# Patient Record
Sex: Female | Born: 1940 | Race: White | Hispanic: No | Smoking: Never smoker
Health system: Southern US, Community
[De-identification: ages and names within clinical notes are randomized; demographics above are authoritative.]

## PROBLEM LIST (undated history)

## (undated) DIAGNOSIS — R0600 Dyspnea, unspecified: Secondary | ICD-10-CM

## (undated) DIAGNOSIS — I2510 Atherosclerotic heart disease of native coronary artery without angina pectoris: Secondary | ICD-10-CM

## (undated) DIAGNOSIS — M1990 Unspecified osteoarthritis, unspecified site: Secondary | ICD-10-CM

## (undated) DIAGNOSIS — K7460 Unspecified cirrhosis of liver: Secondary | ICD-10-CM

## (undated) DIAGNOSIS — J479 Bronchiectasis, uncomplicated: Secondary | ICD-10-CM

## (undated) DIAGNOSIS — E039 Hypothyroidism, unspecified: Secondary | ICD-10-CM

## (undated) DIAGNOSIS — F418 Other specified anxiety disorders: Secondary | ICD-10-CM

## (undated) DIAGNOSIS — Z8639 Personal history of other endocrine, nutritional and metabolic disease: Secondary | ICD-10-CM

## (undated) DIAGNOSIS — R0609 Other forms of dyspnea: Secondary | ICD-10-CM

## (undated) DIAGNOSIS — H269 Unspecified cataract: Secondary | ICD-10-CM

## (undated) DIAGNOSIS — D689 Coagulation defect, unspecified: Secondary | ICD-10-CM

## (undated) DIAGNOSIS — I4891 Unspecified atrial fibrillation: Secondary | ICD-10-CM

## (undated) DIAGNOSIS — I10 Essential (primary) hypertension: Secondary | ICD-10-CM

## (undated) DIAGNOSIS — D500 Iron deficiency anemia secondary to blood loss (chronic): Secondary | ICD-10-CM

## (undated) DIAGNOSIS — K7581 Nonalcoholic steatohepatitis (NASH): Secondary | ICD-10-CM

## (undated) DIAGNOSIS — R58 Hemorrhage, not elsewhere classified: Secondary | ICD-10-CM

## (undated) DIAGNOSIS — Z7901 Long term (current) use of anticoagulants: Secondary | ICD-10-CM

## (undated) DIAGNOSIS — D071 Carcinoma in situ of vulva: Secondary | ICD-10-CM

## (undated) DIAGNOSIS — K5909 Other constipation: Secondary | ICD-10-CM

## (undated) DIAGNOSIS — Z853 Personal history of malignant neoplasm of breast: Secondary | ICD-10-CM

## (undated) DIAGNOSIS — E890 Postprocedural hypothyroidism: Secondary | ICD-10-CM

## (undated) DIAGNOSIS — I480 Paroxysmal atrial fibrillation: Secondary | ICD-10-CM

## (undated) DIAGNOSIS — Z9889 Other specified postprocedural states: Secondary | ICD-10-CM

## (undated) DIAGNOSIS — M810 Age-related osteoporosis without current pathological fracture: Secondary | ICD-10-CM

## (undated) DIAGNOSIS — R112 Nausea with vomiting, unspecified: Secondary | ICD-10-CM

## (undated) DIAGNOSIS — D860 Sarcoidosis of lung: Secondary | ICD-10-CM

## (undated) DIAGNOSIS — D696 Thrombocytopenia, unspecified: Secondary | ICD-10-CM

## (undated) DIAGNOSIS — E119 Type 2 diabetes mellitus without complications: Secondary | ICD-10-CM

## (undated) DIAGNOSIS — C801 Malignant (primary) neoplasm, unspecified: Secondary | ICD-10-CM

## (undated) DIAGNOSIS — E785 Hyperlipidemia, unspecified: Secondary | ICD-10-CM

## (undated) HISTORY — DX: Unspecified cirrhosis of liver: K74.60

## (undated) HISTORY — PX: BREAST SURGERY: SHX581

## (undated) HISTORY — DX: Unspecified cataract: H26.9

## (undated) HISTORY — PX: CHOLECYSTECTOMY: SHX55

## (undated) HISTORY — DX: Essential (primary) hypertension: I10

## (undated) HISTORY — DX: Type 2 diabetes mellitus without complications: E11.9

## (undated) HISTORY — DX: Hypothyroidism, unspecified: E03.9

## (undated) HISTORY — DX: Coagulation defect, unspecified: D68.9

## (undated) HISTORY — DX: Atherosclerotic heart disease of native coronary artery without angina pectoris: I25.10

## (undated) HISTORY — DX: Unspecified atrial fibrillation: I48.91

## (undated) HISTORY — PX: APPENDECTOMY: SHX54

## (undated) HISTORY — DX: Hyperlipidemia, unspecified: E78.5

## (undated) HISTORY — DX: Age-related osteoporosis without current pathological fracture: M81.0

## (undated) HISTORY — DX: Other specified anxiety disorders: F41.8

## (undated) HISTORY — PX: EYE SURGERY: SHX253

## (undated) HISTORY — DX: Hemorrhage, not elsewhere classified: R58

## (undated) HISTORY — PX: COLONOSCOPY WITH ESOPHAGOGASTRODUODENOSCOPY (EGD): SHX5779

## (undated) HISTORY — DX: Malignant (primary) neoplasm, unspecified: C80.1

## (undated) HISTORY — PX: PARTIAL MASTECTOMY WITH AXILLARY SENTINEL LYMPH NODE BIOPSY: SHX6004

## (undated) HISTORY — DX: Nonalcoholic steatohepatitis (NASH): K75.81

## (undated) HISTORY — PX: TUBAL LIGATION: SHX77

---
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
## 1963-12-09 DIAGNOSIS — D869 Sarcoidosis, unspecified: Secondary | ICD-10-CM | POA: Diagnosis present

## 1963-12-09 HISTORY — DX: Sarcoidosis, unspecified: D86.9

## 1985-12-08 HISTORY — PX: CHOLECYSTECTOMY OPEN: SUR202

## 1987-12-09 HISTORY — PX: ABDOMINAL HYSTERECTOMY: SHX81

## 2005-08-27 DIAGNOSIS — I209 Angina pectoris, unspecified: Secondary | ICD-10-CM | POA: Insufficient documentation

## 2005-08-27 DIAGNOSIS — E119 Type 2 diabetes mellitus without complications: Secondary | ICD-10-CM

## 2005-08-27 HISTORY — DX: Type 2 diabetes mellitus without complications: E11.9

## 2007-12-09 HISTORY — PX: LUMBAR LAMINECTOMY: SHX95

## 2011-12-09 DIAGNOSIS — Z923 Personal history of irradiation: Secondary | ICD-10-CM

## 2011-12-09 DIAGNOSIS — C50919 Malignant neoplasm of unspecified site of unspecified female breast: Secondary | ICD-10-CM

## 2011-12-09 DIAGNOSIS — C801 Malignant (primary) neoplasm, unspecified: Secondary | ICD-10-CM

## 2011-12-09 HISTORY — DX: Personal history of irradiation: Z92.3

## 2011-12-09 HISTORY — DX: Malignant (primary) neoplasm, unspecified: C80.1

## 2011-12-09 HISTORY — DX: Malignant neoplasm of unspecified site of unspecified female breast: C50.919

## 2013-12-08 DIAGNOSIS — H269 Unspecified cataract: Secondary | ICD-10-CM

## 2013-12-08 HISTORY — DX: Unspecified cataract: H26.9

## 2014-05-25 DIAGNOSIS — E669 Obesity, unspecified: Secondary | ICD-10-CM | POA: Insufficient documentation

## 2014-12-08 HISTORY — PX: CATARACT EXTRACTION W/ INTRAOCULAR LENS  IMPLANT, BILATERAL: SHX1307

## 2014-12-22 DIAGNOSIS — E0500 Thyrotoxicosis with diffuse goiter without thyrotoxic crisis or storm: Secondary | ICD-10-CM | POA: Insufficient documentation

## 2015-12-09 DIAGNOSIS — Z8639 Personal history of other endocrine, nutritional and metabolic disease: Secondary | ICD-10-CM

## 2015-12-09 HISTORY — DX: Personal history of other endocrine, nutritional and metabolic disease: Z86.39

## 2020-06-14 ENCOUNTER — Emergency Department (HOSPITAL_COMMUNITY): Payer: Medicare Other

## 2020-06-14 ENCOUNTER — Other Ambulatory Visit: Payer: Self-pay

## 2020-06-14 ENCOUNTER — Encounter (HOSPITAL_COMMUNITY): Payer: Self-pay | Admitting: *Deleted

## 2020-06-14 ENCOUNTER — Emergency Department (HOSPITAL_COMMUNITY)
Admission: EM | Admit: 2020-06-14 | Discharge: 2020-06-14 | Disposition: A | Discharge status: Home / Self Care | Payer: Medicare Other | Attending: Emergency Medicine | Admitting: Emergency Medicine

## 2020-06-14 DIAGNOSIS — S61401A Unspecified open wound of right hand, initial encounter: Secondary | ICD-10-CM | POA: Insufficient documentation

## 2020-06-14 DIAGNOSIS — Y92009 Unspecified place in unspecified non-institutional (private) residence as the place of occurrence of the external cause: Secondary | ICD-10-CM | POA: Insufficient documentation

## 2020-06-14 DIAGNOSIS — Y999 Unspecified external cause status: Secondary | ICD-10-CM | POA: Diagnosis not present

## 2020-06-14 DIAGNOSIS — S61501A Unspecified open wound of right wrist, initial encounter: Secondary | ICD-10-CM | POA: Insufficient documentation

## 2020-06-14 DIAGNOSIS — Y939 Activity, unspecified: Secondary | ICD-10-CM | POA: Insufficient documentation

## 2020-06-14 DIAGNOSIS — Z5189 Encounter for other specified aftercare: Secondary | ICD-10-CM

## 2020-06-14 DIAGNOSIS — S6991XA Unspecified injury of right wrist, hand and finger(s), initial encounter: Secondary | ICD-10-CM | POA: Diagnosis present

## 2020-06-14 DIAGNOSIS — M25531 Pain in right wrist: Secondary | ICD-10-CM

## 2020-06-14 DIAGNOSIS — W01198A Fall on same level from slipping, tripping and stumbling with subsequent striking against other object, initial encounter: Secondary | ICD-10-CM | POA: Insufficient documentation

## 2020-06-14 NOTE — ED Triage Notes (Signed)
Wound to right hand from fall 3 weeks ago, here for evaluation

## 2020-06-14 NOTE — ED Provider Notes (Signed)
[facility] EMERGENCY DEPARTMENT Provider Note   CSN: [id number] Arrival date & time: 06/14/20  1342     History Chief Complaint  Patient presents with  . Wound Check    Tasha Roberts is a 79 y.o. female.  HPI   This patient is a pleasant 79 year old female, she is on Eliquis secondary to atrial fibrillation.  She presents to the hospital today for a recheck of her right hand wound.  This occurred approximately 3 weeks ago when she slipped and fell in her house, when her hand hit the carpet it tore the skin on the dorsum of the hand and wrist.  She was initially seen at the urgent care where they sent her to the emergency department, they were able to stop the bleeding and give the patient appropriate wound care instructions.  She is concerned because over the last 3 weeks this seems to be poorly healing, it still occasionally bleeds and feels uncomfortable when she extends and flexes her fingers.  There is no progressive swelling, there is no fevers, the bleeding is completely stopped, she has not been seen in orthopedics in follow-up (has had negative orthopedic x-rays prior to this), she has been applying sterile dressings daily but no other specific wound care.  She is now staying here with a family member and Novella Rob, she is planning on moving down from [place] where she currently lives.  T[place] up-to-date  Past Medical History:  Diagnosis Date  . Atrial fibrillation ([place])   . Cirrhosis ([place])     There are no problems to display for this patient.   Past Surgical History:  Procedure Laterality Date  . CHOLECYSTECTOMY       OB History   No obstetric history on file.     No family history on file.  Social History   Tobacco Use  . Smoking status: Never Smoker  . Smokeless tobacco: Never Used  Substance Use Topics  . Alcohol use: Not Currently  . Drug use: Not Currently    Home Medications Prior to Admission medications   Not on File    Allergies    Metformin  and related  Review of Systems   Review of Systems  Constitutional: Negative for fever.  Skin: Positive for wound.    Physical Exam Updated Vital Signs BP (!) 113/49   Pulse (!) 49   Temp 97.9 F (36.6 C)   Resp 16   SpO2 97%   Physical Exam Vitals and nursing note reviewed.  Constitutional:      Appearance: She is well-developed. She is not diaphoretic.  HENT:     Head: Normocephalic and atraumatic.  Eyes:     General:        Right eye: No discharge.        Left eye: No discharge.     Conjunctiva/sclera: Conjunctivae normal.  Pulmonary:     Effort: Pulmonary effort is normal. No respiratory distress.  Skin:    General: Skin is warm and dry.     Findings: No erythema or rash.     Comments: There is a healing wound on the dorsum of the right hand and wrist.  There is no signs of drainage or discharge, there is a black eschar on top.  There is no surrounding cellulitis induration or warmth.  She is able to range of motion her wrist somewhat but states it causes mild discomfort.  The fingers all appear ecchymotic  Neurological:     Mental Status: She is  alert.     Coordination: Coordination normal.     ED Results / Procedures / Treatments   Labs (all labs ordered are listed, but only abnormal results are displayed) Labs Reviewed - No data to display  EKG None  Radiology DG Wrist Complete Right  Result Date: 06/14/2020 CLINICAL DATA:  Pain following recent fall EXAM: RIGHT WRIST - COMPLETE 3+ VIEW COMPARISON:  None. FINDINGS: Frontal, oblique, and lateral views obtained. Bones are diffusely osteoporotic. No fracture or dislocation. There is narrowing of the scaphotrapezial joint. No erosive change. No soft tissue air. No bony destruction IMPRESSION: Osteoporosis. Narrowing scaphotrapezial joint. No fracture or dislocation. No bony destruction. Electronically Signed   By: Lowella Grip III M.D.   On: 06/14/2020 15:05   DG Hand Complete Right  Result Date:  06/14/2020 CLINICAL DATA:  Pain following recent fall EXAM: RIGHT HAND - COMPLETE 3+ VIEW COMPARISON:  None. FINDINGS: Frontal, oblique, and lateral views were obtained. Bones are osteoporotic. There is no fracture or dislocation. There is moderate narrowing of all PIP and DIP joints with spurring in multiple PIP and DIP joint regions. No erosive change. There is narrowing of the scaphotrapezial joint as well. IMPRESSION: Osteoporosis. No fracture or dislocation. 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 Electronically Signed   By: Lowella Grip III M.D.   On: 06/14/2020 15:04    Procedures Procedures (including critical care time)  Medications Ordered in ED Medications - No data to display  ED Course  I have reviewed the triage vital signs and the nursing notes.  Pertinent labs & imaging results that were available during my care of the patient were reviewed by me and considered in my medical decision making (see chart for details).    MDM Rules/Calculators/A&P                          The patient has a slowly healing wound, she is anticoagulated which is quite evident by the degree of bruising of the arm.  The only place where she has discomfort is over the wrist.  She had initial negative x-rays, will confirm no x-ray evidence of fracture today, as sometimes scaphoid injuries can be missed on initial imaging.  The patient is agreeable, she will need referral to wound care and local family doctors as she plans to move to this area.  Vital signs stable, patient well-appearing, no signs of infection.  xrays viewed and interpreted by myself - there is no fracture - just arthritis Wound care referral given, family doctor referral tgiven, pt agreeable and stable appearing.  Final Clinical Impression(s) / ED Diagnoses Final diagnoses:  Visit for wound check  Wrist pain, acute, right      Noemi Chapel, MD 06/14/20 [phone]

## 2020-06-14 NOTE — Discharge Instructions (Signed)
Tylenol or Ibuprofen for pain (preferrably tylenol) Your xrays show arthritis of your wrist and hand but no broken bones See the wound care clinic above - call to make appointment Keep the wounds clean and dry ER for increased pain / swelling / redness / fever Family doctor list below for local follow up  [facility] Primary Care Doctor List    Sinda Du MD. Specialty: Pulmonary Disease Contact information: [place]  [place] [address]  [phone]   Tula Nakayama, MD. Specialty: Family Medicine Contact information: [address] [place] [address]  [phone]   Sallee Lange, MD. Specialty: Family Medicine Contact information: [place]  [place] [address]  [phone]   Rosita Fire, MD Specialty: Internal Medicine Contact information: [place] [address]  [phone]   Delphina Cahill, MD. Specialty: Internal Medicine Contact information: [place] [address]  [phone]    [facility] (Dr. Maudie Mercury) Specialty: Family Medicine Contact information: [place] [address]  [phone]   Leslie Andrea, MD. Specialty: Family Medicine Contact information: [place] [address]  [phone]   Asencion Noble, MD. Specialty: Internal Medicine Contact information: [place] [address]  [place] [address]  [place]  [address] [place], [place] [address] [phone]  Services [facility] offers a variety of basic health services.  Services include but are not limited to: Blood pressure checks  Heart rate checks  Blood sugar checks  Urine analysis  Rapid strep tests  Pregnancy tests.  Health education and referrals  People needing more complex services will be directed to a physician online. Using  these virtual visits, doctors can evaluate and prescribe medicine and treatments. There will be no medication on-site, though [facility] will help patients fill their prescriptions at little to no cost.   For More information please go to: [email or web address]

## 2020-06-18 ENCOUNTER — Other Ambulatory Visit: Payer: Self-pay

## 2020-06-18 ENCOUNTER — Ambulatory Visit (HOSPITAL_COMMUNITY): Payer: Medicare Other | Attending: Physician Assistant | Admitting: Physical Therapy

## 2020-06-18 ENCOUNTER — Encounter (HOSPITAL_COMMUNITY): Payer: Self-pay | Admitting: Physical Therapy

## 2020-06-18 DIAGNOSIS — M79641 Pain in right hand: Secondary | ICD-10-CM | POA: Diagnosis present

## 2020-06-18 DIAGNOSIS — T8133XA Disruption of traumatic injury wound repair, initial encounter: Secondary | ICD-10-CM | POA: Diagnosis present

## 2020-06-18 NOTE — Therapy (Signed)
[place], [place], [address] Phone: [phone]   Fax:  [phone]  Wound Care Evaluation  Patient Details  Name: Tasha Roberts MRN: [id number] Date of Birth: 06-01-1941 Referring Provider (PT): Glo Herring, [place]   Encounter Date: 06/18/2020   PT End of Session - 06/18/20 1022    Visit Number 1    Number of Visits 12    Date for PT Re-Evaluation 07/30/20    Authorization Type Medicare, secondary - BCBS federal, VL 75 combined, no auth    Authorization - Visit Number 1    Authorization - Number of Visits 75    Progress Note Due on Visit 10    PT Start Time [phone]    PT Stop Time 1015    PT Time Calculation (min) 52 min    Activity Tolerance Patient tolerated treatment well    Behavior During Therapy [facility] for tasks assessed/performed           Past Medical History:  Diagnosis Date  . Atrial fibrillation ([place])   . Cirrhosis [facility])     Past Surgical History:  Procedure Laterality Date  . CHOLECYSTECTOMY      There were no vitals filed for this visit.     [facility] PT Assessment - 06/18/20 0001      Assessment   Medical Diagnosis wound of right hand    Referring Provider (PT) Glo Herring, PA    Onset Date/Surgical Date --   4 weeks ago   Hand Dominance Right    Prior Therapy no      Precautions   Precautions Fall      Balance Screen   Has the patient fallen in the past 6 months Yes    How many times? 1    Has the patient had a decrease in activity level because of a fear of falling?  Yes    Is the patient reluctant to leave their home because of a fear of falling?  No      Home Environment   Living Environment Private residence    Living Arrangements Children    Available Help at Discharge Family    Type of [place]      Prior Function   Level of [place] with household mobility with device;Independent with community mobility with device      Cognition   Overall Cognitive  Status Within Functional Limits for tasks assessed           Wound Therapy - 06/18/20 0001    Subjective Patient and daughter present Tasha Roberts). Patient reports reaching forward toward the ground to pick something up and she fell and her skin is so fragile her hand sustained an opened wound. Went to ED and they tried butterfly bandages and that didn't take. Reports they went back to ED as wound was not healing. Xrays did not show fracture. They have been changing bandage once a day and she is taking Tylenol (pain meds made her really sleepy).     Patient and Family Stated Goals to have wound heal and to be able to use assistive device    Date of Onset --   4 weeks ago   Prior Treatments neosporin, soap and water    Pain Scale 0-10    Pain Score 4     Pain Location Hand    Pain Orientation Right   dorsal   Pain Descriptors / Indicators Aching    Pain Onset With  Activity    Patients Stated Pain Goal 0    Pain Intervention(s) Repositioned    Multiple Pain Sites No    Evaluation and Treatment Procedures Explained to Patient/Family Yes    Evaluation and Treatment Procedures agreed to    Wound Properties Date First Assessed: 06/18/20 Time First Assessed: 0929 Wound Type: Laceration Location: Hand Location Orientation: Right;Other (Comment) Wound Description (Comments): dorsal aspect of hand Present on Admission: Yes   Dressing Type Gauze (Comment);Alginate;Compression wrap   with medihoney, finger and gauze wrap   Dressing Changed New    Dressing Status Old drainage    Dressing Change Frequency PRN    % Wound base Red or Granulating 15%    % Wound base Yellow/Fibrinous Exudate 65%    % Wound base Black/Eschar 20%    Peri-wound Assessment Edema   bruising in fingers and in entire hand   Wound Length (cm) 4.4 cm    Wound Width (cm) 4.5 cm    Wound Depth (cm) 0.6 cm   at 9 o'clock   Wound Volume (cm^3) 11.88 cm^3    Wound Surface Area (cm^2) 19.8 cm^2    Undermining (cm) at 9 o'clock     Margins Epibole (rolled edges)   at 9 o'clock   Drainage Amount Minimal    Drainage Description Sanguineous    Treatment Cleansed;Debridement (Selective)    Selective Debridement - Location right dorsal aspect of hand    Selective Debridement - Tools Used Forceps    Selective Debridement - Tissue Removed devitalized tissue    Wound Therapy - Clinical Statement Patient presents to clinic with right hand wound after falling forward onto her carpet 4 weeks ago. Patient has been to ED  twice and butterfly bandages did not help with wound closure. Swelling and bruising noted throughout right hand and fingers.  Patient is also on Eliquis for A-fib and reports mild to moderate drainage at wound site. Patient would greatly benefit from skilled physical therapy to promote wound healing, decrease risk of infection and allow patient to use required assistive devices safely at home. Used medihoney today to help actively debride wound and decrease sticking to dressing. Alginate was used to help with drainage. Light compression wrap used in fingers and hand to decrease swelling, if this is tolerated will consider light coban around hand to help with swelling.     Wound Therapy - Functional Problem List difficulty walkigna nd using walker/cane, performing daily tasks - right hand dominant    Factors Delaying/Impairing Wound Healing Diabetes Mellitus   on Eliquis, a-fib   Wound Therapy - Frequency 2X / week    Wound Therapy - Current Recommendations PT    Wound Plan continue with selective debridement - precaution- patient is on blood thinners- cleanse and dressing change    Dressing  medihoney on gauze, alginate for absorption, finger wraps, then gauze wrap around hand, netting to secure                  Objective measurements completed on examination: See above findings.             PT Education - 06/18/20 1043    Education Details patient and daughter educated in wound care, POC, compression  and how/when to remove dressings if need be.    Person(s) Educated Patient;Child(ren)    Methods Explanation    Comprehension Verbalized understanding            PT Short Term Goals - 06/18/20 1024  PT SHORT TERM GOAL #1   Title Swelling will be reduced in fingers by at least 50% per patient report to improve healing    Time 3    Period Weeks    Status New    Target Date 07/09/20      PT SHORT TERM GOAL #2   Title Wound will be 100% granulated to improve ability to heal    Time 3    Period Weeks    Status New    Target Date 07/09/20             PT Long Term Goals - 06/18/20 1025      PT LONG TERM GOAL #1   Title Wound will be completely healed to decrease risk of infection    Time 6    Period Weeks    Status New    Target Date 07/30/20      PT LONG TERM GOAL #2   Title Patient will be able to use cane in right hand to improve safety in home to decrease risk of falls.    Time 6    Period Weeks    Status New    Target Date 07/30/20                 Patient will benefit from skilled therapeutic intervention in order to improve the following deficits and impairments:     Visit Diagnosis: Traumatic wound dehiscence, initial encounter  Pain in right hand    Problem List There are no problems to display for this patient.  10:44 AM, 06/18/20 [name], DPT Physical Therapy with [facility]  [phone] office  [place] [address] [place], [place], [address] Phone: [phone]   Fax:  [phone]  Name: Tasha Roberts MRN: [id number] Date of Birth: [birth year]

## 2020-06-20 ENCOUNTER — Encounter (HOSPITAL_COMMUNITY): Payer: Self-pay | Admitting: Physical Therapy

## 2020-06-20 ENCOUNTER — Other Ambulatory Visit: Payer: Self-pay

## 2020-06-20 ENCOUNTER — Ambulatory Visit (HOSPITAL_COMMUNITY): Payer: Medicare Other | Admitting: Physical Therapy

## 2020-06-20 DIAGNOSIS — M79641 Pain in right hand: Secondary | ICD-10-CM

## 2020-06-20 DIAGNOSIS — T8133XA Disruption of traumatic injury wound repair, initial encounter: Secondary | ICD-10-CM | POA: Diagnosis not present

## 2020-06-20 NOTE — Therapy (Signed)
[place], [place], [address] Phone: [phone]   Fax:  [phone]  Wound Care Therapy  Patient Details  Name: Tasha Roberts MRN: [id number] Date of Birth: [birth year] Referring Provider (PT): Glo Herring, [place]   Encounter Date: 06/20/2020   PT End of Session - 06/20/20 1101    Visit Number 2    Number of Visits 12    Date for PT Re-Evaluation 07/30/20    Authorization Type Medicare, secondary - BCBS federal, VL 75 combined, no auth    Authorization - Visit Number 2    Authorization - Number of Visits 75    Progress Note Due on Visit 10    PT Start Time 0920    PT Stop Time 1000    PT Time Calculation (min) 40 min    Activity Tolerance Patient tolerated treatment well    Behavior During Therapy [facility] for tasks assessed/performed           Past Medical History:  Diagnosis Date  . Atrial fibrillation ([place])   . Cirrhosis [facility])     Past Surgical History:  Procedure Laterality Date  . CHOLECYSTECTOMY      There were no vitals filed for this visit.               Wound Therapy - 06/20/20 0001    Subjective Patient reports overall pain has been better since last session. States that she has had minor swelling in one area on her hand that started the day she left here. States occasional tingling at wound bend. Reports that they just noticed the bleeding at the bandage this morning.     Patient and Family Stated Goals to have wound heal and to be able to use assistive device    Prior Treatments neosporin, soap and water    Pain Scale 0-10    Pain Score 2     Pain Location Hand    Pain Orientation Right    Pain Descriptors / Indicators Aching    Pain Onset With Activity    Patients Stated Pain Goal 0    Pain Intervention(s) Repositioned    Multiple Pain Sites No    Evaluation and Treatment Procedures Explained to Patient/Family Yes    Evaluation and Treatment Procedures agreed to    Wound  Properties Date First Assessed: 06/18/20 Time First Assessed: 0929 Wound Type: Laceration Location: Hand Location Orientation: Right;Other (Comment) Wound Description (Comments): dorsal aspect of hand Present on Admission: Yes   Dressing Type Compression wrap;Gauze (Comment)   medihoney on gauze, then gauze wrap, coban and netting    Dressing Changed Changed    Dressing Status Old drainage;New drainage    Dressing Change Frequency PRN    Site / Wound Assessment Bleeding;Granulation tissue;Black;Pink;Yellow;Red    % Wound base Red or Granulating 20%    % Wound base Yellow/Fibrinous Exudate 40%    % Wound base Black/Eschar 15%    % Wound base Other/Granulation Tissue (Comment) 25%    Peri-wound Assessment Edema;Intact    Undermining (cm) at 9 o'clock    Margins Epibole (rolled edges)   at 9 o'clock, unattached elsewhere   Drainage Amount Minimal    Drainage Description Sanguineous;No odor    Treatment Cleansed;Debridement (Selective)    Selective Debridement - Location right dorsal aspect of hand    Selective Debridement - Tools Used Forceps    Selective Debridement - Tissue Removed devitalized tissue    Wound Therapy - Clinical  Statement Improvement in wound bed noted today with decreased necrotic tissue and slough. A couple dark/black spots in wound bed but overall wound is starting to improve in granulation tissue. Minor bleeding noted on bandage but overall drainage not excessive. Vaseline put on periwound to decrease likelihood of maceration. Medihoney helped decrease sticking to dressing. Added coban to hand to promote decrease in swelling (most swelling noted at hand right below ring finger and this started to appear immediately after gauze added). Wrapped ring finger secondary to location of swelling, otherwise swelling in fingers significantly reduced compared to last session. Educated patient in elevation of hand to help promote reduction of swelling.     Wound Therapy - Functional  Problem List difficulty walking and using walker/cane, performing daily tasks - right hand dominant    Factors Delaying/Impairing Wound Healing Diabetes Mellitus;Other (comment)   on blood thinners, a fib   Wound Therapy - Frequency 2X / week    Wound Therapy - Current Recommendations PT    Dressing  medihoney on gauze, alginate for absorption, finger wrap on right ring finger, then gauze wrap around hand, coban around hand for light compression, netting to secure, Vaseline around wound bed                   PT Education - 06/20/20 1134    Education Details in elevation of hand, in how to remove dressing if compression is too much    Person(s) Educated Patient    Methods Explanation    Comprehension Verbalized understanding            PT Short Term Goals - 06/18/20 1024      PT SHORT TERM GOAL #1   Title Swelling will be reduced in fingers by at least 50% per patient report to improve healing    Time 3    Period Weeks    Status New    Target Date 07/09/20      PT SHORT TERM GOAL #2   Title Wound will be 100% granulated to improve ability to heal    Time 3    Period Weeks    Status New    Target Date 07/09/20             PT Long Term Goals - 06/18/20 1025      PT LONG TERM GOAL #1   Title Wound will be completely healed to decrease risk of infection    Time 6    Period Weeks    Status New    Target Date 07/30/20      PT LONG TERM GOAL #2   Title Patient will be able to use cane in right hand to improve safety in home to decrease risk of falls.    Time 6    Period Weeks    Status New    Target Date 07/30/20                 Plan - 06/20/20 1134    Clinical Impression Statement see above    Personal Factors and Comorbidities Age;Comorbidity 1;Comorbidity 2    Comorbidities afib, on blood thinners, DB    Examination-Activity Limitations Bathing;Sleep;Caring for Others;Dressing;Hygiene/Grooming;Toileting;Lift;Locomotion Level     Examination-Participation Restrictions Laundry;Meal Prep;Cleaning    Stability/Clinical Decision Making Stable/Uncomplicated    Rehab Potential Good    PT Frequency 2x / week    PT Duration 6 weeks    PT Treatment/Interventions Aquatic Therapy;Cryotherapy;Electrical Stimulation;Therapeutic exercise;Therapeutic activities;Patient/family education;Dry needling;Compression bandaging;Neuromuscular re-education;Traction;ADLs/Self Care  Home Management;Functional mobility training    PT Next Visit Plan continue with dressing changes    Consulted and Agree with Plan of Care Patient;Family member/caregiver    Family Member Consulted daughter           Patient will benefit from skilled therapeutic intervention in order to improve the following deficits and impairments:  Pain, Decreased strength, Decreased range of motion, Increased edema, Decreased skin integrity, Decreased scar mobility  Visit Diagnosis: Traumatic wound dehiscence, initial encounter  Pain in right hand     Problem List There are no problems to display for this patient.  11:35 AM, 06/20/20 [name], DPT Physical Therapy with [facility]  [phone] office  [place] [address] [place], [place], [address] Phone: [phone]   Fax:  [phone]  Name: Tasha Roberts MRN: [id number] Date of Birth: 12-Mar-1941

## 2020-06-20 NOTE — Addendum Note (Signed)
Addended by: [name] R on: 06/20/2020 11:07 AM   Modules accepted: Orders

## 2020-06-22 ENCOUNTER — Encounter (HOSPITAL_COMMUNITY): Payer: Self-pay | Admitting: Physical Therapy

## 2020-06-22 ENCOUNTER — Ambulatory Visit (HOSPITAL_COMMUNITY): Payer: Medicare Other | Admitting: Physical Therapy

## 2020-06-22 ENCOUNTER — Other Ambulatory Visit: Payer: Self-pay

## 2020-06-22 DIAGNOSIS — T8133XA Disruption of traumatic injury wound repair, initial encounter: Secondary | ICD-10-CM

## 2020-06-22 DIAGNOSIS — M79641 Pain in right hand: Secondary | ICD-10-CM

## 2020-06-22 NOTE — Therapy (Signed)
[place], [place], [address] Phone: [phone]   Fax:  [phone]  Wound Care Therapy  Patient Details  Name: Tasha Roberts MRN: [id number] Date of Birth: 1941/01/12 Referring Provider (PT): Glo Herring, [place]   Encounter Date: 06/22/2020   PT End of Session - 06/22/20 1632    Visit Number 3    Number of Visits 12    Date for PT Re-Evaluation 07/30/20    Authorization Type Medicare, secondary - BCBS federal, VL 75 combined, no auth    Authorization - Visit Number 3    Authorization - Number of Visits 75    Progress Note Due on Visit 10    PT Start Time 1530    PT Stop Time 1610    PT Time Calculation (min) 40 min           Past Medical History:  Diagnosis Date  . Atrial fibrillation ([place])   . Cirrhosis [facility])     Past Surgical History:  Procedure Laterality Date  . CHOLECYSTECTOMY      There were no vitals filed for this visit.               Wound Therapy - 06/22/20 0001    Subjective PT states that her fingers that were not bandaged has had increased edema and she noticed that she has increased drainage so she as concerned.     Patient and Family Stated Goals to have wound heal and to be able to use assistive device    Prior Treatments neosporin, soap and water    Pain Scale 0-10    Pain Score 4     Pain Location Hand    Pain Orientation Right    Pain Descriptors / Indicators Aching    Pain Onset With Activity    Patients Stated Pain Goal 0    Pain Intervention(s) Emotional support    Multiple Pain Sites No    Evaluation and Treatment Procedures Explained to Patient/Family Yes    Evaluation and Treatment Procedures agreed to    Wound Properties Date First Assessed: 06/18/20 Time First Assessed: 0929 Wound Type: Laceration Location: Hand Location Orientation: Right;Other (Comment) Wound Description (Comments): dorsal aspect of hand Present on Admission: Yes   Dressing Type Compression  wrap    Dressing Changed Changed    Dressing Status Old drainage    Dressing Change Frequency PRN    Site / Wound Assessment Granulation tissue;Painful;Yellow    % Wound base Red or Granulating 70%    % Wound base Yellow/Fibrinous Exudate 30%    Peri-wound Assessment Edema    Drainage Amount Minimal    Drainage Description Purulent;No odor    Treatment Cleansed;Debridement (Selective)    Selective Debridement - Location right dorsal aspect of hand    Selective Debridement - Tools Used Forceps    Selective Debridement - Tissue Removed devitalized tissue    Wound Therapy - Clinical Statement Pt concerned about purulent drainage however wound has significant improved granulation.  Explained to pt that as the slough comes off the wound it has to go somewhere and that is what she is seeing, there is not redness, heat or significant pain to indicate any type of infection.  Noted increased edema in fingers that were not bandaged this treatment.  Pt concerned about getting wound dirty as she states that she is very Rt hand dominant.  Therapist changed dressing to finger bandaging, 1/4 " foam and 4cm short stretch  bandaging to both control edema and cover wound.     Wound Therapy - Functional Problem List difficulty walking and using walker/cane, performing daily tasks - right hand dominant    Factors Delaying/Impairing Wound Healing Diabetes Mellitus;Other (comment)   on blood thinners, a fib   Wound Therapy - Frequency 2X / week    Wound Therapy - Current Recommendations PT    Dressing  xeroform, alginate , foam , finger bandaging and short stretch bandaging.                      PT Short Term Goals - 06/18/20 1024      PT SHORT TERM GOAL #1   Title Swelling will be reduced in fingers by at least 50% per patient report to improve healing    Time 3    Period Weeks    Status New    Target Date 07/09/20      PT SHORT TERM GOAL #2   Title Wound will be 100% granulated to improve  ability to heal    Time 3    Period Weeks    Status New    Target Date 07/09/20             PT Long Term Goals - 06/18/20 1025      PT LONG TERM GOAL #1   Title Wound will be completely healed to decrease risk of infection    Time 6    Period Weeks    Status New    Target Date 07/30/20      PT LONG TERM GOAL #2   Title Patient will be able to use cane in right hand to improve safety in home to decrease risk of falls.    Time 6    Period Weeks    Status New    Target Date 07/30/20                 Plan - 06/22/20 1633    Clinical Impression Statement see above    Personal Factors and Comorbidities Age;Comorbidity 1;Comorbidity 2    Comorbidities afib, on blood thinners, DB    Examination-Activity Limitations Bathing;Sleep;Caring for Others;Dressing;Hygiene/Grooming;Toileting;Lift;Locomotion Level    Examination-Participation Restrictions Laundry;Meal Prep;Cleaning    Stability/Clinical Decision Making Stable/Uncomplicated    Rehab Potential Good    PT Frequency 2x / week    PT Duration 6 weeks    PT Treatment/Interventions Aquatic Therapy;Cryotherapy;Electrical Stimulation;Therapeutic exercise;Therapeutic activities;Patient/family education;Dry needling;Compression bandaging;Neuromuscular re-education;Traction;ADLs/Self Care Home Management;Functional mobility training    PT Next Visit Plan continue with dressing changes    Consulted and Agree with Plan of Care Patient;Family member/caregiver    Family Member Consulted daughter           Patient will benefit from skilled therapeutic intervention in order to improve the following deficits and impairments:  Pain, Decreased strength, Decreased range of motion, Increased edema, Decreased skin integrity, Decreased scar mobility  Visit Diagnosis: Traumatic wound dehiscence, initial encounter  Pain in right hand     Problem List There are no problems to display for this patient.   [name], PT  CLT [phone] 06/22/2020, 4:34 PM  [place] [address] [place], [place], [address] Phone: [phone]   Fax:  [phone]  Name: Tasha Roberts MRN: [id number] Date of Birth: 1941-04-11

## 2020-06-26 ENCOUNTER — Encounter (HOSPITAL_COMMUNITY): Payer: Self-pay | Admitting: Physical Therapy

## 2020-06-26 ENCOUNTER — Other Ambulatory Visit: Payer: Self-pay

## 2020-06-26 ENCOUNTER — Ambulatory Visit (HOSPITAL_COMMUNITY): Payer: Medicare Other | Admitting: Physical Therapy

## 2020-06-26 DIAGNOSIS — M79641 Pain in right hand: Secondary | ICD-10-CM

## 2020-06-26 DIAGNOSIS — T8133XA Disruption of traumatic injury wound repair, initial encounter: Secondary | ICD-10-CM | POA: Diagnosis not present

## 2020-06-26 NOTE — Therapy (Signed)
[place], [place], [address] Phone: [phone]   Fax:  [phone]  Wound Care Therapy  Patient Details  Name: Tasha Roberts MRN: [id number] Date of Birth: 10-Jan-1941 Referring Provider (PT): Glo Herring, [place]   Encounter Date: 06/26/2020   PT End of Session - 06/26/20 1007    Visit Number 4    Number of Visits 12    Date for PT Re-Evaluation 07/30/20    Authorization Type Medicare, secondary - BCBS federal, VL 75 combined, no auth    Authorization - Visit Number 4    Authorization - Number of Visits 75    Progress Note Due on Visit 10    PT Start Time 1003    PT Stop Time 1043    PT Time Calculation (min) 40 min           Past Medical History:  Diagnosis Date  . Atrial fibrillation ([place])   . Cirrhosis [facility])     Past Surgical History:  Procedure Laterality Date  . CHOLECYSTECTOMY      There were no vitals filed for this visit.      [facility] PT Assessment - 06/26/20 0001      Assessment   Medical Diagnosis wound of right hand    Referring Provider (PT) Glo Herring, PA    Hand Dominance Right                   Wound Therapy - 06/26/20 0001    Subjective States that her hand hasn't been very painful just in her fingers and just when she tries to move her fingers but overall her pain is much better and the swelling is much better.     Patient and Family Stated Goals to have wound heal and to be able to use assistive device    Prior Treatments neosporin, soap and water    Pain Scale 0-10    Pain Score 2     Pain Location Hand    Pain Descriptors / Indicators Aching    Pain Onset With Activity    Pain Intervention(s) Emotional support    Multiple Pain Sites No    Evaluation and Treatment Procedures Explained to Patient/Family Yes    Evaluation and Treatment Procedures agreed to    Wound Properties Date First Assessed: 06/18/20 Time First Assessed: 0929 Wound Type: Laceration Location:  Hand Location Orientation: Right;Other (Comment) Wound Description (Comments): dorsal aspect of hand Present on Admission: Yes   Dressing Type Foam - Lift dressing to assess site every shift;Compression wrap;Impregnated gauze (bismuth);Gauze (Comment);Alginate    Dressing Changed Changed    Dressing Status Old drainage    Dressing Change Frequency PRN    Site / Wound Assessment Granulation tissue;Pink    % Wound base Red or Granulating 95%    % Wound base Yellow/Fibrinous Exudate 5%    Peri-wound Assessment Edema;Maceration;Pink    Wound Length (cm) 3.6 cm   was 4.4   Wound Width (cm) 3 cm   was 4.5   Wound Depth (cm) 0 cm   .6   Wound Volume (cm^3) 0 cm^3    Wound Surface Area (cm^2) 10.8 cm^2    Margins Unattached edges (unapproximated)    Drainage Amount Minimal    Drainage Description Serosanguineous    Treatment Cleansed;Debridement (Selective)    Selective Debridement - Location right dorsal aspect of hand    Selective Debridement - Tools Used Forceps    Selective Debridement -  Tissue Removed devitalized tissue    Wound Therapy - Clinical Statement Wound improved in granulation tissue depth and overall size compared to last session and last measurement. Minimal drainage noted but continued swelling noted. Continued with xeroform, foam, gauze and light compression and finger wraps. Decreased pain noted during debridement and after dressing changed will continue with current POC.     Wound Therapy - Functional Problem List difficulty walking and using walker/cane, performing daily tasks - right hand dominant    Factors Delaying/Impairing Wound Healing Diabetes Mellitus;Other (comment)    Wound Therapy - Frequency 2X / week    Wound Therapy - Current Recommendations PT    Wound Plan continue with selective debridement - precaution- patient is on blood thinners- cleanse and dressing change    Dressing  xeroform, alginate , foam , finger bandaging and short stretch bandaging.                        PT Short Term Goals - 06/18/20 1024      PT SHORT TERM GOAL #1   Title Swelling will be reduced in fingers by at least 50% per patient report to improve healing    Time 3    Period Weeks    Status New    Target Date 07/09/20      PT SHORT TERM GOAL #2   Title Wound will be 100% granulated to improve ability to heal    Time 3    Period Weeks    Status New    Target Date 07/09/20             PT Long Term Goals - 06/18/20 1025      PT LONG TERM GOAL #1   Title Wound will be completely healed to decrease risk of infection    Time 6    Period Weeks    Status New    Target Date 07/30/20      PT LONG TERM GOAL #2   Title Patient will be able to use cane in right hand to improve safety in home to decrease risk of falls.    Time 6    Period Weeks    Status New    Target Date 07/30/20                  Patient will benefit from skilled therapeutic intervention in order to improve the following deficits and impairments:     Visit Diagnosis: Traumatic wound dehiscence, initial encounter  Pain in right hand     Problem List There are no problems to display for this patient.  11:20 AM, 06/26/20 [name], DPT Physical Therapy with [facility]  [phone] office  [place] [address] [place], [place], [address] Phone: [phone]   Fax:  [phone]  Name: Carrissa Taitano MRN: [id number] Date of Birth: [birth year]

## 2020-06-27 ENCOUNTER — Ambulatory Visit (HOSPITAL_COMMUNITY): Payer: Medicare Other | Admitting: Physical Therapy

## 2020-06-28 ENCOUNTER — Other Ambulatory Visit: Payer: Self-pay

## 2020-06-28 ENCOUNTER — Ambulatory Visit (HOSPITAL_COMMUNITY): Payer: Medicare Other | Admitting: Physical Therapy

## 2020-06-28 DIAGNOSIS — M79641 Pain in right hand: Secondary | ICD-10-CM

## 2020-06-28 DIAGNOSIS — T8133XA Disruption of traumatic injury wound repair, initial encounter: Secondary | ICD-10-CM | POA: Diagnosis not present

## 2020-06-28 NOTE — Therapy (Signed)
[place], [place], [address] Phone: [phone]   Fax:  [phone]  Wound Care Therapy  Patient Details  Name: Tasha Roberts MRN: [id number] Date of Birth: Aug 18, 1941 Referring Provider (PT): Glo Herring, [place]   Encounter Date: 06/28/2020   PT End of Session - 06/28/20 1221    Visit Number 5    Number of Visits 12    Date for PT Re-Evaluation 07/30/20    Authorization Type Medicare, secondary - BCBS federal, VL 75 combined, no auth    Authorization - Visit Number 5    Authorization - Number of Visits 75    Progress Note Due on Visit 10    PT Start Time 1140    PT Stop Time 1208    PT Time Calculation (min) 28 min    Activity Tolerance Patient tolerated treatment well    Behavior During Therapy [facility] for tasks assessed/performed           Past Medical History:  Diagnosis Date  . Atrial fibrillation ([place])   . Cirrhosis [facility])     Past Surgical History:  Procedure Laterality Date  . CHOLECYSTECTOMY      There were no vitals filed for this visit.               Wound Therapy - 06/28/20 0001    Subjective PT states that her hand doesn't even hurt anylonger.     Patient and Family Stated Goals to have wound heal and to be able to use assistive device    Prior Treatments neosporin, soap and water    Pain Scale 0-10    Pain Score 0-No pain    Evaluation and Treatment Procedures Explained to Patient/Family Yes    Evaluation and Treatment Procedures agreed to    Wound Properties Date First Assessed: 06/18/20 Time First Assessed: 0929 Wound Type: Laceration Location: Hand Location Orientation: Right;Other (Comment) Wound Description (Comments): dorsal aspect of hand Present on Admission: Yes   Dressing Type Alginate;Compression wrap;Impregnated gauze (bismuth)    Dressing Changed Changed    Dressing Status Old drainage    Dressing Change Frequency PRN    Site / Wound Assessment Clean;Red    % Wound  base Red or Granulating 95%    % Wound base Yellow/Fibrinous Exudate 5%    Margins Epibole (rolled edges)    Drainage Amount Minimal    Drainage Description Serosanguineous    Treatment Cleansed;Debridement (Selective)    Selective Debridement - Location right dorsal aspect of hand    Selective Debridement - Tools Used Forceps    Selective Debridement - Tissue Removed biofilm and dead skin    Wound Therapy - Clinical Statement Wound continues to improve, alginate was dry therefor discontinued.  Edema in hand has decreased therefore trial of discontinuing both the finger wraps and the compression dressing.     Wound Therapy - Functional Problem List difficulty walking and using walker/cane, performing daily tasks - right hand dominant    Factors Delaying/Impairing Wound Healing Diabetes Mellitus;Other (comment)    Wound Therapy - Frequency 2X / week    Wound Therapy - Current Recommendations PT    Wound Plan continue with selective debridement; gentle debriding of epiboled edges  - precaution- patient is on blood thinners- cleanse and dressing change    Dressing  xeroform, 4x4, 3"kling and netting                     PT  Short Term Goals - 06/28/20 1222      PT SHORT TERM GOAL #1   Title Swelling will be reduced in fingers by at least 50% per patient report to improve healing    Time 3    Period Weeks    Status Achieved    Target Date 07/09/20      PT SHORT TERM GOAL #2   Title Wound will be 100% granulated to improve ability to heal    Time 3    Period Weeks    Status On-going    Target Date 07/09/20             PT Long Term Goals - 06/28/20 1222      PT LONG TERM GOAL #1   Title Wound will be completely healed to decrease risk of infection    Time 6    Period Weeks    Status On-going      PT LONG TERM GOAL #2   Title Patient will be able to use cane in right hand to improve safety in home to decrease risk of falls.    Time 6    Period Weeks    Status  On-going                 Plan - 06/28/20 1221    Clinical Impression Statement see above    Personal Factors and Comorbidities Age;Comorbidity 1;Comorbidity 2    Comorbidities afib, on blood thinners, DB    Examination-Activity Limitations Bathing;Sleep;Caring for Others;Dressing;Hygiene/Grooming;Toileting;Lift;Locomotion Level    Examination-Participation Restrictions Laundry;Meal Prep;Cleaning    Stability/Clinical Decision Making Stable/Uncomplicated    Rehab Potential Good    PT Frequency 2x / week    PT Duration 6 weeks    PT Treatment/Interventions Aquatic Therapy;Cryotherapy;Electrical Stimulation;Therapeutic exercise;Therapeutic activities;Patient/family education;Dry needling;Compression bandaging;Neuromuscular re-education;Traction;ADLs/Self Care Home Management;Functional mobility training    PT Next Visit Plan continue with debridement and dressing changes    Consulted and Agree with Plan of Care Patient;Family member/caregiver    Family Member Consulted daughter           Patient will benefit from skilled therapeutic intervention in order to improve the following deficits and impairments:  Pain, Decreased strength, Decreased range of motion, Increased edema, Decreased skin integrity, Decreased scar mobility  Visit Diagnosis: Pain in right hand  Traumatic wound dehiscence, initial encounter     Problem List There are no problems to display for this patient.   Tasha Roberts, PT CLT [phone] 06/28/2020, 12:22 PM  [place] [address] [place], [place], [address] Phone: [phone]   Fax:  [phone]  Name: Tasha Roberts MRN: [id number] Date of Birth: June 09, 1941

## 2020-07-04 ENCOUNTER — Other Ambulatory Visit: Payer: Self-pay

## 2020-07-04 ENCOUNTER — Ambulatory Visit (HOSPITAL_COMMUNITY): Payer: Medicare Other | Admitting: Physical Therapy

## 2020-07-04 DIAGNOSIS — M79641 Pain in right hand: Secondary | ICD-10-CM

## 2020-07-04 DIAGNOSIS — T8133XA Disruption of traumatic injury wound repair, initial encounter: Secondary | ICD-10-CM

## 2020-07-04 NOTE — Therapy (Signed)
[place], [place], [address] Phone: [phone]   Fax:  [phone]  Wound Care Therapy  Patient Details  Name: Tasha Roberts MRN: [id number] Date of Birth: 1941-08-05 Referring Provider (PT): Glo Herring, [place]   Encounter Date: 07/04/2020   PT End of Session - 07/04/20 1642    Visit Number 6    Number of Visits 12    Date for PT Re-Evaluation 07/30/20    Authorization Type Medicare, secondary - BCBS federal, VL 75 combined, no auth    Authorization - Visit Number 6    Authorization - Number of Visits 75    Progress Note Due on Visit 10    PT Start Time 1535    PT Stop Time 1600    PT Time Calculation (min) 25 min    Activity Tolerance Patient tolerated treatment well    Behavior During Therapy [facility] for tasks assessed/performed           Past Medical History:  Diagnosis Date  . Atrial fibrillation ([place])   . Cirrhosis [facility])     Past Surgical History:  Procedure Laterality Date  . CHOLECYSTECTOMY      There were no vitals filed for this visit.               Wound Therapy - 07/04/20 1635    Subjective pt states she has been using her hand more and there has been so seeping through drainage    Patient and Family Stated Goals to have wound heal and to be able to use assistive device    Prior Treatments neosporin, soap and water    Pain Scale 0-10    Pain Score 0-No pain    Evaluation and Treatment Procedures Explained to Patient/Family Yes    Evaluation and Treatment Procedures agreed to    Wound Properties Date First Assessed: 06/18/20 Time First Assessed: 0929 Wound Type: Laceration Location: Hand Location Orientation: Right;Other (Comment) Wound Description (Comments): dorsal aspect of hand Present on Admission: Yes   Dressing Type Impregnated gauze (bismuth);Gauze (Comment)    Dressing Changed Changed    Dressing Status Old drainage    Dressing Change Frequency PRN    Site / Wound  Assessment Red;Clean    % Wound base Red or Granulating 100%    % Wound base Yellow/Fibrinous Exudate 0%    Margins Attached edges (approximated)    Drainage Amount Scant    Drainage Description Serosanguineous    Treatment Cleansed;Debridement (Selective)    Selective Debridement - Location right dorsal aspect of hand    Selective Debridement - Tools Used Forceps    Selective Debridement - Tissue Removed biofilm and dead skin    Wound Therapy - Clinical Statement much improved today with approximate edges and 100% granulation following debridement. cleansed wound well and moisturized edeges of wound with vaseline prior to applying xeroform, gauze and conform to hand    Wound Therapy - Functional Problem List difficulty walking and using walker/cane, performing daily tasks - right hand dominant    Factors Delaying/Impairing Wound Healing Diabetes Mellitus;Other (comment)    Wound Therapy - Frequency 2X / week    Wound Therapy - Current Recommendations PT    Wound Plan continue with selective debridement; gentle debriding of epiboled edges  - precaution- patient is on blood thinners- cleanse and dressing change.  measure next session (Fridays)    Dressing  xeroform, 4x4, 3"conform and netting  PT Short Term Goals - 06/28/20 1222      PT SHORT TERM GOAL #1   Title Swelling will be reduced in fingers by at least 50% per patient report to improve healing    Time 3    Period Weeks    Status Achieved    Target Date 07/09/20      PT SHORT TERM GOAL #2   Title Wound will be 100% granulated to improve ability to heal    Time 3    Period Weeks    Status On-going    Target Date 07/09/20             PT Long Term Goals - 06/28/20 1222      PT LONG TERM GOAL #1   Title Wound will be completely healed to decrease risk of infection    Time 6    Period Weeks    Status On-going      PT LONG TERM GOAL #2   Title Patient will be able to use cane in right  hand to improve safety in home to decrease risk of falls.    Time 6    Period Weeks    Status On-going                  Patient will benefit from skilled therapeutic intervention in order to improve the following deficits and impairments:     Visit Diagnosis: Pain in right hand  Traumatic wound dehiscence, initial encounter     Problem List There are no problems to display for this patient.  Teena Irani, PTA/CLT [phone]  Teena Irani 07/04/2020, 4:44 PM  [place] [address] [place], [place], [address] Phone: [phone]   Fax:  [phone]  Name: Tasha Roberts MRN: [id number] Date of Birth: 03/14/1941

## 2020-07-05 ENCOUNTER — Ambulatory Visit (HOSPITAL_COMMUNITY): Payer: Medicare Other | Admitting: Physical Therapy

## 2020-07-06 ENCOUNTER — Other Ambulatory Visit: Payer: Self-pay

## 2020-07-06 ENCOUNTER — Encounter (HOSPITAL_COMMUNITY): Payer: Self-pay | Admitting: Physical Therapy

## 2020-07-06 ENCOUNTER — Ambulatory Visit (HOSPITAL_COMMUNITY): Payer: Medicare Other | Admitting: Physical Therapy

## 2020-07-06 DIAGNOSIS — T8133XA Disruption of traumatic injury wound repair, initial encounter: Secondary | ICD-10-CM

## 2020-07-06 DIAGNOSIS — M79641 Pain in right hand: Secondary | ICD-10-CM

## 2020-07-06 NOTE — Therapy (Signed)
[place], [place], [address] Phone: [phone]   Fax:  [phone]  Wound Care Therapy  Patient Details  Name: Tasha Roberts MRN: [id number] Date of Birth: 05/28/1941 Referring Provider (PT): Glo Herring, [place]   Encounter Date: 07/06/2020   PT End of Session - 07/06/20 0948    Visit Number 7    Number of Visits 12    Date for PT Re-Evaluation 07/30/20    Authorization Type Medicare, secondary - BCBS federal, VL 75 combined, no auth    Authorization - Visit Number 7    Authorization - Number of Visits 75    Progress Note Due on Visit 10    PT Start Time 0920    PT Stop Time 0945    PT Time Calculation (min) 25 min    Activity Tolerance Patient tolerated treatment well    Behavior During Therapy [facility] for tasks assessed/performed           Past Medical History:  Diagnosis Date  . Atrial fibrillation ([place])   . Cirrhosis [facility])     Past Surgical History:  Procedure Laterality Date  . CHOLECYSTECTOMY      There were no vitals filed for this visit.      [facility] PT Assessment - 07/06/20 0001      Assessment   Medical Diagnosis wound of right hand    Referring Provider (PT) Glo Herring, PA                   Wound Therapy - 07/06/20 0001    Subjective States she has not had any pain. Has been putting a glove on her hand to take showers.    Patient and Family Stated Goals to have wound heal and to be able to use assistive device    Prior Treatments neosporin, soap and water    Pain Scale 0-10    Pain Score 0-No pain    Evaluation and Treatment Procedures Explained to Patient/Family Yes    Evaluation and Treatment Procedures agreed to    Wound Properties Date First Assessed: 06/18/20 Time First Assessed: 0929 Wound Type: Laceration Location: Hand Location Orientation: Right;Other (Comment) Wound Description (Comments): dorsal aspect of hand Present on Admission: Yes   Dressing Type Impregnated  gauze (bismuth);Gauze (Comment)    Dressing Changed Changed    Dressing Status Old drainage    Dressing Change Frequency PRN    Site / Wound Assessment Red;Clean    % Wound base Red or Granulating 100%    % Wound base Yellow/Fibrinous Exudate 0%    Peri-wound Assessment Pink    Wound Length (cm) 2.6 cm   was 3.6   Wound Width (cm) 1.2 cm   was 3.0   Wound Depth (cm) 0 cm    Wound Volume (cm^3) 0 cm^3    Wound Surface Area (cm^2) 3.12 cm^2    Margins Unattached edges (unapproximated)    Drainage Amount Scant    Drainage Description Serous    Treatment Cleansed;Debridement (Selective)    Selective Debridement - Location right dorsal aspect of hand    Selective Debridement - Tools Used Forceps    Selective Debridement - Tissue Removed biofilm and dead skin    Wound Therapy - Clinical Statement Wound continues to improve. All short term goals met at this time. No swelling noted. Wound continues to reduce in size and is 100% granulated at this time. Continued with xeroform, gauze roll and netting to  secure. Will continue with current wound care plan.     Wound Therapy - Functional Problem List difficulty walking and using walker/cane, performing daily tasks - right hand dominant    Factors Delaying/Impairing Wound Healing Diabetes Mellitus;Other (comment)    Wound Therapy - Frequency 2X / week    Wound Therapy - Current Recommendations PT    Wound Plan continue with selective debridement; gentle debriding of epiboled edges  - precaution- patient is on blood thinners- cleanse and dressing change.  measure next session (Fridays)    Dressing  xeroform, 4x4, 3"gauze roll and netting                     PT Short Term Goals - 07/06/20 0949      PT SHORT TERM GOAL #1   Title Swelling will be reduced in fingers by at least 50% per patient report to improve healing    Time 3    Period Weeks    Status Achieved    Target Date 07/09/20      PT SHORT TERM GOAL #2   Title Wound will  be 100% granulated to improve ability to heal    Time 3    Period Weeks    Status Achieved    Target Date 07/09/20             PT Long Term Goals - 06/28/20 1222      PT LONG TERM GOAL #1   Title Wound will be completely healed to decrease risk of infection    Time 6    Period Weeks    Status On-going      PT LONG TERM GOAL #2   Title Patient will be able to use cane in right hand to improve safety in home to decrease risk of falls.    Time 6    Period Weeks    Status On-going                 Plan - 07/06/20 0948    Clinical Impression Statement see above    Personal Factors and Comorbidities Age;Comorbidity 1;Comorbidity 2    Comorbidities afib, on blood thinners, DB    Examination-Activity Limitations Bathing;Sleep;Caring for Others;Dressing;Hygiene/Grooming;Toileting;Lift;Locomotion Level    Examination-Participation Restrictions Laundry;Meal Prep;Cleaning    Stability/Clinical Decision Making Stable/Uncomplicated    Rehab Potential Good    PT Frequency 2x / week    PT Duration 6 weeks    PT Treatment/Interventions Aquatic Therapy;Cryotherapy;Electrical Stimulation;Therapeutic exercise;Therapeutic activities;Patient/family education;Dry needling;Compression bandaging;Neuromuscular re-education;Traction;ADLs/Self Care Home Management;Functional mobility training    PT Next Visit Plan continue with dressing changes    Consulted and Agree with Plan of Care Patient;Family member/caregiver    Family Member Consulted daughter           Patient will benefit from skilled therapeutic intervention in order to improve the following deficits and impairments:  Pain, Decreased strength, Decreased range of motion, Increased edema, Decreased skin integrity, Decreased scar mobility  Visit Diagnosis: Pain in right hand  Traumatic wound dehiscence, initial encounter     Problem List There are no problems to display for this patient.  9:53 AM, 07/06/20 [name], DPT Physical Therapy with [facility]  [phone] office  [place] [address] [place], [place], [address] Phone: [phone]   Fax:  [phone]  Name: Tasha Roberts MRN: [id number] Date of Birth: [birth year]

## 2020-07-10 ENCOUNTER — Other Ambulatory Visit: Payer: Self-pay

## 2020-07-10 ENCOUNTER — Encounter (HOSPITAL_COMMUNITY): Payer: Self-pay | Admitting: Physical Therapy

## 2020-07-10 ENCOUNTER — Ambulatory Visit (HOSPITAL_COMMUNITY): Payer: Medicare Other | Attending: Physician Assistant | Admitting: Physical Therapy

## 2020-07-10 DIAGNOSIS — T8133XA Disruption of traumatic injury wound repair, initial encounter: Secondary | ICD-10-CM | POA: Insufficient documentation

## 2020-07-10 DIAGNOSIS — M79641 Pain in right hand: Secondary | ICD-10-CM | POA: Diagnosis not present

## 2020-07-10 NOTE — Therapy (Signed)
[place], [place], [address] Phone: [phone]   Fax:  [phone]  Wound Care Therapy  Patient Details  Name: Tasha Roberts MRN: [id number] Date of Birth: March 30, 1941 Referring Provider (PT): Glo Herring, [place]   Encounter Date: 07/10/2020   PT End of Session - 07/10/20 1149    Visit Number 8    Number of Visits 12    Date for PT Re-Evaluation 07/30/20    Authorization Type Medicare, secondary - BCBS federal, VL 75 combined, no auth    Authorization - Visit Number 8    Authorization - Number of Visits 75    Progress Note Due on Visit 10    PT Start Time 1123    PT Stop Time 1140    PT Time Calculation (min) 17 min    Activity Tolerance Patient tolerated treatment well    Behavior During Therapy WFL for tasks assessed/performed           Past Medical History:  Diagnosis Date   Atrial fibrillation ([place])    Cirrhosis ([place])     Past Surgical History:  Procedure Laterality Date   CHOLECYSTECTOMY      There were no vitals filed for this visit.      [facility] PT Assessment - 07/10/20 0001      Assessment   Medical Diagnosis wound of right hand    Referring Provider (PT) Glo Herring, PA                   Wound Therapy - 07/10/20 0001    Subjective no pain or swelling noted, bandage intact.    Patient and Family Stated Goals to have wound heal and to be able to use assistive device    Prior Treatments neosporin, soap and water    Pain Scale 0-10    Pain Score 0-No pain    Pain Type Acute pain    Evaluation and Treatment Procedures Explained to Patient/Family Yes    Evaluation and Treatment Procedures agreed to    Wound Properties Date First Assessed: 06/18/20 Time First Assessed: 0929 Wound Type: Laceration Location: Hand Location Orientation: Right;Other (Comment) Wound Description (Comments): dorsal aspect of hand Present on Admission: Yes   Dressing Type Impregnated gauze (bismuth);Gauze  (Comment)    Dressing Changed Changed    Dressing Status None    Dressing Change Frequency PRN    Site / Wound Assessment Pink;Granulation tissue    % Wound base Red or Granulating 100%    % Wound base Yellow/Fibrinous Exudate 0%    Peri-wound Assessment Pink    Margins Unattached edges (unapproximated)    Drainage Amount None    Treatment Cleansed;Debridement (Selective)    Selective Debridement - Location right dorsal aspect of hand    Selective Debridement - Tools Used Forceps    Selective Debridement - Tissue Removed biofilm and dead skin    Wound Therapy - Clinical Statement Session focused on continued activation of TRA and glute musculature. Verbal and tactile cues required. Continued difficulties noted but improved isometric contraction noted. Difficult for patient to activate hip musculature with sit to stand. Will follow up with this next session.      Wound Therapy - Functional Problem List difficulty walking and using walker/cane, performing daily tasks - right hand dominant    Factors Delaying/Impairing Wound Healing Diabetes Mellitus;Other (comment)    Wound Therapy - Frequency 2X / week    Wound Therapy - Current Recommendations PT  Wound Plan continue with selective debridement; gentle debriding of epiboled edges  - precaution- patient is on blood thinners- cleanse and dressing change.  measure next session (Fridays)    Dressing  xeroform, 4x4, 3"gauze roll, foam and netting                     PT Short Term Goals - 07/06/20 0949      PT SHORT TERM GOAL #1   Title Swelling will be reduced in fingers by at least 50% per patient report to improve healing    Time 3    Period Weeks    Status Achieved    Target Date 07/09/20      PT SHORT TERM GOAL #2   Title Wound will be 100% granulated to improve ability to heal    Time 3    Period Weeks    Status Achieved    Target Date 07/09/20             PT Long Term Goals - 06/28/20 1222      PT LONG TERM  GOAL #1   Title Wound will be completely healed to decrease risk of infection    Time 6    Period Weeks    Status On-going      PT LONG TERM GOAL #2   Title Patient will be able to use cane in right hand to improve safety in home to decrease risk of falls.    Time 6    Period Weeks    Status On-going                 Plan - 07/10/20 1149    Clinical Impression Statement see above    Personal Factors and Comorbidities Age;Comorbidity 1;Comorbidity 2    Comorbidities afib, on blood thinners, DB    Examination-Activity Limitations Bathing;Sleep;Caring for Others;Dressing;Hygiene/Grooming;Toileting;Lift;Locomotion Level    Examination-Participation Restrictions Laundry;Meal Prep;Cleaning    Stability/Clinical Decision Making Stable/Uncomplicated    Rehab Potential Good    PT Frequency 2x / week    PT Duration 6 weeks    PT Treatment/Interventions Aquatic Therapy;Cryotherapy;Electrical Stimulation;Therapeutic exercise;Therapeutic activities;Patient/family education;Dry needling;Compression bandaging;Neuromuscular re-education;Traction;ADLs/Self Care Home Management;Functional mobility training    PT Next Visit Plan continue with dressing changes    Consulted and Agree with Plan of Care Patient;Family member/caregiver    Family Member Consulted daughter           Patient will benefit from skilled therapeutic intervention in order to improve the following deficits and impairments:  Pain, Decreased strength, Decreased range of motion, Increased edema, Decreased skin integrity, Decreased scar mobility  Visit Diagnosis: Pain in right hand  Traumatic wound dehiscence, initial encounter     Problem List There are no problems to display for this patient.   12:04 PM, 07/10/20 [name], DPT Physical Therapy with [facility]  [phone] office  [place] [address] [place], [place], [address] Phone:  [phone]   Fax:  [phone]  Name: Tasha Roberts MRN: [id number] Date of Birth: 1941-01-27

## 2020-07-12 ENCOUNTER — Other Ambulatory Visit: Payer: Self-pay

## 2020-07-12 ENCOUNTER — Ambulatory Visit (HOSPITAL_COMMUNITY): Payer: Medicare Other | Admitting: Physical Therapy

## 2020-07-12 DIAGNOSIS — M79641 Pain in right hand: Secondary | ICD-10-CM

## 2020-07-12 DIAGNOSIS — T8133XA Disruption of traumatic injury wound repair, initial encounter: Secondary | ICD-10-CM

## 2020-07-12 NOTE — Therapy (Addendum)
[place], [place], [address] Phone: [phone]   Fax:  [phone]  Wound Care Therapy and Discharge Note  Patient Details  Name: Tasha Roberts MRN: [id number] Date of Birth: 07-10-1941 Referring Provider (PT): Tasha Roberts, [place]  PHYSICAL THERAPY DISCHARGE SUMMARY  Visits from Start of Care: 9  Current functional level related to goals / functional outcomes: See below   Remaining deficits: See below   Education / Equipment: See below  Plan: Patient agrees to discharge.  Patient goals were partially met. Patient is being discharged due to being pleased with the current functional level.  ?????        Patient called and reported wound had completely healed.  3:20 PM, 07/17/20 Tasha Roberts, DPT Physical Therapy with [facility]  [phone] office   Encounter Date: 07/12/2020   PT End of Session - 07/12/20 1148    Visit Number 9    Number of Visits 12    Date for PT Re-Evaluation 07/30/20    Authorization Type Medicare, secondary - BCBS federal, VL 75 combined, no auth    Authorization - Visit Number 9    Authorization - Number of Visits 75    Progress Note Due on Visit 10    PT Start Time 0272    PT Stop Time 1148    PT Time Calculation (min) 24 min    Activity Tolerance Patient tolerated treatment well    Behavior During Therapy WFL for tasks assessed/performed           Past Medical History:  Diagnosis Date  . Atrial fibrillation ([place])   . Cirrhosis [facility])     Past Surgical History:  Procedure Laterality Date  . CHOLECYSTECTOMY      There were no vitals filed for this visit.      [name] PT Assessment - 07/12/20 0001      Assessment   Medical Diagnosis wound of right hand    Referring Provider (PT) Tasha Herring, PA                   Wound Therapy - 07/12/20 0001    Subjective reports no pain. dressing in place    Patient and Family Stated  Goals to have wound heal and to be able to use assistive device    Pain Scale 0-10    Pain Score 0-No pain    Evaluation and Treatment Procedures Explained to Patient/Family Yes    Evaluation and Treatment Procedures agreed to    Wound Properties Date First Assessed: 06/18/20 Time First Assessed: 0929 Wound Type: Laceration Location: Hand Location Orientation: Right;Other (Comment) Wound Description (Comments): dorsal aspect of hand Present on Admission: Yes   Dressing Type Impregnated gauze (bismuth);Foam - Lift dressing to assess site every shift;Gauze (Comment)    Dressing Changed Changed    Dressing Status None    Dressing Change Frequency PRN    Site / Wound Assessment Pink;Granulation tissue    % Wound base Red or Granulating 100%    % Wound base Yellow/Fibrinous Exudate 0%    Peri-wound Assessment Pink    Wound Length (cm) 1.6 cm   was 2.6   Wound Width (cm) 0.6 cm   was 1.2   Wound Depth (cm) 0 cm    Wound Volume (cm^3) 0 cm^3    Wound Surface Area (cm^2) 0.96 cm^2    Margins Unattached edges (unapproximated)    Drainage Amount None  Treatment Cleansed    Wound Therapy - Clinical Statement Continued approximation of wound bed. Wound required no debridement on this date. Educated patient and daughter about appropriate dressing changes and  provided patient with leftover xeroform for home changes. Canceling visits next week as patient and daughter would like to perform dressing changes independently. Keeping patient on schedule 2 weeks from now and incase wound has not closed by then will follow up with patient then. Instructed patient and daughter to call and scheduled if status of wound changes. Tiny bumps noted superior to scar from wound on right hand. No irritation or redness noted. Instructed patient to just monitor and notify MD if they change. Will follow up with patient in 2 weeks if warranted.     Wound Therapy - Functional Problem List difficulty walking and using  walker/cane, performing daily tasks - right hand dominant    Factors Delaying/Impairing Wound Healing Diabetes Mellitus;Other (comment)    Wound Therapy - Frequency 2X / week    Wound Therapy - Current Recommendations PT    Wound Plan Pt to go out of town - will continue wiht dressing changes - will follow up in 2 weeks with patient if needed     Dressing  xeroform, 4x4, 3"gauze roll, foam and netting                   PT Education - 07/12/20 1147    Education Details on continued wound care, cleansing and changing dressings    Person(s) Educated Patient    Methods Explanation    Comprehension Verbalized understanding            PT Short Term Goals - 07/06/20 0949      PT SHORT TERM GOAL #1   Title Swelling will be reduced in fingers by at least 50% per patient report to improve healing    Time 3    Period Weeks    Status Achieved    Target Date 07/09/20      PT SHORT TERM GOAL #2   Title Wound will be 100% granulated to improve ability to heal    Time 3    Period Weeks    Status Achieved    Target Date 07/09/20             PT Long Term Goals - 06/28/20 1222      PT LONG TERM GOAL #1   Title Wound will be completely healed to decrease risk of infection    Time 6    Period Weeks    Status On-going      PT LONG TERM GOAL #2   Title Patient will be able to use cane in right hand to improve safety in home to decrease risk of falls.    Time 6    Period Weeks    Status On-going                 Plan - 07/12/20 1153    Clinical Impression Statement see above    Personal Factors and Comorbidities Age;Comorbidity 1;Comorbidity 2    Comorbidities afib, on blood thinners, DB    Examination-Activity Limitations Bathing;Sleep;Caring for Others;Dressing;Hygiene/Grooming;Toileting;Lift;Locomotion Level    Examination-Participation Restrictions Laundry;Meal Prep;Cleaning    Stability/Clinical Decision Making Stable/Uncomplicated    Rehab Potential Good      PT Frequency 2x / week    PT Duration 6 weeks    PT Treatment/Interventions Aquatic Therapy;Cryotherapy;Electrical Stimulation;Therapeutic exercise;Therapeutic activities;Patient/family education;Dry needling;Compression bandaging;Neuromuscular re-education;Traction;ADLs/Self Care Home Management;Functional  mobility training    PT Next Visit Plan continue with dressing changes    Consulted and Agree with Plan of Care Patient;Family member/caregiver    Family Member Consulted daughter           Patient will benefit from skilled therapeutic intervention in order to improve the following deficits and impairments:  Pain, Decreased strength, Decreased range of motion, Increased edema, Decreased skin integrity, Decreased scar mobility  Visit Diagnosis: Pain in right hand  Traumatic wound dehiscence, initial encounter     Problem List There are no problems to display for this patient.  11:54 AM, 07/12/20 Tasha Roberts, DPT Physical Therapy with [facility]  [phone] office  [place] [address] [place], [place], [address] Phone: [phone]   Fax:  [phone]  Name: Tasha Roberts MRN: [id number] Date of Birth: Jul 18, 1941

## 2020-07-17 ENCOUNTER — Ambulatory Visit (HOSPITAL_COMMUNITY): Payer: Medicare Other

## 2020-07-19 ENCOUNTER — Ambulatory Visit (HOSPITAL_COMMUNITY): Payer: Medicare Other

## 2020-07-24 ENCOUNTER — Ambulatory Visit (HOSPITAL_COMMUNITY): Payer: Medicare Other

## 2020-07-26 ENCOUNTER — Ambulatory Visit (HOSPITAL_COMMUNITY): Payer: Medicare Other

## 2020-07-31 ENCOUNTER — Ambulatory Visit (HOSPITAL_COMMUNITY): Payer: Medicare Other | Admitting: Physical Therapy

## 2020-08-02 ENCOUNTER — Ambulatory Visit (HOSPITAL_COMMUNITY): Payer: Medicare Other | Admitting: Physical Therapy

## 2020-09-07 DIAGNOSIS — Z8619 Personal history of other infectious and parasitic diseases: Secondary | ICD-10-CM

## 2020-09-07 HISTORY — DX: Personal history of other infectious and parasitic diseases: Z86.19

## 2020-10-01 ENCOUNTER — Emergency Department (HOSPITAL_COMMUNITY): Payer: Medicare Other

## 2020-10-01 ENCOUNTER — Ambulatory Visit (INDEPENDENT_AMBULATORY_CARE_PROVIDER_SITE_OTHER): Payer: Medicare Other

## 2020-10-01 ENCOUNTER — Inpatient Hospital Stay (HOSPITAL_COMMUNITY)
Admission: EM | Admit: 2020-10-01 | Discharge: 2020-10-03 | DRG: 189 | Disposition: A | Discharge status: Home / Self Care | Payer: Medicare Other | Attending: Family Medicine | Admitting: Family Medicine

## 2020-10-01 ENCOUNTER — Encounter (HOSPITAL_COMMUNITY): Payer: Self-pay | Admitting: Emergency Medicine

## 2020-10-01 ENCOUNTER — Ambulatory Visit
Admission: EM | Admit: 2020-10-01 | Discharge: 2020-10-01 | Disposition: A | Discharge status: Home / Self Care | Payer: Medicare Other

## 2020-10-01 ENCOUNTER — Other Ambulatory Visit: Payer: Self-pay

## 2020-10-01 DIAGNOSIS — E1165 Type 2 diabetes mellitus with hyperglycemia: Secondary | ICD-10-CM | POA: Diagnosis not present

## 2020-10-01 DIAGNOSIS — J9691 Respiratory failure, unspecified with hypoxia: Secondary | ICD-10-CM

## 2020-10-01 DIAGNOSIS — T380X5A Adverse effect of glucocorticoids and synthetic analogues, initial encounter: Secondary | ICD-10-CM | POA: Diagnosis not present

## 2020-10-01 DIAGNOSIS — R0902 Hypoxemia: Secondary | ICD-10-CM | POA: Diagnosis not present

## 2020-10-01 DIAGNOSIS — Z8249 Family history of ischemic heart disease and other diseases of the circulatory system: Secondary | ICD-10-CM | POA: Diagnosis not present

## 2020-10-01 DIAGNOSIS — F32A Depression, unspecified: Secondary | ICD-10-CM | POA: Diagnosis present

## 2020-10-01 DIAGNOSIS — F419 Anxiety disorder, unspecified: Secondary | ICD-10-CM | POA: Diagnosis present

## 2020-10-01 DIAGNOSIS — J9601 Acute respiratory failure with hypoxia: Principal | ICD-10-CM

## 2020-10-01 DIAGNOSIS — I480 Paroxysmal atrial fibrillation: Secondary | ICD-10-CM | POA: Diagnosis present

## 2020-10-01 DIAGNOSIS — Z23 Encounter for immunization: Secondary | ICD-10-CM | POA: Diagnosis not present

## 2020-10-01 DIAGNOSIS — Z79899 Other long term (current) drug therapy: Secondary | ICD-10-CM

## 2020-10-01 DIAGNOSIS — B974 Respiratory syncytial virus as the cause of diseases classified elsewhere: Secondary | ICD-10-CM | POA: Diagnosis present

## 2020-10-01 DIAGNOSIS — K7581 Nonalcoholic steatohepatitis (NASH): Secondary | ICD-10-CM | POA: Diagnosis present

## 2020-10-01 DIAGNOSIS — E785 Hyperlipidemia, unspecified: Secondary | ICD-10-CM | POA: Diagnosis present

## 2020-10-01 DIAGNOSIS — E890 Postprocedural hypothyroidism: Secondary | ICD-10-CM | POA: Diagnosis present

## 2020-10-01 DIAGNOSIS — Z7984 Long term (current) use of oral hypoglycemic drugs: Secondary | ICD-10-CM

## 2020-10-01 DIAGNOSIS — Z20822 Contact with and (suspected) exposure to covid-19: Secondary | ICD-10-CM | POA: Diagnosis present

## 2020-10-01 DIAGNOSIS — Z79890 Hormone replacement therapy (postmenopausal): Secondary | ICD-10-CM

## 2020-10-01 DIAGNOSIS — R059 Cough, unspecified: Secondary | ICD-10-CM | POA: Diagnosis present

## 2020-10-01 DIAGNOSIS — I10 Essential (primary) hypertension: Secondary | ICD-10-CM | POA: Diagnosis present

## 2020-10-01 DIAGNOSIS — K7460 Unspecified cirrhosis of liver: Secondary | ICD-10-CM | POA: Diagnosis present

## 2020-10-01 DIAGNOSIS — Z7901 Long term (current) use of anticoagulants: Secondary | ICD-10-CM | POA: Diagnosis not present

## 2020-10-01 DIAGNOSIS — J471 Bronchiectasis with (acute) exacerbation: Secondary | ICD-10-CM

## 2020-10-01 DIAGNOSIS — Z888 Allergy status to other drugs, medicaments and biological substances status: Secondary | ICD-10-CM | POA: Diagnosis not present

## 2020-10-01 DIAGNOSIS — R188 Other ascites: Secondary | ICD-10-CM

## 2020-10-01 LAB — TROPONIN I (HIGH SENSITIVITY)
Troponin I (High Sensitivity): 17 ng/L (ref <18)
Troponin I (High Sensitivity): 15 ng/L (ref <18)

## 2020-10-01 LAB — CBC WITH DIFFERENTIAL/PLATELET
Abs Immature Granulocytes: 0.02 10*3/uL (ref 0.00–0.07)
Basophils Absolute: 0 10*3/uL (ref 0.0–0.1)
Basophils Relative: 0 %
Eosinophils Absolute: 0 10*3/uL (ref 0.0–0.5)
Eosinophils Relative: 1 %
HCT: 38.6 % (ref 36.0–46.0)
Hemoglobin: 12.9 g/dL (ref 12.0–15.0)
Immature Granulocytes: 0 %
Lymphocytes Relative: 17 %
Lymphs Abs: 1 10*3/uL (ref 0.7–4.0)
MCH: 30.4 pg (ref 26.0–34.0)
MCHC: 33.4 g/dL (ref 30.0–36.0)
MCV: 90.8 fL (ref 80.0–100.0)
Monocytes Absolute: 0.4 10*3/uL (ref 0.1–1.0)
Monocytes Relative: 6 %
Neutro Abs: 4.7 10*3/uL (ref 1.7–7.7)
Neutrophils Relative %: 76 %
Platelets: 117 10*3/uL — ABNORMAL LOW (ref 150–400)
RBC: 4.25 MIL/uL (ref 3.87–5.11)
RDW: 13.2 % (ref 11.5–15.5)
WBC: 6.2 10*3/uL (ref 4.0–10.5)
nRBC: 0 % (ref 0.0–0.2)

## 2020-10-01 LAB — BASIC METABOLIC PANEL
Anion gap: 12 (ref 5–15)
BUN: 27 mg/dL — ABNORMAL HIGH (ref 8–23)
CO2: 25 mmol/L (ref 22–32)
Calcium: 9.2 mg/dL (ref 8.9–10.3)
Chloride: 101 mmol/L (ref 98–111)
Creatinine, Ser: 1.06 mg/dL — ABNORMAL HIGH (ref 0.44–1.00)
GFR, Estimated: 54 mL/min — ABNORMAL LOW (ref >60)
Glucose, Bld: 105 mg/dL — ABNORMAL HIGH (ref 70–99)
Potassium: 3.8 mmol/L (ref 3.5–5.1)
Sodium: 138 mmol/L (ref 135–145)

## 2020-10-01 LAB — LACTIC ACID, PLASMA: Lactic Acid, Venous: 1 mmol/L (ref 0.5–1.9)

## 2020-10-01 LAB — BRAIN NATRIURETIC PEPTIDE: B Natriuretic Peptide: 107 pg/mL — ABNORMAL HIGH (ref 0.0–100.0)

## 2020-10-01 LAB — RESPIRATORY PANEL BY RT PCR (FLU A&B, COVID)
Influenza A by PCR: NEGATIVE
Influenza B by PCR: NEGATIVE
SARS Coronavirus 2 by RT PCR: NEGATIVE

## 2020-10-01 MED ORDER — HYDROCOD POLST-CPM POLST ER 10-8 MG/5ML PO SUER: 5.0000 mL | Freq: Every evening | ORAL | Status: DC | PRN

## 2020-10-01 MED ORDER — DILTIAZEM HCL ER COATED BEADS 240 MG PO CP24
240.0000 mg | ORAL_CAPSULE | Freq: Every day | ORAL | Status: DC
  Administered 2020-10-02 – 2020-10-03 (×2): 240 mg via ORAL
  Filled 2020-10-01 (×3): qty 1

## 2020-10-01 MED ORDER — ATORVASTATIN CALCIUM 20 MG PO TABS
20.0000 mg | ORAL_TABLET | Freq: Every day | ORAL | Status: DC
  Administered 2020-10-02 – 2020-10-03 (×2): 20 mg via ORAL
  Filled 2020-10-01: qty 1
  Filled 2020-10-01: qty 2

## 2020-10-01 MED ORDER — DM-GUAIFENESIN ER 30-600 MG PO TB12
1.0000 | ORAL_TABLET | Freq: Two times a day (BID) | ORAL | Status: DC
  Administered 2020-10-01 – 2020-10-03 (×4): 1 via ORAL
  Filled 2020-10-01 (×4): qty 1

## 2020-10-01 MED ORDER — ENALAPRIL MALEATE 2.5 MG PO TABS
2.5000 mg | ORAL_TABLET | Freq: Every day | ORAL | Status: DC
  Filled 2020-10-01 (×2): qty 1

## 2020-10-01 MED ORDER — FLUOXETINE HCL 20 MG PO CAPS
20.0000 mg | ORAL_CAPSULE | Freq: Every day | ORAL | Status: DC
  Administered 2020-10-03: 20 mg via ORAL
  Filled 2020-10-01 (×2): qty 1

## 2020-10-01 MED ORDER — LEVOTHYROXINE SODIUM 75 MCG PO TABS
150.0000 ug | ORAL_TABLET | Freq: Every day | ORAL | Status: DC
  Administered 2020-10-02 – 2020-10-03 (×2): 150 ug via ORAL
  Filled 2020-10-01: qty 3
  Filled 2020-10-01: qty 2

## 2020-10-01 MED ORDER — APIXABAN 5 MG PO TABS
5.0000 mg | ORAL_TABLET | Freq: Two times a day (BID) | ORAL | Status: DC
  Administered 2020-10-01 – 2020-10-03 (×4): 5 mg via ORAL
  Filled 2020-10-01 (×4): qty 1

## 2020-10-01 MED ORDER — SODIUM CHLORIDE 0.9 % IV SOLN
1.0000 g | Freq: Once | INTRAVENOUS | Status: AC
  Administered 2020-10-01: 1 g via INTRAVENOUS
  Filled 2020-10-01: qty 10

## 2020-10-01 MED ORDER — SODIUM CHLORIDE 0.9 % IV SOLN
500.0000 mg | INTRAVENOUS | Status: DC
  Administered 2020-10-01 – 2020-10-02 (×2): 500 mg via INTRAVENOUS
  Filled 2020-10-01 (×3): qty 500

## 2020-10-01 MED ORDER — IOHEXOL 350 MG/ML SOLN
100.0000 mL | Freq: Once | INTRAVENOUS | Status: AC | PRN
  Administered 2020-10-01: 100 mL via INTRAVENOUS

## 2020-10-01 MED ORDER — IPRATROPIUM-ALBUTEROL 0.5-2.5 (3) MG/3ML IN SOLN
3.0000 mL | Freq: Once | RESPIRATORY_TRACT | Status: AC
  Administered 2020-10-01: 3 mL via RESPIRATORY_TRACT
  Filled 2020-10-01: qty 3

## 2020-10-01 MED ORDER — METHYLPREDNISOLONE SODIUM SUCC 125 MG IJ SOLR
80.0000 mg | Freq: Once | INTRAMUSCULAR | Status: AC
  Administered 2020-10-01: 80 mg via INTRAMUSCULAR

## 2020-10-01 MED ORDER — SODIUM CHLORIDE 0.9 % IV SOLN
1.0000 g | INTRAVENOUS | Status: DC
  Administered 2020-10-02: 1 g via INTRAVENOUS
  Filled 2020-10-01: qty 10

## 2020-10-01 MED ORDER — HYDROCOD POLST-CPM POLST ER 10-8 MG/5ML PO SUER
5.0000 mL | Freq: Once | ORAL | Status: AC
  Administered 2020-10-01: 5 mL via ORAL
  Filled 2020-10-01: qty 5

## 2020-10-01 MED ORDER — IPRATROPIUM-ALBUTEROL 0.5-2.5 (3) MG/3ML IN SOLN: 3.0000 mL | RESPIRATORY_TRACT | Status: DC | PRN

## 2020-10-01 NOTE — ED Provider Notes (Addendum)
[facility]    CSN: [id number] Arrival date & time: 10/01/20  1211      History   Chief Complaint Chief Complaint  Patient presents with  . Cough    HPI Tasha Roberts is a 79 y.o. female.   HPI  Patient presents today accompanied by her daughter with worsening fatigue and a productive cough x7 days.  Patient has also had increased work of breathing.  On arrival her oxygen level fluctuate between 89 and 90%.  With short ambulation patient's oxygen level dropped to mid 80s and she required 2 L of oxygen in order to maintain oxygen level of 91%.  Patient denies fever.  She is unaware of any sick contacts.  Her medical history is significant for atrial fibrillation and cirrhosis of the liver. Past Medical History:  Diagnosis Date  . A-fib ([place])   . Atrial fibrillation ([place])   . Cirrhosis ([place])     There are no problems to display for this patient.   Past Surgical History:  Procedure Laterality Date  . BREAST SURGERY    . CHOLECYSTECTOMY      OB History   No obstetric history on file.      Home Medications    Prior to Admission medications   Medication Sig Start Date End Date Taking? Authorizing Provider  apixaban (ELIQUIS) 5 MG TABS tablet Take 1 tablet by mouth 2 (two) times daily. 03/12/20  Yes [provider]  lisinopril-hydrochlorothiazide (ZESTORETIC) 20-25 MG tablet Take 1 tablet by mouth daily. 12/06/19  Yes [provider]  atorvastatin (LIPITOR) 20 MG tablet Take 20 mg by mouth daily. 07/02/20   [provider]  diltiazem (CARDIZEM CD) 240 MG 24 hr capsule Take 240 mg by mouth daily. 06/27/20   [provider]  enalapril (VASOTEC) 2.5 MG tablet Take by mouth.    [provider]  glimepiride (AMARYL) 2 MG tablet Take by mouth.    [provider]  levothyroxine (SYNTHROID) 150 MCG tablet Take 150 mcg by mouth daily. 07/03/20   [provider]  nitroGLYCERIN (NITROSTAT) 0.4 MG SL tablet  SMARTSIG:1 Tablet(s) Sublingual PRN 04/18/20   [provider]  topiramate (TOPAMAX) 25 MG tablet Take 25 mg by mouth at bedtime. 09/24/20   [provider]    Family History History reviewed. No pertinent family history.  Social History Social History   Tobacco Use  . Smoking status: Never Smoker  . Smokeless tobacco: Never Used  Substance Use Topics  . Alcohol use: Not Currently  . Drug use: Not Currently     Allergies   Metformin and related Review of Systems Review of Systems Pertinent negatives listed in HPI Physical Exam Triage Vital Signs ED Triage Vitals  Enc Vitals Group     BP 10/01/20 1325 118/67     Pulse Rate 10/01/20 1325 79     Resp 10/01/20 1325 (!) 22     Temp 10/01/20 1325 98.9 F (37.2 C)     Temp src --      SpO2 10/01/20 1325 90 %     Weight --      Height --      Head Circumference --      Peak Flow --      Pain Score 10/01/20 1321 0     Pain Loc --      Pain Edu? --      Excl. in [place]? --    No data found.  Updated Vital Signs  BP 118/67   Pulse 79   Temp 98.9 F (37.2 C)   Resp (!) 22   SpO2 90%   Visual Acuity Right Eye Distance:   Left Eye Distance:   Bilateral Distance:    Right Eye Near:   Left Eye Near:    Bilateral Near:     Physical Exam Constitutional:      Appearance: She is ill-appearing and toxic-appearing.  Cardiovascular:     Rate and Rhythm: Normal rate. Rhythm irregular.  Pulmonary:     Effort: Tachypnea present.     Breath sounds: Transmitted upper airway sounds present. Examination of the right-upper field reveals rales. Examination of the left-upper field reveals rales. Examination of the right-middle field reveals rales. Examination of the left-middle field reveals rales. Examination of the right-lower field reveals rales. Examination of the left-lower field reveals rales. Rales present.  Skin:    General: Skin is warm and dry.  Neurological:     Motor: Weakness present.     Gait: Gait  abnormal.      UC Treatments / Results  Labs (all labs ordered are listed, but only abnormal results are displayed) Labs Reviewed - No data to display  EKG   Radiology No results found.  Procedures Procedures (including critical care time)  Medications Ordered in UC Medications - No data to display  Initial Impression / Assessment and Plan / UC Course  I have reviewed the triage vital signs and the nursing notes.  Pertinent labs & imaging results that were available during my care of the patient were reviewed by me and considered in my medical decision making (see chart for details).     Given patient is hypoxic 85-88% on RA, irregular heart rhythm, and overall comorbid conditions, recommend emergent evaluation in ER. Patient received 2 liters of oxygen, SPO2 improved to 91%. Offered EMS, patient declined. Daughter agreed to take patient immediately from [place]. Patient is stable enough for less than 2 mile transport to the ER.  Final Clinical Impressions(s) / UC Diagnoses   Final diagnoses:  Hypoxia  Acute respiratory failure with hypoxia [facility])     Discharge Instructions     Go immediately to [facility] or [facility] for further work-up and evaluation.  Chest x-ray here is negative for pneumonia however oxygen level has been in the mid to low 80s without oxygen.  You require further evaluation to rule out source of low oxygen level as this could be related to respiratory condition or heart condition.    ED Prescriptions    None     PDMP not reviewed this encounter.   Scot Jun, FNP 10/02/20 2328    Scot Jun, [place] 10/02/20 [phone]

## 2020-10-01 NOTE — ED Triage Notes (Signed)
Pt c/o of productive cough that started 1 week ago. Denies covid exposure.

## 2020-10-01 NOTE — ED Notes (Signed)
Pt transported to CT ?

## 2020-10-01 NOTE — ED Triage Notes (Signed)
Pt presents with c/o productive  cough that developed last Tuesday

## 2020-10-01 NOTE — H&P (Signed)
TRH H&P    Patient Demographics:    Tasha Roberts, is a 79 y.o. female  MRN: [id number]  DOB - [birth year]  Admit Date - 10/01/2020  Referring MD/NP/PA: Gilford Raid  Outpatient Primary MD for the patient is Neale Burly, MD  Patient coming from: Home  Chief complaint- Cough   HPI:    Tasha Roberts  is a 79 y.o. female, with history of atrial fibrillation and cirrhosis presents to the ED with a chief complaint of cough.  Patient reports that the cough started 6 days ago.  It is increased in frequency and intensity over those 6 days.  She reports that it was sounds wet, but she cannot bring any mucus up.  She is not had this before.  She has associated shortness of breath that is worse on exertion.  She has no fever, no shaking chills, no sick contacts.  She has been vaccinated for Covid.  She does not smoke, does not drink alcohol, and does not use illicit drugs.  Patient does report a associated chest pressure only while she is coughing.  It is nonexertional chest pressure.  It is in the center of her chest.  She reports that breathing treatment given in the ED has helped. Patient does not have a history of COPD or smoking. Patient has no other complaints at this time.  In the ED  Temperature 98.9, heart rate 82, respiratory rate 16, blood pressure 122/76, satting at 97% on 2 L nasal cannula No leukocytosis with white blood cell count of 6.2, CHEM panel is unremarkable BNP of 107, troponin of 17 and then 15, lactic acid 1.0 Blood cultures pending CT shows no PE, does have changes associated with bronchiectasis EKG is a heart rate of 80, QTC of 464, sinus rhythm Patient was started on Zithromax and Rocephin. Is also given 1 dose of Tussionex, and 1 DuoNeb Oxygen saturations drop with exertion when transferring from wheelchair to bed = 87%   Review of systems:    In addition to the HPI above,  No Fever-chills, No  Headache, No changes with Vision or hearing, No problems swallowing food or Liquids, No Chest pain, admits to cough and shortness of breath, No Abdominal pain, No Nausea or Vomiting, bowel movements are regular, No Blood in stool or Urine, No dysuria, No new skin rashes or bruises, No new joints pains-aches,  No new weakness, tingling, numbness in any extremity, No recent weight gain or loss, No polyuria, polydypsia or polyphagia, No significant Mental Stressors.  All other systems reviewed and are negative.    Past History of the following :    Past Medical History:  Diagnosis Date  . A-fib ([place])   . Atrial fibrillation ([place])   . Cirrhosis [facility])       Past Surgical History:  Procedure Laterality Date  . BREAST SURGERY    . CHOLECYSTECTOMY        Social History:      Social History   Tobacco Use  . Smoking status: Never Smoker  . Smokeless tobacco:  Never Used  Substance Use Topics  . Alcohol use: Not Currently       Family History :    History reviewed. No pertinent family history. Family history of hypertension   Home Medications:   Prior to Admission medications   Medication Sig Start Date End Date Taking? Authorizing Provider  apixaban (ELIQUIS) 5 MG TABS tablet Take 1 tablet by mouth 2 (two) times daily. 03/12/20  Yes [provider]  atorvastatin (LIPITOR) 20 MG tablet Take 20 mg by mouth daily. 07/02/20  Yes [provider]  Cholecalciferol 50 MCG (2000 UT) CAPS Take 2,000 mg by mouth daily.   Yes [provider]  clotrimazole-betamethasone (LOTRISONE) cream Apply topically 2 (two) times daily. 07/02/20  Yes [provider]  diltiazem (CARDIZEM CD) 240 MG 24 hr capsule Take 240 mg by mouth daily. 06/27/20  Yes [provider]  enalapril (VASOTEC) 2.5 MG tablet Take by mouth.   Yes [provider]  FLUoxetine (PROZAC) 20 MG tablet Take 20 mg by mouth daily.   Yes [provider]  glimepiride  (AMARYL) 2 MG tablet Take by mouth.   Yes [provider]  levothyroxine (SYNTHROID) 150 MCG tablet Take 150 mcg by mouth daily. 07/03/20  Yes [provider]  lisinopril-hydrochlorothiazide (ZESTORETIC) 20-25 MG tablet Take 1 tablet by mouth daily. 12/06/19  Yes [provider]  MEGARED OMEGA-3 KRILL OIL PO Take 1 capsule by mouth every morning.   Yes [provider]  nitroGLYCERIN (NITROSTAT) 0.4 MG SL tablet Place 0.4 mg under the tongue every 5 (five) minutes as needed.  04/18/20  Yes [provider]  topiramate (TOPAMAX) 25 MG tablet Take 25 mg by mouth at bedtime. 09/24/20   [provider]     Allergies:     Allergies  Allergen Reactions  . Metformin And Related      Physical Exam:   Vitals  Blood pressure 122/76, pulse 82, temperature 99.4 F (37.4 C), temperature source Oral, resp. rate 16, height 5\' 4"  (1.626 m), weight 59.9 kg, SpO2 97 %.  1.  General: Lying supine in bed with head of bed elevated  2. Psychiatric: Pleasant, alert, oriented  3. Neurologic: Cranial nerves II through XII intact, moves all 4 extremities voluntarily, no focal deficit on limited exam  4. HEENMT:  Head is atraumatic, normocephalic, pupils reactive to light, neck is supple, mucous membranes are moist, trachea is midline  5. Respiratory : Lungs with rhonchi bilaterally, wheezy wet sounding cough that is nonproductive, no crackles, no cyanosis  6. Cardiovascular : Heart rate is normal, rhythm is regular, no murmurs rubs or gallops  7. Gastrointestinal:  Abdomen is soft, nondistended, nontender to palpation  8. Skin:  No acute lesions on limited skin exam  9.Musculoskeletal:  No peripheral edema    Data Review:    CBC Recent Labs  Lab 10/01/20 1609  WBC 6.2  HGB 12.9  HCT 38.6  PLT 117*  MCV 90.8  MCH 30.4  MCHC 33.4  RDW 13.2  LYMPHSABS 1.0  MONOABS 0.4  EOSABS 0.0  BASOSABS 0.0    ------------------------------------------------------------------------------------------------------------------  Results for orders placed or performed during the hospital encounter of 10/01/20 (from the past 48 hour(s))  Respiratory Panel by RT PCR (Flu A&B, Covid) - Nasopharyngeal Swab     Status: None   Collection Time: 10/01/20  3:53 PM   Specimen: Nasopharyngeal Swab  Result Value Ref Range   SARS Coronavirus 2 by RT PCR NEGATIVE NEGATIVE  Comment: (NOTE) SARS-CoV-2 target nucleic acids are NOT DETECTED.  The SARS-CoV-2 RNA is generally detectable in upper respiratoy specimens during the acute phase of infection. The lowest concentration of SARS-CoV-2 viral copies this assay can detect is 131 copies/mL. A negative result does not preclude SARS-Cov-2 infection and should not be used as the sole basis for treatment or other patient management decisions. A negative result may occur with  improper specimen collection/handling, submission of specimen other than nasopharyngeal swab, presence of viral mutation(s) within the areas targeted by this assay, and inadequate number of viral copies (<131 copies/mL). A negative result must be combined with clinical observations, patient history, and epidemiological information. The expected result is Negative.  Fact Sheet for Patients:  PinkCheek.be  Fact Sheet for Healthcare Providers:  GravelBags.it  This test is no t yet approved or cleared by the [place] FDA and  has been authorized for detection and/or diagnosis of SARS-CoV-2 by FDA under an Emergency Use Authorization (EUA). This EUA will remain  in effect (meaning this test can be used) for the duration of the COVID-19 declaration under Section 564(b)(1) of the Act, 21 U.S.C. section 360bbb-3(b)(1), unless the authorization is terminated or revoked sooner.     Influenza A by PCR NEGATIVE NEGATIVE   Influenza B  by PCR NEGATIVE NEGATIVE    Comment: (NOTE) The Xpert Xpress SARS-CoV-2/FLU/RSV assay is intended as an aid in  the diagnosis of influenza from Nasopharyngeal swab specimens and  should not be used as a sole basis for treatment. Nasal washings and  aspirates are unacceptable for Xpert Xpress SARS-CoV-2/FLU/RSV  testing.  Fact Sheet for Patients: PinkCheek.be  Fact Sheet for Healthcare Providers: GravelBags.it  This test is not yet approved or cleared by the [place] FDA and  has been authorized for detection and/or diagnosis of SARS-CoV-2 by  FDA under an Emergency Use Authorization (EUA). This EUA will remain  in effect (meaning this test can be used) for the duration of the  Covid-19 declaration under Section 564(b)(1) of the Act, 21  U.S.C. section 360bbb-3(b)(1), unless the authorization is  terminated or revoked. Performed at [facility], [address] [place], [place] [address]   Basic metabolic panel     Status: Abnormal   Collection Time: 10/01/20  4:09 PM  Result Value Ref Range   Sodium 138 135 - 145 mmol/L   Potassium 3.8 3.5 - 5.1 mmol/L   Chloride 101 98 - 111 mmol/L   CO2 25 22 - 32 mmol/L   Glucose, Bld 105 (H) 70 - 99 mg/dL    Comment: Glucose reference range applies only to samples taken after fasting for at least 8 hours.   BUN 27 (H) 8 - 23 mg/dL   Creatinine, Ser 1.06 (H) 0.44 - 1.00 mg/dL   Calcium 9.2 8.9 - 10.3 mg/dL   GFR, Estimated 54 (L) >60 mL/min    Comment: (NOTE) Calculated using the CKD-EPI Creatinine Equation (2021)    Anion gap 12 5 - 15    Comment: Performed at [facility], [address] [place], [place] [address]  Brain natriuretic peptide     Status: Abnormal   Collection Time: 10/01/20  4:09 PM  Result Value Ref Range   B Natriuretic Peptide 107.0 (H) 0.0 - 100.0 pg/mL    Comment: Performed at [facility], [address], [place],  [address]  CBC with  Differential     Status: Abnormal   Collection Time: 10/01/20  4:09 PM  Result Value  Ref Range   WBC 6.2 4.0 - 10.5 K/uL   RBC 4.25 3.87 - 5.11 MIL/uL   Hemoglobin 12.9 12.0 - 15.0 g/dL   HCT 38.6 36 - 46 %   MCV 90.8 80.0 - 100.0 fL   MCH 30.4 26.0 - 34.0 pg   MCHC 33.4 30.0 - 36.0 g/dL   RDW 13.2 11.5 - 15.5 %   Platelets 117 (L) 150 - 400 K/uL    Comment: REPEATED TO VERIFY PLATELET COUNT CONFIRMED BY SMEAR SPECIMEN CHECKED FOR CLOTS    nRBC 0.0 0.0 - 0.2 %   Neutrophils Relative % 76 %   Neutro Abs 4.7 1.7 - 7.7 K/uL   Lymphocytes Relative 17 %   Lymphs Abs 1.0 0.7 - 4.0 K/uL   Monocytes Relative 6 %   Monocytes Absolute 0.4 0.1 - 1.0 K/uL   Eosinophils Relative 1 %   Eosinophils Absolute 0.0 0.0 - 0.5 K/uL   Basophils Relative 0 %   Basophils Absolute 0.0 0.0 - 0.1 K/uL   Immature Granulocytes 0 %   Abs Immature Granulocytes 0.02 0.00 - 0.07 K/uL    Comment: Performed at [facility], [address], [place], [place] [address]  Troponin I (High Sensitivity)     Status: None   Collection Time: 10/01/20  4:09 PM  Result Value Ref Range   Troponin I (High Sensitivity) 17 <18 ng/L    Comment: (NOTE) Elevated high sensitivity troponin I (hsTnI) values and significant  changes across serial measurements may suggest ACS but many other  chronic and acute conditions are known to elevate hsTnI results.  Refer to the "Links" section for chest pain algorithms and additional  guidance. Performed at [facility], [address] [place], [place] [address]   Culture, blood (routine x 2)     Status: None (Preliminary result)   Collection Time: 10/01/20  4:10 PM   Specimen: Left Antecubital; Blood  Result Value Ref Range   Specimen Description LEFT ANTECUBITAL    Special Requests      BOTTLES DRAWN AEROBIC AND ANAEROBIC Blood Culture adequate volume Performed at [facility], [address], [place], [place] [address]    Culture PENDING    Report Status PENDING   Lactic acid,  plasma     Status: None   Collection Time: 10/01/20  4:10 PM  Result Value Ref Range   Lactic Acid, Venous 1.0 0.5 - 1.9 mmol/L    Comment: Performed at [facility], [address] [place], [place] [address]  Culture, blood (routine x 2)     Status: None (Preliminary result)   Collection Time: 10/01/20  4:18 PM   Specimen: BLOOD RIGHT FOREARM  Result Value Ref Range   Specimen Description BLOOD RIGHT FOREARM    Special Requests      BOTTLES DRAWN AEROBIC AND ANAEROBIC Blood Culture adequate volume Performed at [facility], [address], [place], [place] [address]    Culture PENDING    Report Status PENDING   Troponin I (High Sensitivity)     Status: None   Collection Time: 10/01/20  6:10 PM  Result Value Ref Range   Troponin I (High Sensitivity) 15 <18 ng/L    Comment: (NOTE) Elevated high sensitivity troponin I (hsTnI) values and significant  changes across serial measurements may suggest ACS but many other  chronic and acute conditions are known to elevate hsTnI results.  Refer to the "Links" section for chest pain algorithms and additional  guidance. Performed at [facility], [address], [place], [place] [address]     Chemistries  Recent Labs  Lab 10/01/20 1609  NA 138  K 3.8  CL 101  CO2 25  GLUCOSE 105*  BUN 27*  CREATININE 1.06*  CALCIUM 9.2   ------------------------------------------------------------------------------------------------------------------  ------------------------------------------------------------------------------------------------------------------ GFR: Estimated Creatinine Clearance: 38.4 mL/min (A) (by C-G formula based on SCr of 1.06 mg/dL (H)). Liver Function Tests: No results for input(s): AST, ALT, ALKPHOS, BILITOT, PROT, ALBUMIN in the last 168 hours. No results for input(s): LIPASE, AMYLASE in the last 168 hours. No results for input(s): AMMONIA in the last 168 hours. Coagulation Profile: No results for input(s): INR,  PROTIME in the last 168 hours. Cardiac Enzymes: No results for input(s): CKTOTAL, CKMB, CKMBINDEX, TROPONINI in the last 168 hours. BNP (last 3 results) No results for input(s): PROBNP in the last 8760 hours. HbA1C: No results for input(s): HGBA1C in the last 72 hours. CBG: No results for input(s): GLUCAP in the last 168 hours. Lipid Profile: No results for input(s): CHOL, HDL, LDLCALC, TRIG, CHOLHDL, LDLDIRECT in the last 72 hours. Thyroid Function Tests: No results for input(s): TSH, T4TOTAL, FREET4, T3FREE, THYROIDAB in the last 72 hours. Anemia Panel: No results for input(s): VITAMINB12, FOLATE, FERRITIN, TIBC, IRON, RETICCTPCT in the last 72 hours.  --------------------------------------------------------------------------------------------------------------- Urine analysis: No results found for: COLORURINE, APPEARANCEUR, LABSPEC, PHURINE, GLUCOSEU, HGBUR, BILIRUBINUR, KETONESUR, PROTEINUR, UROBILINOGEN, NITRITE, LEUKOCYTESUR    Imaging Results:    DG Chest 2 View  Result Date: 10/01/2020 CLINICAL DATA:  Cough for 1 week. EXAM: CHEST - 2 VIEW COMPARISON:  None. FINDINGS: The lungs are clear. Heart size is normal. Aortic atherosclerosis. No pneumothorax or pleural fluid. No acute or focal bony abnormality. Mild convex right thoracic scoliosis and multilevel degenerative change noted. Surgical clips are seen left axilla. IMPRESSION: No acute disease. Aortic Atherosclerosis (ICD10-I70.0). Electronically Signed   By: Inge Rise M.D.   On: 10/01/2020 14:01   CT Angio Chest PE W and/or Wo Contrast  Result Date: 10/01/2020 CLINICAL DATA:  Productive cough 1 week prior, denies COVID exposure, negative testing today EXAM: CT ANGIOGRAPHY CHEST WITH CONTRAST TECHNIQUE: Multidetector CT imaging of the chest was performed using the standard protocol during bolus administration of intravenous contrast. Multiplanar CT image reconstructions and MIPs were obtained to evaluate the vascular  anatomy. CONTRAST:  151mL OMNIPAQUE IOHEXOL 350 MG/ML SOLN COMPARISON:  Radiograph 10/01/2020 FINDINGS: Cardiovascular: Satisfactory opacification the pulmonary arteries to the segmental level. No pulmonary artery filling defects are identified. Central pulmonary arteries are normal caliber. Normal heart size. No pericardial effusion. Three-vessel coronary artery atherosclerosis. The aortic root is suboptimally assessed given cardiac pulsation artifact. Atherosclerotic plaque within the normal caliber aorta. No acute luminal abnormality of the imaged aorta. No periaortic stranding or hemorrhage. Normal 3 vessel branching of the aortic arch. Proximal great vessels are mildly calcified but otherwise unremarkable. No major venous abnormality. Mediastinum/Nodes: No mediastinal fluid or gas. Normal thyroid gland and thoracic inlet. No acute abnormality of the trachea or esophagus. No worrisome mediastinal, hilar or axillary adenopathy. Scattered subcentimeter partially calcified nodes are seen AP window and middle mediastinum. Lungs/Pleura: Diffuse airways thickening and scattered secretions are noted. Mid to upper lung predominant scarring and architectural distortion with mild bronchiectatic changes are present. Some more bandlike areas of reticular opacity likely reflect further parenchymal scarring and/or subsegmental atelectasis. Some dependent atelectasis noted posteriorly. No pneumothorax. No effusion. Upper Abdomen: Post cholecystectomy. Prominence of the biliary tree possibly related to post cholecystectomy reservoir effect. Slightly nodular  hepatic surface contour, could reflect stigmata of cirrhosis. No concerning focal liver lesion is seen. Splenomegaly is present. Cortical scarring seen in both kidneys, left greater than right. Partial fatty replacement of the pancreas. Upper abdominal atherosclerosis. Musculoskeletal: The osseous structures appear diffusely demineralized which may limit detection of small  or nondisplaced fractures. No acute osseous abnormality or suspicious osseous lesion. Remote bilateral anterior rib deformities as well as healed deformity of the sternum with lucencies through the costal cartilages. Could correlate for history of prior injury or resuscitative efforts/CPR. Curvature of the thoracic spine. Multilevel degenerative changes are present in the imaged portions of the spine. Multilevel flowing anterior osteophytosis, compatible with features of diffuse idiopathic skeletal hyperostosis (DISH). No worrisome chest wall lesions. Review of the MIP images confirms the above findings. IMPRESSION: 1. No evidence of acute pulmonary artery embolism. 2. Diffuse airways thickening and scattered secretions are noted with mid to upper lung predominant scarring and architectural distortion with mild bronchiectatic changes. Findings are favored to reflect sequela of prior infection/inflammation with some partially calcified lymph nodes suggesting sequela of remote granulomatous disease as well. 3. No acute intrathoracic process. 4. Remote bilateral anterior rib deformities as well as healed deformity of the sternum with lucencies through the costal cartilages. Could correlate for history of prior injury or resuscitative efforts/CPR. 5. Slightly nodular hepatic surface contour, compatible with stigmata of cirrhosis. Splenomegaly is present, suggestive of portal hypertension. 6. Cortical scarring in both kidneys, left greater than right. 7. Aortic Atherosclerosis (ICD10-I70.0). Electronically Signed   By: Lovena Le M.D.   On: 10/01/2020 18:07    My personal review of EKG: Rhythm NSR, Rate 80 /min, QTc 464,no Acute ST changes   Assessment & Plan:    Active Problems:   Respiratory failure with hypoxia ([place])   1. Acute hypoxic respiratory failure 1. Desaturation down to 87% when moving from wheelchair to bed 2. CTA shows diffuse airways thickening and scattered secretions noted with mid to  upper lung predominant scarring and architectural distortion and mild bronchiectatic changes.  Findings are favored to reflect sequela of prior infection/inflammation with some partially calcified lymph nodes suggesting sequela of remote granulomatous disease as well 3. No PE 4. Downtrending normal tropes from 17-15  5. EKG is nonischemic 6. Requiring 2 L nasal cannula to maintain saturations 7. Wean off O2 as tolerated 8. Monitor on telemetry 2. Bronchiectasis 1. Continue Rocephin and Zithromax 2. Sputum cultures pending 3. Continue duo nebs 4. Continue Mucinex 5. Continue Tussionex 6. Repeat chest x-ray in the a.m. 7. Continue to monitor 3. Atrial fibrillation 1. Continue Eliquis and diltiazem 2. Monitor on telemetry 4. Diabetes mellitus type 2 1. Hold Amaryl 2. Monitor glucose on BMP 3. If glucose begins to be elevated add sliding scale coverage 5. Thyroid disease 1. Check TSH 2. Continue levothyroxine    DVT Prophylaxis-   Eliquis- SCDs   AM Labs Ordered, also please review Full Orders  Family Communication: No family at bedside Code Status: DNR  Admission status: Inpatient :The appropriate admission status for this patient is INPATIENT. Inpatient status is judged to be reasonable and necessary in order to provide the required intensity of service to ensure the patient's safety. The patient's presenting symptoms, physical exam findings, and initial radiographic and laboratory data in the context of their chronic comorbidities is felt to place them at high risk for further clinical deterioration. Furthermore, it is not anticipated that the patient will be medically stable for discharge from the hospital within 2 midnights  of admission. The following factors support the admission status of inpatient.     The patient's presenting symptoms include dyspnea The worrisome physical exam findings include hypoxia down to 87%, rhonchi. The initial radiographic and laboratory data  are worrisome because of imaging with signs of bronchiectasis. The chronic co-morbidities include cirrhosis, A. Fib, diabetes mellitus type 2       * I certify that at the point of admission it is my clinical judgment that the patient will require inpatient hospital care spanning beyond 2 midnights from the point of admission due to high intensity of service, high risk for further deterioration and high frequency of surveillance required.*  Time spent in minutes : [place]

## 2020-10-01 NOTE — ED Provider Notes (Signed)
[facility] EMERGENCY DEPARTMENT Provider Note   CSN: [id number] Arrival date & time: 10/01/20  1444     History Chief Complaint  Patient presents with  . Cough    Tasha Roberts is a 79 y.o. female.  Pt presents to the ED today with cough and sob.  She has had sx for a week.  SOB worse with ambulation.  Pt denies any fevers.  No known sick contacts.  She's been Covid vaccinated.          Past Medical History:  Diagnosis Date  . A-fib ([place])   . Atrial fibrillation ([place])   . Cirrhosis [facility])     Patient Active Problem List   Diagnosis Date Noted  . Respiratory failure with hypoxia ([place]) 10/01/2020    Past Surgical History:  Procedure Laterality Date  . BREAST SURGERY    . CHOLECYSTECTOMY       OB History   No obstetric history on file.     History reviewed. No pertinent family history.  Social History   Tobacco Use  . Smoking status: Never Smoker  . Smokeless tobacco: Never Used  Substance Use Topics  . Alcohol use: Not Currently  . Drug use: Not Currently    Home Medications Prior to Admission medications   Medication Sig Start Date End Date Taking? Authorizing Provider  apixaban (ELIQUIS) 5 MG TABS tablet Take 1 tablet by mouth 2 (two) times daily. 03/12/20  Yes [provider]  atorvastatin (LIPITOR) 20 MG tablet Take 20 mg by mouth daily. 07/02/20  Yes [provider]  Cholecalciferol 50 MCG (2000 UT) CAPS Take 2,000 mg by mouth daily.   Yes [provider]  clotrimazole-betamethasone (LOTRISONE) cream Apply topically 2 (two) times daily. 07/02/20  Yes [provider]  diltiazem (CARDIZEM CD) 240 MG 24 hr capsule Take 240 mg by mouth daily. 06/27/20  Yes [provider]  enalapril (VASOTEC) 2.5 MG tablet Take by mouth.   Yes [provider]  FLUoxetine (PROZAC) 20 MG tablet Take 20 mg by mouth daily.   Yes [provider]  glimepiride (AMARYL) 2 MG tablet Take by mouth.   Yes [provider]  levothyroxine (SYNTHROID) 150 MCG tablet Take 150 mcg by mouth daily. 07/03/20  Yes [provider]  lisinopril-hydrochlorothiazide (ZESTORETIC) 20-25 MG tablet Take 1 tablet by mouth daily. 12/06/19  Yes [provider]  MEGARED OMEGA-3 KRILL OIL PO Take 1 capsule by mouth every morning.   Yes [provider]  nitroGLYCERIN (NITROSTAT) 0.4 MG SL tablet Place 0.4 mg under the tongue every 5 (five) minutes as needed.  04/18/20  Yes [provider]  topiramate (TOPAMAX) 25 MG tablet Take 25 mg by mouth at bedtime. 09/24/20   [provider]    Allergies    Metformin and related  Review of Systems   Review of Systems  Respiratory: Positive for cough and shortness of breath.   All other systems reviewed and are negative.   Physical Exam Updated Vital Signs BP 122/76   Pulse 82   Temp 99.4 F (37.4 C) (Oral)   Resp 16   Ht 5\' 4"  (1.626 m)   Wt 59.9 kg   SpO2 97%   BMI 22.66 kg/m   Physical Exam Vitals and nursing note reviewed.  Constitutional:      Appearance: Normal appearance.  HENT:     Head: Normocephalic and atraumatic.     Right Ear: External ear normal.  Left Ear: External ear normal.     Nose: Nose normal.     Mouth/Throat:     Mouth: Mucous membranes are moist.     Pharynx: Oropharynx is clear.  Eyes:     Extraocular Movements: Extraocular movements intact.     Conjunctiva/sclera: Conjunctivae normal.     Pupils: Pupils are equal, round, and reactive to light.  Cardiovascular:     Rate and Rhythm: Normal rate and regular rhythm.     Pulses: Normal pulses.     Heart sounds: Normal heart sounds.  Pulmonary:     Breath sounds: Rhonchi present.  Musculoskeletal:        General: Normal range of motion.     Cervical back: Normal range of motion and neck supple.  Skin:    General: Skin is warm.     Capillary Refill: Capillary refill takes less than 2 seconds.  Neurological:     General: No focal  deficit present.     Mental Status: She is alert and oriented to person, place, and time.  Psychiatric:        Mood and Affect: Mood normal.        Behavior: Behavior normal.     ED Results / Procedures / Treatments   Labs (all labs ordered are listed, but only abnormal results are displayed) Labs Reviewed  BASIC METABOLIC PANEL - Abnormal; Notable for the following components:      Result Value   Glucose, Bld 105 (*)    BUN 27 (*)    Creatinine, Ser 1.06 (*)    GFR, Estimated 54 (*)    All other components within normal limits  BRAIN NATRIURETIC PEPTIDE - Abnormal; Notable for the following components:   B Natriuretic Peptide 107.0 (*)    All other components within normal limits  CBC WITH DIFFERENTIAL/PLATELET - Abnormal; Notable for the following components:   Platelets 117 (*)    All other components within normal limits  RESPIRATORY PANEL BY RT PCR (FLU A&B, COVID)  CULTURE, BLOOD (ROUTINE X 2)  CULTURE, BLOOD (ROUTINE X 2)  LACTIC ACID, PLASMA  TROPONIN I (HIGH SENSITIVITY)  TROPONIN I (HIGH SENSITIVITY)    EKG EKG Interpretation  Date/Time:  Monday October 01 2020 15:59:30 EDT Ventricular Rate:  80 PR Interval:    QRS Duration: 92 QT Interval:  402 QTC Calculation: 464 R Axis:   38 Text Interpretation: Sinus rhythm No old tracing to compare Confirmed by Isla Pence [phone]) on 10/01/2020 4:01:26 PM   Radiology DG Chest 2 View  Result Date: 10/01/2020 CLINICAL DATA:  Cough for 1 week. EXAM: CHEST - 2 VIEW COMPARISON:  None. FINDINGS: The lungs are clear. Heart size is normal. Aortic atherosclerosis. No pneumothorax or pleural fluid. No acute or focal bony abnormality. Mild convex right thoracic scoliosis and multilevel degenerative change noted. Surgical clips are seen left axilla. IMPRESSION: No acute disease. Aortic Atherosclerosis (ICD10-I70.0). Electronically Signed   By: Inge Rise M.D.   On: 10/01/2020 14:01   CT Angio Chest PE W and/or Wo  Contrast  Result Date: 10/01/2020 CLINICAL DATA:  Productive cough 1 week prior, denies COVID exposure, negative testing today EXAM: CT ANGIOGRAPHY CHEST WITH CONTRAST TECHNIQUE: Multidetector CT imaging of the chest was performed using the standard protocol during bolus administration of intravenous contrast. Multiplanar CT image reconstructions and MIPs were obtained to evaluate the vascular anatomy. CONTRAST:  147mL OMNIPAQUE IOHEXOL 350 MG/ML SOLN COMPARISON:  Radiograph 10/01/2020 FINDINGS: Cardiovascular: Satisfactory opacification the pulmonary arteries  to the segmental level. No pulmonary artery filling defects are identified. Central pulmonary arteries are normal caliber. Normal heart size. No pericardial effusion. Three-vessel coronary artery atherosclerosis. The aortic root is suboptimally assessed given cardiac pulsation artifact. Atherosclerotic plaque within the normal caliber aorta. No acute luminal abnormality of the imaged aorta. No periaortic stranding or hemorrhage. Normal 3 vessel branching of the aortic arch. Proximal great vessels are mildly calcified but otherwise unremarkable. No major venous abnormality. Mediastinum/Nodes: No mediastinal fluid or gas. Normal thyroid gland and thoracic inlet. No acute abnormality of the trachea or esophagus. No worrisome mediastinal, hilar or axillary adenopathy. Scattered subcentimeter partially calcified nodes are seen AP window and middle mediastinum. Lungs/Pleura: Diffuse airways thickening and scattered secretions are noted. Mid to upper lung predominant scarring and architectural distortion with mild bronchiectatic changes are present. Some more bandlike areas of reticular opacity likely reflect further parenchymal scarring and/or subsegmental atelectasis. Some dependent atelectasis noted posteriorly. No pneumothorax. No effusion. Upper Abdomen: Post cholecystectomy. Prominence of the biliary tree possibly related to post cholecystectomy reservoir  effect. Slightly nodular hepatic surface contour, could reflect stigmata of cirrhosis. No concerning focal liver lesion is seen. Splenomegaly is present. Cortical scarring seen in both kidneys, left greater than right. Partial fatty replacement of the pancreas. Upper abdominal atherosclerosis. Musculoskeletal: The osseous structures appear diffusely demineralized which may limit detection of small or nondisplaced fractures. No acute osseous abnormality or suspicious osseous lesion. Remote bilateral anterior rib deformities as well as healed deformity of the sternum with lucencies through the costal cartilages. Could correlate for history of prior injury or resuscitative efforts/CPR. Curvature of the thoracic spine. Multilevel degenerative changes are present in the imaged portions of the spine. Multilevel flowing anterior osteophytosis, compatible with features of diffuse idiopathic skeletal hyperostosis (DISH). No worrisome chest wall lesions. Review of the MIP images confirms the above findings. IMPRESSION: 1. No evidence of acute pulmonary artery embolism. 2. Diffuse airways thickening and scattered secretions are noted with mid to upper lung predominant scarring and architectural distortion with mild bronchiectatic changes. Findings are favored to reflect sequela of prior infection/inflammation with some partially calcified lymph nodes suggesting sequela of remote granulomatous disease as well. 3. No acute intrathoracic process. 4. Remote bilateral anterior rib deformities as well as healed deformity of the sternum with lucencies through the costal cartilages. Could correlate for history of prior injury or resuscitative efforts/CPR. 5. Slightly nodular hepatic surface contour, compatible with stigmata of cirrhosis. Splenomegaly is present, suggestive of portal hypertension. 6. Cortical scarring in both kidneys, left greater than right. 7. Aortic Atherosclerosis (ICD10-I70.0). Electronically Signed   By: Lovena Le M.D.   On: 10/01/2020 18:07    Procedures Procedures (including critical care time)  Medications Ordered in ED Medications  cefTRIAXone (ROCEPHIN) 1 g in sodium chloride 0.9 % 100 mL IVPB (1 g Intravenous New Bag/Given 10/01/20 1842)  azithromycin (ZITHROMAX) 500 mg in sodium chloride 0.9 % 250 mL IVPB (500 mg Intravenous New Bag/Given 10/01/20 1841)  ipratropium-albuterol (DUONEB) 0.5-2.5 (3) MG/3ML nebulizer solution 3 mL (3 mLs Nebulization Given 10/01/20 1814)  chlorpheniramine-HYDROcodone (TUSSIONEX) 10-8 MG/5ML suspension 5 mL (5 mLs Oral Given 10/01/20 1722)  iohexol (OMNIPAQUE) 350 MG/ML injection 100 mL (100 mLs Intravenous Contrast Given 10/01/20 1739)    ED Course  I have reviewed the triage vital signs and the nursing notes.  Pertinent labs & imaging results that were available during my care of the patient were reviewed by me and considered in my medical decision making (see chart  for details).    MDM Rules/Calculators/A&P                           Pt does have a hx of afib and is on Eliquis.  CHA2DS2/VAS Stroke Risk Points  59 (age, female)    O2 sat on RA 87% upon arrival to the room.  Pt placed on 2L oxygen via [place] and O2 sats increased to low 90s.  Covid negative.  CT shows bronchiectasis, no PE or obvious pna.  Pt put on rocephin and zithromax for purulent sputum production.  Pt d/w Dr. Clearence Ped (triad) for admission.  Tasha Roberts was evaluated in Emergency Department on 10/01/2020 for the symptoms described in the history of present illness. She was evaluated in the context of the global COVID-19 pandemic, which necessitated consideration that the patient might be at risk for infection with the SARS-CoV-2 virus that causes COVID-19. Institutional protocols and algorithms that pertain to the evaluation of patients at risk for COVID-19 are in a state of rapid change based on information released by regulatory bodies including the CDC and federal and state  organizations. These policies and algorithms were followed during the patient's care in the ED.  CRITICAL CARE Performed by: Isla Pence   Total critical care time: 30 minutes  Critical care time was exclusive of separately billable procedures and treating other patients.  Critical care was necessary to treat or prevent imminent or life-threatening deterioration.  Critical care was time spent personally by me on the following activities: development of treatment plan with patient and/or surrogate as well as nursing, discussions with consultants, evaluation of patient's response to treatment, examination of patient, obtaining history from patient or surrogate, ordering and performing treatments and interventions, ordering and review of laboratory studies, ordering and review of radiographic studies, pulse oximetry and re-evaluation of patient's condition.  Final Clinical Impression(s) / ED Diagnoses Final diagnoses:  Acute respiratory failure with hypoxia ([place])  Bronchiectasis with acute exacerbation [facility])    Rx / DC Orders ED Discharge Orders    None       Isla Pence, MD 10/01/20 [phone]

## 2020-10-01 NOTE — Discharge Instructions (Addendum)
Go immediately to [facility] or [facility] for further work-up and evaluation.  Chest x-ray here is negative for pneumonia however oxygen level has been in the mid to low 80s without oxygen.  You require further evaluation to rule out source of low oxygen level as this could be related to respiratory condition or heart condition.

## 2020-10-02 ENCOUNTER — Encounter (HOSPITAL_COMMUNITY): Payer: Self-pay | Admitting: Family Medicine

## 2020-10-02 ENCOUNTER — Inpatient Hospital Stay (HOSPITAL_COMMUNITY): Payer: Medicare Other

## 2020-10-02 ENCOUNTER — Inpatient Hospital Stay: Payer: Self-pay

## 2020-10-02 DIAGNOSIS — K7581 Nonalcoholic steatohepatitis (NASH): Secondary | ICD-10-CM

## 2020-10-02 DIAGNOSIS — J9601 Acute respiratory failure with hypoxia: Principal | ICD-10-CM

## 2020-10-02 DIAGNOSIS — K7460 Unspecified cirrhosis of liver: Secondary | ICD-10-CM

## 2020-10-02 DIAGNOSIS — I480 Paroxysmal atrial fibrillation: Secondary | ICD-10-CM

## 2020-10-02 DIAGNOSIS — J471 Bronchiectasis with (acute) exacerbation: Secondary | ICD-10-CM

## 2020-10-02 DIAGNOSIS — R188 Other ascites: Secondary | ICD-10-CM

## 2020-10-02 LAB — COMPREHENSIVE METABOLIC PANEL
ALT: 25 U/L (ref 0–44)
AST: 35 U/L (ref 15–41)
Albumin: 3.3 g/dL — ABNORMAL LOW (ref 3.5–5.0)
Alkaline Phosphatase: 70 U/L (ref 38–126)
Anion gap: 9 (ref 5–15)
BUN: 32 mg/dL — ABNORMAL HIGH (ref 8–23)
CO2: 26 mmol/L (ref 22–32)
Calcium: 8.6 mg/dL — ABNORMAL LOW (ref 8.9–10.3)
Chloride: 103 mmol/L (ref 98–111)
Creatinine, Ser: 1.02 mg/dL — ABNORMAL HIGH (ref 0.44–1.00)
GFR, Estimated: 57 mL/min — ABNORMAL LOW (ref >60)
Glucose, Bld: 154 mg/dL — ABNORMAL HIGH (ref 70–99)
Potassium: 4.2 mmol/L (ref 3.5–5.1)
Sodium: 138 mmol/L (ref 135–145)
Total Bilirubin: 0.7 mg/dL (ref 0.3–1.2)
Total Protein: 6.6 g/dL (ref 6.5–8.1)

## 2020-10-02 LAB — CBC WITH DIFFERENTIAL/PLATELET
Abs Immature Granulocytes: 0.01 10*3/uL (ref 0.00–0.07)
Basophils Absolute: 0 10*3/uL (ref 0.0–0.1)
Basophils Relative: 0 %
Eosinophils Absolute: 0 10*3/uL (ref 0.0–0.5)
Eosinophils Relative: 0 %
HCT: 36.4 % (ref 36.0–46.0)
Hemoglobin: 12.2 g/dL (ref 12.0–15.0)
Immature Granulocytes: 0 %
Lymphocytes Relative: 22 %
Lymphs Abs: 0.8 10*3/uL (ref 0.7–4.0)
MCH: 30.3 pg (ref 26.0–34.0)
MCHC: 33.5 g/dL (ref 30.0–36.0)
MCV: 90.5 fL (ref 80.0–100.0)
Monocytes Absolute: 0.1 10*3/uL (ref 0.1–1.0)
Monocytes Relative: 4 %
Neutro Abs: 2.7 10*3/uL (ref 1.7–7.7)
Neutrophils Relative %: 74 %
Platelets: 95 10*3/uL — ABNORMAL LOW (ref 150–400)
RBC: 4.02 MIL/uL (ref 3.87–5.11)
RDW: 12.9 % (ref 11.5–15.5)
WBC: 3.7 10*3/uL — ABNORMAL LOW (ref 4.0–10.5)
nRBC: 0 % (ref 0.0–0.2)

## 2020-10-02 LAB — MAGNESIUM: Magnesium: 2.3 mg/dL (ref 1.7–2.4)

## 2020-10-02 LAB — PROCALCITONIN: Procalcitonin: <0.1 ng/mL

## 2020-10-02 LAB — HIV ANTIBODY (ROUTINE TESTING W REFLEX): HIV Screen 4th Generation wRfx: NONREACTIVE

## 2020-10-02 LAB — TSH: TSH: 0.138 u[IU]/mL — ABNORMAL LOW (ref 0.350–4.500)

## 2020-10-02 MED ORDER — SODIUM CHLORIDE 0.9% FLUSH: 10.0000 mL | INTRAVENOUS | Status: DC | PRN

## 2020-10-02 MED ORDER — IPRATROPIUM-ALBUTEROL 0.5-2.5 (3) MG/3ML IN SOLN: 3.0000 mL | Freq: Four times a day (QID) | RESPIRATORY_TRACT | Status: DC

## 2020-10-02 MED ORDER — METHYLPREDNISOLONE SODIUM SUCC 125 MG IJ SOLR
60.0000 mg | Freq: Two times a day (BID) | INTRAMUSCULAR | Status: DC
  Administered 2020-10-02 – 2020-10-03 (×3): 60 mg via INTRAVENOUS
  Filled 2020-10-02 (×3): qty 2

## 2020-10-02 MED ORDER — CHLORHEXIDINE GLUCONATE CLOTH 2 % EX PADS
6.0000 | MEDICATED_PAD | Freq: Every day | CUTANEOUS | Status: DC
  Administered 2020-10-02 – 2020-10-03 (×2): 6 via TOPICAL

## 2020-10-02 MED ORDER — SODIUM CHLORIDE 0.9% FLUSH
10.0000 mL | Freq: Two times a day (BID) | INTRAVENOUS | Status: DC
  Administered 2020-10-02 – 2020-10-03 (×2): 10 mL

## 2020-10-02 NOTE — ED Notes (Signed)
ED TO INPATIENT HANDOFF REPORT  ED Nurse Name and Phone #: [phone]  S Name/Age/Gender Tasha Roberts 79 y.o. female Room/Bed: APA01/APA01  Code Status   Code Status: DNR  Home/SNF/Other Home Patient oriented to: self, place, time and situation Is this baseline? Yes   Triage Complete: Triage complete  Chief Complaint Respiratory failure with hypoxia ([place]) [J96.91]  Triage Note Pt c/o of productive cough that started 1 week ago. Denies covid exposure.     Allergies Allergies  Allergen Reactions  . Metformin And Related     Level of Care/Admitting Diagnosis ED Disposition    ED Disposition Condition [place] [facility] Area: [facility] [[id number]]  Level of Care: Telemetry [5]  Covid Evaluation: Confirmed COVID Negative  Diagnosis: Respiratory failure with hypoxia [facility]) [[id number]]  Admitting Physician: Rolla Plate [[id number]]  Attending Physician: Rolla Plate [[id number]]  Estimated length of stay: past midnight tomorrow  Certification:: I certify this patient will need inpatient services for at least 2 midnights       B Medical/Surgery History Past Medical History:  Diagnosis Date  . A-fib ([place])   . Atrial fibrillation ([place])   . Cirrhosis [facility])    Past Surgical History:  Procedure Laterality Date  . BREAST SURGERY    . CHOLECYSTECTOMY       A IV Location/Drains/Wounds Patient Lines/Drains/Airways Status    Active Line/Drains/Airways    Name Placement date Placement time Site Days   Peripheral IV 10/01/20 Left Antecubital 10/01/20  1614  Antecubital  1   Peripheral IV 10/01/20 Left Hand 10/01/20  1616  Hand  1   Peripheral IV 10/01/20 Left Forearm 10/01/20  1727  Forearm  1   Wound / Incision (Open or Dehisced) 06/18/20 Laceration Hand Right;Other (Comment) dorsal aspect of hand 06/18/20  0929  Hand  106          Intake/Output Last 24 hours  Intake/Output Summary (Last 24 hours) at 10/02/2020 0900 Last data filed at  10/01/2020 2002 Gross per 24 hour  Intake 100 ml  Output --  Net 100 ml    Labs/Imaging Results for orders placed or performed during the hospital encounter of 10/01/20 (from the past 48 hour(s))  Respiratory Panel by RT PCR (Flu A&B, Covid) - Nasopharyngeal Swab     Status: None   Collection Time: 10/01/20  3:53 PM   Specimen: Nasopharyngeal Swab  Result Value Ref Range   SARS Coronavirus 2 by RT PCR NEGATIVE NEGATIVE    Comment: (NOTE) SARS-CoV-2 target nucleic acids are NOT DETECTED.  The SARS-CoV-2 RNA is generally detectable in upper respiratoy specimens during the acute phase of infection. The lowest concentration of SARS-CoV-2 viral copies this assay can detect is 131 copies/mL. A negative result does not preclude SARS-Cov-2 infection and should not be used as the sole basis for treatment or other patient management decisions. A negative result may occur with  improper specimen collection/handling, submission of specimen other than nasopharyngeal swab, presence of viral mutation(s) within the areas targeted by this assay, and inadequate number of viral copies (<131 copies/mL). A negative result must be combined with clinical observations, patient history, and epidemiological information. The expected result is Negative.  Fact Sheet for Patients:  PinkCheek.be  Fact Sheet for Healthcare Providers:  GravelBags.it  This test is no t yet approved or cleared by the [place] FDA and  has been authorized for detection and/or diagnosis of SARS-CoV-2 by FDA under an Emergency Use Authorization (EUA). This  EUA will remain  in effect (meaning this test can be used) for the duration of the COVID-19 declaration under Section 564(b)(1) of the Act, 21 U.S.C. section 360bbb-3(b)(1), unless the authorization is terminated or revoked sooner.     Influenza A by PCR NEGATIVE NEGATIVE   Influenza B by PCR NEGATIVE NEGATIVE     Comment: (NOTE) The Xpert Xpress SARS-CoV-2/FLU/RSV assay is intended as an aid in  the diagnosis of influenza from Nasopharyngeal swab specimens and  should not be used as a sole basis for treatment. Nasal washings and  aspirates are unacceptable for Xpert Xpress SARS-CoV-2/FLU/RSV  testing.  Fact Sheet for Patients: PinkCheek.be  Fact Sheet for Healthcare Providers: GravelBags.it  This test is not yet approved or cleared by the [place] FDA and  has been authorized for detection and/or diagnosis of SARS-CoV-2 by  FDA under an Emergency Use Authorization (EUA). This EUA will remain  in effect (meaning this test can be used) for the duration of the  Covid-19 declaration under Section 564(b)(1) of the Act, 21  U.S.C. section 360bbb-3(b)(1), unless the authorization is  terminated or revoked. Performed at [facility], [address], [place], [place] [address]   Basic metabolic panel     Status: Abnormal   Collection Time: 10/01/20  4:09 PM  Result Value Ref Range   Sodium 138 135 - 145 mmol/L   Potassium 3.8 3.5 - 5.1 mmol/L   Chloride 101 98 - 111 mmol/L   CO2 25 22 - 32 mmol/L   Glucose, Bld 105 (H) 70 - 99 mg/dL    Comment: Glucose reference range applies only to samples taken after fasting for at least 8 hours.   BUN 27 (H) 8 - 23 mg/dL   Creatinine, Ser 1.06 (H) 0.44 - 1.00 mg/dL   Calcium 9.2 8.9 - 10.3 mg/dL   GFR, Estimated 54 (L) >60 mL/min    Comment: (NOTE) Calculated using the CKD-EPI Creatinine Equation (2021)    Anion gap 12 5 - 15    Comment: Performed at [facility], [address], [place], [place] [address]  Brain natriuretic peptide     Status: Abnormal   Collection Time: 10/01/20  4:09 PM  Result Value Ref Range   B Natriuretic Peptide 107.0 (H) 0.0 - 100.0 pg/mL    Comment: Performed at [facility], [address], [place], [place] [address]  CBC with Differential     Status: Abnormal    Collection Time: 10/01/20  4:09 PM  Result Value Ref Range   WBC 6.2 4.0 - 10.5 K/uL   RBC 4.25 3.87 - 5.11 MIL/uL   Hemoglobin 12.9 12.0 - 15.0 g/dL   HCT 38.6 36 - 46 %   MCV 90.8 80.0 - 100.0 fL   MCH 30.4 26.0 - 34.0 pg   MCHC 33.4 30.0 - 36.0 g/dL   RDW 13.2 11.5 - 15.5 %   Platelets 117 (L) 150 - 400 K/uL    Comment: REPEATED TO VERIFY PLATELET COUNT CONFIRMED BY SMEAR SPECIMEN CHECKED FOR CLOTS    nRBC 0.0 0.0 - 0.2 %   Neutrophils Relative % 76 %   Neutro Abs 4.7 1.7 - 7.7 K/uL   Lymphocytes Relative 17 %   Lymphs Abs 1.0 0.7 - 4.0 K/uL   Monocytes Relative 6 %   Monocytes Absolute 0.4 0.1 - 1.0 K/uL   Eosinophils Relative 1 %   Eosinophils Absolute 0.0 0.0 - 0.5 K/uL   Basophils Relative 0 %  Basophils Absolute 0.0 0.0 - 0.1 K/uL   Immature Granulocytes 0 %   Abs Immature Granulocytes 0.02 0.00 - 0.07 K/uL    Comment: Performed at [facility], [address], [place], [place] [address]  Troponin I (High Sensitivity)     Status: None   Collection Time: 10/01/20  4:09 PM  Result Value Ref Range   Troponin I (High Sensitivity) 17 <18 ng/L    Comment: (NOTE) Elevated high sensitivity troponin I (hsTnI) values and significant  changes across serial measurements may suggest ACS but many other  chronic and acute conditions are known to elevate hsTnI results.  Refer to the "Links" section for chest pain algorithms and additional  guidance. Performed at [facility], [address], [place], [place] [address]   Culture, blood (routine x 2)     Status: None (Preliminary result)   Collection Time: 10/01/20  4:10 PM   Specimen: Left Antecubital; Blood  Result Value Ref Range   Specimen Description LEFT ANTECUBITAL    Special Requests      BOTTLES DRAWN AEROBIC AND ANAEROBIC Blood Culture adequate volume   Culture      NO GROWTH < 24 HOURS Performed at [facility], [address], [place], [place] [address]    Report Status PENDING   Lactic acid, plasma     Status:  None   Collection Time: 10/01/20  4:10 PM  Result Value Ref Range   Lactic Acid, Venous 1.0 0.5 - 1.9 mmol/L    Comment: Performed at [facility], [address] [place], [place] [address]  Culture, blood (routine x 2)     Status: None (Preliminary result)   Collection Time: 10/01/20  4:18 PM   Specimen: BLOOD RIGHT FOREARM  Result Value Ref Range   Specimen Description BLOOD RIGHT FOREARM    Special Requests      BOTTLES DRAWN AEROBIC AND ANAEROBIC Blood Culture adequate volume   Culture      NO GROWTH < 24 HOURS Performed at [facility], [address] [place], [place] [address]    Report Status PENDING   Troponin I (High Sensitivity)     Status: None   Collection Time: 10/01/20  6:10 PM  Result Value Ref Range   Troponin I (High Sensitivity) 15 <18 ng/L    Comment: (NOTE) Elevated high sensitivity troponin I (hsTnI) values and significant  changes across serial measurements may suggest ACS but many other  chronic and acute conditions are known to elevate hsTnI results.  Refer to the "Links" section for chest pain algorithms and additional  guidance. Performed at [facility], [address], [place], [place] [address]   Comprehensive metabolic panel     Status: Abnormal   Collection Time: 10/02/20  6:10 AM  Result Value Ref Range   Sodium 138 135 - 145 mmol/L   Potassium 4.2 3.5 - 5.1 mmol/L   Chloride 103 98 - 111 mmol/L   CO2 26 22 - 32 mmol/L   Glucose, Bld 154 (H) 70 - 99 mg/dL    Comment: Glucose reference range applies only to samples taken after fasting for at least 8 hours.   BUN 32 (H) 8 - 23 mg/dL   Creatinine, Ser 1.02 (H) 0.44 - 1.00 mg/dL   Calcium 8.6 (L) 8.9 - 10.3 mg/dL   Total Protein 6.6 6.5 - 8.1 g/dL   Albumin 3.3 (L) 3.5 - 5.0 g/dL   AST 35 15 - 41 U/L   ALT 25 0 -  44 U/L   Alkaline Phosphatase 70 38 - 126 U/L   Total Bilirubin 0.7 0.3 - 1.2 mg/dL   GFR, Estimated 57 (L) >60 mL/min    Comment: (NOTE) Calculated using the CKD-EPI Creatinine  Equation (2021)    Anion gap 9 5 - 15    Comment: Performed at [facility], [address], [place], [place] [address]  Magnesium     Status: None   Collection Time: 10/02/20  6:10 AM  Result Value Ref Range   Magnesium 2.3 1.7 - 2.4 mg/dL    Comment: Performed at [facility], [address], [place], [place] [address]  TSH     Status: Abnormal   Collection Time: 10/02/20  6:10 AM  Result Value Ref Range   TSH 0.138 (L) 0.350 - 4.500 uIU/mL    Comment: Performed by a 3rd Generation assay with a functional sensitivity of <=0.01 uIU/mL. Performed at [facility], [address] [place], [place] [address]   CBC WITH DIFFERENTIAL     Status: Abnormal   Collection Time: 10/02/20  6:10 AM  Result Value Ref Range   WBC 3.7 (L) 4.0 - 10.5 K/uL   RBC 4.02 3.87 - 5.11 MIL/uL   Hemoglobin 12.2 12.0 - 15.0 g/dL   HCT 36.4 36 - 46 %   MCV 90.5 80.0 - 100.0 fL   MCH 30.3 26.0 - 34.0 pg   MCHC 33.5 30.0 - 36.0 g/dL   RDW 12.9 11.5 - 15.5 %   Platelets 95 (L) 150 - 400 K/uL    Comment: REPEATED TO VERIFY PLATELET COUNT CONFIRMED BY SMEAR SPECIMEN CHECKED FOR CLOTS Immature Platelet Fraction may be clinically indicated, consider ordering this additional test [id number]    nRBC 0.0 0.0 - 0.2 %   Neutrophils Relative % 74 %   Neutro Abs 2.7 1.7 - 7.7 K/uL   Lymphocytes Relative 22 %   Lymphs Abs 0.8 0.7 - 4.0 K/uL   Monocytes Relative 4 %   Monocytes Absolute 0.1 0.1 - 1.0 K/uL   Eosinophils Relative 0 %   Eosinophils Absolute 0.0 0.0 - 0.5 K/uL   Basophils Relative 0 %   Basophils Absolute 0.0 0.0 - 0.1 K/uL   Immature Granulocytes 0 %   Abs Immature Granulocytes 0.01 0.00 - 0.07 K/uL    Comment: Performed at [facility], [address], [place],  [address]   DG Chest 2 View  Result Date: 10/01/2020 CLINICAL DATA:  Cough for 1 week. EXAM: CHEST - 2 VIEW COMPARISON:  None. FINDINGS: The lungs are clear. Heart size is normal. Aortic atherosclerosis. No pneumothorax or pleural  fluid. No acute or focal bony abnormality. Mild convex right thoracic scoliosis and multilevel degenerative change noted. Surgical clips are seen left axilla. IMPRESSION: No acute disease. Aortic Atherosclerosis (ICD10-I70.0). Electronically Signed   By: Inge Rise M.D.   On: 10/01/2020 14:01   CT Angio Chest PE W and/or Wo Contrast  Result Date: 10/01/2020 CLINICAL DATA:  Productive cough 1 week prior, denies COVID exposure, negative testing today EXAM: CT ANGIOGRAPHY CHEST WITH CONTRAST TECHNIQUE: Multidetector CT imaging of the chest was performed using the standard protocol during bolus administration of intravenous contrast. Multiplanar CT image reconstructions and MIPs were obtained to evaluate the vascular anatomy. CONTRAST:  111mL OMNIPAQUE IOHEXOL 350 MG/ML SOLN COMPARISON:  Radiograph 10/01/2020 FINDINGS: Cardiovascular: Satisfactory opacification the pulmonary arteries to the segmental level. No pulmonary artery filling defects are identified. Central pulmonary arteries are normal caliber. Normal heart size.  No pericardial effusion. Three-vessel coronary artery atherosclerosis. The aortic root is suboptimally assessed given cardiac pulsation artifact. Atherosclerotic plaque within the normal caliber aorta. No acute luminal abnormality of the imaged aorta. No periaortic stranding or hemorrhage. Normal 3 vessel branching of the aortic arch. Proximal great vessels are mildly calcified but otherwise unremarkable. No major venous abnormality. Mediastinum/Nodes: No mediastinal fluid or gas. Normal thyroid gland and thoracic inlet. No acute abnormality of the trachea or esophagus. No worrisome mediastinal, hilar or axillary adenopathy. Scattered subcentimeter partially calcified nodes are seen AP window and middle mediastinum. Lungs/Pleura: Diffuse airways thickening and scattered secretions are noted. Mid to upper lung predominant scarring and architectural distortion with mild bronchiectatic changes  are present. Some more bandlike areas of reticular opacity likely reflect further parenchymal scarring and/or subsegmental atelectasis. Some dependent atelectasis noted posteriorly. No pneumothorax. No effusion. Upper Abdomen: Post cholecystectomy. Prominence of the biliary tree possibly related to post cholecystectomy reservoir effect. Slightly nodular hepatic surface contour, could reflect stigmata of cirrhosis. No concerning focal liver lesion is seen. Splenomegaly is present. Cortical scarring seen in both kidneys, left greater than right. Partial fatty replacement of the pancreas. Upper abdominal atherosclerosis. Musculoskeletal: The osseous structures appear diffusely demineralized which may limit detection of small or nondisplaced fractures. No acute osseous abnormality or suspicious osseous lesion. Remote bilateral anterior rib deformities as well as healed deformity of the sternum with lucencies through the costal cartilages. Could correlate for history of prior injury or resuscitative efforts/CPR. Curvature of the thoracic spine. Multilevel degenerative changes are present in the imaged portions of the spine. Multilevel flowing anterior osteophytosis, compatible with features of diffuse idiopathic skeletal hyperostosis (DISH). No worrisome chest wall lesions. Review of the MIP images confirms the above findings. IMPRESSION: 1. No evidence of acute pulmonary artery embolism. 2. Diffuse airways thickening and scattered secretions are noted with mid to upper lung predominant scarring and architectural distortion with mild bronchiectatic changes. Findings are favored to reflect sequela of prior infection/inflammation with some partially calcified lymph nodes suggesting sequela of remote granulomatous disease as well. 3. No acute intrathoracic process. 4. Remote bilateral anterior rib deformities as well as healed deformity of the sternum with lucencies through the costal cartilages. Could correlate for history  of prior injury or resuscitative efforts/CPR. 5. Slightly nodular hepatic surface contour, compatible with stigmata of cirrhosis. Splenomegaly is present, suggestive of portal hypertension. 6. Cortical scarring in both kidneys, left greater than right. 7. Aortic Atherosclerosis (ICD10-I70.0). Electronically Signed   By: Lovena Le M.D.   On: 10/01/2020 18:07   DG Chest Port 1 View  Result Date: 10/02/2020 CLINICAL DATA:  Respiratory failure with hypoxia. EXAM: PORTABLE CHEST 1 VIEW COMPARISON:  10/01/2020 FINDINGS: Lungs are hyperexpanded Interstitial markings are diffusely coarsened with chronic features. The lungs are clear without focal pneumonia, edema, pneumothorax or pleural effusion. The cardiopericardial silhouette is within normal limits for size. Bones are diffusely demineralized. Telemetry leads overlie the chest. IMPRESSION: Emphysema without acute cardiopulmonary findings. Electronically Signed   By: Misty Stanley M.D.   On: 10/02/2020 06:09    Pending Labs Unresulted Labs (From admission, onward)          Start     Ordered   10/03/20 0500  Comprehensive metabolic panel  Tomorrow morning,   R       Question:  Specimen collection method  Answer:  Lab=Lab collect   10/02/20 0848   10/03/20 0500  CBC  Tomorrow morning,   R  Question:  Specimen collection method  Answer:  Lab=Lab collect   10/02/20 0848   10/02/20 0850  ANA w/Reflex if Positive  Once,   STAT        10/02/20 0849   10/02/20 0849  Procalcitonin - Baseline  ONCE - STAT,   STAT       Question:  Specimen collection method  Answer:  Lab=Lab collect   10/02/20 0848   10/02/20 0849  Alpha-1-antitrypsin  Once,   STAT        10/02/20 0849   10/02/20 0848  Respiratory Panel by PCR  (Respiratory virus panel with precautions)  Once,   R        10/02/20 0848   10/01/20 2107  HIV Antibody (routine testing w rflx)  (HIV Antibody (Routine testing w reflex) panel)  Once,   STAT        10/01/20 2107   10/01/20 2107   Culture, sputum-assessment  Once,   STAT        10/01/20 2107   Unscheduled  Hemoglobin A1c  Once,   R        10/02/20 0859          Vitals/Pain Today's Vitals   10/02/20 0722 10/02/20 0730 10/02/20 0800 10/02/20 0830  BP:  (!) 122/48 (!) 117/53 137/60  Pulse:  67 66 74  Resp:  18 18 19   Temp:      TempSrc:      SpO2:  95% 95% 91%  Weight:      Height:      PainSc: 0-No pain       Isolation Precautions Droplet precaution  Medications Medications  azithromycin (ZITHROMAX) 500 mg in sodium chloride 0.9 % 250 mL IVPB (500 mg Intravenous New Bag/Given 10/01/20 1841)  ipratropium-albuterol (DUONEB) 0.5-2.5 (3) MG/3ML nebulizer solution 3 mL (has no administration in time range)  chlorpheniramine-HYDROcodone (TUSSIONEX) 10-8 MG/5ML suspension 5 mL (has no administration in time range)  cefTRIAXone (ROCEPHIN) 1 g in sodium chloride 0.9 % 100 mL IVPB (has no administration in time range)  atorvastatin (LIPITOR) tablet 20 mg (20 mg Oral Not Given 10/01/20 2122)  diltiazem (CARDIZEM CD) 24 hr capsule 240 mg (240 mg Oral Not Given 10/01/20 2122)  FLUoxetine (PROZAC) capsule 20 mg (20 mg Oral Not Given 10/01/20 2123)  levothyroxine (SYNTHROID) tablet 150 mcg (150 mcg Oral Not Given 10/01/20 2123)  apixaban (ELIQUIS) tablet 5 mg (5 mg Oral Given 10/01/20 2156)  dextromethorphan-guaiFENesin ([place] DM) 30-600 MG per 12 hr tablet 1 tablet (1 tablet Oral Given 10/01/20 2157)  methylPREDNISolone sodium succinate (SOLU-MEDROL) 125 mg/2 mL injection 60 mg (has no administration in time range)  ipratropium-albuterol (DUONEB) 0.5-2.5 (3) MG/3ML nebulizer solution 3 mL (has no administration in time range)  ipratropium-albuterol (DUONEB) 0.5-2.5 (3) MG/3ML nebulizer solution 3 mL (3 mLs Nebulization Given 10/01/20 1814)  chlorpheniramine-HYDROcodone (TUSSIONEX) 10-8 MG/5ML suspension 5 mL (5 mLs Oral Given 10/01/20 1722)  iohexol (OMNIPAQUE) 350 MG/ML injection 100 mL (100 mLs Intravenous  Contrast Given 10/01/20 1739)  cefTRIAXone (ROCEPHIN) 1 g in sodium chloride 0.9 % 100 mL IVPB (0 g Intravenous Stopped 10/01/20 2002)    Mobility walks with device Moderate fall risk   Focused Assessments    R Recommendations: See Admitting Provider Note  Report given to:   Additional Notes:

## 2020-10-02 NOTE — ED Notes (Signed)
Report attempted x2

## 2020-10-02 NOTE — ED Notes (Signed)
Attempted report x1. 

## 2020-10-02 NOTE — Progress Notes (Signed)
Contacted by RN due to patient having questions about code status --daughter at bedside with patient --Advance care planning, including the explanation and discussion of advance directives was carried out with the patient and family.  Code status including explanations of "Full Code" and "DNR" and alternatives were discussed in detail.  Discussion of end-of-life issues including but not limited palliative care, hospice care and the concept of hospice, other end-of-life care options, power of attorney for health care decisions, living wills, and physician orders for life-sustaining treatment were also discussed with the patient and family.  Total face to face time 16 minutes. -patient and daughter want full code, full scope of tx at this time  Pt also refusing further blood draw by venipuncture;  Family requests PICC line for routine blood work  DTat

## 2020-10-02 NOTE — Progress Notes (Signed)
Peripherally Inserted Central Catheter Placement  The IV Nurse has discussed with the patient and/or persons authorized to consent for the patient, the purpose of this procedure and the potential benefits and risks involved with this procedure.  The benefits include less needle sticks, lab draws from the catheter, and the patient may be discharged home with the catheter. Risks include, but not limited to, infection, bleeding, blood clot (thrombus formation), and puncture of an artery; nerve damage and irregular heartbeat and possibility to perform a PICC exchange if needed/ordered by physician.  Alternatives to this procedure were also discussed.  Bard Power PICC patient education guide, fact sheet on infection prevention and patient information card has been provided to patient /or left at bedside.    PICC Placement Documentation  PICC Single Lumen 06/27/81 Right Basilic 40 cm 1 cm (Active)  Exposed Catheter (cm) 1 cm 10/02/20 1854  Site Assessment Clean;Dry;Intact 10/02/20 1854  Line Status Blood return noted;Saline locked 10/02/20 1854  Dressing Type Transparent;Securing device 10/02/20 1854  Dressing Status Clean;Dry;Intact 10/02/20 1854  Antimicrobial disc in place? Yes 10/02/20 1854  Safety Lock Not Applicable 88/33/74 4514  Dressing Change Due 10/09/20 10/02/20 1854       Frances Maywood 10/02/2020, 6:57 PM

## 2020-10-02 NOTE — Progress Notes (Signed)
PROGRESS NOTE  Tasha Roberts QZE:[id number] DOB: 10/19/1941 DOA: 10/01/2020 PCP: Neale Burly, MD  Brief History:  79 year old female with a history of NASH liver cirrhosis, breast cancer in August 2013, diabetes mellitus type 2, hypertension, hyperlipidemia, hypothyroidism s/p chemical ablation for Graves' disease, and vitamin D deficiency presenting with 1 week history of coughing and shortness of breath. The patient denied any fevers, chills, nausea, vomiting or direct abdominal pain patient has some chest discomfort that was pleuritic with her coughing. She denies any orthopnea, leg edema, or PND type symptoms. She denies any new medications. She has not had any recent travels. Notably, the patient has been exposed to a significant amount of secondhand smoke from her spouse as well as her daughter. She states that she visited her spouse in the nursing home slightly over 1 week ago. Apparently, 1 to 2 days after visiting her spouse who is also having upper respiratory type symptoms the patient developed her coughing. In the emergency department, the patient had low-grade temperature 99.4 F. She was hemodynamically stable. Oxygen saturation was initially 89% on room air. She was placed on 2 L with oxygen saturation 92-95%. BMP and CBC were essentially unremarkable except for thrombocytopenia of 117. BNP was 107. CTA chest was negative for PE but showed calcified mediastinal lymph nodes and diffuse airway thickening and scattered secretions. There was mid to upper lung bronchiectatic changes. There was scattered bandlike areas of reticular opacity likely representing scarring or atelectasis. Liver was cirrhotic appearing with splenomegaly.  Assessment/Plan: Acute respiratory failure with hypoxia -secondary to exacerbation of bronchiectasis -stable on 2 L nasal cannula -presented with oxygen saturation 89% on room air -wean oxygen for saturation greater 92% -personally reviewed chest  x-ray-slight increased interstitial markings and bibasilar interstitial changes -personally reviewed EKG--sinus rhythm, no ST S2 or change  Bronchiectasis exacerbation -start IV Solu-Medrol -patient will ultimately need outpatient pulmonary follow-up and PFTs -check ANA and alpha-1 antitrypsin -start duo nebs -start Pulmicort -check PCT  Paroxysmal atrial fibrillation -currently in sinus rhythm -continue diltiazem CD -continue apixaban  Diabetes mellitus type 2, controlled -hemoglobin A1c -anticipate elevated CBGs secondary to steroids -NovoLog sliding scale  Essential hypertension -continue diltiazem CD -holding enalapril secondary to soft blood pressure  Hypothyroidism -status post chemical ablation for Graves' disease -TSH 0.138 -continue current dose of Synthroid for now in the setting of acute illness -would repeat TSH in approximately 4 weeks  NASH liver cirrhosis -appears clinically compensated -patient follows with [facility]ology  Depression/anxiety -continue fluoxetine  Hyperlipidemia -continue statin      Status is: Inpatient  Remains inpatient appropriate because:IV treatments appropriate due to intensity of illness or inability to take PO   Dispo: The patient is from: Home              Anticipated d/c is to: Home              Anticipated d/c date is: 2 days              Patient currently is not medically stable to d/c.        Family Communication:   Daughter updated 10/26  Consultants: None  Code Status:  DNR--confirmed with the patient  DVT Prophylaxis: Apixaban   Procedures: As Listed in Progress Note Above  Antibiotics: Ceftriaxone 10/25>> azithro 10/25>>     Subjective: Patient states that she is breathing slightly better than yesterday. She continues to have a nonproductive cough. She denies  any fevers, chills, headache, chest pain, nausea, vomiting, diarrhea, abdominal pain, dysuria, hematuria.  Objective: Vitals:     10/02/20 0530 10/02/20 0717 10/02/20 0730 10/02/20 0800  BP: (!) 117/53 125/63 (!) 122/48 (!) 117/53  Pulse: 62 66 67 66  Resp: 16 16 18 18   Temp:      TempSrc:      SpO2: 95% 95% 95% 95%  Weight:      Height:        Intake/Output Summary (Last 24 hours) at 10/02/2020 0831 Last data filed at 10/01/2020 2002 Gross per 24 hour  Intake 100 ml  Output --  Net 100 ml   Weight change:  Exam:   General:  Pt is alert, follows commands appropriately, not in acute distress  HEENT: No icterus, No thrush, No neck mass, [place]/AT  Cardiovascular: RRR, S1/S2, no rubs, no gallops  Respiratory: Bilateral rales. Bilateral scattered wheeze.  Abdomen: Soft/+BS, non tender, non distended, no guarding  Extremities: No edema, No lymphangitis, No petechiae, No rashes, no synovitis   Data Reviewed: I have personally reviewed following labs and imaging studies Basic Metabolic Panel: Recent Labs  Lab 10/01/20 1609 10/02/20 0610  NA 138 138  K 3.8 4.2  CL 101 103  CO2 25 26  GLUCOSE 105* 154*  BUN 27* 32*  CREATININE 1.06* 1.02*  CALCIUM 9.2 8.6*  MG  --  2.3   Liver Function Tests: Recent Labs  Lab 10/02/20 0610  AST 35  ALT 25  ALKPHOS 70  BILITOT 0.7  PROT 6.6  ALBUMIN 3.3*   No results for input(s): LIPASE, AMYLASE in the last 168 hours. No results for input(s): AMMONIA in the last 168 hours. Coagulation Profile: No results for input(s): INR, PROTIME in the last 168 hours. CBC: Recent Labs  Lab 10/01/20 1609 10/02/20 0610  WBC 6.2 3.7*  NEUTROABS 4.7 2.7  HGB 12.9 12.2  HCT 38.6 36.4  MCV 90.8 90.5  PLT 117* 95*   Cardiac Enzymes: No results for input(s): CKTOTAL, CKMB, CKMBINDEX, TROPONINI in the last 168 hours. BNP: Invalid input(s): POCBNP CBG: No results for input(s): GLUCAP in the last 168 hours. HbA1C: No results for input(s): HGBA1C in the last 72 hours. Urine analysis: No results found for: COLORURINE, APPEARANCEUR, LABSPEC, PHURINE, GLUCOSEU,  HGBUR, BILIRUBINUR, KETONESUR, PROTEINUR, UROBILINOGEN, NITRITE, LEUKOCYTESUR Sepsis Labs: @LABRCNTIP (procalcitonin:4,lacticidven:4) ) Recent Results (from the past 240 hour(s))  Respiratory Panel by RT PCR (Flu A&B, Covid) - Nasopharyngeal Swab     Status: None   Collection Time: 10/01/20  3:53 PM   Specimen: Nasopharyngeal Swab  Result Value Ref Range Status   SARS Coronavirus 2 by RT PCR NEGATIVE NEGATIVE Final    Comment: (NOTE) SARS-CoV-2 target nucleic acids are NOT DETECTED.  The SARS-CoV-2 RNA is generally detectable in upper respiratoy specimens during the acute phase of infection. The lowest concentration of SARS-CoV-2 viral copies this assay can detect is 131 copies/mL. A negative result does not preclude SARS-Cov-2 infection and should not be used as the sole basis for treatment or other patient management decisions. A negative result may occur with  improper specimen collection/handling, submission of specimen other than nasopharyngeal swab, presence of viral mutation(s) within the areas targeted by this assay, and inadequate number of viral copies (<131 copies/mL). A negative result must be combined with clinical observations, patient history, and epidemiological information. The expected result is Negative.  Fact Sheet for Patients:  PinkCheek.be  Fact Sheet for Healthcare Providers:  GravelBags.it  This test is no  t yet approved or cleared by the [place] and  has been authorized for detection and/or diagnosis of SARS-CoV-2 by FDA under an Emergency Use Authorization (EUA). This EUA will remain  in effect (meaning this test can be used) for the duration of the COVID-19 declaration under Section 564(b)(1) of the Act, 21 U.S.C. section 360bbb-3(b)(1), unless the authorization is terminated or revoked sooner.     Influenza A by PCR NEGATIVE NEGATIVE Final   Influenza B by PCR NEGATIVE NEGATIVE  Final    Comment: (NOTE) The Xpert Xpress SARS-CoV-2/FLU/RSV assay is intended as an aid in  the diagnosis of influenza from Nasopharyngeal swab specimens and  should not be used as a sole basis for treatment. Nasal washings and  aspirates are unacceptable for Xpert Xpress SARS-CoV-2/FLU/RSV  testing.  Fact Sheet for Patients: PinkCheek.be  Fact Sheet for Healthcare Providers: GravelBags.it  This test is not yet approved or cleared by the [place] FDA and  has been authorized for detection and/or diagnosis of SARS-CoV-2 by  FDA under an Emergency Use Authorization (EUA). This EUA will remain  in effect (meaning this test can be used) for the duration of the  Covid-19 declaration under Section 564(b)(1) of the Act, 21  U.S.C. section 360bbb-3(b)(1), unless the authorization is  terminated or revoked. Performed at [facility], [address] [place] [address], [place], [place] [address]   Culture, blood (routine x 2)     Status: None (Preliminary result)   Collection Time: 10/01/20  4:10 PM   Specimen: Left Antecubital; Blood  Result Value Ref Range Status   Specimen Description LEFT ANTECUBITAL  Final   Special Requests   Final    BOTTLES DRAWN AEROBIC AND ANAEROBIC Blood Culture adequate volume   Culture   Final    NO GROWTH < 24 HOURS Performed at [facility], [address], [place], [place] [address]    Report Status PENDING  Incomplete  Culture, blood (routine x 2)     Status: None (Preliminary result)   Collection Time: 10/01/20  4:18 PM   Specimen: BLOOD RIGHT FOREARM  Result Value Ref Range Status   Specimen Description BLOOD RIGHT FOREARM  Final   Special Requests   Final    BOTTLES DRAWN AEROBIC AND ANAEROBIC Blood Culture adequate volume   Culture   Final    NO GROWTH < 24 HOURS Performed at [facility], [address], [place], [place] [address]    Report Status PENDING  Incomplete     Scheduled Meds: . apixaban   5 mg Oral BID  . atorvastatin  20 mg Oral Daily  . dextromethorphan-guaiFENesin  1 tablet Oral BID  . diltiazem  240 mg Oral Daily  . enalapril  2.5 mg Oral Daily  . FLUoxetine  20 mg Oral Daily  . levothyroxine  150 mcg Oral Daily  . methylPREDNISolone (SOLU-MEDROL) injection  60 mg Intravenous Q12H   Continuous Infusions: . azithromycin 500 mg (10/01/20 1841)  . cefTRIAXone (ROCEPHIN)  IV      Procedures/Studies: DG Chest 2 View  Result Date: 10/01/2020 CLINICAL DATA:  Cough for 1 week. EXAM: CHEST - 2 VIEW COMPARISON:  None. FINDINGS: The lungs are clear. Heart size is normal. Aortic atherosclerosis. No pneumothorax or pleural fluid. No acute or focal bony abnormality. Mild convex right thoracic scoliosis and multilevel degenerative change noted. Surgical clips are seen left axilla. IMPRESSION: No acute disease. Aortic Atherosclerosis (ICD10-I70.0). Electronically Signed   By: Inge Rise M.D.   On:  10/01/2020 14:01   CT Angio Chest PE W and/or Wo Contrast  Result Date: 10/01/2020 CLINICAL DATA:  Productive cough 1 week prior, denies COVID exposure, negative testing today EXAM: CT ANGIOGRAPHY CHEST WITH CONTRAST TECHNIQUE: Multidetector CT imaging of the chest was performed using the standard protocol during bolus administration of intravenous contrast. Multiplanar CT image reconstructions and MIPs were obtained to evaluate the vascular anatomy. CONTRAST:  158mL OMNIPAQUE IOHEXOL 350 MG/ML SOLN COMPARISON:  Radiograph 10/01/2020 FINDINGS: Cardiovascular: Satisfactory opacification the pulmonary arteries to the segmental level. No pulmonary artery filling defects are identified. Central pulmonary arteries are normal caliber. Normal heart size. No pericardial effusion. Three-vessel coronary artery atherosclerosis. The aortic root is suboptimally assessed given cardiac pulsation artifact. Atherosclerotic plaque within the normal caliber aorta. No acute luminal abnormality of the imaged  aorta. No periaortic stranding or hemorrhage. Normal 3 vessel branching of the aortic arch. Proximal great vessels are mildly calcified but otherwise unremarkable. No major venous abnormality. Mediastinum/Nodes: No mediastinal fluid or gas. Normal thyroid gland and thoracic inlet. No acute abnormality of the trachea or esophagus. No worrisome mediastinal, hilar or axillary adenopathy. Scattered subcentimeter partially calcified nodes are seen AP window and middle mediastinum. Lungs/Pleura: Diffuse airways thickening and scattered secretions are noted. Mid to upper lung predominant scarring and architectural distortion with mild bronchiectatic changes are present. Some more bandlike areas of reticular opacity likely reflect further parenchymal scarring and/or subsegmental atelectasis. Some dependent atelectasis noted posteriorly. No pneumothorax. No effusion. Upper Abdomen: Post cholecystectomy. Prominence of the biliary tree possibly related to post cholecystectomy reservoir effect. Slightly nodular hepatic surface contour, could reflect stigmata of cirrhosis. No concerning focal liver lesion is seen. Splenomegaly is present. Cortical scarring seen in both kidneys, left greater than right. Partial fatty replacement of the pancreas. Upper abdominal atherosclerosis. Musculoskeletal: The osseous structures appear diffusely demineralized which may limit detection of small or nondisplaced fractures. No acute osseous abnormality or suspicious osseous lesion. Remote bilateral anterior rib deformities as well as healed deformity of the sternum with lucencies through the costal cartilages. Could correlate for history of prior injury or resuscitative efforts/CPR. Curvature of the thoracic spine. Multilevel degenerative changes are present in the imaged portions of the spine. Multilevel flowing anterior osteophytosis, compatible with features of diffuse idiopathic skeletal hyperostosis (DISH). No worrisome chest wall lesions.  Review of the MIP images confirms the above findings. IMPRESSION: 1. No evidence of acute pulmonary artery embolism. 2. Diffuse airways thickening and scattered secretions are noted with mid to upper lung predominant scarring and architectural distortion with mild bronchiectatic changes. Findings are favored to reflect sequela of prior infection/inflammation with some partially calcified lymph nodes suggesting sequela of remote granulomatous disease as well. 3. No acute intrathoracic process. 4. Remote bilateral anterior rib deformities as well as healed deformity of the sternum with lucencies through the costal cartilages. Could correlate for history of prior injury or resuscitative efforts/CPR. 5. Slightly nodular hepatic surface contour, compatible with stigmata of cirrhosis. Splenomegaly is present, suggestive of portal hypertension. 6. Cortical scarring in both kidneys, left greater than right. 7. Aortic Atherosclerosis (ICD10-I70.0). Electronically Signed   By: Lovena Le M.D.   On: 10/01/2020 18:07   DG Chest Port 1 View  Result Date: 10/02/2020 CLINICAL DATA:  Respiratory failure with hypoxia. EXAM: PORTABLE CHEST 1 VIEW COMPARISON:  10/01/2020 FINDINGS: Lungs are hyperexpanded Interstitial markings are diffusely coarsened with chronic features. The lungs are clear without focal pneumonia, edema, pneumothorax or pleural effusion. The cardiopericardial silhouette is within normal limits for  size. Bones are diffusely demineralized. Telemetry leads overlie the chest. IMPRESSION: Emphysema without acute cardiopulmonary findings. Electronically Signed   By: Misty Stanley M.D.   On: 10/02/2020 06:09    Orson Eva, DO  [facility]  If 7PM-7AM, please contact night-coverage [email or web address] Password [email or web address] 10/02/2020, 8:31 AM   LOS: 1 day

## 2020-10-03 DIAGNOSIS — B974 Respiratory syncytial virus as the cause of diseases classified elsewhere: Secondary | ICD-10-CM

## 2020-10-03 LAB — RESPIRATORY PANEL BY PCR

## 2020-10-03 LAB — CBC
HCT: 32.7 % — ABNORMAL LOW (ref 36.0–46.0)
Hemoglobin: 10.9 g/dL — ABNORMAL LOW (ref 12.0–15.0)
MCH: 29.2 pg (ref 26.0–34.0)
MCHC: 33.3 g/dL (ref 30.0–36.0)
MCV: 87.7 fL (ref 80.0–100.0)
Platelets: 103 10*3/uL — ABNORMAL LOW (ref 150–400)
RBC: 3.73 MIL/uL — ABNORMAL LOW (ref 3.87–5.11)
RDW: 12.6 % (ref 11.5–15.5)
WBC: 6.3 10*3/uL (ref 4.0–10.5)
nRBC: 0 % (ref 0.0–0.2)

## 2020-10-03 LAB — ANA W/REFLEX IF POSITIVE: Anti Nuclear Antibody (ANA): NEGATIVE

## 2020-10-03 LAB — HEMOGLOBIN A1C
Hgb A1c MFr Bld: 5.8 % — ABNORMAL HIGH (ref 4.8–5.6)
Mean Plasma Glucose: 120 mg/dL

## 2020-10-03 LAB — COMPREHENSIVE METABOLIC PANEL
ALT: 23 U/L (ref 0–44)
AST: 30 U/L (ref 15–41)
Albumin: 2.9 g/dL — ABNORMAL LOW (ref 3.5–5.0)
Alkaline Phosphatase: 58 U/L (ref 38–126)
Anion gap: 7 (ref 5–15)
BUN: 41 mg/dL — ABNORMAL HIGH (ref 8–23)
CO2: 26 mmol/L (ref 22–32)
Calcium: 8.3 mg/dL — ABNORMAL LOW (ref 8.9–10.3)
Chloride: 103 mmol/L (ref 98–111)
Creatinine, Ser: 0.97 mg/dL (ref 0.44–1.00)
GFR, Estimated: >60 mL/min (ref >60)
Glucose, Bld: 177 mg/dL — ABNORMAL HIGH (ref 70–99)
Potassium: 3.9 mmol/L (ref 3.5–5.1)
Sodium: 136 mmol/L (ref 135–145)
Total Bilirubin: 0.5 mg/dL (ref 0.3–1.2)
Total Protein: 5.9 g/dL — ABNORMAL LOW (ref 6.5–8.1)

## 2020-10-03 LAB — ALPHA-1-ANTITRYPSIN: A-1 Antitrypsin, Ser: 181 mg/dL (ref 101–187)

## 2020-10-03 LAB — MRSA PCR SCREENING: MRSA by PCR: NEGATIVE

## 2020-10-03 MED ORDER — BENZONATATE 100 MG PO CAPS: 100.0000 mg | ORAL_CAPSULE | Freq: Three times a day (TID) | ORAL | 0 refills | Status: DC | PRN

## 2020-10-03 MED ORDER — AEROCHAMBER PLUS FLO-VU MISC
1.0000 | Freq: Once | Status: DC
  Filled 2020-10-03: qty 1

## 2020-10-03 MED ORDER — ALBUTEROL SULFATE HFA 108 (90 BASE) MCG/ACT IN AERS
1.0000 | INHALATION_SPRAY | Freq: Four times a day (QID) | RESPIRATORY_TRACT | 0 refills | Status: DC | PRN
Start: 2020-10-03 — End: 2021-01-24

## 2020-10-03 MED ORDER — PREDNISONE 10 MG PO TABS: ORAL_TABLET | ORAL | 0 refills | Status: AC

## 2020-10-03 MED ORDER — IPRATROPIUM-ALBUTEROL 0.5-2.5 (3) MG/3ML IN SOLN: 3.0000 mL | Freq: Four times a day (QID) | RESPIRATORY_TRACT | 0 refills | Status: DC | PRN

## 2020-10-03 NOTE — Progress Notes (Signed)
Discharge instructions reviewed with patient and patient's daughter. Both verbalized and demonstrated understanding of instructions. Patient discharged home with daughter in stable condition.

## 2020-10-03 NOTE — TOC Transition Note (Signed)
Transition of Care [facility]) - CM/SW Discharge Note  Patient Details  Name: Tasha Roberts MRN: [id number] Date of Birth: January 12, 1941  [facility]) CM/SW Contact:  Sherie Don, LCSW Phone Number: 10/03/2020, 2:03 PM  Clinical Narrative: TOC notified patient will need DME and follow up with pulmonology. CSW spoke with Abigail Butts at patient's PCP, Dr. Sherrie Sport, office. Per Abigail Butts, patient will need to be seen by her PCP to get a referral for pulmonology. PCP appointment scheduled for October 10, 2020 at 1pm. Appointment added to AVS. CSW spoke with daughter about PCP appointment and daughter agreeable to DME referral to [facility]. CSW made referral to [facility] with [facility]. Discharge summary faxed to PCP's office per [facility] request. TOC signing off.  Final next level of care: Home/Self Care Barriers to Discharge: Barriers Resolved  Patient Goals and CMS Choice Patient states their goals for this hospitalization and ongoing recovery are:: Discharge home CMS Medicare.gov Compare Post Acute Care list provided to:: Patient Represenative (must comment) Choice offered to / list presented to : Adult Children  Discharge Placement Name of family member notified: Duaine Dredge (daughter)  Discharge Plan and Services        DME Arranged: Nebulizer/meds, Oxygen DME Agency: [facility] Date DME Agency Contacted: 10/03/20 Time DME Agency Contacted: 40 Representative spoke with at DME Agency: [place]: NA  Readmission Risk Interventions No flowsheet data found.

## 2020-10-03 NOTE — Progress Notes (Addendum)
SATURATION QUALIFICATIONS: (This note is used to comply with regulatory documentation for home oxygen)  Patient Saturations on Room Air at Rest = 95%  Patient Saturations on Room Air while Ambulating = 86%  Patient Saturations on 2 Liters of oxygen while Ambulating = 96%

## 2020-10-03 NOTE — Discharge Instructions (Signed)
Tasha Roberts,  You were in the hospital because of trouble breathing. You required oxygen to help your body keep oxygen saturations elevated. This appears to be caused by RSV (a virus). You also have some evidence of underlying lung disease and recommend you follow-up with a lung doctor. You will go home with oxygen for use when walking (2 L per minute). Please follow-up with your GI doctors at [facility] with regard to your dark stools, however, if you have worsening and frequent black stools, please follow-up with your primary care physician or return to the emergency department for evaluation.

## 2020-10-03 NOTE — Plan of Care (Signed)

## 2020-10-03 NOTE — Discharge Summary (Signed)
Physician Discharge Summary  Tasha Roberts CXK:[id number] DOB: [birth year] DOA: 10/01/2020  PCP: Neale Burly, MD  Admit date: 10/01/2020 Discharge date: 10/03/2020  Admitted From: Home Disposition: Home  Recommendations for Outpatient Follow-up:  1. Follow up with PCP in 1 week 2. Please obtain BMP/CBC in one week 3. Outpatient pulmonology referral 4. Repeat TSH 5. Follow-up with GI for periodic melenotic sounding stool 6. Please follow up on the following pending results: alpha-1-antitrypsin, ANA  Home Health: None Equipment/Devices: Oxygen, Nebulizer  Discharge Condition: Stable CODE STATUS: Full code Diet recommendation: Heart healthy   Brief/Interim Summary:  Admission HPI written by [place], DO    HPI:   Tasha Roberts  is a 79 y.o. female, with history of atrial fibrillation and cirrhosis presents to the ED with a chief complaint of cough.  Patient reports that the cough started 6 days ago.  It is increased in frequency and intensity over those 6 days.  She reports that it was sounds wet, but she cannot bring any mucus up.  She is not had this before.  She has associated shortness of breath that is worse on exertion.  She has no fever, no shaking chills, no sick contacts.  She has been vaccinated for Covid.  She does not smoke, does not drink alcohol, and does not use illicit drugs.  Patient does report a associated chest pressure only while she is coughing.  It is nonexertional chest pressure.  It is in the center of her chest.  She reports that breathing treatment given in the ED has helped. Patient does not have a history of COPD or smoking. Patient has no other complaints at this time.  In the ED  Temperature 98.9, heart rate 82, respiratory rate 16, blood pressure 122/76, satting at 97% on 2 L nasal cannula No leukocytosis with white blood cell count of 6.2, CHEM panel is unremarkable BNP of 107, troponin of 17 and then 15, lactic acid 1.0 Blood  cultures pending CT shows no PE, does have changes associated with bronchiectasis EKG is a heart rate of 80, QTC of 464, sinus rhythm Patient was started on Zithromax and Rocephin. Is also given 1 dose of Tussionex, and 1 DuoNeb Oxygen saturations drop with exertion when transferring from wheelchair to bed = 87%   Hospital course:  Acute respiratory failure with hypoxia Secondary to RSV infection in setting of apparent underlying lung disease. CT chest concerning for emphysema, bronchiectasis and possible granulomatous disease. Patient weaned to room air at rest and required 2 Lpm of oxygen via [place] while ambulating. Recommend outpatient follow-up with pulmonology.   RSV infection Patient appears to have evidence of bronchiectasis on CT imaging. No prior history although this may have contributed to difficulty managing RSV infection. Patient empirically started on Ceftriaxone and azithromycin which have been discontinued on discharge. Procalcitonin is negative. Will discharge on short prednisone taper.  Paroxysmal atrial fibrillation Sinus rhythm. Continue Eliquis and Diltiazem.  Diabetes mellitus type 2, controlled Hemoglobin A1C of 5.8%. Patient is on glimepiride as an outpatient. No changes on discharge.  Essential hypertension Diltiazem CD. Patient list with both enalapril and lisinopril-hydrochlorothiazide. Discontinuing both as patient's blood pressure has been managed on diltiazem alone.   Hypothyroidism Status post chemical ablation for Graves' disease. TSH 0.138. No changes made to regimen. Asymptomatic. Recheck TSH as an outapient.  NASH liver cirrhosis Stable. Outpatient follow-up  Dark stools Appears to be an intermittently chronic issue. Mild drop in hemoglobin. Since this is chronic  and hemoglobin is relatively stable, have recommended for patient to follow-up with outpatient GI physician at [facility] or to return/see PCP sooner if frequent melenotic stools/worsening  symptoms. Attempted to review prior EGD but was unable to obtain records; patient reports no issues found.  Depression/anxiety Continue fluoxetine  Hyperlipidemia Continue Lipitor  Discharge Diagnoses:  Active Problems:   Respiratory failure with hypoxia (HCC)   Acute respiratory failure with hypoxia (HCC)   Bronchiectasis with (acute) exacerbation (HCC)   AF (paroxysmal atrial fibrillation) (HCC)   Liver cirrhosis secondary to NASH [facility])    Discharge Instructions  Discharge Instructions    Call MD for:  difficulty breathing, headache or visual disturbances   Complete by: As directed    Diet - low sodium heart healthy   Complete by: As directed    Increase activity slowly   Complete by: As directed      Allergies as of 10/03/2020      Reactions   Metformin And Related       Medication List    STOP taking these medications   enalapril 2.5 MG tablet Commonly known as: VASOTEC   lisinopril-hydrochlorothiazide 20-25 MG tablet Commonly known as: ZESTORETIC     TAKE these medications   albuterol 108 (90 Base) MCG/ACT inhaler Commonly known as: VENTOLIN HFA Inhale 1-2 puffs into the lungs every 6 (six) hours as needed for wheezing or shortness of breath.   atorvastatin 20 MG tablet Commonly known as: LIPITOR Take 20 mg by mouth daily.   benzonatate 100 MG capsule Commonly known as: Tessalon Perles Take 1 capsule (100 mg total) by mouth 3 (three) times daily as needed for cough.   Cholecalciferol 50 MCG (2000 UT) Caps Take 2,000 mg by mouth daily.   clotrimazole-betamethasone cream Commonly known as: LOTRISONE Apply topically 2 (two) times daily.   diltiazem 240 MG 24 hr capsule Commonly known as: CARDIZEM CD Take 240 mg by mouth daily.   Eliquis 5 MG Tabs tablet Generic drug: apixaban Take 1 tablet by mouth 2 (two) times daily.   FLUoxetine 20 MG tablet Commonly known as: PROZAC Take 20 mg by mouth daily.   glimepiride 2 MG tablet Commonly known  as: AMARYL Take by mouth.   ipratropium-albuterol 0.5-2.5 (3) MG/3ML Soln Commonly known as: DUONEB Take 3 mLs by nebulization every 6 (six) hours as needed (shortness of breath or wheezing).   levothyroxine 150 MCG tablet Commonly known as: SYNTHROID Take 150 mcg by mouth daily.   MEGARED OMEGA-3 KRILL OIL PO Take 1 capsule by mouth every morning.   nitroGLYCERIN 0.4 MG SL tablet Commonly known as: NITROSTAT Place 0.4 mg under the tongue every 5 (five) minutes as needed.   predniSONE 10 MG tablet Commonly known as: DELTASONE Take 4 tablets (40 mg total) by mouth daily for 2 days, THEN 3 tablets (30 mg total) daily for 1 day, THEN 2 tablets (20 mg total) daily for 1 day, THEN 1 tablet (10 mg total) daily for 2 days. Start taking on: October 04, 2020   topiramate 25 MG tablet Commonly known as: TOPAMAX Take 25 mg by mouth at bedtime.            Durable Medical Equipment  (From admission, onward)         Start     Ordered   10/03/20 1351  For home use only DME oxygen  Once       Question Answer Comment  Length of Need 6 Months   Mode or (  Route) Nasal cannula   Liters per Minute 2   Frequency Continuous (stationary and portable oxygen unit needed)   Oxygen delivery system Gas      10/03/20 1351   10/03/20 1304  For home use only DME Nebulizer machine  Once       Question Answer Comment  Patient needs a nebulizer to treat with the following condition Emphysema lung ([place])   Length of Need Lifetime      10/03/20 1304          Follow-up Information    Neale Burly, MD Follow up on 10/10/2020.   Specialty: Internal Medicine Why: Appointment time is 1:00pm Contact information: [place] [address] [phone]        [facility] Follow up.   Why: Oxygen DME Contact information: [address] [place] [address] [phone]              Allergies  Allergen Reactions  . Metformin And Related       Consultations:  None   Procedures/Studies: DG Chest 2 View  Result Date: 10/01/2020 CLINICAL DATA:  Cough for 1 week. EXAM: CHEST - 2 VIEW COMPARISON:  None. FINDINGS: The lungs are clear. Heart size is normal. Aortic atherosclerosis. No pneumothorax or pleural fluid. No acute or focal bony abnormality. Mild convex right thoracic scoliosis and multilevel degenerative change noted. Surgical clips are seen left axilla. IMPRESSION: No acute disease. Aortic Atherosclerosis (ICD10-I70.0). Electronically Signed   By: Inge Rise M.D.   On: 10/01/2020 14:01   CT Angio Chest PE W and/or Wo Contrast  Result Date: 10/01/2020 CLINICAL DATA:  Productive cough 1 week prior, denies COVID exposure, negative testing today EXAM: CT ANGIOGRAPHY CHEST WITH CONTRAST TECHNIQUE: Multidetector CT imaging of the chest was performed using the standard protocol during bolus administration of intravenous contrast. Multiplanar CT image reconstructions and MIPs were obtained to evaluate the vascular anatomy. CONTRAST:  146mL OMNIPAQUE IOHEXOL 350 MG/ML SOLN COMPARISON:  Radiograph 10/01/2020 FINDINGS: Cardiovascular: Satisfactory opacification the pulmonary arteries to the segmental level. No pulmonary artery filling defects are identified. Central pulmonary arteries are normal caliber. Normal heart size. No pericardial effusion. Three-vessel coronary artery atherosclerosis. The aortic root is suboptimally assessed given cardiac pulsation artifact. Atherosclerotic plaque within the normal caliber aorta. No acute luminal abnormality of the imaged aorta. No periaortic stranding or hemorrhage. Normal 3 vessel branching of the aortic arch. Proximal great vessels are mildly calcified but otherwise unremarkable. No major venous abnormality. Mediastinum/Nodes: No mediastinal fluid or gas. Normal thyroid gland and thoracic inlet. No acute abnormality of the trachea or esophagus. No worrisome mediastinal, hilar or axillary  adenopathy. Scattered subcentimeter partially calcified nodes are seen AP window and middle mediastinum. Lungs/Pleura: Diffuse airways thickening and scattered secretions are noted. Mid to upper lung predominant scarring and architectural distortion with mild bronchiectatic changes are present. Some more bandlike areas of reticular opacity likely reflect further parenchymal scarring and/or subsegmental atelectasis. Some dependent atelectasis noted posteriorly. No pneumothorax. No effusion. Upper Abdomen: Post cholecystectomy. Prominence of the biliary tree possibly related to post cholecystectomy reservoir effect. Slightly nodular hepatic surface contour, could reflect stigmata of cirrhosis. No concerning focal liver lesion is seen. Splenomegaly is present. Cortical scarring seen in both kidneys, left greater than right. Partial fatty replacement of the pancreas. Upper abdominal atherosclerosis. Musculoskeletal: The osseous structures appear diffusely demineralized which may limit detection of small or nondisplaced fractures. No acute osseous abnormality or suspicious osseous lesion.  Remote bilateral anterior rib deformities as well as healed deformity of the sternum with lucencies through the costal cartilages. Could correlate for history of prior injury or resuscitative efforts/CPR. Curvature of the thoracic spine. Multilevel degenerative changes are present in the imaged portions of the spine. Multilevel flowing anterior osteophytosis, compatible with features of diffuse idiopathic skeletal hyperostosis (DISH). No worrisome chest wall lesions. Review of the MIP images confirms the above findings. IMPRESSION: 1. No evidence of acute pulmonary artery embolism. 2. Diffuse airways thickening and scattered secretions are noted with mid to upper lung predominant scarring and architectural distortion with mild bronchiectatic changes. Findings are favored to reflect sequela of prior infection/inflammation with some  partially calcified lymph nodes suggesting sequela of remote granulomatous disease as well. 3. No acute intrathoracic process. 4. Remote bilateral anterior rib deformities as well as healed deformity of the sternum with lucencies through the costal cartilages. Could correlate for history of prior injury or resuscitative efforts/CPR. 5. Slightly nodular hepatic surface contour, compatible with stigmata of cirrhosis. Splenomegaly is present, suggestive of portal hypertension. 6. Cortical scarring in both kidneys, left greater than right. 7. Aortic Atherosclerosis (ICD10-I70.0). Electronically Signed   By: Lovena Le M.D.   On: 10/01/2020 18:07   DG Chest Port 1 View  Result Date: 10/02/2020 CLINICAL DATA:  Respiratory failure with hypoxia. EXAM: PORTABLE CHEST 1 VIEW COMPARISON:  10/01/2020 FINDINGS: Lungs are hyperexpanded Interstitial markings are diffusely coarsened with chronic features. The lungs are clear without focal pneumonia, edema, pneumothorax or pleural effusion. The cardiopericardial silhouette is within normal limits for size. Bones are diffusely demineralized. Telemetry leads overlie the chest. IMPRESSION: Emphysema without acute cardiopulmonary findings. Electronically Signed   By: Misty Stanley M.D.   On: 10/02/2020 06:09   [place] EKG SITE RITE  Result Date: 10/02/2020 If Site Rite image not attached, placement could not be confirmed due to current cardiac rhythm.    Subjective: Some coughing this morning without productive sputum. Also noted some dark stool this morning.  Discharge Exam: Vitals:   10/03/20 0556 10/03/20 1300  BP: (!) 119/38 (!) 129/57  Pulse: 62 71  Resp: 18 18  Temp: 97.7 F (36.5 C) 98.6 F (37 C)  SpO2: 95% 98%   Vitals:   10/02/20 1612 10/02/20 2107 10/03/20 0556 10/03/20 1300  BP: 130/72 (!) 128/58 (!) 119/38 (!) 129/57  Pulse: 72 75 62 71  Resp: 18 18 18 18   Temp: 98.4 F (36.9 C) 98.8 F (37.1 C) 97.7 F (36.5 C) 98.6 F (37 C)  TempSrc:  Oral Oral    SpO2: 96% 93% 95% 98%  Weight:      Height:        General: Pt is alert, awake, not in acute distress Cardiovascular: RRR, S1/S2 +, no rubs, no gallops Respiratory: CTA bilaterally, no wheezing, no rhonchi Abdominal: Soft, NT, ND, bowel sounds + Extremities: no edema, no cyanosis    The results of significant diagnostics from this hospitalization (including imaging, microbiology, ancillary and laboratory) are listed below for reference.     Microbiology: Recent Results (from the past 240 hour(s))  Respiratory Panel by RT PCR (Flu A&B, Covid) - Nasopharyngeal Swab     Status: None   Collection Time: 10/01/20  3:53 PM   Specimen: Nasopharyngeal Swab  Result Value Ref Range Status   SARS Coronavirus 2 by RT PCR NEGATIVE NEGATIVE Final    Comment: (NOTE) SARS-CoV-2 target nucleic acids are NOT DETECTED.  The SARS-CoV-2 RNA is generally detectable in upper respiratoy  specimens during the acute phase of infection. The lowest concentration of SARS-CoV-2 viral copies this assay can detect is 131 copies/mL. A negative result does not preclude SARS-Cov-2 infection and should not be used as the sole basis for treatment or other patient management decisions. A negative result may occur with  improper specimen collection/handling, submission of specimen other than nasopharyngeal swab, presence of viral mutation(s) within the areas targeted by this assay, and inadequate number of viral copies (<131 copies/mL). A negative result must be combined with clinical observations, patient history, and epidemiological information. The expected result is Negative.  Fact Sheet for Patients:  PinkCheek.be  Fact Sheet for Healthcare Providers:  GravelBags.it  This test is no t yet approved or cleared by the [place] FDA and  has been authorized for detection and/or diagnosis of SARS-CoV-2 by FDA under an Emergency Use  Authorization (EUA). This EUA will remain  in effect (meaning this test can be used) for the duration of the COVID-19 declaration under Section 564(b)(1) of the Act, 21 U.S.C. section 360bbb-3(b)(1), unless the authorization is terminated or revoked sooner.     Influenza A by PCR NEGATIVE NEGATIVE Final   Influenza B by PCR NEGATIVE NEGATIVE Final    Comment: (NOTE) The Xpert Xpress SARS-CoV-2/FLU/RSV assay is intended as an aid in  the diagnosis of influenza from Nasopharyngeal swab specimens and  should not be used as a sole basis for treatment. Nasal washings and  aspirates are unacceptable for Xpert Xpress SARS-CoV-2/FLU/RSV  testing.  Fact Sheet for Patients: PinkCheek.be  Fact Sheet for Healthcare Providers: GravelBags.it  This test is not yet approved or cleared by the [place] FDA and  has been authorized for detection and/or diagnosis of SARS-CoV-2 by  FDA under an Emergency Use Authorization (EUA). This EUA will remain  in effect (meaning this test can be used) for the duration of the  Covid-19 declaration under Section 564(b)(1) of the Act, 21  U.S.C. section 360bbb-3(b)(1), unless the authorization is  terminated or revoked. Performed at [facility], [address], [place], [place] [address]   Culture, blood (routine x 2)     Status: None (Preliminary result)   Collection Time: 10/01/20  4:10 PM   Specimen: Left Antecubital; Blood  Result Value Ref Range Status   Specimen Description LEFT ANTECUBITAL  Final   Special Requests   Final    BOTTLES DRAWN AEROBIC AND ANAEROBIC Blood Culture adequate volume   Culture   Final    NO GROWTH 2 DAYS Performed at [facility], [address] [place], [place] [address]    Report Status PENDING  Incomplete  Culture, blood (routine x 2)     Status: None (Preliminary result)   Collection Time: 10/01/20  4:18 PM   Specimen: BLOOD RIGHT FOREARM  Result Value Ref  Range Status   Specimen Description BLOOD RIGHT FOREARM  Final   Special Requests   Final    BOTTLES DRAWN AEROBIC AND ANAEROBIC Blood Culture adequate volume   Culture   Final    NO GROWTH 2 DAYS Performed at [facility], [address], [place],  [address]    Report Status PENDING  Incomplete  Respiratory Panel by PCR     Status: Abnormal   Collection Time: 10/02/20 10:59 PM   Specimen: Nasopharyngeal Swab; Respiratory  Result Value Ref Range Status   Adenovirus NOT DETECTED NOT DETECTED Final   Coronavirus 229E NOT DETECTED NOT DETECTED Final    Comment: (NOTE) The Coronavirus on  the Respiratory Panel, DOES NOT test for the novel  Coronavirus (2019 nCoV)    Coronavirus HKU1 NOT DETECTED NOT DETECTED Final   Coronavirus NL63 NOT DETECTED NOT DETECTED Final   Coronavirus OC43 NOT DETECTED NOT DETECTED Final   Metapneumovirus NOT DETECTED NOT DETECTED Final   Rhinovirus / Enterovirus NOT DETECTED NOT DETECTED Final   Influenza A NOT DETECTED NOT DETECTED Final   Influenza B NOT DETECTED NOT DETECTED Final   Parainfluenza Virus 1 NOT DETECTED NOT DETECTED Final   Parainfluenza Virus 2 NOT DETECTED NOT DETECTED Final   Parainfluenza Virus 3 NOT DETECTED NOT DETECTED Final   Parainfluenza Virus 4 NOT DETECTED NOT DETECTED Final   Respiratory Syncytial Virus DETECTED (A) NOT DETECTED Final   Bordetella pertussis NOT DETECTED NOT DETECTED Final   Chlamydophila pneumoniae NOT DETECTED NOT DETECTED Final   Mycoplasma pneumoniae NOT DETECTED NOT DETECTED Final    Comment: Performed at [facility], [place] [address], [place], [place] [address]  MRSA PCR Screening     Status: None   Collection Time: 10/02/20 10:59 PM   Specimen: Nasal Mucosa; Nasopharyngeal  Result Value Ref Range Status   MRSA by PCR NEGATIVE NEGATIVE Final    Comment:        The GeneXpert MRSA Assay (FDA approved for NASAL specimens only), is one component of a comprehensive MRSA  colonization surveillance program. It is not intended to diagnose MRSA infection nor to guide or monitor treatment for MRSA infections. Performed at [facility], [address], [place],  [address]      Labs: BNP (last 3 results) Recent Labs    10/01/20 1609  BNP 063.0*   Basic Metabolic Panel: Recent Labs  Lab 10/01/20 1609 10/02/20 0610 10/03/20 0505  NA 138 138 136  K 3.8 4.2 3.9  CL 101 103 103  CO2 25 26 26   GLUCOSE 105* 154* 177*  BUN 27* 32* 41*  CREATININE 1.06* 1.02* 0.97  CALCIUM 9.2 8.6* 8.3*  MG  --  2.3  --    Liver Function Tests: Recent Labs  Lab 10/02/20 0610 10/03/20 0505  AST 35 30  ALT 25 23  ALKPHOS 70 58  BILITOT 0.7 0.5  PROT 6.6 5.9*  ALBUMIN 3.3* 2.9*   No results for input(s): LIPASE, AMYLASE in the last 168 hours. No results for input(s): AMMONIA in the last 168 hours. CBC: Recent Labs  Lab 10/01/20 1609 10/02/20 0610 10/03/20 0505  WBC 6.2 3.7* 6.3  NEUTROABS 4.7 2.7  --   HGB 12.9 12.2 10.9*  HCT 38.6 36.4 32.7*  MCV 90.8 90.5 87.7  PLT 117* 95* 103*   Cardiac Enzymes: No results for input(s): CKTOTAL, CKMB, CKMBINDEX, TROPONINI in the last 168 hours. BNP: Invalid input(s): POCBNP CBG: No results for input(s): GLUCAP in the last 168 hours. D-Dimer No results for input(s): DDIMER in the last 72 hours. Hgb A1c Recent Labs    10/02/20 0610  HGBA1C 5.8*   Lipid Profile No results for input(s): CHOL, HDL, LDLCALC, TRIG, CHOLHDL, LDLDIRECT in the last 72 hours. Thyroid function studies Recent Labs    10/02/20 0610  TSH 0.138*   Anemia work up No results for input(s): VITAMINB12, FOLATE, FERRITIN, TIBC, IRON, RETICCTPCT in the last 72 hours. Urinalysis No results found for: COLORURINE, APPEARANCEUR, [place], [place], GLUCOSEU, [place], [place], KETONESUR, PROTEINUR, UROBILINOGEN, NITRITE, LEUKOCYTESUR Sepsis Labs Invalid input(s): PROCALCITONIN,  WBC,  LACTICIDVEN Microbiology Recent Results (from  the past 240 hour(s))  Respiratory Panel  by RT PCR (Flu A&B, Covid) - Nasopharyngeal Swab     Status: None   Collection Time: 10/01/20  3:53 PM   Specimen: Nasopharyngeal Swab  Result Value Ref Range Status   SARS Coronavirus 2 by RT PCR NEGATIVE NEGATIVE Final    Comment: (NOTE) SARS-CoV-2 target nucleic acids are NOT DETECTED.  The SARS-CoV-2 RNA is generally detectable in upper respiratoy specimens during the acute phase of infection. The lowest concentration of SARS-CoV-2 viral copies this assay can detect is 131 copies/mL. A negative result does not preclude SARS-Cov-2 infection and should not be used as the sole basis for treatment or other patient management decisions. A negative result may occur with  improper specimen collection/handling, submission of specimen other than nasopharyngeal swab, presence of viral mutation(s) within the areas targeted by this assay, and inadequate number of viral copies (<131 copies/mL). A negative result must be combined with clinical observations, patient history, and epidemiological information. The expected result is Negative.  Fact Sheet for Patients:  PinkCheek.be  Fact Sheet for Healthcare Providers:  GravelBags.it  This test is no t yet approved or cleared by the [place] FDA and  has been authorized for detection and/or diagnosis of SARS-CoV-2 by FDA under an Emergency Use Authorization (EUA). This EUA will remain  in effect (meaning this test can be used) for the duration of the COVID-19 declaration under Section 564(b)(1) of the Act, 21 U.S.C. section 360bbb-3(b)(1), unless the authorization is terminated or revoked sooner.     Influenza A by PCR NEGATIVE NEGATIVE Final   Influenza B by PCR NEGATIVE NEGATIVE Final    Comment: (NOTE) The Xpert Xpress SARS-CoV-2/FLU/RSV assay is intended as an aid in  the diagnosis of influenza from Nasopharyngeal swab specimens and   should not be used as a sole basis for treatment. Nasal washings and  aspirates are unacceptable for Xpert Xpress SARS-CoV-2/FLU/RSV  testing.  Fact Sheet for Patients: PinkCheek.be  Fact Sheet for Healthcare Providers: GravelBags.it  This test is not yet approved or cleared by the [place] FDA and  has been authorized for detection and/or diagnosis of SARS-CoV-2 by  FDA under an Emergency Use Authorization (EUA). This EUA will remain  in effect (meaning this test can be used) for the duration of the  Covid-19 declaration under Section 564(b)(1) of the Act, 21  U.S.C. section 360bbb-3(b)(1), unless the authorization is  terminated or revoked. Performed at [facility], [address], [place], [place] [address]   Culture, blood (routine x 2)     Status: None (Preliminary result)   Collection Time: 10/01/20  4:10 PM   Specimen: Left Antecubital; Blood  Result Value Ref Range Status   Specimen Description LEFT ANTECUBITAL  Final   Special Requests   Final    BOTTLES DRAWN AEROBIC AND ANAEROBIC Blood Culture adequate volume   Culture   Final    NO GROWTH 2 DAYS Performed at [facility], [address], [place], [place] [address]    Report Status PENDING  Incomplete  Culture, blood (routine x 2)     Status: None (Preliminary result)   Collection Time: 10/01/20  4:18 PM   Specimen: BLOOD RIGHT FOREARM  Result Value Ref Range Status   Specimen Description BLOOD RIGHT FOREARM  Final   Special Requests   Final    BOTTLES DRAWN AEROBIC AND ANAEROBIC Blood Culture adequate volume   Culture   Final    NO GROWTH 2 DAYS Performed at [facility], [address]  [place], [place] [address]    Report Status PENDING  Incomplete  Respiratory Panel by PCR     Status: Abnormal   Collection Time: 10/02/20 10:59 PM   Specimen: Nasopharyngeal Swab; Respiratory  Result Value Ref Range Status   Adenovirus NOT DETECTED NOT DETECTED  Final   Coronavirus 229E NOT DETECTED NOT DETECTED Final    Comment: (NOTE) The Coronavirus on the Respiratory Panel, DOES NOT test for the novel  Coronavirus (2019 nCoV)    Coronavirus HKU1 NOT DETECTED NOT DETECTED Final   Coronavirus NL63 NOT DETECTED NOT DETECTED Final   Coronavirus OC43 NOT DETECTED NOT DETECTED Final   Metapneumovirus NOT DETECTED NOT DETECTED Final   Rhinovirus / Enterovirus NOT DETECTED NOT DETECTED Final   Influenza A NOT DETECTED NOT DETECTED Final   Influenza B NOT DETECTED NOT DETECTED Final   Parainfluenza Virus 1 NOT DETECTED NOT DETECTED Final   Parainfluenza Virus 2 NOT DETECTED NOT DETECTED Final   Parainfluenza Virus 3 NOT DETECTED NOT DETECTED Final   Parainfluenza Virus 4 NOT DETECTED NOT DETECTED Final   Respiratory Syncytial Virus DETECTED (A) NOT DETECTED Final   Bordetella pertussis NOT DETECTED NOT DETECTED Final   Chlamydophila pneumoniae NOT DETECTED NOT DETECTED Final   Mycoplasma pneumoniae NOT DETECTED NOT DETECTED Final    Comment: Performed at [facility], [place]. [address], [place], [place] [address]  MRSA PCR Screening     Status: None   Collection Time: 10/02/20 10:59 PM   Specimen: Nasal Mucosa; Nasopharyngeal  Result Value Ref Range Status   MRSA by PCR NEGATIVE NEGATIVE Final    Comment:        The GeneXpert MRSA Assay (FDA approved for NASAL specimens only), is one component of a comprehensive MRSA colonization surveillance program. It is not intended to diagnose MRSA infection nor to guide or monitor treatment for MRSA infections. Performed at [facility], [address], [place], [place] [address]      Time coordinating discharge: 35 minutes  SIGNED:   Cordelia Poche, MD [facility] 10/03/2020, 2:27 PM

## 2020-10-06 LAB — CULTURE, BLOOD (ROUTINE X 2)
Culture: NO GROWTH
Culture: NO GROWTH
Special Requests: ADEQUATE
Special Requests: ADEQUATE

## 2020-11-15 ENCOUNTER — Encounter: Payer: Self-pay | Admitting: Pulmonary Disease

## 2020-11-15 ENCOUNTER — Other Ambulatory Visit: Payer: Self-pay

## 2020-11-15 ENCOUNTER — Ambulatory Visit (INDEPENDENT_AMBULATORY_CARE_PROVIDER_SITE_OTHER): Payer: Medicare Other | Admitting: Pulmonary Disease

## 2020-11-15 VITALS — BP 110/58 | HR 56 | Temp 97.5°F | Ht 64.0 in | Wt 135.0 lb

## 2020-11-15 DIAGNOSIS — J9611 Chronic respiratory failure with hypoxia: Secondary | ICD-10-CM

## 2020-11-15 DIAGNOSIS — Z862 Personal history of diseases of the blood and blood-forming organs and certain disorders involving the immune mechanism: Secondary | ICD-10-CM

## 2020-11-15 DIAGNOSIS — J479 Bronchiectasis, uncomplicated: Secondary | ICD-10-CM

## 2020-11-15 NOTE — Progress Notes (Signed)
[facility]  Chief Complaint  Patient presents with  . Consult    Shortness of breath with exertion    Constitutional:  BP (!) 110/58 (BP Location: Left Arm, Cuff Size: Normal)   Pulse (!) 56   Temp (!) 97.5 F (36.4 C) (Other (Comment)) Comment (Src): wrist  Ht 5\' 4"  (1.626 m)   Wt 135 lb (61.2 kg)   SpO2 100% Comment: Room air  BMI 23.17 kg/m   Past Medical History:  A fib, NASH with Cirrhosis, DM type 2, HTN, Hypothyroidism, Depression, Anxiety, HLD, Sarcoidosis  Past Surgical History:  Her  has a past surgical history that includes Cholecystectomy and Breast surgery.  Brief Summary:  Tasha Roberts is a 79 y.o. female never smoker with bronchiectasis.  She has history of sarcoidosis when she was in her 21's.       Subjective:   She is here with her daughter.   She is originally from [place] and was living in [place].  She moved to [place] recently to be closer to family to get help caring for her husband who has advanced dementia.  She was diagnosed with sarcoidosis when she was in her 63's.  She was treated with several months of prednisone then.  She was in hospital in October 2021 with cough from RSV respiratory infection.  CT chest showed changes of bronchiectasis.  She needed 2 liters oxygen with exertion.  She no longer is having a cough.  She is not having chest congestion, fever, wheeze, hemoptysis, chest pain, or joint swelling.  No prior history of pneumonia or exposure to tuberculosis.  She has a International aid/development worker and dog.  Physical Exam:   Appearance - well kempt   ENMT - no sinus tenderness, no oral exudate, no LAN, Mallampati 2 airway, no stridor, wears dentures  Respiratory - equal breath sounds bilaterally, no wheezing or rales  CV - s1s2 regular rate and rhythm, no murmurs  Ext - no clubbing, no edema  Skin - no rashes  Psych - normal mood and affect   Pulmonary testing:   A1AT 10/02/20 >>  181  Chest Imaging:   CT angio chest 10/01/20 >> diffuse airway thickening, mild upper lobe predominant bronchiectatic changes, calcified lymph nodes, changes of cirrhosis with splenomegaly  Sleep Tests:    Cardiac Tests:   Echo 12/14/19 >> EF 60 to 65%, grade 2 DD, mild LVH, mod elevation in PASP  Social History:  She  reports that she has never smoked. She has never used smokeless tobacco. She reports previous alcohol use. She reports previous drug use.  Family History:  Her family history is not on file.    Discussion:  She was in hospital in October 2021 with RSV respiratory infection associated with hypoxia.  She was found to have mild upper lobe predominant bronchiectasis with calcified lymph nodes.  She reports history of sarcoidosis when she was in her 69's and her CT chest findings are likely after effects from sarcoidosis.    Assessment/Plan:   Bronchiectasis. - mild upper lobe changes - likely related to prior history of sarcoidosis - will check pulmonary function test - she was not able to tolerate symbicort >> will discontinue this - she did not have oxygen desaturation with exertion on room air today; advised her to monitor oxygen level at home and if low, then might need to schedule 6 minutes walk test to further assess oxygen needs - will arrange for overnight oxygen  test to determine if she might need supplemental oxygen at night  NASH with cirrhosis. - followed by Dr. Laurier Nancy with [facility]  Atrial fibrillation, non obstructive CAD. - followed by Dr. Suann Larry with [facility]  Time Spent Involved in Patient Care on Day of Examination:  47 minutes  Follow up:  Patient Instructions  Will arrange for pulmonary function test  Will arrange for overnight oxygen test  Follow up in 8 weeks   Medication List:   Allergies as of 11/15/2020      Reactions   Dicyclomine Other (See Comments)   Metformin And Related    Losartan  Other (See Comments)   Heartburn      Medication List       Accurate as of November 15, 2020 11:38 AM. If you have any questions, ask your nurse or doctor.        STOP taking these medications   benzonatate 100 MG capsule Commonly known as: Best boy Stopped by: [name], MD   budesonide-formoterol 80-4.5 MCG/ACT inhaler Commonly known as: SYMBICORT Stopped by: [name], MD   FLUoxetine 20 MG tablet Commonly known as: PROZAC Stopped by: [name], MD   glimepiride 2 MG tablet Commonly known as: AMARYL Stopped by: [name], MD   lisinopril-hydrochlorothiazide 20-25 MG tablet Commonly known as: ZESTORETIC Stopped by: [name], MD   topiramate 25 MG tablet Commonly known as: TOPAMAX Stopped by: [name], MD     TAKE these medications   albuterol 108 (90 Base) MCG/ACT inhaler Commonly known as: VENTOLIN HFA Inhale 1-2 puffs into the lungs every 6 (six) hours as needed for wheezing or shortness of breath.   apixaban 5 MG Tabs tablet Commonly known as: ELIQUIS Take 1 tablet by mouth 2 (two) times daily.   atorvastatin 20 MG tablet Commonly known as: LIPITOR Take 20 mg by mouth daily.   Cholecalciferol 50 MCG (2000 UT) Caps Take 2,000 mg by mouth daily.   clotrimazole-betamethasone cream Commonly known as: LOTRISONE Apply topically 2 (two) times daily.   diltiazem 240 MG 24 hr capsule Commonly known as: CARDIZEM CD Take 240 mg by mouth daily.   ipratropium-albuterol 0.5-2.5 (3) MG/3ML Soln Commonly known as: DUONEB Take 3 mLs by nebulization every 6 (six) hours as needed (shortness of breath or wheezing).   levothyroxine 100 MCG tablet Commonly known as: SYNTHROID Take 100 mcg by mouth daily before breakfast. What changed: Another medication with the same name was removed. Continue taking this medication, and follow the directions you see here. Changed by: [name], MD   lisinopril 20 MG tablet Commonly known as: ZESTRIL Take  20 mg by mouth daily.   MEGARED OMEGA-3 KRILL OIL PO Take 1 capsule by mouth every morning.   nitroGLYCERIN 0.4 MG SL tablet Commonly known as: NITROSTAT Place 0.4 mg under the tongue every 5 (five) minutes as needed.       Signature:  [name], MD [facility] Pager - [phone] 11/15/2020, 11:38 AM

## 2020-11-15 NOTE — Patient Instructions (Addendum)
Will arrange for pulmonary function test  Will arrange for overnight oxygen test  Follow up in 8 weeks

## 2020-12-04 ENCOUNTER — Other Ambulatory Visit: Payer: Self-pay

## 2020-12-04 ENCOUNTER — Ambulatory Visit (INDEPENDENT_AMBULATORY_CARE_PROVIDER_SITE_OTHER): Payer: Medicare Other | Admitting: Adult Health

## 2020-12-04 ENCOUNTER — Encounter: Payer: Self-pay | Admitting: Adult Health

## 2020-12-04 VITALS — BP 117/61 | HR 67 | Ht 64.0 in | Wt 132.0 lb

## 2020-12-04 DIAGNOSIS — B369 Superficial mycosis, unspecified: Secondary | ICD-10-CM

## 2020-12-04 DIAGNOSIS — R739 Hyperglycemia, unspecified: Secondary | ICD-10-CM | POA: Diagnosis not present

## 2020-12-04 LAB — POCT CBG (FASTING - GLUCOSE)-MANUAL ENTRY: Glucose Fasting, POC: 203 mg/dL — AB (ref 70–99)

## 2020-12-04 MED ORDER — FLUCONAZOLE 100 MG PO TABS: ORAL_TABLET | ORAL | 0 refills | Status: DC

## 2020-12-04 NOTE — Progress Notes (Signed)
  Subjective:     Patient ID: Tasha Roberts, female   DOB: Aug 21, 1941, 79 y.o.   MRN: [id number]  HPI Tasha Roberts is a 79 year old, white female, married, PM in complaining of vaginal and groin itching for 3 months, has used OTC meds without relief. She says she has stress in her life, placed husband in nursing home, sold her house and buying another here. She used to have diabetes but lost 50 lbs and says she is good now. PCP is Dr Sherrie Sport.  Review of Systems Has itching in vaginal area and groin for 3 months, has tried OTC meds without relief +stress  Reviewed past medical,surgical, social and family history. Reviewed medications and allergies.     Objective:   Physical Exam BP 117/61 (BP Location: Left Arm, Patient Position: Sitting, Cuff Size: Normal)   Pulse 67   Ht 5\' 4"  (1.626 m)   Wt 132 lb (59.9 kg)   BMI 22.66 kg/m  finger stick blood sugar was 203 Skin warm and dry.Pelvic: external genitalia is swollen and red,and red in groin, vagina: pale and dry,urethra has no lesions or masses noted, cervix:smooth, uterus: normal size, shape and contour, non tender, no masses felt, adnexa: no masses or tenderness noted. Bladder is non tender and no masses felt.  Painted vulva and groin with gentian violet. Examination chaperoned by Celene Squibb LPN.  AA is 0 Fall risk is low PHQ 9 score is 7,no SI, has stress  GAD 7 score is 3   [facility] - 12/04/20 1129      Pregnancy Intention Screening   Does the patient want to become pregnant in the next year? No    Does the patient's partner want to become pregnant in the next year? No    Would the patient like to discuss contraceptive options today? No      Contraception Wrap Up   Current Method --   PM   End Method --   PM   Contraception Counseling Provided No             Assessment:     1. Superficial fungus infection of skin Painted with gentian violet Will rx diflucan  Try to let area get air at night   Meds ordered this  encounter  Medications  . fluconazole (DIFLUCAN) 100 MG tablet    Sig: Take 1 daily for 10 days    Dispense:  10 tablet    Refill:  0    Order Specific Question:   Supervising Provider    Answer:   Elonda Husky, LUTHER H [[id number]]    2. Elevated blood sugar Follow up with Dr Sherrie Sport 12/25/20    Plan:     Follow up with me in 10 days.may need to paint with gentian violet again if not better

## 2020-12-14 ENCOUNTER — Ambulatory Visit: Payer: Medicare Other | Admitting: Adult Health

## 2020-12-18 ENCOUNTER — Telehealth: Payer: Self-pay | Admitting: Pulmonary Disease

## 2020-12-18 NOTE — Telephone Encounter (Signed)
ONO with RA 12/01/20 >> test time 8 hrs 3 min.  Baseline SpO2 94%, low SpO2 85%.  Spent 3 min 53 sec with SpO2 < 88%.   Please let her know her oxygen level looked good.  She does not need to use supplemental oxygen at night.

## 2020-12-19 NOTE — Telephone Encounter (Signed)
Called and spoke with pt letting her know the results of the ONO and she verbalized understanding. Nothing further needed. 

## 2020-12-28 ENCOUNTER — Ambulatory Visit: Payer: Medicare Other | Admitting: Adult Health

## 2021-01-07 ENCOUNTER — Ambulatory Visit: Payer: Medicare Other | Admitting: Adult Health

## 2021-01-11 ENCOUNTER — Ambulatory Visit: Payer: Medicare Other | Admitting: Pulmonary Disease

## 2021-01-24 ENCOUNTER — Other Ambulatory Visit: Payer: Self-pay

## 2021-01-24 ENCOUNTER — Ambulatory Visit (INDEPENDENT_AMBULATORY_CARE_PROVIDER_SITE_OTHER): Payer: Medicare Other | Admitting: Adult Health

## 2021-01-24 ENCOUNTER — Telehealth: Payer: Self-pay | Admitting: Pulmonary Disease

## 2021-01-24 ENCOUNTER — Encounter: Payer: Self-pay | Admitting: Adult Health

## 2021-01-24 VITALS — BP 119/64 | HR 93 | Ht 64.0 in | Wt 134.0 lb

## 2021-01-24 DIAGNOSIS — B369 Superficial mycosis, unspecified: Secondary | ICD-10-CM

## 2021-01-24 DIAGNOSIS — J9611 Chronic respiratory failure with hypoxia: Secondary | ICD-10-CM

## 2021-01-24 DIAGNOSIS — L439 Lichen planus, unspecified: Secondary | ICD-10-CM | POA: Diagnosis not present

## 2021-01-24 DIAGNOSIS — J479 Bronchiectasis, uncomplicated: Secondary | ICD-10-CM

## 2021-01-24 MED ORDER — FLUCONAZOLE 100 MG PO TABS: ORAL_TABLET | ORAL | 0 refills | Status: DC

## 2021-01-24 NOTE — Telephone Encounter (Signed)
Order placed for pt's O2 to be discontinued. Called and spoke with pt letting her know that Dr. Halford Chessman was fine with this being done and she verbalized understanding. Nothing further needed.

## 2021-01-24 NOTE — Telephone Encounter (Signed)
Called and spoke with pt. Pt stated that she would like to have her O2 discontinued as she has not needed to wear it. Pt said she has not worn it since her last OV with VS 11/15/20.  Pt said that she does have a pulse ox that she does use to monitor her O2 sats and she states they have been ranging from 97-98 throughout the day on room air.  Dr. Halford Chessman, please advise if you are okay with [place] placing order for pt's O2 to be discontinued.

## 2021-01-24 NOTE — Progress Notes (Signed)
  Subjective:     Patient ID: Tasha Roberts, female   DOB: 22-Jan-1941, 80 y.o.   MRN: [id number]  HPI Tasha Roberts is a 80 year old white female,married, PM,back in follow up, has had to reschedule several times. She was  seen in December for vaginal and groin itching, and was treated with diflucan and gentian violet and felt better for awhile and it is back, feels chafed and itchy. Has used lotrisone and it helps some with itching. PCP is Dr Sherrie Sport.  Review of Systems Vaginal itching and chafing in groin. Reviewed past medical,surgical, social and family history. Reviewed medications and allergies.     Objective:   Physical Exam BP 119/64 (BP Location: Left Arm, Patient Position: Sitting, Cuff Size: Normal)   Pulse 93   Ht 5\' 4"  (1.626 m)   Wt 134 lb (60.8 kg)   BMI 23.00 kg/m   Skin warm and dry.Pelvic: external genitalia is red and swollen and chafed, on labia and groin vagina: the inner labia have atrophied and have thick white plaques now, and excoriation. Dr Elonda Husky in for co exam. Will paint with gentian violet now and give diflucan for 10 days and bring her back in about 2 weeks for bunch biopsy.   Fall risk is moderate  [facility] - 01/24/21 1203      Pregnancy Intention Screening   Does the patient want to become pregnant in the next year? N/A    Does the patient's partner want to become pregnant in the next year? N/A    Would the patient like to discuss contraceptive options today? N/A      Contraception Wrap Up   Current Method No Method - Other Reason   postmenopausal   End Method No Method - Other Reason   postmenopausal   Contraception Counseling Provided No         Examination chaperoned by Levy Pupa LPN Assessment:     1. Superficial fungus infection of skin Painted with gentian violet Will rx diflucan    Meds ordered this encounter  Medications  . fluconazole (DIFLUCAN) 100 MG tablet    Sig: Take 1 daily for 10 days    Dispense:  10 tablet    Refill:  0    Order  Specific Question:   Supervising Provider    Answer:   Elonda Husky, LUTHER H [[id number]]    2. Lichen planus Return in 2 weeks for punch biopsy with Dr Elonda Husky     Plan:     Explained to her need to rule out cancer at the area that is thickened and white and that this can be chronic

## 2021-01-24 NOTE — Telephone Encounter (Signed)
Okay to send order to have home oxygen set up discontinued and equipment removed.

## 2021-02-04 ENCOUNTER — Ambulatory Visit: Payer: Medicare Other | Admitting: Pulmonary Disease

## 2021-02-11 ENCOUNTER — Ambulatory Visit (INDEPENDENT_AMBULATORY_CARE_PROVIDER_SITE_OTHER): Payer: Medicare Other | Admitting: Obstetrics & Gynecology

## 2021-02-11 ENCOUNTER — Other Ambulatory Visit: Payer: Self-pay | Admitting: Obstetrics & Gynecology

## 2021-02-11 ENCOUNTER — Encounter: Payer: Self-pay | Admitting: Obstetrics & Gynecology

## 2021-02-11 ENCOUNTER — Other Ambulatory Visit: Payer: Self-pay

## 2021-02-11 VITALS — BP 123/60 | HR 62 | Wt 129.0 lb

## 2021-02-11 DIAGNOSIS — L439 Lichen planus, unspecified: Secondary | ICD-10-CM | POA: Diagnosis not present

## 2021-02-11 DIAGNOSIS — N903 Dysplasia of vulva, unspecified: Secondary | ICD-10-CM

## 2021-02-11 NOTE — Progress Notes (Signed)
Patient ID: Tasha Roberts, female   DOB: 06/23/1941, 80 y.o.   MRN: [id number]  Chief Complaint  Patient presents with  . Biopsy    HPI Tasha Roberts is a 80 y.o. female.  Has white plaque areas consistent with lichen planus, evaluate for dysplasia/cancer HPI Indication: patchy and white lesion of the vulva Symptoms:   none  Location:  labia minora bilaterally  Past Medical History:  Diagnosis Date  . Atrial fibrillation ([place])   . CAD (coronary artery disease)   . Cirrhosis ([place])   . Depression with anxiety   . Diabetes mellitus ([place])   . Hyperlipidemia   . Hypertension   . Hypothyroidism   . NASH (nonalcoholic steatohepatitis)   . Sarcoidosis 1965    Past Surgical History:  Procedure Laterality Date  . BREAST SURGERY    . CHOLECYSTECTOMY      Family History  Problem Relation Age of Onset  . Hypertension Mother   . Dementia Mother   . Heart disease Father   . Heart disease Brother     Social History Social History   Tobacco Use  . Smoking status: Never Smoker  . Smokeless tobacco: Never Used  Vaping Use  . Vaping Use: Never used  Substance Use Topics  . Alcohol use: Not Currently  . Drug use: Not Currently    Allergies  Allergen Reactions  . Dicyclomine Other (See Comments)  . Metformin And Related   . Losartan Other (See Comments)    Heartburn     Current Outpatient Medications  Medication Sig Dispense Refill  . apixaban (ELIQUIS) 5 MG TABS tablet Take 1 tablet by mouth 2 (two) times daily.    Marland Kitchen atorvastatin (LIPITOR) 20 MG tablet Take 20 mg by mouth daily.    . Cholecalciferol 50 MCG (2000 UT) CAPS Take 2,000 mg by mouth daily.    . clotrimazole-betamethasone (LOTRISONE) cream Apply topically 2 (two) times daily.    Marland Kitchen diltiazem (CARDIZEM CD) 240 MG 24 hr capsule Take 240 mg by mouth daily.    . fluconazole (DIFLUCAN) 100 MG tablet Take 1 daily for 10 days 10 tablet 0  . levothyroxine (SYNTHROID) 100 MCG tablet Take 150 mcg by mouth daily before  breakfast.    . lisinopril (ZESTRIL) 20 MG tablet Take 20 mg by mouth daily.    . nitroGLYCERIN (NITROSTAT) 0.4 MG SL tablet Place 0.4 mg under the tongue every 5 (five) minutes as needed.      No current facility-administered medications for this visit.    Review of Systems Review of Systems  Blood pressure 123/60, pulse 62, weight 129 lb (58.5 kg).  Physical Exam Physical Exam Stable exam as on 02/03/21 Data Reviewed   Assessment    Prepping with Betadine Local anesthesia with 1% Buffered Lidocaine 4  mm punch biopsy performed per protocol On eliquis,2 large figure of eights sutures placed for hemostasis Well tolerated  Specimen appropriately identified and sent to pathology    Plan    Follow-up:  2 weeks   to go over results and remove suture   Florian Buff 02/11/2021, 12:09 PM

## 2021-02-15 ENCOUNTER — Telehealth: Payer: Self-pay | Admitting: *Deleted

## 2021-02-15 NOTE — Telephone Encounter (Signed)
Called and scheduled the patient for a new patient appt on 4/1; per referring MD note (3 weeks from now).  Patient had procedure/surgery. Patient given address and phone number for the clinic. Patient also given the policy for mask and visitors.

## 2021-02-15 NOTE — Addendum Note (Signed)
Addended by: Octaviano Glow on: 02/15/2021 08:51 AM   Modules accepted: Orders

## 2021-02-19 ENCOUNTER — Telehealth: Payer: Self-pay | Admitting: Obstetrics & Gynecology

## 2021-02-19 NOTE — Telephone Encounter (Signed)
Patient called stating that Dr. Elonda Husky did a biopsy not to long ago and she states that it was good but now she started bleeding and she would like to know what Dr. Elonda Husky would like to do. Please contact pt

## 2021-02-21 ENCOUNTER — Observation Stay (HOSPITAL_COMMUNITY)
Admission: EM | Admit: 2021-02-21 | Discharge: 2021-02-22 | Disposition: A | Discharge status: Home / Self Care | Payer: Medicare Other | Attending: Family Medicine | Admitting: Family Medicine

## 2021-02-21 ENCOUNTER — Encounter (HOSPITAL_COMMUNITY): Payer: Self-pay

## 2021-02-21 ENCOUNTER — Other Ambulatory Visit: Payer: Self-pay

## 2021-02-21 DIAGNOSIS — D869 Sarcoidosis, unspecified: Secondary | ICD-10-CM | POA: Diagnosis not present

## 2021-02-21 DIAGNOSIS — D696 Thrombocytopenia, unspecified: Secondary | ICD-10-CM

## 2021-02-21 DIAGNOSIS — I9589 Other hypotension: Secondary | ICD-10-CM

## 2021-02-21 DIAGNOSIS — D6869 Other thrombophilia: Secondary | ICD-10-CM

## 2021-02-21 DIAGNOSIS — X58XXXA Exposure to other specified factors, initial encounter: Secondary | ICD-10-CM | POA: Insufficient documentation

## 2021-02-21 DIAGNOSIS — I959 Hypotension, unspecified: Secondary | ICD-10-CM | POA: Insufficient documentation

## 2021-02-21 DIAGNOSIS — K7460 Unspecified cirrhosis of liver: Secondary | ICD-10-CM

## 2021-02-21 DIAGNOSIS — I480 Paroxysmal atrial fibrillation: Secondary | ICD-10-CM | POA: Diagnosis not present

## 2021-02-21 DIAGNOSIS — J471 Bronchiectasis with (acute) exacerbation: Secondary | ICD-10-CM

## 2021-02-21 DIAGNOSIS — E119 Type 2 diabetes mellitus without complications: Secondary | ICD-10-CM | POA: Insufficient documentation

## 2021-02-21 DIAGNOSIS — E861 Hypovolemia: Secondary | ICD-10-CM

## 2021-02-21 DIAGNOSIS — N939 Abnormal uterine and vaginal bleeding, unspecified: Secondary | ICD-10-CM

## 2021-02-21 DIAGNOSIS — Z20822 Contact with and (suspected) exposure to covid-19: Secondary | ICD-10-CM | POA: Diagnosis not present

## 2021-02-21 DIAGNOSIS — S3141XA Laceration without foreign body of vagina and vulva, initial encounter: Secondary | ICD-10-CM | POA: Insufficient documentation

## 2021-02-21 DIAGNOSIS — R188 Other ascites: Secondary | ICD-10-CM | POA: Diagnosis present

## 2021-02-21 DIAGNOSIS — Z79899 Other long term (current) drug therapy: Secondary | ICD-10-CM | POA: Insufficient documentation

## 2021-02-21 DIAGNOSIS — N938 Other specified abnormal uterine and vaginal bleeding: Secondary | ICD-10-CM | POA: Diagnosis not present

## 2021-02-21 DIAGNOSIS — Z7901 Long term (current) use of anticoagulants: Secondary | ICD-10-CM | POA: Insufficient documentation

## 2021-02-21 DIAGNOSIS — R233 Spontaneous ecchymoses: Secondary | ICD-10-CM

## 2021-02-21 DIAGNOSIS — D62 Acute posthemorrhagic anemia: Secondary | ICD-10-CM | POA: Diagnosis not present

## 2021-02-21 DIAGNOSIS — K7581 Nonalcoholic steatohepatitis (NASH): Secondary | ICD-10-CM | POA: Diagnosis not present

## 2021-02-21 DIAGNOSIS — E039 Hypothyroidism, unspecified: Secondary | ICD-10-CM | POA: Insufficient documentation

## 2021-02-21 DIAGNOSIS — I2510 Atherosclerotic heart disease of native coronary artery without angina pectoris: Secondary | ICD-10-CM | POA: Insufficient documentation

## 2021-02-21 DIAGNOSIS — L439 Lichen planus, unspecified: Secondary | ICD-10-CM

## 2021-02-21 LAB — CBC
HCT: 26.7 % — ABNORMAL LOW (ref 36.0–46.0)
Hemoglobin: 8.8 g/dL — ABNORMAL LOW (ref 12.0–15.0)
MCH: 30 pg (ref 26.0–34.0)
MCHC: 33 g/dL (ref 30.0–36.0)
MCV: 91.1 fL (ref 80.0–100.0)
Platelets: 91 10*3/uL — ABNORMAL LOW (ref 150–400)
RBC: 2.93 MIL/uL — ABNORMAL LOW (ref 3.87–5.11)
RDW: 13.7 % (ref 11.5–15.5)
WBC: 4.6 10*3/uL (ref 4.0–10.5)
nRBC: 0 % (ref 0.0–0.2)

## 2021-02-21 LAB — CBC WITH DIFFERENTIAL/PLATELET
Abs Immature Granulocytes: 0.01 10*3/uL (ref 0.00–0.07)
Basophils Absolute: 0 10*3/uL (ref 0.0–0.1)
Basophils Relative: 1 %
Eosinophils Absolute: 0.2 10*3/uL (ref 0.0–0.5)
Eosinophils Relative: 3 %
HCT: 31.1 % — ABNORMAL LOW (ref 36.0–46.0)
Hemoglobin: 10.6 g/dL — ABNORMAL LOW (ref 12.0–15.0)
Immature Granulocytes: 0 %
Lymphocytes Relative: 33 %
Lymphs Abs: 1.6 10*3/uL (ref 0.7–4.0)
MCH: 30.6 pg (ref 26.0–34.0)
MCHC: 34.1 g/dL (ref 30.0–36.0)
MCV: 89.9 fL (ref 80.0–100.0)
Monocytes Absolute: 0.6 10*3/uL (ref 0.1–1.0)
Monocytes Relative: 13 %
Neutro Abs: 2.4 10*3/uL (ref 1.7–7.7)
Neutrophils Relative %: 50 %
Platelets: 117 10*3/uL — ABNORMAL LOW (ref 150–400)
RBC: 3.46 MIL/uL — ABNORMAL LOW (ref 3.87–5.11)
RDW: 13.6 % (ref 11.5–15.5)
WBC: 4.8 10*3/uL (ref 4.0–10.5)
nRBC: 0 % (ref 0.0–0.2)

## 2021-02-21 LAB — RETICULOCYTES
Immature Retic Fract: 6.2 % (ref 2.3–15.9)
RBC.: 3.45 MIL/uL — ABNORMAL LOW (ref 3.87–5.11)
Retic Count, Absolute: 28.6 10*3/uL (ref 19.0–186.0)
Retic Ct Pct: 0.8 % (ref 0.4–3.1)

## 2021-02-21 LAB — COMPREHENSIVE METABOLIC PANEL
ALT: 21 U/L (ref 0–44)
AST: 25 U/L (ref 15–41)
Albumin: 3.1 g/dL — ABNORMAL LOW (ref 3.5–5.0)
Alkaline Phosphatase: 61 U/L (ref 38–126)
Anion gap: 8 (ref 5–15)
BUN: 39 mg/dL — ABNORMAL HIGH (ref 8–23)
CO2: 23 mmol/L (ref 22–32)
Calcium: 8.5 mg/dL — ABNORMAL LOW (ref 8.9–10.3)
Chloride: 110 mmol/L (ref 98–111)
Creatinine, Ser: 1.33 mg/dL — ABNORMAL HIGH (ref 0.44–1.00)
GFR, Estimated: 41 mL/min — ABNORMAL LOW (ref >60)
Glucose, Bld: 132 mg/dL — ABNORMAL HIGH (ref 70–99)
Potassium: 3.7 mmol/L (ref 3.5–5.1)
Sodium: 141 mmol/L (ref 135–145)
Total Bilirubin: 0.7 mg/dL (ref 0.3–1.2)
Total Protein: 5.6 g/dL — ABNORMAL LOW (ref 6.5–8.1)

## 2021-02-21 LAB — VITAMIN B12: Vitamin B-12: 397 pg/mL (ref 180–914)

## 2021-02-21 LAB — IRON AND TIBC
Iron: 56 ug/dL (ref 28–170)
Saturation Ratios: 23 % (ref 10.4–31.8)
TIBC: 244 ug/dL — ABNORMAL LOW (ref 250–450)
UIBC: 188 ug/dL

## 2021-02-21 LAB — TYPE AND SCREEN
ABO/RH(D): AB POS
Antibody Screen: NEGATIVE

## 2021-02-21 LAB — FOLATE: Folate: 45.5 ng/mL (ref >5.9)

## 2021-02-21 LAB — RESP PANEL BY RT-PCR (FLU A&B, COVID) ARPGX2
Influenza A by PCR: NEGATIVE
Influenza B by PCR: NEGATIVE
SARS Coronavirus 2 by RT PCR: NEGATIVE

## 2021-02-21 LAB — FERRITIN: Ferritin: 79 ng/mL (ref 11–307)

## 2021-02-21 MED ORDER — ONDANSETRON HCL 4 MG PO TABS: 4.0000 mg | ORAL_TABLET | Freq: Four times a day (QID) | ORAL | Status: DC | PRN

## 2021-02-21 MED ORDER — LIDOCAINE-EPINEPHRINE (PF) 1 %-1:200000 IJ SOLN
30.0000 mL | Freq: Once | INTRAMUSCULAR | Status: AC
  Administered 2021-02-21: 30 mL
  Filled 2021-02-21: qty 30

## 2021-02-21 MED ORDER — TRAZODONE HCL 50 MG PO TABS: 25.0000 mg | ORAL_TABLET | Freq: Every evening | ORAL | Status: DC | PRN

## 2021-02-21 MED ORDER — ACETAMINOPHEN 325 MG PO TABS: 650.0000 mg | ORAL_TABLET | Freq: Four times a day (QID) | ORAL | Status: DC | PRN

## 2021-02-21 MED ORDER — HYDROCODONE-ACETAMINOPHEN 5-325 MG PO TABS: 1.0000 | ORAL_TABLET | ORAL | Status: DC | PRN

## 2021-02-21 MED ORDER — LEVOTHYROXINE SODIUM 75 MCG PO TABS
150.0000 ug | ORAL_TABLET | Freq: Every day | ORAL | Status: DC
  Administered 2021-02-21 – 2021-02-22 (×2): 150 ug via ORAL
  Filled 2021-02-21: qty 3
  Filled 2021-02-21: qty 2

## 2021-02-21 MED ORDER — SODIUM CHLORIDE 0.9 % IV BOLUS
1000.0000 mL | Freq: Once | INTRAVENOUS | Status: AC
  Administered 2021-02-21: 1000 mL via INTRAVENOUS

## 2021-02-21 MED ORDER — ONDANSETRON HCL 4 MG/2ML IJ SOLN: 4.0000 mg | Freq: Four times a day (QID) | INTRAMUSCULAR | Status: DC | PRN

## 2021-02-21 MED ORDER — NITROGLYCERIN 0.4 MG SL SUBL: 0.4000 mg | SUBLINGUAL_TABLET | SUBLINGUAL | Status: DC | PRN

## 2021-02-21 MED ORDER — ACETAMINOPHEN 650 MG RE SUPP: 650.0000 mg | Freq: Four times a day (QID) | RECTAL | Status: DC | PRN

## 2021-02-21 MED ORDER — ATORVASTATIN CALCIUM 20 MG PO TABS
20.0000 mg | ORAL_TABLET | Freq: Every evening | ORAL | Status: DC
  Administered 2021-02-21 (×2): 20 mg via ORAL
  Filled 2021-02-21: qty 1
  Filled 2021-02-21: qty 2

## 2021-02-21 NOTE — H&P (Addendum)
History and Physical  [facility]  Carolan Avedisian HMC:[id number] DOB: 1940-12-16 DOA: 02/21/2021  PCP: Neale Burly, MD  OB/Gyn: [facility] Patient coming from: Home Level of care: Telemetry  I have personally briefly reviewed patient's old medical records in [place]  Chief Complaint: heavy vaginal bleeding  HPI: Tasha Roberts is a 80 y.o. 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  She has since been having small amounts of vaginal bleeding as she remained on her apixaban but last night began having severe heavy vaginal bleeding with clots.  She presented to the ED for further evaluation. She was treated with a laceration repair in the ED with epinephrine and sutures.  She was bolused 1L NS for soft blood pressures.  No blood given.  Hg was 10.5.  Observation was requested for serial Hg testing to be sure she did not require a blood transfusion for further management.  She denies any other symptoms of chest pain, palpitations or shortness of breath.    Review of Systems: Review of Systems  Constitutional: Negative.   HENT: Negative.   Respiratory: Negative.   Cardiovascular: Negative for chest pain and palpitations.  Gastrointestinal: Negative for abdominal pain, blood in stool, constipation, diarrhea, melena, nausea and vomiting.  Genitourinary:       Heavy vaginal bleeding  Musculoskeletal: Negative.   Skin: Negative.   Neurological: Positive for dizziness.  Endo/Heme/Allergies: Bruises/bleeds easily.  Psychiatric/Behavioral: Negative.      Past Medical History:  Diagnosis Date  . Atrial fibrillation ([place])   . CAD (coronary artery disease)   . Cirrhosis ([place])   . Depression with anxiety   . Diabetes mellitus ([place])   . Hyperlipidemia   . Hypertension   . Hypothyroidism   . NASH (nonalcoholic steatohepatitis)   . Sarcoidosis  1965    Past Surgical History:  Procedure Laterality Date  . BREAST SURGERY    . CHOLECYSTECTOMY       reports that she has never smoked. She has never used smokeless tobacco. She reports previous alcohol use. She reports previous drug use.  Allergies  Allergen Reactions  . Dicyclomine Other (See Comments)  . Metformin And Related   . Losartan Other (See Comments)    Heartburn     Family History  Problem Relation Age of Onset  . Hypertension Mother   . Dementia Mother   . Heart disease Father   . Heart disease Brother     Prior to Admission medications   Medication Sig Start Date End Date Taking? Authorizing Provider  apixaban (ELIQUIS) 5 MG TABS tablet Take 1 tablet by mouth 2 (two) times daily. 03/12/20   [provider]  atorvastatin (LIPITOR) 20 MG tablet Take 20 mg by mouth daily. 07/02/20   [provider]  Cholecalciferol 50 MCG (2000 UT) CAPS Take 2,000 mg by mouth daily.    [provider]  clotrimazole-betamethasone (LOTRISONE) cream Apply topically 2 (two) times daily. 07/02/20   [provider]  diltiazem (CARDIZEM CD) 240 MG 24 hr capsule Take 240 mg by mouth daily. 06/27/20   [provider]  fluconazole (DIFLUCAN) 100 MG tablet Take 1 daily for 10 days 01/24/21   Estill Dooms, NP  levothyroxine (SYNTHROID) 100 MCG tablet Take 150 mcg by mouth daily before breakfast.    [provider]  lisinopril (ZESTRIL) 20 MG tablet Take 20 mg by mouth daily.  [provider]  nitroGLYCERIN (NITROSTAT) 0.4 MG SL tablet Place 0.4 mg under the tongue every 5 (five) minutes as needed.  04/18/20   [provider]    Physical Exam: Vitals:   02/21/21 0600 02/21/21 0615 02/21/21 0630 02/21/21 0645  BP: 92/71 (!) 105/35 (!) 98/32 (!) 100/38  Pulse: (!) 53 (!) 55 (!) 48 (!) 49  Resp:      SpO2: 100% 99% 100% 100%  Weight:      Height:        Constitutional: NAD, calm, comfortable Eyes: PERRL, lids  and conjunctivae normal ENMT: Mucous membranes are moist. Posterior pharynx clear of any exudate or lesions.Normal dentition.  Neck: normal, supple, no masses, no thyromegaly Respiratory: clear to auscultation bilaterally, no wheezing, no crackles. Normal respiratory effort. No accessory muscle use.  Cardiovascular: normal s1, s2 sounds, no murmurs / rubs / gallops. No extremity edema. 2+ pedal pulses. No carotid bruits.  Abdomen: no tenderness, no masses palpated. No hepatosplenomegaly. Bowel sounds positive.  Musculoskeletal: no clubbing / cyanosis. No joint deformity upper and lower extremities. Good ROM, no contractures. Normal muscle tone.  GU: deferred.  Skin: no rashes, lesions, ulcers. No induration Neurologic: CN 2-12 grossly intact. Sensation intact, DTR normal. Strength 5/5 in all 4.  Psychiatric: Normal judgment and insight. Alert and oriented x 3. Normal mood.   Labs on Admission: I have personally reviewed following labs and imaging studies  CBC: Recent Labs  Lab 02/21/21 0542  WBC 4.8  NEUTROABS 2.4  HGB 10.6*  HCT 31.1*  MCV 89.9  PLT 741*   Basic Metabolic Panel: Recent Labs  Lab 02/21/21 0542  NA 141  K 3.7  CL 110  CO2 23  GLUCOSE 132*  BUN 39*  CREATININE 1.33*  CALCIUM 8.5*   GFR: Estimated Creatinine Clearance: 30.6 mL/min (A) (by C-G formula based on SCr of 1.33 mg/dL (H)). Liver Function Tests: Recent Labs  Lab 02/21/21 0542  AST 25  ALT 21  ALKPHOS 61  BILITOT 0.7  PROT 5.6*  ALBUMIN 3.1*   No results for input(s): LIPASE, AMYLASE in the last 168 hours. No results for input(s): AMMONIA in the last 168 hours. Coagulation Profile: No results for input(s): INR, PROTIME in the last 168 hours. Cardiac Enzymes: No results for input(s): CKTOTAL, CKMB, CKMBINDEX, TROPONINI in the last 168 hours. BNP (last 3 results) No results for input(s): PROBNP in the last 8760 hours. HbA1C: No results for input(s): HGBA1C in the last 72 hours. CBG: No  results for input(s): GLUCAP in the last 168 hours. Lipid Profile: No results for input(s): CHOL, HDL, LDLCALC, TRIG, CHOLHDL, LDLDIRECT in the last 72 hours. Thyroid Function Tests: No results for input(s): TSH, T4TOTAL, FREET4, T3FREE, THYROIDAB in the last 72 hours. Anemia Panel: No results for input(s): VITAMINB12, FOLATE, FERRITIN, TIBC, IRON, RETICCTPCT in the last 72 hours. Urine analysis: No results found for: COLORURINE, APPEARANCEUR, LABSPEC, PHURINE, GLUCOSEU, HGBUR, BILIRUBINUR, KETONESUR, PROTEINUR, UROBILINOGEN, NITRITE, LEUKOCYTESUR  Radiological Exams on Admission: No results found.  Assessment/Plan Principal Problem:   Acute blood loss anemia Active Problems:   AF (paroxysmal atrial fibrillation) (HCC)   Liver cirrhosis secondary to NASH (HCC)   Vaginal bleeding   Acquired thrombophilia (HCC)   Sarcoidosis   CAD (coronary artery disease)   Hypotension   1. Acute blood loss anemia-secondary to heavy vaginal bleeding from biopsy-fortunately bleeding stopped after epinephrine and sutures done in the emergency department.  Temporarily holding apixaban for now.  Recheck CBC later today  and in AM.  2. Paroxysmal atrial fibrillation-temporarily holding apixaban in the setting of active bleeding.  Hopefully can resume apixaban soon when cleared by OB on outpatient follow-up. 3. CAD-stable no chest pain symptoms plan to resume home medications when reconciled. 4. Hypotension-likely secondary to acute blood loss anemia, bolused 1 L IV fluids follow. 5. Sinus bradycardia - temporarily holding long acting diltiazem, restart at reduced dose, seems like the 240 mg is too much right now.   6. Stage 3b CKD - stable.  Follow.  7. High 12 month mortality risk - flagged in chart, plan to make ambulatory referral to palliative care at discharge.   DVT prophylaxis: SCD Code Status: Full Family Communication: Patient prefers to update Disposition Plan: Home tomorrow Consults called:   Admission status: Observation Level of care: Telemetry Irwin Brakeman MD [facility] How to contact the [facility] Attending or Consulting provider [place] or covering provider during after hours [place], for this patient?  1. Check the care team in [facility] and look for a) attending/consulting [facility] provider listed and b) the [facility] team listed 2. Log into [email or web address] and use [place]'s universal password to access. If you do not have the password, please contact the hospital operator. 3. Locate the [facility] provider you are looking for under [facility] and page to a number that you can be directly reached. 4. If you still have difficulty reaching the provider, please page the [facility] (Director on Call) for the Hospitalists listed on amion for assistance.   If 7PM-7AM, please contact night-coverage [email or web address] Password [facility]  02/21/2021, 7:41 AM

## 2021-02-21 NOTE — Plan of Care (Signed)

## 2021-02-21 NOTE — ED Notes (Signed)
Provider at bedside

## 2021-02-21 NOTE — Evaluation (Signed)
Physical Therapy Evaluation Patient Details Name: Shirla Hodgkiss MRN: [id number] DOB: May 06, 1941 Today's Date: 02/21/2021   History of Present Illness  Falecia Vannatter is a 80 y.o. 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  She has since been having small amounts of vaginal bleeding as she remained on her apixaban but last night began having severe heavy vaginal bleeding with clots.  She presented to the ED for further evaluation. She was treated with a laceration repair in the ED with epinephrine and sutures.  She was bolused 1L NS for soft blood pressures.  No blood given.  Hg was 10.5.  Observation was requested for serial Hg testing to be sure she did not require a blood transfusion for further management.  She denies any other symptoms of chest pain, palpitations or shortness of breath.    Clinical Impression  Patient functioning near baseline for functional mobility and gait demonstrating good return for transferring to/from commode in bathroom, able to ambulate without AD without loss of balance, limited mostly due to fatigue and put back to bed after therapy due c/o increasing groin discomfort when sitting.  Patient will benefit from continued physical therapy in hospital and recommended venue below to increase strength, balance, endurance for safe ADLs and gait.     Follow Up Recommendations No PT follow up;Supervision for mobility/OOB;Supervision - Intermittent    Equipment Recommendations  None recommended by PT    Recommendations for Other Services       Precautions / Restrictions Precautions Precautions: None Restrictions Weight Bearing Restrictions: No      Mobility  Bed Mobility Overal bed mobility: Modified Independent                  Transfers Overall transfer level: Needs assistance Equipment used: None Transfers: Sit  to/from Stand;Stand Pivot Transfers Sit to Stand: Modified independent (Device/Increase time);Supervision Stand pivot transfers: Modified independent (Device/Increase time);Supervision       General transfer comment: slightly labored movement, good return for transferring to/from commode in bathroom  Ambulation/Gait Ambulation/Gait assistance: Supervision Gait Distance (Feet): 65 Feet Assistive device: None Gait Pattern/deviations: Decreased step length - right;Decreased step length - left;Decreased stride length Gait velocity: decreased   General Gait Details: slightly labored cadence without loss of balance, limited mostly due to fatigue  Stairs            Wheelchair Mobility    Modified Rankin (Stroke Patients Only)       Balance Overall balance assessment: Mild deficits observed, not formally tested                                           Pertinent Vitals/Pain Pain Assessment: Faces Pain Score: 2  Pain Location: groin area at site of bleeding Pain Descriptors / Indicators: Sore Pain Intervention(s): Limited activity within patient's tolerance;Monitored during session    Home Living Family/patient expects to be discharged to:: Private residence Living Arrangements: Children Available Help at Discharge: Family;Available 24 hours/day Type of Home: House Home Access: Stairs to enter Entrance Stairs-Rails: Right;Left;Can reach both Entrance Stairs-Number of Steps: 4 Home Layout: One level Home Equipment: Walker - 2 wheels;Walker - 4 wheels;Cane - single point;Wheelchair - manual      Prior Function Level of Independence: Independent with assistive device(s)  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[employer]     Hand Dominance        Extremity/Trunk Assessment   Upper Extremity Assessment Upper Extremity Assessment: Overall WFL for tasks assessed    Lower Extremity Assessment Lower  Extremity Assessment: Generalized weakness    Cervical / Trunk Assessment Cervical / Trunk Assessment: Normal  Communication   Communication: No difficulties  Cognition Arousal/Alertness: Awake/alert Behavior During Therapy: WFL for tasks assessed/performed Overall Cognitive Status: Within Functional Limits for tasks assessed                                        General Comments      Exercises     Assessment/Plan    PT Assessment Patient needs continued PT services  PT Problem List Decreased strength;Decreased activity tolerance;Decreased balance;Decreased mobility       PT Treatment Interventions Gait training;Stair training;Functional mobility training;Therapeutic activities;Therapeutic exercise;DME instruction;Balance training;Patient/family education    PT Goals (Current goals can be found in the Care Plan section)  Acute Rehab PT Goals Patient Stated Goal: return home with family to assist PT Goal Formulation: With patient Time For Goal Achievement: 02/25/21 Potential to Achieve Goals: Good    Frequency Min 3X/week   Barriers to discharge        Co-evaluation               AM-PAC PT "6 Clicks" Mobility  Outcome Measure Help needed turning from your back to your side while in a flat bed without using bedrails?: None Help needed moving from lying on your back to sitting on the side of a flat bed without using bedrails?: None Help needed moving to and from a bed to a chair (including a wheelchair)?: None Help needed standing up from a chair using your arms (e.g., wheelchair or bedside chair)?: None Help needed to walk in hospital room?: A Little Help needed climbing 3-5 steps with a railing? : A Little 6 Click Score: 22    End of Session   Activity Tolerance: Patient tolerated treatment well;Patient limited by fatigue Patient left: in bed;with call bell/phone within reach Nurse Communication: Mobility status PT Visit Diagnosis:  Unsteadiness on feet (R26.81);Other abnormalities of gait and mobility (R26.89);Muscle weakness (generalized) (M62.81)    Time: 1422-1450 PT Time Calculation (min) (ACUTE ONLY): 28 min   Charges:   PT Evaluation $PT Eval Moderate Complexity: 1 Mod PT Treatments $Therapeutic Activity: 23-37 mins        3:29 PM, 02/21/21 Lonell Grandchild, MPT Physical Therapist with [facility] 336 [phone] office [phone] mobile phone

## 2021-02-21 NOTE — Plan of Care (Signed)
  Problem: Acute Rehab PT Goals(only PT should resolve) Goal: Pt Will Go Supine/Side To Sit Outcome: Progressing Flowsheets (Taken 02/21/2021 1533) Pt will go Supine/Side to Sit:  Independently  with modified independence Goal: Patient Will Transfer Sit To/From Stand Outcome: Progressing Flowsheets (Taken 02/21/2021 1533) Patient will transfer sit to/from stand:  Independently  with modified independence Goal: Pt Will Transfer Bed To Chair/Chair To Bed Outcome: Progressing Flowsheets (Taken 02/21/2021 1533) Pt will Transfer Bed to Chair/Chair to Bed:  Independently  with modified independence Goal: Pt Will Ambulate Outcome: Progressing Flowsheets (Taken 02/21/2021 1533) Pt will Ambulate:  100 feet  with modified independence  with least restrictive assistive device   Problem: Acute Rehab PT Goals(only PT should resolve) Goal: Pt Will Go Supine/Side To Sit Outcome: Progressing Goal: Patient Will Transfer Sit To/From Stand Outcome: Progressing Goal: Pt Will Transfer Bed To Chair/Chair To Bed Outcome: Progressing Goal: Pt Will Ambulate Outcome: Progressing   3:33 PM, 02/21/21 Lonell Grandchild, MPT Physical Therapist with [facility] [phone] office [phone] mobile phone

## 2021-02-21 NOTE — ED Notes (Signed)
Assisted pt to bathroom changed bedding it was bloody and wet. Warm blankets given

## 2021-02-21 NOTE — ED Triage Notes (Signed)
BIB EMS c/o vaginal bleeding since biopsy on 3/7, on eliquis.

## 2021-02-21 NOTE — ED Provider Notes (Signed)
[facility] Provider Note   CSN: [id number] Arrival date & time: 02/21/21  0524     History Chief Complaint  Patient presents with  . Vaginal Bleeding    Tasha Roberts is a 80 y.o. female.  Patient presents to the emergency department for evaluation of bleeding.  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  She has a history of atrial fibrillation and takes Eliquis.  She has been taking the Eliquis as prescribed since the procedure.  She reports that she has been having a lot of spotting from the surgical bed since the procedure but tonight started having heavy bleeding and clots.        Past Medical History:  Diagnosis Date  . Atrial fibrillation ([place])   . CAD (coronary artery disease)   . Cirrhosis ([place])   . Depression with anxiety   . Diabetes mellitus ([place])   . Hyperlipidemia   . Hypertension   . Hypothyroidism   . NASH (nonalcoholic steatohepatitis)   . Sarcoidosis 1965    Patient Active Problem List   Diagnosis Date Noted  . Lichen planus 56/81/2751  . Elevated blood sugar 12/04/2020  . Superficial fungus infection of skin 12/04/2020  . RSV (respiratory syncytial virus infection) 10/03/2020  . Acute respiratory failure with hypoxia ([place]) 10/02/2020  . Bronchiectasis with (acute) exacerbation ([place]) 10/02/2020  . AF (paroxysmal atrial fibrillation) ([place]) 10/02/2020  . Liver cirrhosis secondary to NASH ([place]) 10/02/2020  . Respiratory failure with hypoxia ([place]) 10/01/2020    Past Surgical History:  Procedure Laterality Date  . BREAST SURGERY    . CHOLECYSTECTOMY       OB History    Gravida  3   Para  3   Term  3   Preterm      AB      Living  3     SAB      IAB      Ectopic      Multiple      Live Births              Family History  Problem Relation Age of Onset  . Hypertension Mother   . Dementia Mother   . Heart disease Father   . Heart disease Brother     Social History   Tobacco  Use  . Smoking status: Never Smoker  . Smokeless tobacco: Never Used  Vaping Use  . Vaping Use: Never used  Substance Use Topics  . Alcohol use: Not Currently  . Drug use: Not Currently    Home Medications Prior to Admission medications   Medication Sig Start Date End Date Taking? Authorizing Provider  apixaban (ELIQUIS) 5 MG TABS tablet Take 1 tablet by mouth 2 (two) times daily. 03/12/20   [provider]  atorvastatin (LIPITOR) 20 MG tablet Take 20 mg by mouth daily. 07/02/20   [provider]  Cholecalciferol 50 MCG (2000 UT) CAPS Take 2,000 mg by mouth daily.    [provider]  clotrimazole-betamethasone (LOTRISONE) cream Apply topically 2 (two) times daily. 07/02/20   [provider]  diltiazem (CARDIZEM CD) 240 MG 24 hr capsule Take 240 mg by mouth daily. 06/27/20   [provider]  fluconazole (DIFLUCAN) 100 MG tablet Take 1 daily for 10 days 01/24/21   Estill Dooms, NP  levothyroxine (SYNTHROID) 100 MCG tablet Take 150 mcg by mouth daily before breakfast.    [provider]  lisinopril (ZESTRIL)  20 MG tablet Take 20 mg by mouth daily.    [provider]  nitroGLYCERIN (NITROSTAT) 0.4 MG SL tablet Place 0.4 mg under the tongue every 5 (five) minutes as needed.  04/18/20   [provider]    Allergies    Dicyclomine, Metformin and related, and Losartan  Review of Systems   Review of Systems  Skin: Positive for wound.  Neurological: Positive for dizziness.  All other systems reviewed and are negative.   Physical Exam Updated Vital Signs BP 92/71   Pulse (!) 53   Resp 18   Ht 5\' 4"  (1.626 m)   Wt 58.5 kg   SpO2 100%   BMI 22.14 kg/m   Physical Exam Vitals and nursing note reviewed. Exam conducted with a chaperone present.  Constitutional:      General: She is not in acute distress.    Appearance: Normal appearance. She is well-developed.  HENT:     Head: Normocephalic and atraumatic.      Right Ear: Hearing normal.     Left Ear: Hearing normal.     Nose: Nose normal.  Eyes:     Conjunctiva/sclera: Conjunctivae normal.     Pupils: Pupils are equal, round, and reactive to light.  Cardiovascular:     Rate and Rhythm: Regular rhythm.     Heart sounds: S1 normal and S2 normal. No murmur heard. No friction rub. No gallop.   Pulmonary:     Effort: Pulmonary effort is normal. No respiratory distress.     Breath sounds: Normal breath sounds.  Chest:     Chest wall: No tenderness.  Abdominal:     General: Bowel sounds are normal.     Palpations: Abdomen is soft.     Tenderness: There is no abdominal tenderness. There is no guarding or rebound. Negative signs include Murphy's sign and McBurney's sign.     Hernia: No hernia is present.  Genitourinary:   Musculoskeletal:        General: Normal range of motion.     Cervical back: Normal range of motion and neck supple.  Skin:    General: Skin is warm and dry.     Findings: Wound present.  Neurological:     Mental Status: She is alert and oriented to person, place, and time.     GCS: GCS eye subscore is 4. GCS verbal subscore is 5. GCS motor subscore is 6.     Cranial Nerves: No cranial nerve deficit.     Sensory: No sensory deficit.     Coordination: Coordination normal.  Psychiatric:        Speech: Speech normal.        Behavior: Behavior normal.        Thought Content: Thought content normal.     ED Results / Procedures / Treatments   Labs (all labs ordered are listed, but only abnormal results are displayed) Labs Reviewed  COMPREHENSIVE METABOLIC PANEL - Abnormal; Notable for the following components:      Result Value   Glucose, Bld 132 (*)    BUN 39 (*)    Creatinine, Ser 1.33 (*)    Calcium 8.5 (*)    Total Protein 5.6 (*)    Albumin 3.1 (*)    GFR, Estimated 41 (*)    All other components within normal limits  RESP PANEL BY RT-PCR (FLU A&B, COVID) ARPGX2  CBC WITH DIFFERENTIAL/PLATELET  TYPE AND  SCREEN    EKG None  Radiology No results  found.  Procedures .Marland KitchenLaceration Repair  Date/Time: 02/21/2021 6:06 AM Performed by: Orpah Greek, MD Authorized by: Orpah Greek, MD   Consent:    Consent obtained:  Verbal   Consent given by:  Patient   Risks, benefits, and alternatives were discussed: yes   Universal protocol:    Procedure explained and questions answered to patient or proxy's satisfaction: yes     Site/side marked: yes     Immediately prior to procedure, a time out was called: yes     Patient identity confirmed:  Verbally with patient Anesthesia:    Anesthesia method:  Local infiltration   Local anesthetic:  Lidocaine 1% WITH epi Laceration details:    Location:  Anogenital   Anogenital location:  Vagina   Length (cm):  1.5 Pre-procedure details:    Preparation:  Patient was prepped and draped in usual sterile fashion Exploration:    Hemostasis achieved with:  Epinephrine Treatment:    Area cleansed with:  Chlorhexidine   Debridement:  None Skin repair:    Repair method:  Sutures   Suture size:  4-0   Suture material:  Prolene   Suture technique:  Simple interrupted   Number of sutures:  3 Approximation:    Approximation:  Close Repair type:    Repair type:  Simple Comments:     Original sutures were removed.  3 simple interrupted sutures were placed over the surgical bed, taking very wide bites to provide pressure over the oozing wound.     Medications Ordered in ED Medications  lidocaine-EPINEPHrine (XYLOCAINE-EPINEPHrine) 1 %-1:200000 (PF) injection 30 mL (30 mLs Other Given 02/21/21 0551)  sodium chloride 0.9 % bolus 1,000 mL (1,000 mLs Intravenous New Bag/Given 02/21/21 0550)    ED Course  I have reviewed the triage vital signs and the nursing notes.  Pertinent labs & imaging results that were available during my care of the patient were reviewed by me and considered in my medical decision making (see chart for  details).    MDM Rules/Calculators/A&P                           Patient presents for evaluation of significant amount of bleeding from a previous lichen planus removal site.  Patient's bleeding secondary to Eliquis.  She took her last dose yesterday morning, did not take the evening dose.  Hemostasis has been achieved with combination of epinephrine and resuturing the wound.  Patient hypotensive at arrival, treated with IV fluids.  Hemoglobin is 10.5.  I suspect that this will equilibrate lower after fluids.  She also has a history of cirrhosis and has mild thrombocytopenia.  She will therefore require observation for serial H&H to determine if she ends up requiring blood transfusion.  Final Clinical Impression(s) / ED Diagnoses Final diagnoses:  Skin hemorrhage    Rx / DC Orders ED Discharge Orders    None       Daison Braxton, Gwenyth Allegra, MD 02/21/21 [phone]

## 2021-02-22 ENCOUNTER — Encounter (HOSPITAL_COMMUNITY): Payer: Self-pay | Admitting: Internal Medicine

## 2021-02-22 DIAGNOSIS — I9589 Other hypotension: Secondary | ICD-10-CM | POA: Diagnosis not present

## 2021-02-22 DIAGNOSIS — I480 Paroxysmal atrial fibrillation: Secondary | ICD-10-CM | POA: Diagnosis not present

## 2021-02-22 DIAGNOSIS — D62 Acute posthemorrhagic anemia: Secondary | ICD-10-CM | POA: Diagnosis not present

## 2021-02-22 DIAGNOSIS — D869 Sarcoidosis, unspecified: Secondary | ICD-10-CM

## 2021-02-22 DIAGNOSIS — N938 Other specified abnormal uterine and vaginal bleeding: Secondary | ICD-10-CM | POA: Diagnosis not present

## 2021-02-22 DIAGNOSIS — D6869 Other thrombophilia: Secondary | ICD-10-CM | POA: Diagnosis not present

## 2021-02-22 LAB — CBC WITH DIFFERENTIAL/PLATELET
Abs Immature Granulocytes: 0.01 10*3/uL (ref 0.00–0.07)
Basophils Absolute: 0 10*3/uL (ref 0.0–0.1)
Basophils Relative: 0 %
Eosinophils Absolute: 0.1 10*3/uL (ref 0.0–0.5)
Eosinophils Relative: 3 %
HCT: 24.2 % — ABNORMAL LOW (ref 36.0–46.0)
Hemoglobin: 8.2 g/dL — ABNORMAL LOW (ref 12.0–15.0)
Immature Granulocytes: 0 %
Lymphocytes Relative: 35 %
Lymphs Abs: 1.4 10*3/uL (ref 0.7–4.0)
MCH: 30.5 pg (ref 26.0–34.0)
MCHC: 33.9 g/dL (ref 30.0–36.0)
MCV: 90 fL (ref 80.0–100.0)
Monocytes Absolute: 0.5 10*3/uL (ref 0.1–1.0)
Monocytes Relative: 13 %
Neutro Abs: 1.9 10*3/uL (ref 1.7–7.7)
Neutrophils Relative %: 49 %
Platelets: 86 10*3/uL — ABNORMAL LOW (ref 150–400)
RBC: 2.69 MIL/uL — ABNORMAL LOW (ref 3.87–5.11)
RDW: 13.4 % (ref 11.5–15.5)
WBC: 3.8 10*3/uL — ABNORMAL LOW (ref 4.0–10.5)
nRBC: 0 % (ref 0.0–0.2)

## 2021-02-22 LAB — BASIC METABOLIC PANEL
Anion gap: 8 (ref 5–15)
BUN: 29 mg/dL — ABNORMAL HIGH (ref 8–23)
CO2: 24 mmol/L (ref 22–32)
Calcium: 8.3 mg/dL — ABNORMAL LOW (ref 8.9–10.3)
Chloride: 110 mmol/L (ref 98–111)
Creatinine, Ser: 0.91 mg/dL (ref 0.44–1.00)
GFR, Estimated: >60 mL/min (ref >60)
Glucose, Bld: 89 mg/dL (ref 70–99)
Potassium: 3.8 mmol/L (ref 3.5–5.1)
Sodium: 142 mmol/L (ref 135–145)

## 2021-02-22 MED ORDER — DILTIAZEM HCL ER COATED BEADS 120 MG PO CP24: 120.0000 mg | ORAL_CAPSULE | Freq: Every day | ORAL | 1 refills | Status: DC

## 2021-02-22 NOTE — Discharge Instructions (Signed)
Please hold apixaban until your surgeon says it is ok to restart. Please follow up with GI doctor about having black stools.  Thrombocytopenia Thrombocytopenia means that you have a low number of platelets in your blood. Platelets are tiny cells in the blood. When you bleed, they clump together at the cut or injury to stop the bleeding. This is called blood clotting. If you do not have enough platelets, it can cause bleeding problems. Some cases of this condition are mild while others are more severe. What are the causes? This condition may be caused by:  Your body not making enough platelets. This may be caused by: ? Your bone marrow not making blood cells (aplastic anemia). ? Cancer in the bone marrow. ? Certain medicines. ? Infection in the bone marrow. ? Drinking a lot of alcohol.  Your body destroying platelets too quickly. This may be caused by: ? Certain immune diseases. ? Certain medicines. ? Certain blood clotting disorders. ? Certain disorders that are passed from parent to child (inherited). ? Certain bleeding disorders. ? Pregnancy. ? Having a spleen that is larger than normal. What are the signs or symptoms?  Bleeding that is not normal.  Nosebleeds.  Heavy menstrual periods.  Blood in the pee (urine) or poop (stool).  A purple-like color to the skin (purpura).  Bruising.  A rash that looks like pinpoint, purple-red spots (petechiae). How is this treated?  Treatment of another condition that is causing the low platelet count.  Medicines to help protect your platelets from being destroyed.  A replacement (transfusion) of platelets to stop or prevent bleeding.  Surgery to remove the spleen. Follow these instructions at home: Activity  Avoid activities that could cause you to get hurt or bruised. Follow instructions about how to prevent falls.  Take care not to cut yourself: ? When you shave. ? When you use scissors, needles, knives, or other  tools.  Take care not to burn yourself: ? When you use an iron. ? When you cook. General instructions  Check your skin and the inside of your mouth for bruises or blood as told by your doctor.  Check to see if there is blood in your spit (sputum), pee, and poop. Do this as told by your doctor.  Do not drink alcohol.  Take over-the-counter and prescription medicines only as told by your doctor.  Do not take any medicines that have aspirin or NSAIDs in them. These medicines can thin your blood and cause you to bleed.  Tell all of your doctors that you have this condition. Be sure to tell your dentist and eye doctor too.   Contact a doctor if:  You have bruises and you do not know why. Get help right away if:  You are bleeding anywhere on your body.  You have blood in your spit, pee, or poop. Summary  Thrombocytopenia means that you have a low number of platelets in your blood.  Platelets are needed for blood clotting.  Symptoms of this condition include bleeding that is not normal, and bruising.  Take care not to cut or burn yourself. This information is not intended to replace advice given to you by your health care provider. Make sure you discuss any questions you have with your health care provider. Document Revised: 08/26/2018 Document Reviewed: 08/26/2018 Elsevier Patient Education  2021 [employer].    IMPORTANT INFORMATION: PAY CLOSE ATTENTION   PHYSICIAN DISCHARGE INSTRUCTIONS  Follow with Primary care provider  Neale Burly, MD  and other consultants as instructed by your Hospitalist Physician  Joseph IF SYMPTOMS COME BACK, WORSEN OR NEW PROBLEM DEVELOPS   Please note: You were cared for by a hospitalist during your hospital stay. Every effort will be made to forward records to your primary care provider.  You can request that your primary care provider send for your hospital records if they have not received them.   Once you are discharged, your primary care physician will handle any further medical issues. Please note that NO REFILLS for any discharge medications will be authorized once you are discharged, as it is imperative that you return to your primary care physician (or establish a relationship with a primary care physician if you do not have one) for your post hospital discharge needs so that they can reassess your need for medications and monitor your lab values.  Please get a complete blood count and chemistry panel checked by your Primary MD at your next visit, and again as instructed by your Primary MD.  Get Medicines reviewed and adjusted: Please take all your medications with you for your next visit with your Primary MD  Laboratory/radiological data: Please request your Primary MD to go over all hospital tests and procedure/radiological results at the follow up, please ask your primary care provider to get all Hospital records sent to his/her office.  In some cases, they will be blood work, cultures and biopsy results pending at the time of your discharge. Please request that your primary care provider follow up on these results.  If you are diabetic, please bring your blood sugar readings with you to your follow up appointment with primary care.    Please call and make your follow up appointments as soon as possible.    Also Note the following: If you experience worsening of your admission symptoms, develop shortness of breath, life threatening emergency, suicidal or homicidal thoughts you must seek medical attention immediately by calling 911 or calling your MD immediately  if symptoms less severe.  You must read complete instructions/literature along with all the possible adverse reactions/side effects for all the Medicines you take and that have been prescribed to you. Take any new Medicines after you have completely understood and accpet all the possible adverse reactions/side effects.    Do not drive when taking Pain medications or sleeping medications (Benzodiazepines)  Do not take more than prescribed Pain, Sleep and Anxiety Medications. It is not advisable to combine anxiety,sleep and pain medications without talking with your primary care practitioner  Special Instructions: If you have smoked or chewed Tobacco  in the last 2 yrs please stop smoking, stop any regular Alcohol  and or any Recreational drug use.  Wear Seat belts while driving.  Do not drive if taking any narcotic, mind altering or controlled substances or recreational drugs or alcohol.

## 2021-02-22 NOTE — Care Management Obs Status (Signed)
[facility] NOTIFICATION   Patient Details  Name: [name] MRN: [id number] Date of Birth: 1941-05-15   Medicare Observation Status Notification Given:  Yes    Tommy Medal 02/22/2021, 11:09 AM

## 2021-02-22 NOTE — Discharge Summary (Signed)
Physician Discharge Summary  Tasha Roberts ZDG:[id number] DOB: [birth year] DOA: 02/21/2021  PCP: Neale Burly, MD OB/GYN: Julieta Bellini  Admit date: 02/21/2021 Discharge date: 02/22/2021  Admitted From:  Home  Disposition:  Home   Recommendations for Outpatient Follow-up:  1. Follow up with OB/GYN in 1 weeks 2. Follow up with Dr. Denman George on 4/1 as scheduled 3. Establish care with hematologist for thrombocytopenia  4. Establish care with [facility] palliative for advanced care planning 5. Please follow up with your GI as soon as possible regarding black stool  Discharge Condition: STABLE   CODE STATUS: FULL DIET: resume prior home diet   Brief Hospitalization Summary: Please see all hospital notes, images, labs for full details of the hospitalization. ADMISSION HPI: Tasha Roberts is a 80 y.o. 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  She has since been having small amounts of vaginal bleeding as she remained on her apixaban but last night began having severe heavy vaginal bleeding with clots.  She presented to the ED for further evaluation. She was treated with a laceration repair in the ED with epinephrine and sutures.  She was bolused 1L NS for soft blood pressures.  No blood given.  Hg was 10.5.  Observation was requested for serial Hg testing to be sure she did not require a blood transfusion for further management.  She denies any other symptoms of chest pain, palpitations or shortness of breath.    Hospital Course   The patient was admitted for observation and monitoring of CBC given that she had a significant vaginal bleed that required laceration repair with epinephrine and sutures.  She also received 1 L IV fluid bolus.  Hemoglobin post fluids was down to 8.8 but since has remained stable and no further vaginal bleeding.  Of note, patient told  me that she has had intermittent black stools even before she was on full anticoagulation.  She has her own private GI and I have strongly advised her to speak with her GI doctor as soon as possible to be evaluated for upper endoscopy.  The patient was also noted to have thrombocytopenia with a platelet count as low as 88.  She reports no prior history of this.  I have made a referral for ambulatory hematology appointment for evaluation of thrombocytopenia with Dr. Delton Coombes.  The patient was agreeable to following up with him.  At this time, holding apixaban until she can follow-up with her surgeons and GI specialist to further work-up her anemia and suspected upper GI bleeding.  Pt verbalized understanding.  Pt has had soft BPs and HR has been soft. I have reduced her long acting diltiazem to 120 mg daily for safety.  Also, patient has high 12 month mortality risk score, I have made an ambulatory referral to [facility] for advanced care planning.    Discharge Diagnoses:  Principal Problem:   Acute blood loss anemia Active Problems:   AF (paroxysmal atrial fibrillation) (HCC)   Liver cirrhosis secondary to NASH [facility])   Vaginal bleeding   Acquired thrombophilia (HCC)   Sarcoidosis   CAD (coronary artery disease)   Hypotension   Discharge Instructions: Discharge Instructions    Amb Referral to Palliative Care   Complete by: As directed    Ambulatory referral to Hematology   Complete by: As directed      Allergies as of 02/22/2021      Reactions  Dicyclomine Other (See Comments)   Metformin And Related    Losartan Other (See Comments)   Heartburn      Medication List    STOP taking these medications   apixaban 5 MG Tabs tablet Commonly known as: ELIQUIS   lisinopril 20 MG tablet Commonly known as: ZESTRIL     TAKE these medications   atorvastatin 20 MG tablet Commonly known as: LIPITOR Take 20 mg by mouth daily.   Cholecalciferol 50 MCG (2000 UT) Caps Take  2,000 mg by mouth daily.   diltiazem 120 MG 24 hr capsule Commonly known as: CARDIZEM CD Take 1 capsule (120 mg total) by mouth daily. Start taking on: February 23, 2021 What changed:   medication strength  how much to take   levothyroxine 100 MCG tablet Commonly known as: SYNTHROID Take 150 mcg by mouth daily before breakfast.   nitroGLYCERIN 0.4 MG SL tablet Commonly known as: NITROSTAT Place 0.4 mg under the tongue every 5 (five) minutes as needed for chest pain.       Follow-up Information    Neale Burly, MD. Schedule an appointment as soon as possible for a visit in 2 week(s).   Specialty: Internal Medicine Contact information: [address] [place] [address] [phone]        Everitt Amber, MD. Go on 03/08/2021.   Specialty: Gynecologic Oncology Why: As Scheduled  Contact information: [place] [address] [phone]        Florian Buff, MD. Schedule an appointment as soon as possible for a visit in 1 week(s).   Specialties: Obstetrics and Gynecology, Radiology Contact information: [place] [address] [phone]        Derek Jack, MD. Schedule an appointment as soon as possible for a visit on 03/04/2021.   Specialty: Hematology Why: Establish care for low platelet count  Arrive at 1:45. Only one support person may come with you. Hemotology appt.  Contact information: [place] [address] [phone]              Allergies  Allergen Reactions  . Dicyclomine Other (See Comments)  . Metformin And Related   . Losartan Other (See Comments)    Heartburn    Allergies as of 02/22/2021      Reactions   Dicyclomine Other (See Comments)   Metformin And Related    Losartan Other (See Comments)   Heartburn      Medication List    STOP taking these medications   apixaban 5 MG Tabs tablet Commonly known as: ELIQUIS   lisinopril 20 MG tablet Commonly known as:  ZESTRIL     TAKE these medications   atorvastatin 20 MG tablet Commonly known as: LIPITOR Take 20 mg by mouth daily.   Cholecalciferol 50 MCG (2000 UT) Caps Take 2,000 mg by mouth daily.   diltiazem 120 MG 24 hr capsule Commonly known as: CARDIZEM CD Take 1 capsule (120 mg total) by mouth daily. Start taking on: February 23, 2021 What changed:   medication strength  how much to take   levothyroxine 100 MCG tablet Commonly known as: SYNTHROID Take 150 mcg by mouth daily before breakfast.   nitroGLYCERIN 0.4 MG SL tablet Commonly known as: NITROSTAT Place 0.4 mg under the tongue every 5 (five) minutes as needed for chest pain.      Procedures/Studies:  No results found.   Subjective: Pt reports no further vaginal bleeding.  She has follow up with Dr. Denman George on 4/1.   Discharge Exam: Vitals:   02/21/21 2014 02/22/21 0519  BP: (!) 111/41 (!) 101/46  Pulse: 65 60  Resp: 20 18  Temp: 98.5 F (36.9 C) 98.2 F (36.8 C)  SpO2: 92% 100%   Vitals:   02/21/21 1008 02/21/21 1354 02/21/21 2014 02/22/21 0519  BP: (!) 98/33 (!) 95/28 (!) 111/41 (!) 101/46  Pulse: (!) 55 (!) 56 65 60  Resp: 20 16 20 18   Temp: (!) 97.5 F (36.4 C) 98.4 F (36.9 C) 98.5 F (36.9 C) 98.2 F (36.8 C)  TempSrc: Oral Oral    SpO2: 100% 100% 92% 100%  Weight:      Height:       General: Pt is alert, awake, not in acute distress Cardiovascular: normal S1/S2 +, no rubs, no gallops Respiratory: CTA bilaterally, no wheezing, no rhonchi Abdominal: Soft, NT, ND, bowel sounds + Extremities: no edema, no cyanosis GU: deferred.     The results of significant diagnostics from this hospitalization (including imaging, microbiology, ancillary and laboratory) are listed below for reference.     Microbiology: Recent Results (from the past 240 hour(s))  Resp Panel by RT-PCR (Flu A&B, Covid) Nasopharyngeal Swab     Status: None   Collection Time: 02/21/21  6:24 AM   Specimen: Nasopharyngeal Swab;  Nasopharyngeal(NP) swabs in vial transport medium  Result Value Ref Range Status   SARS Coronavirus 2 by RT PCR NEGATIVE NEGATIVE Final    Comment: (NOTE) SARS-CoV-2 target nucleic acids are NOT DETECTED.  The SARS-CoV-2 RNA is generally detectable in upper respiratory specimens during the acute phase of infection. The lowest concentration of SARS-CoV-2 viral copies this assay can detect is 138 copies/mL. A negative result does not preclude SARS-Cov-2 infection and should not be used as the sole basis for treatment or other patient management decisions. A negative result may occur with  improper specimen collection/handling, submission of specimen other than nasopharyngeal swab, presence of viral mutation(s) within the areas targeted by this assay, and inadequate number of viral copies(<138 copies/mL). A negative result must be combined with clinical observations, patient history, and epidemiological information. The expected result is Negative.  Fact Sheet for Patients:  EntrepreneurPulse.com.au  Fact Sheet for Healthcare Providers:  IncredibleEmployment.be  This test is no t yet approved or cleared by the [place] FDA and  has been authorized for detection and/or diagnosis of SARS-CoV-2 by FDA under an Emergency Use Authorization (EUA). This EUA will remain  in effect (meaning this test can be used) for the duration of the COVID-19 declaration under Section 564(b)(1) of the Act, 21 U.S.C.section 360bbb-3(b)(1), unless the authorization is terminated  or revoked sooner.       Influenza A by PCR NEGATIVE NEGATIVE Final   Influenza B by PCR NEGATIVE NEGATIVE Final    Comment: (NOTE) The Xpert Xpress SARS-CoV-2/FLU/RSV plus assay is intended as an aid in the diagnosis of influenza from Nasopharyngeal swab specimens and should not be used as a sole basis for treatment. Nasal washings and aspirates are unacceptable for Xpert Xpress  SARS-CoV-2/FLU/RSV testing.  Fact Sheet for Patients: EntrepreneurPulse.com.au  Fact Sheet for Healthcare Providers: IncredibleEmployment.be  This test is not yet approved or cleared by the [place] FDA and has been authorized for detection and/or diagnosis of SARS-CoV-2 by FDA under an Emergency Use Authorization (EUA). This EUA will remain in effect (meaning this test can be used) for the duration of the COVID-19 declaration under Section  564(b)(1) of the Act, 21 U.S.C. section 360bbb-3(b)(1), unless the authorization is terminated or revoked.  Performed at [facility], [address], [place], [place] [address]      Labs: BNP (last 3 results) Recent Labs    10/01/20 1609  BNP 417.4*   Basic Metabolic Panel: Recent Labs  Lab 02/21/21 0542 02/22/21 0548  NA 141 142  K 3.7 3.8  CL 110 110  CO2 23 24  GLUCOSE 132* 89  BUN 39* 29*  CREATININE 1.33* 0.91  CALCIUM 8.5* 8.3*   Liver Function Tests: Recent Labs  Lab 02/21/21 0542  AST 25  ALT 21  ALKPHOS 61  BILITOT 0.7  PROT 5.6*  ALBUMIN 3.1*   No results for input(s): LIPASE, AMYLASE in the last 168 hours. No results for input(s): AMMONIA in the last 168 hours. CBC: Recent Labs  Lab 02/21/21 0542 02/21/21 1211 02/22/21 0548  WBC 4.8 4.6 3.8*  NEUTROABS 2.4  --  1.9  HGB 10.6* 8.8* 8.2*  HCT 31.1* 26.7* 24.2*  MCV 89.9 91.1 90.0  PLT 117* 91* 86*   Cardiac Enzymes: No results for input(s): CKTOTAL, CKMB, CKMBINDEX, TROPONINI in the last 168 hours. BNP: Invalid input(s): POCBNP CBG: No results for input(s): GLUCAP in the last 168 hours. D-Dimer No results for input(s): DDIMER in the last 72 hours. Hgb A1c No results for input(s): HGBA1C in the last 72 hours. Lipid Profile No results for input(s): CHOL, HDL, LDLCALC, TRIG, CHOLHDL, LDLDIRECT in the last 72 hours. Thyroid function studies No results for input(s): TSH, T4TOTAL, T3FREE, THYROIDAB in the  last 72 hours.  Invalid input(s): FREET3 Anemia work up Recent Labs    02/21/21 0542  VITAMINB12 397  FOLATE 45.5  FERRITIN 79  TIBC 244*  IRON 56  RETICCTPCT 0.8   Urinalysis No results found for: COLORURINE, APPEARANCEUR, LABSPEC, [place], GLUCOSEU, [place], [place], [place], PROTEINUR, UROBILINOGEN, NITRITE, LEUKOCYTESUR Sepsis Labs Invalid input(s): PROCALCITONIN,  WBC,  LACTICIDVEN Microbiology Recent Results (from the past 240 hour(s))  Resp Panel by RT-PCR (Flu A&B, Covid) Nasopharyngeal Swab     Status: None   Collection Time: 02/21/21  6:24 AM   Specimen: Nasopharyngeal Swab; Nasopharyngeal(NP) swabs in vial transport medium  Result Value Ref Range Status   SARS Coronavirus 2 by RT PCR NEGATIVE NEGATIVE Final    Comment: (NOTE) SARS-CoV-2 target nucleic acids are NOT DETECTED.  The SARS-CoV-2 RNA is generally detectable in upper respiratory specimens during the acute phase of infection. The lowest concentration of SARS-CoV-2 viral copies this assay can detect is 138 copies/mL. A negative result does not preclude SARS-Cov-2 infection and should not be used as the sole basis for treatment or other patient management decisions. A negative result may occur with  improper specimen collection/handling, submission of specimen other than nasopharyngeal swab, presence of viral mutation(s) within the areas targeted by this assay, and inadequate number of viral copies(<138 copies/mL). A negative result must be combined with clinical observations, patient history, and epidemiological information. The expected result is Negative.  Fact Sheet for Patients:  EntrepreneurPulse.com.au  Fact Sheet for Healthcare Providers:  IncredibleEmployment.be  This test is no t yet approved or cleared by the [place] FDA and  has been authorized for detection and/or diagnosis of SARS-CoV-2 by FDA under an Emergency Use Authorization (EUA). This  EUA will remain  in effect (meaning this test can be used) for the duration of the COVID-19 declaration under Section 564(b)(1) of the Act, 21 U.S.C.section 360bbb-3(b)(1), unless the authorization is  terminated  or revoked sooner.       Influenza A by PCR NEGATIVE NEGATIVE Final   Influenza B by PCR NEGATIVE NEGATIVE Final    Comment: (NOTE) The Xpert Xpress SARS-CoV-2/FLU/RSV plus assay is intended as an aid in the diagnosis of influenza from Nasopharyngeal swab specimens and should not be used as a sole basis for treatment. Nasal washings and aspirates are unacceptable for Xpert Xpress SARS-CoV-2/FLU/RSV testing.  Fact Sheet for Patients: EntrepreneurPulse.com.au  Fact Sheet for Healthcare Providers: IncredibleEmployment.be  This test is not yet approved or cleared by the [place] FDA and has been authorized for detection and/or diagnosis of SARS-CoV-2 by FDA under an Emergency Use Authorization (EUA). This EUA will remain in effect (meaning this test can be used) for the duration of the COVID-19 declaration under Section 564(b)(1) of the Act, 21 U.S.C. section 360bbb-3(b)(1), unless the authorization is terminated or revoked.  Performed at [facility], [address] [place], [place] [address]    Time coordinating discharge: 38 mins   SIGNED:  Irwin Brakeman, MD  [facility] 02/22/2021, 9:55 AM How to contact the [facility] Attending or Consulting provider [place] or covering provider during after hours [place], for this patient?  1. Check the care team in [facility] and look for a) attending/consulting TR[facility] provider listed and b) the [facility] team listed 2. Log into [email or web address] and use [place]'s universal password to access. If you do not have the password, please contact the hospital operator. 3. Locate the [facility] provider you are looking for under [facility] and page to a number that you can be directly reached. 4. If you  still have difficulty reaching the provider, please page the [facility] (Director on Call) for the Hospitalists listed on amion for assistance.

## 2021-02-22 NOTE — Progress Notes (Addendum)
Pt has discharge orders, discharge teaching given and no further questions at this time. Pt will be wheeled down via w/c to vehicle accompanied by daughter.

## 2021-02-26 ENCOUNTER — Ambulatory Visit (INDEPENDENT_AMBULATORY_CARE_PROVIDER_SITE_OTHER): Payer: Medicare Other | Admitting: Obstetrics & Gynecology

## 2021-02-26 ENCOUNTER — Other Ambulatory Visit: Payer: Self-pay

## 2021-02-26 ENCOUNTER — Encounter: Payer: Self-pay | Admitting: Obstetrics & Gynecology

## 2021-02-26 VITALS — BP 115/57 | HR 86 | Wt 129.0 lb

## 2021-02-26 DIAGNOSIS — N903 Dysplasia of vulva, unspecified: Secondary | ICD-10-CM

## 2021-02-26 NOTE — Progress Notes (Signed)
  HPI: Patient returns for routine postoperative follow-up having undergone 4 mm punch biopsy on 3/7.  The patient's immediate postoperative recovery has been unremarkable. Since hospital discharge the patient reports bleeding 10 days later to to over coagulation from eliquis.   Current Outpatient Medications: atorvastatin (LIPITOR) 20 MG tablet, Take 20 mg by mouth daily., Disp: , Rfl:  Cholecalciferol 50 MCG (2000 UT) CAPS, Take 2,000 mg by mouth daily., Disp: , Rfl:  diltiazem (CARDIZEM CD) 120 MG 24 hr capsule, Take 1 capsule (120 mg total) by mouth daily., Disp: 30 capsule, Rfl: 1 levothyroxine (SYNTHROID) 100 MCG tablet, Take 150 mcg by mouth daily before breakfast., Disp: , Rfl:  nitroGLYCERIN (NITROSTAT) 0.4 MG SL tablet, Place 0.4 mg under the tongue every 5 (five) minutes as needed for chest pain., Disp: , Rfl:   No current facility-administered medications for this visit.    Blood pressure (!) 115/57, pulse 86, weight 129 lb (58.5 kg).  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

## 2021-03-04 ENCOUNTER — Inpatient Hospital Stay (HOSPITAL_COMMUNITY): Payer: Medicare Other | Attending: Hematology | Admitting: Hematology

## 2021-03-04 ENCOUNTER — Encounter (HOSPITAL_COMMUNITY): Payer: Self-pay | Admitting: Hematology

## 2021-03-04 ENCOUNTER — Other Ambulatory Visit (HOSPITAL_COMMUNITY): Payer: Self-pay | Admitting: Surgery

## 2021-03-04 ENCOUNTER — Other Ambulatory Visit: Payer: Self-pay

## 2021-03-04 VITALS — BP 108/47 | HR 62 | Temp 96.8°F | Resp 16 | Ht 64.0 in | Wt 132.2 lb

## 2021-03-04 DIAGNOSIS — D696 Thrombocytopenia, unspecified: Secondary | ICD-10-CM | POA: Diagnosis present

## 2021-03-04 DIAGNOSIS — Z853 Personal history of malignant neoplasm of breast: Secondary | ICD-10-CM

## 2021-03-04 DIAGNOSIS — Z79899 Other long term (current) drug therapy: Secondary | ICD-10-CM | POA: Diagnosis not present

## 2021-03-04 DIAGNOSIS — K760 Fatty (change of) liver, not elsewhere classified: Secondary | ICD-10-CM | POA: Diagnosis not present

## 2021-03-04 DIAGNOSIS — K7460 Unspecified cirrhosis of liver: Secondary | ICD-10-CM | POA: Diagnosis not present

## 2021-03-04 DIAGNOSIS — I4891 Unspecified atrial fibrillation: Secondary | ICD-10-CM | POA: Diagnosis not present

## 2021-03-04 DIAGNOSIS — Z818 Family history of other mental and behavioral disorders: Secondary | ICD-10-CM | POA: Insufficient documentation

## 2021-03-04 DIAGNOSIS — Z7901 Long term (current) use of anticoagulants: Secondary | ICD-10-CM | POA: Diagnosis not present

## 2021-03-04 DIAGNOSIS — K921 Melena: Secondary | ICD-10-CM | POA: Diagnosis not present

## 2021-03-04 DIAGNOSIS — Z8249 Family history of ischemic heart disease and other diseases of the circulatory system: Secondary | ICD-10-CM | POA: Insufficient documentation

## 2021-03-04 DIAGNOSIS — Z86000 Personal history of in-situ neoplasm of breast: Secondary | ICD-10-CM | POA: Diagnosis not present

## 2021-03-04 DIAGNOSIS — R5383 Other fatigue: Secondary | ICD-10-CM | POA: Diagnosis not present

## 2021-03-04 DIAGNOSIS — D500 Iron deficiency anemia secondary to blood loss (chronic): Secondary | ICD-10-CM | POA: Diagnosis not present

## 2021-03-04 LAB — RETICULOCYTES
Immature Retic Fract: 13 % (ref 2.3–15.9)
RBC.: 3.41 MIL/uL — ABNORMAL LOW (ref 3.87–5.11)
Retic Count, Absolute: 79.5 10*3/uL (ref 19.0–186.0)
Retic Ct Pct: 2.4 % (ref 0.4–3.1)

## 2021-03-04 NOTE — Patient Instructions (Signed)
[place] at [facility] Discharge Instructions  You were seen today by Dr. Delton Coombes and Tarri Abernethy PA-C for your low platelets (thrombocytopenia) and low blood count (iron deficiency anemia).    LABS: Get one set of labs done today to investigate other possible causes of your blood abnormalities.  Return in 6 weeks to repeat labs for your blood count and iron.  OTHER TESTS: Screening mammogram due to your history of breast cancer.  MEDICATIONS: Start taking daily iron supplement (any over-the-counter iron supplement will be fine) - stop iron if I causes significant constipation or stomach upset.  ** Make appointment for IV iron [place] [place]) x 2  FOLLOW-UP APPOINTMENT: Return for follow-up with Tarri Abernethy, PA-C in 6 weeks. - Follow up with your PCP this week and ask for referral to local cardiologist. - Continue follow-up with gynecologist and surgeon for vulvar dysplasia. - Make an appointment with your GI doctor for EGD and colonoscopy as soon as possible due to possible blood in your stool (black tarry stool)   Thank you for choosing [place] at [facility] to provide your oncology and hematology care.  To afford each patient quality time with our provider, please arrive at least 15 minutes before your scheduled appointment time.   If you have a lab appointment with the [facility] please come in thru the Main Entrance and check in at the main information desk.  You need to re-schedule your appointment should you arrive 10 or more minutes late.  We strive to give you quality time with our providers, and arriving late affects you and other patients whose appointments are after yours.  Also, if you no show three or more times for appointments you may be dismissed from the clinic at the providers discretion.     Again, thank you for choosing [facility].  Our hope is that these requests will decrease the amount of  time that you wait before being seen by our physicians.       _____________________________________________________________  Should you have questions after your visit to [facility], please contact our office at [phone] and follow the prompts.  Our office hours are 8:00 a.m. and 4:30 p.m. Monday - Friday.  Please note that voicemails left after 4:00 p.m. may not be returned until the following business day.  We are closed weekends and major holidays.  You do have access to a nurse 24-7, just call the main number to the clinic [phone] and do not press any options, hold on the line and a nurse will answer the phone.    For prescription refill requests, have your pharmacy contact our office and allow 72 hours.    Due to Covid, you will need to wear a mask upon entering the hospital. If you do not have a mask, a mask will be given to you at the Main Entrance upon arrival. For doctor visits, patients may have 1 support person age 16 or older with them. For treatment visits, patients can not have anyone with them due to social distancing guidelines and our immunocompromised population.

## 2021-03-04 NOTE — Progress Notes (Addendum)
[place] [address], [place] [address]   CLINIC:  Medical Oncology/Hematology  CONSULT NOTE  Patient Care Team: Neale Burly, MD as PCP - General (Internal Medicine)  CHIEF COMPLAINTS/PURPOSE OF CONSULTATION:  Thrombocytopenia  HISTORY OF PRESENTING ILLNESS:  Tasha Roberts 80 y.o. female is here because of thrombocytopenia.  She was referred by Dr. Irwin Brakeman MD (hospitalist) due to low platelets noted during recent hospital stay.  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  Review of outside records available through [facility] showed platelets as low as 74 in 2016 (via [facility]), has been ranging from mild to moderate thrombocytopenia since that time.  Most recent CBC on 02/22/2021 showed platelets 86, hemoglobin 8.2, and WBC 3.8.  Iron panel on 02/21/2021 showed ferritin 79, serum iron 56, saturation 23%.  Folate and B12 were within normal limits.  Ms. Sommers was recently hospitalized from 02/21/21 - 02/22/21 for post-operative bleeding after vulvar biopsy. (She is on Eliquis for Afib).  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  She was kept overnight in the hospital for observation, noted to have hemoglobin drop from 10.6 to 8.2.  She did not require blood transfusion, but Eliquis was held at discharge, until follow-up with GYN (Dr. Elonda Husky) and Gyn-Onc (Dr. Denman George) for surgical excision of vulvar dysplasia.  Patient reports that she has had ongoing black tarry stools for the past 2 to 3 years.  She does have a GI doctor (Dr. Merrilee Jansky at [facility]) that she sees regularly.  Her last EGD and colonoscopy were performed 3 years ago, but she is uncertain of the results.  She denies any previous knowledge of varices.  She is due for another EGD/colonoscopy in July 2022.  She denies other sources of bleeding - no epistaxis, bleeding gums,  hematuria, hematemesis, hematochezia.  She does bruise very easily.  She denies petechial rashes.  Patient reports significant fatigue following recent blood loss, current energy reported to be 50%.  She denies recent chest pain on exertion, shortness of breath on minimal exertion, or presyncopal episodes.  She does report recent palpitations. She denies over-the-counter NSAID ingestion.  She is not on antiplatelet agents. She reports that over the past year she has had very poor appetite secondary to stress related to her husband's decline from dementia, and has subsequently lost about 50 pounds.  She denies any pica and eats a variety of diet. 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  Patient did not receive chemotherapy.  She completed radiation on 01/23/2013.  She completed tamoxifen in November 2018.  Bilateral diagnostic mammogram with left ultrasound on 08/02/2019 was benign.  Previously followed with the breast care center at [facility] in [place], [place] - recently relocated to [place].  (External notes from NP Johnsie Kindred reviewed by me).  Patient is a lifelong non-smoker.  No tobacco or illicit drug use. She ambulates with a Rollator due to generalized weakness. She currently lives at home with her daughter.  Patient's husband is a resident in a memory care unit due to advanced dementia. Patient retired from work as a Barista.  MEDICAL HISTORY:  Past Medical History:  Diagnosis Date  . Atrial fibrillation ([place])   .  CAD (coronary artery disease)   . Cirrhosis ([place])   . Depression with anxiety   . Diabetes mellitus ([place])   . Hemorrhage   . Hyperlipidemia   . Hypertension   . Hypothyroidism   . NASH (nonalcoholic steatohepatitis)   . Sarcoidosis 1965    SURGICAL  HISTORY: Past Surgical History:  Procedure Laterality Date  . ABDOMINAL HYSTERECTOMY     partial  . BREAST SURGERY    . CHOLECYSTECTOMY      SOCIAL HISTORY: Social History   Socioeconomic History  . Marital status: Married    Spouse name: Not on file  . Number of children: 3  . Years of education: Not on file  . Highest education level: Not on file  Occupational History  . Occupation: retired  Tobacco Use  . Smoking status: Never Smoker  . Smokeless tobacco: Never Used  Vaping Use  . Vaping Use: Never used  Substance and Sexual Activity  . Alcohol use: Not Currently    Comment: has not drank alcohol in 20-30 years  . Drug use: Never  . Sexual activity: Not Currently    Birth control/protection: Post-menopausal  Other Topics Concern  . Not on file  Social History Narrative  . Not on file   Social Determinants of Health   Financial Resource Strain: Low Risk   . Difficulty of Paying Living Expenses: Not hard at all  Food Insecurity: No Food Insecurity  . Worried About Charity fundraiser in the Last Year: Never true  . Ran Out of Food in the Last Year: Never true  Transportation Needs: No Transportation Needs  . Lack of Transportation (Medical): No  . Lack of Transportation (Non-Medical): No  Physical Activity: Inactive  . Days of Exercise per Week: 0 days  . Minutes of Exercise per Session: 0 min  Stress: Stress Concern Present  . Feeling of Stress : To some extent  Social Connections: Moderately Integrated  . Frequency of Communication with Friends and Family: More than three times a week  . Frequency of Social Gatherings with Friends and Family: More than three times a week  . Attends Religious Services: More than 4 times per year  . Active Member of Clubs or Organizations: No  . Attends Archivist Meetings: Never  . Marital Status: Married  Human resources officer Violence: Not At Risk  . Fear of Current or Ex-Partner: No  . Emotionally Abused: No  .  Physically Abused: No  . Sexually Abused: No    FAMILY HISTORY: Family History  Problem Relation Age of Onset  . Hypertension Mother   . Dementia Mother   . Heart disease Father   . Heart disease Brother     ALLERGIES:  is allergic to dicyclomine, metformin and related, and losartan.  MEDICATIONS:  Current Outpatient Medications  Medication Sig Dispense Refill  . atorvastatin (LIPITOR) 20 MG tablet Take 20 mg by mouth daily.    . Cholecalciferol 50 MCG (2000 UT) CAPS Take 2,000 mg by mouth daily.    Marland Kitchen diltiazem (CARDIZEM CD) 120 MG 24 hr capsule Take 1 capsule (120 mg total) by mouth daily. 30 capsule 1  . levothyroxine (SYNTHROID) 100 MCG tablet Take 150 mcg by mouth daily before breakfast.    . nitroGLYCERIN (NITROSTAT) 0.4 MG SL tablet Place 0.4 mg under the tongue every 5 (five) minutes as needed for chest pain.     No current facility-administered medications for this visit.    REVIEW OF SYSTEMS:  Review of Systems  Constitutional: Positive for appetite change and fatigue. Negative for chills, diaphoresis, fever and unexpected weight change.  HENT:   Negative for lump/mass and nosebleeds.   Eyes: Negative for eye problems.  Respiratory: Negative for cough, hemoptysis and shortness of breath.   Cardiovascular: Positive for palpitations. Negative for chest pain and leg swelling.  Gastrointestinal: Positive for blood in stool (Black, tarry stool). Negative for abdominal pain, constipation, diarrhea, nausea and vomiting.  Genitourinary: Negative for hematuria.   Skin: Negative.   Neurological: Negative for dizziness, headaches and light-headedness.  Hematological: Bruises/bleeds easily.      PHYSICAL EXAMINATION: ECOG PERFORMANCE STATUS: 1 - Symptomatic but completely ambulatory  Vitals:   03/04/21 1407  BP: (!) 108/47  Pulse: 62  Resp: 16  Temp: (!) 96.8 F (36 C)  SpO2: 100%   Filed Weights   03/04/21 1407  Weight: 132 lb 3.2 oz (60 kg)    Physical  Exam Constitutional:      Appearance: Normal appearance.  HENT:     Head: Normocephalic and atraumatic.     Mouth/Throat:     Mouth: Mucous membranes are moist.  Eyes:     Extraocular Movements: Extraocular movements intact.     Pupils: Pupils are equal, round, and reactive to light.  Cardiovascular:     Rate and Rhythm: Normal rate and regular rhythm.     Pulses: Normal pulses.     Heart sounds: Normal heart sounds.  Pulmonary:     Effort: Pulmonary effort is normal.     Breath sounds: Normal breath sounds.  Abdominal:     General: Bowel sounds are normal.     Palpations: Abdomen is soft.     Tenderness: There is no abdominal tenderness.  Musculoskeletal:        General: No swelling.     Right lower leg: No edema.     Left lower leg: No edema.  Lymphadenopathy:     Cervical: No cervical adenopathy.  Skin:    General: Skin is warm and dry.  Neurological:     General: No focal deficit present.     Mental Status: She is alert and oriented to person, place, and time.  Psychiatric:        Mood and Affect: Mood normal.        Behavior: Behavior normal.       LABORATORY DATA:  I have reviewed the data as listed Recent Results (from the past 2160 hour(s))  CBC with Differential     Status: Abnormal   Collection Time: 02/21/21  5:42 AM  Result Value Ref Range   WBC 4.8 4.0 - 10.5 K/uL   RBC 3.46 (L) 3.87 - 5.11 MIL/uL   Hemoglobin 10.6 (L) 12.0 - 15.0 g/dL   HCT 31.1 (L) 36.0 - 46.0 %   MCV 89.9 80.0 - 100.0 fL   MCH 30.6 26.0 - 34.0 pg   MCHC 34.1 30.0 - 36.0 g/dL   RDW 13.6 11.5 - 15.5 %   Platelets 117 (L) 150 - 400 K/uL    Comment: SPECIMEN CHECKED FOR CLOTS Immature Platelet Fraction may be clinically indicated, consider ordering this additional test [id number] PLATELET COUNT CONFIRMED BY SMEAR    nRBC 0.0 0.0 - 0.2 %   Neutrophils Relative % 50 %   Neutro Abs 2.4 1.7 - 7.7 K/uL   Lymphocytes Relative 33 %   Lymphs Abs 1.6 0.7 - 4.0 K/uL   Monocytes  Relative 13 %   Monocytes  Absolute 0.6 0.1 - 1.0 K/uL   Eosinophils Relative 3 %   Eosinophils Absolute 0.2 0.0 - 0.5 K/uL   Basophils Relative 1 %   Basophils Absolute 0.0 0.0 - 0.1 K/uL   Immature Granulocytes 0 %   Abs Immature Granulocytes 0.01 0.00 - 0.07 K/uL    Comment: Performed at [facility], [address] [place], [place] [address]  Comprehensive metabolic panel     Status: Abnormal   Collection Time: 02/21/21  5:42 AM  Result Value Ref Range   Sodium 141 135 - 145 mmol/L   Potassium 3.7 3.5 - 5.1 mmol/L   Chloride 110 98 - 111 mmol/L   CO2 23 22 - 32 mmol/L   Glucose, Bld 132 (H) 70 - 99 mg/dL    Comment: Glucose reference range applies only to samples taken after fasting for at least 8 hours.   BUN 39 (H) 8 - 23 mg/dL   Creatinine, Ser 1.33 (H) 0.44 - 1.00 mg/dL   Calcium 8.5 (L) 8.9 - 10.3 mg/dL   Total Protein 5.6 (L) 6.5 - 8.1 g/dL   Albumin 3.1 (L) 3.5 - 5.0 g/dL   AST 25 15 - 41 U/L   ALT 21 0 - 44 U/L   Alkaline Phosphatase 61 38 - 126 U/L   Total Bilirubin 0.7 0.3 - 1.2 mg/dL   GFR, Estimated 41 (L) >60 mL/min    Comment: (NOTE) Calculated using the CKD-EPI Creatinine Equation (2021)    Anion gap 8 5 - 15    Comment: Performed at [facility], [address], [place], [place] [address]  Type and screen [facility]     Status: None   Collection Time: 02/21/21  5:42 AM  Result Value Ref Range   ABO/RH(D) AB POS    Antibody Screen NEG    Sample Expiration      02/24/2021,2359 Performed at [facility], [address] [place], [place] [address]   Vitamin B12     Status: None   Collection Time: 02/21/21  5:42 AM  Result Value Ref Range   Vitamin B-12 397 180 - 914 pg/mL    Comment: (NOTE) This assay is not validated for testing neonatal or myeloproliferative syndrome specimens for Vitamin B12 levels. Performed at [facility], [address] [place], [place] [address]   Folate     Status: None   Collection Time: 02/21/21  5:42 AM  Result  Value Ref Range   Folate 45.5 >5.9 ng/mL    Comment: RESULTS CONFIRMED BY MANUAL DILUTION Performed at [facility], [address], [place], [place] [address]   Iron and TIBC     Status: Abnormal   Collection Time: 02/21/21  5:42 AM  Result Value Ref Range   Iron 56 28 - 170 ug/dL   TIBC 244 (L) 250 - 450 ug/dL   Saturation Ratios 23 10.4 - 31.8 %   UIBC 188 ug/dL    Comment: Performed at [facility], [address], [place], [place] [address]  Ferritin     Status: None   Collection Time: 02/21/21  5:42 AM  Result Value Ref Range   Ferritin 79 11 - 307 ng/mL    Comment: Performed at [facility], [address] [place],  [address]  Reticulocytes     Status: Abnormal   Collection Time: 02/21/21  5:42 AM  Result Value Ref Range   Retic Ct Pct 0.8 0.4 - 3.1 %   RBC. 3.45 (L) 3.87 -  5.11 MIL/uL   Retic Count, Absolute 28.6 19.0 - 186.0 K/uL   Immature Retic Fract 6.2 2.3 - 15.9 %    Comment: Performed at [facility], [address], [place], [place] [address]  Resp Panel by RT-PCR (Flu A&B, Covid) Nasopharyngeal Swab     Status: None   Collection Time: 02/21/21  6:24 AM   Specimen: Nasopharyngeal Swab; Nasopharyngeal(NP) swabs in vial transport medium  Result Value Ref Range   SARS Coronavirus 2 by RT PCR NEGATIVE NEGATIVE    Comment: (NOTE) SARS-CoV-2 target nucleic acids are NOT DETECTED.  The SARS-CoV-2 RNA is generally detectable in upper respiratory specimens during the acute phase of infection. The lowest concentration of SARS-CoV-2 viral copies this assay can detect is 138 copies/mL. A negative result does not preclude SARS-Cov-2 infection and should not be used as the sole basis for treatment or other patient management decisions. A negative result may occur with  improper specimen collection/handling, submission of specimen other than nasopharyngeal swab, presence of viral mutation(s) within the areas targeted by this assay, and inadequate number of  viral copies(<138 copies/mL). A negative result must be combined with clinical observations, patient history, and epidemiological information. The expected result is Negative.  Fact Sheet for Patients:  EntrepreneurPulse.com.au  Fact Sheet for Healthcare Providers:  IncredibleEmployment.be  This test is no t yet approved or cleared by the [place] FDA and  has been authorized for detection and/or diagnosis of SARS-CoV-2 by FDA under an Emergency Use Authorization (EUA). This EUA will remain  in effect (meaning this test can be used) for the duration of the COVID-19 declaration under Section 564(b)(1) of the Act, 21 U.S.C.section 360bbb-3(b)(1), unless the authorization is terminated  or revoked sooner.       Influenza A by PCR NEGATIVE NEGATIVE   Influenza B by PCR NEGATIVE NEGATIVE    Comment: (NOTE) The Xpert Xpress SARS-CoV-2/FLU/RSV plus assay is intended as an aid in the diagnosis of influenza from Nasopharyngeal swab specimens and should not be used as a sole basis for treatment. Nasal washings and aspirates are unacceptable for Xpert Xpress SARS-CoV-2/FLU/RSV testing.  Fact Sheet for Patients: EntrepreneurPulse.com.au  Fact Sheet for Healthcare Providers: IncredibleEmployment.be  This test is not yet approved or cleared by the [place] FDA and has been authorized for detection and/or diagnosis of SARS-CoV-2 by FDA under an Emergency Use Authorization (EUA). This EUA will remain in effect (meaning this test can be used) for the duration of the COVID-19 declaration under Section 564(b)(1) of the Act, 21 U.S.C. section 360bbb-3(b)(1), unless the authorization is terminated or revoked.  Performed at [facility], [address], [place],  [address]   CBC     Status: Abnormal   Collection Time: 02/21/21 12:11 PM  Result Value Ref Range   WBC 4.6 4.0 - 10.5 K/uL   RBC 2.93 (L) 3.87  - 5.11 MIL/uL   Hemoglobin 8.8 (L) 12.0 - 15.0 g/dL   HCT 26.7 (L) 36.0 - 46.0 %   MCV 91.1 80.0 - 100.0 fL   MCH 30.0 26.0 - 34.0 pg   MCHC 33.0 30.0 - 36.0 g/dL   RDW 13.7 11.5 - 15.5 %   Platelets 91 (L) 150 - 400 K/uL    Comment: SPECIMEN CHECKED FOR CLOTS Immature Platelet Fraction may be clinically indicated, consider ordering this additional test [id number] PLATELET COUNT CONFIRMED BY SMEAR    nRBC 0.0 0.0 - 0.2 %    Comment: Performed at [facility], [place]  [address] [place], [place] [address]  CBC with Differential/Platelet     Status: Abnormal   Collection Time: 02/22/21  5:48 AM  Result Value Ref Range   WBC 3.8 (L) 4.0 - 10.5 K/uL   RBC 2.69 (L) 3.87 - 5.11 MIL/uL   Hemoglobin 8.2 (L) 12.0 - 15.0 g/dL   HCT 24.2 (L) 36.0 - 46.0 %   MCV 90.0 80.0 - 100.0 fL   MCH 30.5 26.0 - 34.0 pg   MCHC 33.9 30.0 - 36.0 g/dL   RDW 13.4 11.5 - 15.5 %   Platelets 86 (L) 150 - 400 K/uL    Comment: SPECIMEN CHECKED FOR CLOTS Immature Platelet Fraction may be clinically indicated, consider ordering this additional test [id number] CONSISTENT WITH PREVIOUS RESULT REPEATED TO VERIFY    nRBC 0.0 0.0 - 0.2 %   Neutrophils Relative % 49 %   Neutro Abs 1.9 1.7 - 7.7 K/uL   Lymphocytes Relative 35 %   Lymphs Abs 1.4 0.7 - 4.0 K/uL   Monocytes Relative 13 %   Monocytes Absolute 0.5 0.1 - 1.0 K/uL   Eosinophils Relative 3 %   Eosinophils Absolute 0.1 0.0 - 0.5 K/uL   Basophils Relative 0 %   Basophils Absolute 0.0 0.0 - 0.1 K/uL   Immature Granulocytes 0 %   Abs Immature Granulocytes 0.01 0.00 - 0.07 K/uL    Comment: Performed at [facility], [address] [place], [place] [address]  Basic metabolic panel     Status: Abnormal   Collection Time: 02/22/21  5:48 AM  Result Value Ref Range   Sodium 142 135 - 145 mmol/L   Potassium 3.8 3.5 - 5.1 mmol/L   Chloride 110 98 - 111 mmol/L   CO2 24 22 - 32 mmol/L   Glucose, Bld 89 70 - 99 mg/dL    Comment: Glucose reference range applies  only to samples taken after fasting for at least 8 hours.   BUN 29 (H) 8 - 23 mg/dL   Creatinine, Ser 0.91 0.44 - 1.00 mg/dL   Calcium 8.3 (L) 8.9 - 10.3 mg/dL   GFR, Estimated >60 >60 mL/min    Comment: (NOTE) Calculated using the CKD-EPI Creatinine Equation (2021)    Anion gap 8 5 - 15    Comment: Performed at [facility], [address], [place], [place] [address]    RADIOGRAPHIC STUDIES: I have personally reviewed the radiological images as listed and agreed with the findings in the report. No results found.  ASSESSMENT: 1.  Thrombocytopenia, moderate -Ongoing problem since at least 2016 (platelets 74) -has been ranging from mild to moderate thrombocytopenia for the past 5 years -CBC (02/22/2021) with platelets 86 -Folate and B12 within normal limits -Likely secondary to NASH cirrhosis and splenomegaly -CT abdomen (04/30/2020 at [facility]) showed cirrhosis of the liver without evidence of HCC; splenomegaly and small caliber upper abdominal venous collaterals were noted -Suspect chronic blood loss due to patient report of ongoing melena for 2 to 3 years -Recent bleeding episode following vulvar biopsy likely related to Eliquis in addition to thrombocytopenia  2.  Anemia secondary to blood loss -CBC on 02/22/2021 with hemoglobin 8.2 -Iron panel on 02/21/2021 with ferritin 79, serum iron 56, saturation 23% -Reports ongoing melena for the past 2 to 3 years, is scheduled for EGD/colonoscopy with her GI provider in July 2022 -Recent blood loss from postop bleeding following vulvar biopsy (02/11/2021) -No history of blood transfusions -Has been on Eliquis for atrial fibrillation (currently being held while awaiting  gynecology approval to restart after vulvar biopsy and upcoming vulvar surgery)  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  ER/PR positive. -Completed XRT on 01/23/2013. -She does not recollect  taking any tamoxifen. -Bilateral diagnostic mammogram with left ultrasound on 08/02/2019 was benign. -Previously followed with the breast care center at [facility] in [place], [place] - recently relocated to [place].  (External notes from NP Johnsie Kindred reviewed by me)  4.  Social history -Patient is a lifelong non-smoker.  No tobacco or illicit drug use. -She ambulates with a Rollator due to generalized weakness. -She currently lives at home with her daughter.  Patient's husband is a resident in a memory care unit due to advanced dementia. -Patient retired from work as a Barista.   PLAN:  1. Thrombocytopenia, moderate -Likely secondary to liver cirrhosis and splenomegaly  -We will complete work-up to exclude other contributing factors/causes of thrombocytopenia - MMA, copper, vitamin D, hepatitis panel, HIV screen, ANA, RF, peripheral blood smear review, LDH, SPEP, and H. pylori -Recently checked (01/07/8656) folic acid and Q46 were within normal limits -RTC in 6 weeks to discuss results  2.  Normocytic anemia, secondary to acute blood loss -Likely secondary to longstanding chronic disease, occult GI blood loss, and recent postoperative bleeding after vulvar biopsy while on Eliquis) -Investigate other causes of anemia with LDH, SPEP, nutritional panel, haptoglobin, reticulocytes, and erythropoeitin -IV Feraheme x2 due to significant fatigue and recent acute blood loss -Trial of oral iron therapy for maintenance -Repeat CBC and iron panel in 6 weeks, RTC to discuss labs -Recommend follow-up with GI doctor as soon as possible for EGD and colonoscopy  3.  History of left breast DCIS in 2013: -Patient is due for her annual screening mammogram -We will order mammogram and follow-up results at visit in 6 weeks   PLAN SUMMARY & DISPOSITION: -IV Feraheme x2 (premedication with Benadryl and Tylenol) -Labs today -Labs in 6  weeks -Schedule mammogram -Return to clinic in 6 weeks to discuss lab results -Continue follow-up with GI, cardiology, gynecology, PCP  All questions were answered. The patient knows to call the clinic with any problems, questions or concerns.   Medical decision making: Moderate (1 new problem under work-up, review of external notes, review of previous lab and imaging studies, ordering new lab and imaging studies)  Time spent on visit: I spent 30 minutes counseling the patient face to face. The total time spent in the appointment was 45 minutes and more than 50% was on counseling.  I, Tarri Abernethy PA-C, have seen this patient in conjunction with Dr. Derek Jack. Greater than 50% of visit was performed by Dr. Delton Coombes. I have independently assessed this patient and agree with HPI and assessment and plan written by Tarri Abernethy PA-C.  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  Also has a history of DCIS of the left breast which was treated in 2013.  She now has iron deficiency state with normocytic anemia.  We are ordering further work-up and parenteral iron therapy at this time.   Derek Jack, MD 03/04/21 6:00 PM   ADDENDUM:  Patient called this morning to let us know that her husband passed away last night. Will delay her labs and follow-up for an additional 2-4 weeks, which is not expected to adversely affect her outcome. She knows to call if she has any concerns during  that time.

## 2021-03-05 ENCOUNTER — Telehealth: Payer: Self-pay | Admitting: *Deleted

## 2021-03-05 ENCOUNTER — Inpatient Hospital Stay (HOSPITAL_COMMUNITY): Payer: Medicare Other

## 2021-03-05 NOTE — Telephone Encounter (Signed)
Patient called and canceled her new patient appt for Friday; patient stated that she will call back to reschedule late. Patient stated that her husband passed away last night.

## 2021-03-08 ENCOUNTER — Ambulatory Visit: Payer: Medicare Other | Admitting: Gynecologic Oncology

## 2021-03-12 ENCOUNTER — Ambulatory Visit (HOSPITAL_COMMUNITY): Payer: Medicare Other

## 2021-03-12 ENCOUNTER — Telehealth: Payer: Self-pay | Admitting: *Deleted

## 2021-03-12 NOTE — Telephone Encounter (Signed)
Patient called and rescheduled her new patient appt to 4/13 at 11:15 am

## 2021-03-13 ENCOUNTER — Encounter (HOSPITAL_COMMUNITY): Payer: Self-pay | Admitting: Physician Assistant

## 2021-03-15 ENCOUNTER — Other Ambulatory Visit (HOSPITAL_COMMUNITY): Payer: Medicare Other

## 2021-03-15 ENCOUNTER — Encounter (HOSPITAL_COMMUNITY): Payer: Self-pay

## 2021-03-15 ENCOUNTER — Other Ambulatory Visit (HOSPITAL_COMMUNITY): Payer: Self-pay | Admitting: Physician Assistant

## 2021-03-15 ENCOUNTER — Inpatient Hospital Stay (HOSPITAL_COMMUNITY): Payer: Medicare Other | Attending: Hematology

## 2021-03-15 ENCOUNTER — Inpatient Hospital Stay (HOSPITAL_COMMUNITY): Payer: Medicare Other

## 2021-03-15 ENCOUNTER — Other Ambulatory Visit: Payer: Self-pay

## 2021-03-15 ENCOUNTER — Ambulatory Visit (HOSPITAL_COMMUNITY)
Admission: RE | Admit: 2021-03-15 | Discharge: 2021-03-15 | Disposition: A | Discharge status: Home / Self Care | Payer: Medicare Other | Source: Ambulatory Visit | Attending: Physician Assistant | Admitting: Physician Assistant

## 2021-03-15 VITALS — BP 99/43 | HR 46 | Temp 96.9°F | Resp 18

## 2021-03-15 DIAGNOSIS — Z818 Family history of other mental and behavioral disorders: Secondary | ICD-10-CM | POA: Diagnosis not present

## 2021-03-15 DIAGNOSIS — Z8249 Family history of ischemic heart disease and other diseases of the circulatory system: Secondary | ICD-10-CM | POA: Diagnosis not present

## 2021-03-15 DIAGNOSIS — D500 Iron deficiency anemia secondary to blood loss (chronic): Secondary | ICD-10-CM | POA: Diagnosis present

## 2021-03-15 DIAGNOSIS — Z79899 Other long term (current) drug therapy: Secondary | ICD-10-CM | POA: Insufficient documentation

## 2021-03-15 DIAGNOSIS — Z853 Personal history of malignant neoplasm of breast: Secondary | ICD-10-CM | POA: Insufficient documentation

## 2021-03-15 DIAGNOSIS — D696 Thrombocytopenia, unspecified: Secondary | ICD-10-CM

## 2021-03-15 DIAGNOSIS — Z1231 Encounter for screening mammogram for malignant neoplasm of breast: Secondary | ICD-10-CM | POA: Diagnosis not present

## 2021-03-15 DIAGNOSIS — R5383 Other fatigue: Secondary | ICD-10-CM | POA: Diagnosis not present

## 2021-03-15 DIAGNOSIS — I482 Chronic atrial fibrillation, unspecified: Secondary | ICD-10-CM

## 2021-03-15 LAB — LACTATE DEHYDROGENASE: LDH: 209 U/L — ABNORMAL HIGH (ref 98–192)

## 2021-03-15 LAB — VITAMIN D 25 HYDROXY (VIT D DEFICIENCY, FRACTURES): Vit D, 25-Hydroxy: 57.86 ng/mL (ref 30–100)

## 2021-03-15 MED ORDER — DIPHENHYDRAMINE HCL 25 MG PO CAPS
25.0000 mg | ORAL_CAPSULE | Freq: Once | ORAL | Status: AC
  Administered 2021-03-15: 25 mg via ORAL

## 2021-03-15 MED ORDER — DIPHENHYDRAMINE HCL 25 MG PO CAPS
ORAL_CAPSULE | ORAL | Status: AC
  Filled 2021-03-15: qty 1

## 2021-03-15 MED ORDER — ACETAMINOPHEN 325 MG PO TABS
ORAL_TABLET | ORAL | Status: AC
  Filled 2021-03-15: qty 2

## 2021-03-15 MED ORDER — SODIUM CHLORIDE 0.9 % IV SOLN: Freq: Once | INTRAVENOUS | Status: AC

## 2021-03-15 MED ORDER — ACETAMINOPHEN 325 MG PO TABS
650.0000 mg | ORAL_TABLET | Freq: Once | ORAL | Status: AC
  Administered 2021-03-15: 650 mg via ORAL

## 2021-03-15 MED ORDER — SODIUM CHLORIDE 0.9 % IV SOLN
510.0000 mg | Freq: Once | INTRAVENOUS | Status: AC
  Administered 2021-03-15: 510 mg via INTRAVENOUS
  Filled 2021-03-15: qty 510

## 2021-03-15 NOTE — Patient Instructions (Signed)
[facility]  Discharge Instructions:   _______________________________________________________________  Thank you for choosing [facility] at [facility] to provide your oncology and hematology care.  To afford each patient quality time with our providers, please arrive at least 15 minutes before your scheduled appointment.  You need to re-schedule your appointment if you arrive 10 or more minutes late.  We strive to give you quality time with our providers, and arriving late affects you and other patients whose appointments are after yours.  Also, if you no show three or more times for appointments you may be dismissed from the clinic.  Again, thank you for choosing [facility]. Our hope is that these requests will allow you access to exceptional care and in a timely manner. _______________________________________________________________  If you have questions after your visit, please contact our office at [phone] between the hours of 8:30 a.m. and 5:00 p.m. Voicemails left after 4:30 p.m. will not be returned until the following business day. _______________________________________________________________  For prescription refill requests, have your pharmacy contact our office. _______________________________________________________________  Recommendations made by the consultant and any test results will be sent to your referring physician. _______________________________________________________________ 

## 2021-03-15 NOTE — Progress Notes (Signed)
Iron infusion given per orders. Patient tolerated it well without problems. Vitals stable and discharged home from clinic ambulatory with her personal walker. Follow up as scheduled.

## 2021-03-16 LAB — HEPATITIS PANEL, ACUTE
HCV Ab: NONREACTIVE
Hep A IgM: NONREACTIVE
Hep B C IgM: NONREACTIVE
Hepatitis B Surface Ag: NONREACTIVE

## 2021-03-16 LAB — HIV ANTIBODY (ROUTINE TESTING W REFLEX): HIV Screen 4th Generation wRfx: NONREACTIVE

## 2021-03-18 LAB — COPPER, SERUM: Copper: 77 ug/dL — ABNORMAL LOW (ref 80–158)

## 2021-03-18 LAB — PATHOLOGIST SMEAR REVIEW

## 2021-03-19 ENCOUNTER — Ambulatory Visit (HOSPITAL_COMMUNITY): Payer: Medicare Other

## 2021-03-19 LAB — HAPTOGLOBIN: Haptoglobin: 62 mg/dL (ref 42–346)

## 2021-03-19 LAB — KAPPA/LAMBDA LIGHT CHAINS
Kappa free light chain: 31.1 mg/L — ABNORMAL HIGH (ref 3.3–19.4)
Kappa, lambda light chain ratio: 1.31 (ref 0.26–1.65)
Lambda free light chains: 23.8 mg/L (ref 5.7–26.3)

## 2021-03-19 LAB — ANA: Anti Nuclear Antibody (ANA): NEGATIVE

## 2021-03-19 LAB — RHEUMATOID FACTOR: Rheumatoid fact SerPl-aCnc: <10 IU/mL (ref <14.0)

## 2021-03-19 LAB — ERYTHROPOIETIN: Erythropoietin: 38.1 m[IU]/mL — ABNORMAL HIGH (ref 2.6–18.5)

## 2021-03-20 ENCOUNTER — Encounter: Payer: Self-pay | Admitting: Gynecologic Oncology

## 2021-03-20 ENCOUNTER — Inpatient Hospital Stay: Payer: Medicare Other | Attending: Gynecologic Oncology | Admitting: Gynecologic Oncology

## 2021-03-20 ENCOUNTER — Other Ambulatory Visit: Payer: Self-pay

## 2021-03-20 VITALS — BP 119/54 | HR 68 | Temp 97.5°F | Resp 18 | Wt 133.0 lb

## 2021-03-20 DIAGNOSIS — I2510 Atherosclerotic heart disease of native coronary artery without angina pectoris: Secondary | ICD-10-CM | POA: Insufficient documentation

## 2021-03-20 DIAGNOSIS — K7460 Unspecified cirrhosis of liver: Secondary | ICD-10-CM | POA: Insufficient documentation

## 2021-03-20 DIAGNOSIS — D696 Thrombocytopenia, unspecified: Secondary | ICD-10-CM | POA: Insufficient documentation

## 2021-03-20 DIAGNOSIS — E039 Hypothyroidism, unspecified: Secondary | ICD-10-CM | POA: Diagnosis not present

## 2021-03-20 DIAGNOSIS — Z79899 Other long term (current) drug therapy: Secondary | ICD-10-CM | POA: Diagnosis not present

## 2021-03-20 DIAGNOSIS — I10 Essential (primary) hypertension: Secondary | ICD-10-CM | POA: Insufficient documentation

## 2021-03-20 DIAGNOSIS — E785 Hyperlipidemia, unspecified: Secondary | ICD-10-CM | POA: Insufficient documentation

## 2021-03-20 DIAGNOSIS — E119 Type 2 diabetes mellitus without complications: Secondary | ICD-10-CM | POA: Diagnosis not present

## 2021-03-20 DIAGNOSIS — K7581 Nonalcoholic steatohepatitis (NASH): Secondary | ICD-10-CM | POA: Diagnosis not present

## 2021-03-20 DIAGNOSIS — D071 Carcinoma in situ of vulva: Secondary | ICD-10-CM | POA: Insufficient documentation

## 2021-03-20 DIAGNOSIS — I252 Old myocardial infarction: Secondary | ICD-10-CM | POA: Insufficient documentation

## 2021-03-20 DIAGNOSIS — I4891 Unspecified atrial fibrillation: Secondary | ICD-10-CM | POA: Diagnosis not present

## 2021-03-20 LAB — PROTEIN ELECTROPHORESIS, SERUM
A/G Ratio: 1.3 (ref 0.7–1.7)
Albumin ELP: 3.5 g/dL (ref 2.9–4.4)
Alpha-1-Globulin: 0.3 g/dL (ref 0.0–0.4)
Alpha-2-Globulin: 0.8 g/dL (ref 0.4–1.0)
Beta Globulin: 1 g/dL (ref 0.7–1.3)
Gamma Globulin: 0.7 g/dL (ref 0.4–1.8)
Globulin, Total: 2.7 g/dL (ref 2.2–3.9)
Total Protein ELP: 6.2 g/dL (ref 6.0–8.5)

## 2021-03-20 MED ORDER — SENNOSIDES-DOCUSATE SODIUM 8.6-50 MG PO TABS: 2.0000 | ORAL_TABLET | Freq: Every day | ORAL | 0 refills | Status: DC

## 2021-03-20 MED ORDER — TRAMADOL HCL 50 MG PO TABS: 50.0000 mg | ORAL_TABLET | Freq: Four times a day (QID) | ORAL | 0 refills | Status: DC | PRN

## 2021-03-20 NOTE — Progress Notes (Signed)
Consult Note: Gyn-Onc  Consult was requested by Dr. Elonda Husky for the evaluation of Tasha Roberts 80 y.o. Roberts  CC:  Chief Complaint  Patient presents with  . VIN 3    Assessment/Plan:  Tasha Roberts  is a 80 y.o.  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   Given her high risk for surgical bleeding and her recent history of hemorrhage associated with the punch biopsy, and given that some of this bleeding risk is associated with nonreversible thrombocytopenia, I am recommending CO2 laser to the lesion rather than excision.  I explained that the limitations of this would be that we would not have pathology for evaluation to rule out malignancy or adequacy of depth of excision and margin status.  However CO2 laser is been associated with similar long-term outcomes as excision for VIN lesions.  I am recommending CO2 laser to the right vulva.  I explained the procedure and its inherent risks.  I am recommending that she hold her Eliquis for 3 days preoperatively (she has renal and hepatic disease with impaired clearance of Eliquis) and recommend she continue to hold the Eliquis for 7 days postoperatively.  Topical steroids could be considered for long-term management of her vulvar dystrophy after she has healed from her surgical procedure.   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  The patient has a longstanding history of vulvar pruritus.  This is being managed topically with Derrek Monaco with modest resolution of symptoms.  However the patient noticed increased itch symptoms in March 2022 and return to see Anderson Malta and Dr. Elonda Husky for further evaluation.  At that time, on 02/11/2021, a white plaque was  identified on the right anterior labia minora, consistent with dysplasia.  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  Following the biopsy the patient had significant bleeding that required suture placement.  Approximately 1 week following the procedure she developed hemorrhage from the vulvar lesion where it had been biopsied and was seen in the hospital emergency room and observed overnight.  Hemoglobin was 8, and she did not require a transfusion for her anemia.  Her Eliquis was held at that time and had not been restarted. 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  Her surgical history is most significant for breast surgery and cholecystectomy.  Her gynecologic history is remarkable for vulvar dystrophy.  Her family cancer history is unremarkable.   Current Meds:  Outpatient Encounter Medications as of 03/20/2021  Medication Sig  . atorvastatin (LIPITOR) 20 MG tablet Take 20 mg by mouth daily.  . Cholecalciferol 50 MCG (2000 UT) CAPS Take 2,000 mg by mouth daily.  Marland Kitchen diltiazem (CARDIZEM CD) 120 MG 24 hr capsule Take 1 capsule (120 mg total) by mouth daily.  Marland Kitchen levothyroxine (SYNTHROID) 100 MCG tablet Take 150 mcg by mouth daily before breakfast.  . nitroGLYCERIN (NITROSTAT) 0.4 MG SL tablet Place 0.4  mg under the tongue every 5 (five) minutes as needed for chest pain.   No facility-administered encounter medications on file as of 03/20/2021.    Allergy:  Allergies  Allergen Reactions  . Dicyclomine Other (See Comments)  . Metformin And Related   . Losartan Other (See Comments)    Heartburn     Social Hx:    Social History   Socioeconomic History  . Marital status: Married    Spouse name: Not on file  . Number of children: 3  . Years of education: Not on file  . Highest education level: Not on file  Occupational History  . Occupation: retired  Tobacco Use  . Smoking status: Never Smoker  . Smokeless tobacco: Never Used  Vaping Use  . Vaping Use: Never used  Substance and Sexual Activity  . Alcohol use: Not Currently    Comment: has not drank alcohol in 20-30 years  . Drug use: Never  . Sexual activity: Not Currently    Birth control/protection: Post-menopausal  Other Topics Concern  . Not on file  Social History Narrative  . Not on file   Social Determinants of Health   Financial Resource Strain: Low Risk   . Difficulty of Paying Living Expenses: Not hard at all  Food Insecurity: No Food Insecurity  . Worried About Charity fundraiser in the Last Year: Never true  . Ran Out of Food in the Last Year: Never true  Transportation Needs: No Transportation Needs  . Lack of Transportation (Medical): No  . Lack of Transportation (Non-Medical): No  Physical Activity: Inactive  . Days of Exercise per Week: 0 days  . Minutes of Exercise per Session: 0 min  Stress: Stress Concern Present  . Feeling of Stress : To some extent  Social Connections: Moderately Integrated  . Frequency of Communication with Friends and Family: More than three times a week  . Frequency of Social Gatherings with Friends and Family: More than three times a week  . Attends Religious Services: More than 4 times per year  . Active Member of Clubs or Organizations: No  . Attends Archivist Meetings: Never  . Marital Status: Married  Human resources officer Violence: Not At Risk  . Fear of Current or Ex-Partner: No  . Emotionally Abused: No  . Physically Abused: No  . Sexually Abused: No    Past Surgical Hx:  Past Surgical History:  Procedure Laterality Date  . ABDOMINAL HYSTERECTOMY     partial  .  BACK SURGERY    . BREAST LUMPECTOMY Left 2013  . BREAST SURGERY    . CHOLECYSTECTOMY      Past Medical Hx:  Past Medical History:  Diagnosis Date  . Atrial fibrillation ([place])   . Breast cancer ([place]) 2013   left  . CAD (coronary artery disease)   . Cirrhosis ([place])   . Diabetes mellitus ([place])   . Hemorrhage   . Hyperlipidemia   . Hypertension   . Hypothyroidism   . NASH (nonalcoholic steatohepatitis)   . Personal history of radiation therapy 2013   left breast  . Sarcoidosis 1965    Past Gynecological History: Vulvar dystrophy and pruritus No LMP recorded. Patient is postmenopausal.  Family Hx:  Family History  Problem Relation Age of Onset  . Hypertension Mother   . Dementia Mother   . Heart disease Father   . Heart disease Brother     Review of Systems:  Constitutional  Feels well,  ENT Normal appearing ears and nares bilaterally Skin/Breast  + Easy bruising Cardiovascular  No chest pain, shortness of breath, or edema  Pulmonary  No cough or wheeze.  Gastro Intestinal  No nausea, vomitting, or diarrhoea. No bright red blood per rectum, no abdominal pain, change in bowel movement, or constipation.  Genito Urinary  No frequency, urgency, dysuria, + pruritis Musculo Skeletal  No myalgia, arthralgia, joint swelling or pain  Neurologic  No weakness, numbness, change in gait,  Psychology  No depression, anxiety, insomnia.   Vitals:  Blood pressure (!) 119/54, pulse 68, temperature (!) 97.5 F (36.4 C), temperature source Tympanic, resp. rate 18, weight 133 lb (60.3 kg), SpO2 100 %.  Physical Exam: WD in NAD Neck  Supple NROM, without any enlargements.  Lymph Node Survey No cervical supraclavicular or inguinal adenopathy Cardiovascular  Warm well perfused peripheries, irregular heart rate Lungs  No increased WOB Skin  Wide spread echymoses Psychiatry  Alert and oriented to person, place, and time  Abdomen  Normoactive bowel sounds, abdomen soft,  non-tender and nonobese without evidence of hernia.  Back No CVA tenderness Genito Urinary  Vulva/vagina:  Acetic acid applied to vulva. A 2-3cm lesion (well circumscribed) of macular white changes were seen on the right anterior labia minora (>2cm for urethral meatus). Broad erythema, loss of normal skin folds and hair and excoriations were seen on the labia majora and labiocrural folds.  Rectal  deferred Extremities  No bilateral cyanosis, clubbing or edema.  60 minutes of total time was spent for this patient encounter, including preparation, face-to-face counseling with the patient and coordination of care, review of imaging (results and images), communication with the referring provider and documentation of the encounter.   Thereasa Solo, MD  03/20/2021, 12:00 PM

## 2021-03-20 NOTE — Patient Instructions (Signed)
Preparing for your Surgery  Plan for surgery on April 04, 2021 with Dr. Everitt Amber at [facility]. You will be scheduled for a laser application to the vulva.   Pre-operative Testing -You will receive a phone call from presurgical testing at [facility] to discuss pre-operative instructions, labs if needed, and arrange for a COVID test. The COVID test normally happens 3 days prior to the surgery and they ask that you self quarantine after the test up until surgery to decrease chance of exposure.  -Bring your insurance card, copy of an advanced directive if applicable, medication list  -You will need to stop taking your ELIQUIS three days before the procedure (Last dose on 03/31/2021)  and do not take it 7 days AFTER the procedure (may resume on Apr 12, 2021).  Day Before Surgery at [place] will be advised you can have clear liquids up until 3 hours before your surgery.    Your role in recovery Your role is to become active as soon as directed by your doctor, while still giving yourself time to heal.  Rest when you feel tired. You will be asked to do the following in order to speed your recovery:  - Cough and breathe deeply. This helps to clear and expand your lungs and can prevent pneumonia after surgery.  - [place]. Do mild physical activity. Walking or moving your legs help your circulation and body functions return to normal. Do not try to get up or walk alone the first time after surgery.   -If you develop swelling on one leg or the other, pain in the back of your leg, redness/warmth in one of your legs, please call the office or go to the Emergency Room to have a doppler to rule out a blood clot. For shortness of breath, chest pain-seek care in the Emergency Room as soon as possible. - Actively manage your pain. Managing your pain lets you move in comfort. We will ask you to rate your pain on a scale of zero to 10. It is your  responsibility to tell your doctor or nurse where and how much you hurt so your pain can be treated.  Pain Management After Surgery -You have been prescribed your pain medication and bowel regimen medications before surgery so that you can have these available when you are discharged from the hospital. The pain medication is for use ONLY AFTER surgery and a new prescription will not be given.   -Make sure that you have Tylenol at home to use on a regular basis after surgery for pain control.   -Review the attached handout on narcotic use and their risks and side effects.   Bowel Regimen -You have been prescribed Sennakot-S to take nightly to prevent constipation especially if you are taking the narcotic pain medication intermittently.  It is important to prevent constipation and drink adequate amounts of liquids. You can stop taking this medication when you are not taking pain medication and you are back on your normal bowel routine.  Risks of Surgery Risks of surgery are low but include bleeding, infection, damage to surrounding structures, re-operation, blood clots, and very rarely death.  AFTER SURGERY INSTRUCTIONS  We recommend purchasing several bags of frozen green peas and dividing them into ziploc bags. You will want to keep these in the freezer and have them ready to use as ice packs to the vulvar incision. Once the ice pack is no longer  cold, you can get another from the freezer. The frozen peas mold to your body better than a regular ice pack.  1. No driving for minimum of 24 hours if you were cleared to drive before surgery.  Do not drive if you are taking narcotic pain medicine and make sure that your reaction time has returned.   2. No sexual activity and nothing in the vagina for 3-4 weeks.  3. You may experience drainage from the laser site, which is normal.  If the drainage persists, increases, or changes color please call the office.  4. Take Tylenol first for pain and only  use narcotic pain medication for severe pain not relieved by the Tylenol.  Monitor your Tylenol intake to a max of 4,000 mg in a 24 hour period. You can alternate these medications after surgery.  Wound Care: 1. Keep clean and dry.  Shower daily.  Reasons to call the Doctor:  Fever - Oral temperature greater than 100.4 degrees Fahrenheit  Foul-smelling vaginal discharge  Difficulty urinating  Nausea and vomiting  Increased pain at the site of the incision that is unrelieved with pain medicine.  Difficulty breathing with or without chest pain  New calf pain especially if only on one side  Sudden, continuing increased vaginal bleeding with or without clots.   Contacts: For questions or concerns you should contact:  Dr. Everitt Amber at [phone]  Joylene John, NP at [phone]  After Hours: call [phone] and have the GYN Oncologist paged/contacted (after 5 pm or on the weekends).  Messages sent via mychart are for non-urgent matters and are not responded to after hours so for urgent needs, please call the after hours number.  Post Operative Instructions Following Laser Surgery  Laser treatment of vulvar dysplasia is used to vaporize or eliminate the pre-cancerous changes.  The laser actually creates a burn effect on the skin to accomplish this.  The following instructions will help in you comfort postoperatively:  First 24 h  Sit in a tub or sitz bath of COOL water for 20 minutes 1-3 times a day.  After the bath, carefully blot the area dry and place Silvadene over vulva.  You may sit on a covered ice pack between the baths as needed or on a circular pillow.   If you become uncomfortable, call the office for instructions.   Take your pain medications as prescribed  You may eat whatever you feel like.  Start with a light meal and gradually advance your diet.    Beginning the day after your surgery  Sit in a tub of cool to warm water for 15 minutes at least 3  times a day and after bowel movements  After the bowel movement, clean and dry the area.  Apply Silvadene to the vulva after your baths.    Drainage is usually a pink to tan color and is normal for the following 2-3 weeks after surgery. You can use a peripad to help collect any drainage.  Diet  Eat a regular diet.  Avoid foods that may constipate you or give you diarrhea.  Avoid foods with seeds, nuts, corn or popcorn.  Beginning the day after surgery, drink 6-8 glasses of water a day in addition to your meals.  Limit you caffeine intake to 1-2 servings per day.  Medication  Take a fiber supplement (Metamucil, Citrucel, FiberCon) twice a day.  Take a stool softener like Colace twice daily or you can use the sennakot-S prescribed for you  Take  your pain medication as directed.  You may use Extra Strength Tylenol instead of your prescribed pain medication to relieve mild discomfort.  Avoid products containing aspirin.  Bowel Habits  You should have a bowel movement at least every other day.  If you are constipated, you may take a Fleet enema or 2 Dulcolax tablets.  Call the office if no results occur.  You may bear down a normal amount to have a bowel movement without hurting your tissues after the operation.  Activity  Walking is encouraged.  You may drive when you are no longer on prescription pain medication.  You may go up and down stairs carefully.  No heavy lifting or strenuous activity until after your first post operative appointment  Do NOT sit on a rubber ring; instead use a soft pillow if needed.  Call the office if you have any questions or concerns.  Call IMMEDIATELY if you develop:  Fever greater than 100 F  Difficulty urinating

## 2021-03-20 NOTE — H&P (View-Only) (Signed)
Consult Note: Gyn-Onc  Consult was requested by Dr. Elonda Husky for the evaluation of Tasha Roberts 80 y.o. female  CC:  Chief Complaint  Patient presents with  . VIN 3    Assessment/Plan:  [name]  is a 81 y.o.  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   Given her high risk for surgical bleeding and her recent history of hemorrhage associated with the punch biopsy, and given that some of this bleeding risk is associated with nonreversible thrombocytopenia, I am recommending CO2 laser to the lesion rather than excision.  I explained that the limitations of this would be that we would not have pathology for evaluation to rule out malignancy or adequacy of depth of excision and margin status.  However CO2 laser is been associated with similar long-term outcomes as excision for VIN lesions.  I am recommending CO2 laser to the right vulva.  I explained the procedure and its inherent risks.  I am recommending that she hold her Eliquis for 3 days preoperatively (she has renal and hepatic disease with impaired clearance of Eliquis) and recommend she continue to hold the Eliquis for 7 days postoperatively.  Topical steroids could be considered for long-term management of her vulvar dystrophy after she has healed from her surgical procedure.   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  The patient has a longstanding history of vulvar pruritus.  This is being managed topically with Derrek Monaco with modest resolution of symptoms.  However the patient noticed increased itch symptoms in March 2022 and return to see Anderson Malta and Dr. Elonda Husky for further evaluation.  At that time, on 02/11/2021, a white plaque was  identified on the right anterior labia minora, consistent with dysplasia.  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  Following the biopsy the patient had significant bleeding that required suture placement.  Approximately 1 week following the procedure she developed hemorrhage from the vulvar lesion where it had been biopsied and was seen in the hospital emergency room and observed overnight.  Hemoglobin was 8, and she did not require a transfusion for her anemia.  Her Eliquis was held at that time and had not been restarted. 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  Her surgical history is most significant for breast surgery and cholecystectomy.  Her gynecologic history is remarkable for vulvar dystrophy.  Her family cancer history is unremarkable.   Current Meds:  Outpatient Encounter Medications as of 03/20/2021  Medication Sig  . atorvastatin (LIPITOR) 20 MG tablet Take 20 mg by mouth daily.  . Cholecalciferol 50 MCG (2000 UT) CAPS Take 2,000 mg by mouth daily.  Marland Kitchen diltiazem (CARDIZEM CD) 120 MG 24 hr capsule Take 1 capsule (120 mg total) by mouth daily.  Marland Kitchen levothyroxine (SYNTHROID) 100 MCG tablet Take 150 mcg by mouth daily before breakfast.  . nitroGLYCERIN (NITROSTAT) 0.4 MG SL tablet Place 0.4  mg under the tongue every 5 (five) minutes as needed for chest pain.   No facility-administered encounter medications on file as of 03/20/2021.    Allergy:  Allergies  Allergen Reactions  . Dicyclomine Other (See Comments)  . Metformin And Related   . Losartan Other (See Comments)    Heartburn     Social Hx:    Social History   Socioeconomic History  . Marital status: Married    Spouse name: Not on file  . Number of children: 3  . Years of education: Not on file  . Highest education level: Not on file  Occupational History  . Occupation: retired  Tobacco Use  . Smoking status: Never Smoker  . Smokeless tobacco: Never Used  Vaping Use  . Vaping Use: Never used  Substance and Sexual Activity  . Alcohol use: Not Currently    Comment: has not drank alcohol in 20-30 years  . Drug use: Never  . Sexual activity: Not Currently    Birth control/protection: Post-menopausal  Other Topics Concern  . Not on file  Social History Narrative  . Not on file   Social Determinants of Health   Financial Resource Strain: Low Risk   . Difficulty of Paying Living Expenses: Not hard at all  Food Insecurity: No Food Insecurity  . Worried About Charity fundraiser in the Last Year: Never true  . Ran Out of Food in the Last Year: Never true  Transportation Needs: No Transportation Needs  . Lack of Transportation (Medical): No  . Lack of Transportation (Non-Medical): No  Physical Activity: Inactive  . Days of Exercise per Week: 0 days  . Minutes of Exercise per Session: 0 min  Stress: Stress Concern Present  . Feeling of Stress : To some extent  Social Connections: Moderately Integrated  . Frequency of Communication with Friends and Family: More than three times a week  . Frequency of Social Gatherings with Friends and Family: More than three times a week  . Attends Religious Services: More than 4 times per year  . Active Member of Clubs or Organizations: No  . Attends Archivist Meetings: Never  . Marital Status: Married  Human resources officer Violence: Not At Risk  . Fear of Current or Ex-Partner: No  . Emotionally Abused: No  . Physically Abused: No  . Sexually Abused: No    Past Surgical Hx:  Past Surgical History:  Procedure Laterality Date  . ABDOMINAL HYSTERECTOMY     partial  .  BACK SURGERY    . BREAST LUMPECTOMY Left 2013  . BREAST SURGERY    . CHOLECYSTECTOMY      Past Medical Hx:  Past Medical History:  Diagnosis Date  . Atrial fibrillation ([place])   . Breast cancer ([place]) 2013   left  . CAD (coronary artery disease)   . Cirrhosis ([place])   . Diabetes mellitus ([place])   . Hemorrhage   . Hyperlipidemia   . Hypertension   . Hypothyroidism   . NASH (nonalcoholic steatohepatitis)   . Personal history of radiation therapy 2013   left breast  . Sarcoidosis 1965    Past Gynecological History: Vulvar dystrophy and pruritus No LMP recorded. Patient is postmenopausal.  Family Hx:  Family History  Problem Relation Age of Onset  . Hypertension Mother   . Dementia Mother   . Heart disease Father   . Heart disease Brother     Review of Systems:  Constitutional  Feels well,  ENT Normal appearing ears and nares bilaterally Skin/Breast  + Easy bruising Cardiovascular  No chest pain, shortness of breath, or edema  Pulmonary  No cough or wheeze.  Gastro Intestinal  No nausea, vomitting, or diarrhoea. No bright red blood per rectum, no abdominal pain, change in bowel movement, or constipation.  Genito Urinary  No frequency, urgency, dysuria, + pruritis Musculo Skeletal  No myalgia, arthralgia, joint swelling or pain  Neurologic  No weakness, numbness, change in gait,  Psychology  No depression, anxiety, insomnia.   Vitals:  Blood pressure (!) 119/54, pulse 68, temperature (!) 97.5 F (36.4 C), temperature source Tympanic, resp. rate 18, weight 133 lb (60.3 kg), SpO2 100 %.  Physical Exam: WD in NAD Neck  Supple NROM, without any enlargements.  Lymph Node Survey No cervical supraclavicular or inguinal adenopathy Cardiovascular  Warm well perfused peripheries, irregular heart rate Lungs  No increased WOB Skin  Wide spread echymoses Psychiatry  Alert and oriented to person, place, and time  Abdomen  Normoactive bowel sounds, abdomen soft,  non-tender and nonobese without evidence of hernia.  Back No CVA tenderness Genito Urinary  Vulva/vagina:  Acetic acid applied to vulva. A 2-3cm lesion (well circumscribed) of macular white changes were seen on the right anterior labia minora (>2cm for urethral meatus). Broad erythema, loss of normal skin folds and hair and excoriations were seen on the labia majora and labiocrural folds.  Rectal  deferred Extremities  No bilateral cyanosis, clubbing or edema.  60 minutes of total time was spent for this patient encounter, including preparation, face-to-face counseling with the patient and coordination of care, review of imaging (results and images), communication with the referring provider and documentation of the encounter.   Thereasa Solo, MD  03/20/2021, 12:00 PM

## 2021-03-25 ENCOUNTER — Other Ambulatory Visit: Payer: Self-pay

## 2021-03-25 ENCOUNTER — Inpatient Hospital Stay (HOSPITAL_COMMUNITY): Payer: Medicare Other

## 2021-03-25 VITALS — BP 116/35 | HR 53 | Temp 96.8°F | Resp 18

## 2021-03-25 DIAGNOSIS — D500 Iron deficiency anemia secondary to blood loss (chronic): Secondary | ICD-10-CM

## 2021-03-25 DIAGNOSIS — D696 Thrombocytopenia, unspecified: Secondary | ICD-10-CM | POA: Diagnosis not present

## 2021-03-25 MED ORDER — SODIUM CHLORIDE 0.9 % IV SOLN
510.0000 mg | Freq: Once | INTRAVENOUS | Status: AC
  Administered 2021-03-25: 510 mg via INTRAVENOUS
  Filled 2021-03-25: qty 510

## 2021-03-25 MED ORDER — DIPHENHYDRAMINE HCL 25 MG PO CAPS
25.0000 mg | ORAL_CAPSULE | Freq: Once | ORAL | Status: AC
Start: 2021-03-25 — End: 2021-03-25
  Administered 2021-03-25: 25 mg via ORAL

## 2021-03-25 MED ORDER — ACETAMINOPHEN 325 MG PO TABS
ORAL_TABLET | ORAL | Status: AC
  Filled 2021-03-25: qty 2

## 2021-03-25 MED ORDER — DIPHENHYDRAMINE HCL 25 MG PO CAPS
ORAL_CAPSULE | ORAL | Status: AC
  Filled 2021-03-25: qty 1

## 2021-03-25 MED ORDER — ACETAMINOPHEN 325 MG PO TABS
650.0000 mg | ORAL_TABLET | Freq: Once | ORAL | Status: AC
  Administered 2021-03-25: 650 mg via ORAL

## 2021-03-25 MED ORDER — SODIUM CHLORIDE 0.9 % IV SOLN: Freq: Once | INTRAVENOUS | Status: AC

## 2021-03-25 NOTE — Patient Instructions (Signed)
Ferumoxytol infusion What is this medicine? FERUMOXYTOL is an iron complex. Iron is used to make healthy red blood cells, which carry oxygen and nutrients throughout the body. This medicine is used to treat iron deficiency anemia. This medicine may be used for other purposes; ask your health care provider or pharmacist if you have questions. COMMON BRAND NAME(S): Feraheme What should I tell my health care provider before I take this medicine? They need to know if you have any of these conditions:  anemia not caused by low iron levels  high levels of iron in the blood  magnetic resonance imaging (MRI) test scheduled  an unusual or allergic reaction to iron, other medicines, foods, dyes, or preservatives  pregnant or trying to get pregnant  breast-feeding How should I use this medicine? This medicine is for injection into a vein. It is given by a health care professional in a hospital or clinic setting. Talk to your pediatrician regarding the use of this medicine in children. Special care may be needed. Overdosage: If you think you have taken too much of this medicine contact a poison control center or emergency room at once. NOTE: This medicine is only for you. Do not share this medicine with others. What if I miss a dose? It is important not to miss your dose. Call your doctor or health care professional if you are unable to keep an appointment. What may interact with this medicine? This medicine may interact with the following medications:  other iron products This list may not describe all possible interactions. Give your health care provider a list of all the medicines, herbs, non-prescription drugs, or dietary supplements you use. Also tell them if you smoke, drink alcohol, or use illegal drugs. Some items may interact with your medicine. What should I watch for while using this medicine? Visit your doctor or healthcare professional regularly. Tell your doctor or healthcare  professional if your symptoms do not start to get better or if they get worse. You may need blood work done while you are taking this medicine. You may need to follow a special diet. Talk to your doctor. Foods that contain iron include: whole grains/cereals, dried fruits, beans, or peas, leafy green vegetables, and organ meats (liver, kidney). What side effects may I notice from receiving this medicine? Side effects that you should report to your doctor or health care professional as soon as possible:  allergic reactions like skin rash, itching or hives, swelling of the face, lips, or tongue  breathing problems  changes in blood pressure  feeling faint or lightheaded, falls  fever or chills  flushing, sweating, or hot feelings  swelling of the ankles or feet Side effects that usually do not require medical attention (report to your doctor or health care professional if they continue or are bothersome):  diarrhea  headache  nausea, vomiting  stomach pain This list may not describe all possible side effects. Call your doctor for medical advice about side effects. You may report side effects to FDA at 1-800-FDA-1088. Where should I keep my medicine? This drug is given in a hospital or clinic and will not be stored at home. NOTE: This sheet is a summary. It may not cover all possible information. If you have questions about this medicine, talk to your doctor, pharmacist, or health care provider.  2021 Elsevier/Gold Standard (2017-01-12 20:21:10)

## 2021-03-25 NOTE — Progress Notes (Signed)
Patient presents today for Feraheme infusion.  Vital signs WNL.  Patient has no new complaints at this time.  Peripheral IV started and Blood return noted prior to and post infusion.    Feraheme infusion given today per MD orders.  Stable during infusion without adverse affects.  Vital signs stable.  No complaints at this time.  Discharge from clinic ambulatory in stable condition.  Alert and oriented X 3.  Follow up with [facility] as scheduled.

## 2021-03-26 LAB — METHYLMALONIC ACID, SERUM: Methylmalonic Acid, Quantitative: 298 nmol/L (ref 0–378)

## 2021-03-29 ENCOUNTER — Ambulatory Visit
Admission: RE | Admit: 2021-03-29 | Discharge: 2021-03-29 | Disposition: A | Discharge status: Home / Self Care | Payer: Self-pay | Source: Ambulatory Visit | Attending: Physician Assistant | Admitting: Physician Assistant

## 2021-03-29 ENCOUNTER — Inpatient Hospital Stay
Admission: RE | Admit: 2021-03-29 | Discharge: 2021-03-29 | Disposition: A | Discharge status: Home / Self Care | Payer: Self-pay | Source: Ambulatory Visit | Attending: Physician Assistant | Admitting: Physician Assistant

## 2021-03-29 ENCOUNTER — Other Ambulatory Visit (HOSPITAL_COMMUNITY): Payer: Self-pay | Admitting: Physician Assistant

## 2021-03-29 DIAGNOSIS — Z853 Personal history of malignant neoplasm of breast: Secondary | ICD-10-CM

## 2021-04-01 ENCOUNTER — Encounter (HOSPITAL_BASED_OUTPATIENT_CLINIC_OR_DEPARTMENT_OTHER): Payer: Self-pay | Admitting: Gynecologic Oncology

## 2021-04-01 ENCOUNTER — Other Ambulatory Visit: Payer: Self-pay

## 2021-04-01 NOTE — Progress Notes (Addendum)
ADDENDUM:  Chart reviewed by anesthesia, Dr Conrad Mitchell MDA, ok to proceed.   Spoke w/ via phone for pre-op interview---PT Lab needs dos---- no              Lab results------ pt getting CBC/ CMP done 04-02-2021;  Current ekg in epic/ chart COVID test ------ 04-02-2021 @ 1245  Arrive at ------- 0815 on 04-04-2021 NPO after MN NO Solid Food.  Clear liquids from MN until--- 0715 Med rec completed Medications to take morning of surgery ----- Lipitor, Cardizem, Synthroid Diabetic medication ----- n/a  Patient instructed to bring photo id and insurance card day of surgery Patient aware to have Driver (ride ) / caregiver    for 24 hours after surgery-- daughter, Duaine Dredge  Patient Special Instructions ----- n/a Pre-Op special Istructions ----- n/a Patient verbalized understanding of instructions that were given at this phone interview. Patient denies shortness of breath, chest pain, fever, cough at this phone interview.   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  She does have new cardiologist appointment 04-30-2021 w/ Dr Harl Bowie.  Pt stated occasional palpitations mostly at night / rest,  No peripheral swelling, only doe with stairs otherwise no sob.   PCP:  Dr Stoney Bang (per pt lov approx. Sept 2021 Cardiologist :  Suann Larry PA , [facility] in [place], [place] 01-24-2020 care everywhere)  Pulmonology:  Dr Halford Chessman [facility] 11-15-2020 epic) Hepatic Clinic:  Dr Merrilee Jansky [facility] 04-30-2020 care everywhere) Hematology/ oncology: Dr Delton Coombes [facility] 03-04-2021 epic)  Chest x-ray : 10-02-2020 epic EKG : 10-01-2020 epic Echo : 12-14-2019  Care everywhere Stress test:  08-17-2019 care everywhere Cardiac Cath :  no Activity level:  Doe w/ stairs Sleep Study/ CPAP :  NO  Blood Thinner/ Instructions Maryjane Hurter Dose: Eliquis ASA /  Instructions/ Last Dose : NO Per pt told by Dr Denman George to stop eliquis prior to surgery , stated last dose am 03-31-2021

## 2021-04-02 ENCOUNTER — Telehealth: Payer: Self-pay

## 2021-04-02 ENCOUNTER — Other Ambulatory Visit: Payer: Self-pay

## 2021-04-02 ENCOUNTER — Encounter (HOSPITAL_COMMUNITY)
Admission: RE | Admit: 2021-04-02 | Discharge: 2021-04-02 | Disposition: A | Discharge status: Home / Self Care | Payer: Medicare Other | Source: Ambulatory Visit | Attending: Gynecologic Oncology | Admitting: Gynecologic Oncology

## 2021-04-02 ENCOUNTER — Other Ambulatory Visit (HOSPITAL_COMMUNITY)
Admission: RE | Admit: 2021-04-02 | Discharge: 2021-04-02 | Disposition: A | Discharge status: Home / Self Care | Payer: Medicare Other | Source: Ambulatory Visit | Attending: Gynecologic Oncology | Admitting: Gynecologic Oncology

## 2021-04-02 DIAGNOSIS — Z01812 Encounter for preprocedural laboratory examination: Secondary | ICD-10-CM | POA: Insufficient documentation

## 2021-04-02 DIAGNOSIS — Z20822 Contact with and (suspected) exposure to covid-19: Secondary | ICD-10-CM | POA: Insufficient documentation

## 2021-04-02 LAB — COMPREHENSIVE METABOLIC PANEL
ALT: 33 U/L (ref 0–44)
AST: 39 U/L (ref 15–41)
Albumin: 3.8 g/dL (ref 3.5–5.0)
Alkaline Phosphatase: 72 U/L (ref 38–126)
Anion gap: 10 (ref 5–15)
BUN: 25 mg/dL — ABNORMAL HIGH (ref 8–23)
CO2: 26 mmol/L (ref 22–32)
Calcium: 9.4 mg/dL (ref 8.9–10.3)
Chloride: 111 mmol/L (ref 98–111)
Creatinine, Ser: 0.99 mg/dL (ref 0.44–1.00)
GFR, Estimated: 59 mL/min — ABNORMAL LOW (ref >60)
Glucose, Bld: 115 mg/dL — ABNORMAL HIGH (ref 70–99)
Potassium: 4 mmol/L (ref 3.5–5.1)
Sodium: 147 mmol/L — ABNORMAL HIGH (ref 135–145)
Total Bilirubin: 0.6 mg/dL (ref 0.3–1.2)
Total Protein: 6.7 g/dL (ref 6.5–8.1)

## 2021-04-02 LAB — CBC
HCT: 35.8 % — ABNORMAL LOW (ref 36.0–46.0)
Hemoglobin: 11.9 g/dL — ABNORMAL LOW (ref 12.0–15.0)
MCH: 31.1 pg (ref 26.0–34.0)
MCHC: 33.2 g/dL (ref 30.0–36.0)
MCV: 93.5 fL (ref 80.0–100.0)
Platelets: 116 10*3/uL — ABNORMAL LOW (ref 150–400)
RBC: 3.83 MIL/uL — ABNORMAL LOW (ref 3.87–5.11)
RDW: 14.4 % (ref 11.5–15.5)
WBC: 4.4 10*3/uL (ref 4.0–10.5)
nRBC: 0 % (ref 0.0–0.2)

## 2021-04-02 NOTE — Telephone Encounter (Signed)
Ms Lafond has held Eliquis after am dose on 03-31-21. She knows to resume it 7 days after her surgery = 04-12-21. She stated that she understands instructions given to her yesterday by Jeral Fruit RN.

## 2021-04-03 ENCOUNTER — Encounter (HOSPITAL_BASED_OUTPATIENT_CLINIC_OR_DEPARTMENT_OTHER): Payer: Self-pay | Admitting: Gynecologic Oncology

## 2021-04-03 LAB — SARS CORONAVIRUS 2 (TAT 6-24 HRS): SARS Coronavirus 2: NEGATIVE

## 2021-04-04 ENCOUNTER — Ambulatory Visit (HOSPITAL_BASED_OUTPATIENT_CLINIC_OR_DEPARTMENT_OTHER): Payer: Medicare Other | Admitting: Anesthesiology

## 2021-04-04 ENCOUNTER — Ambulatory Visit (HOSPITAL_BASED_OUTPATIENT_CLINIC_OR_DEPARTMENT_OTHER)
Admission: RE | Admit: 2021-04-04 | Discharge: 2021-04-04 | Disposition: A | Discharge status: Home / Self Care | Payer: Medicare Other | Attending: Gynecologic Oncology | Admitting: Gynecologic Oncology

## 2021-04-04 ENCOUNTER — Encounter (HOSPITAL_BASED_OUTPATIENT_CLINIC_OR_DEPARTMENT_OTHER): Payer: Self-pay | Admitting: Gynecologic Oncology

## 2021-04-04 ENCOUNTER — Encounter (HOSPITAL_BASED_OUTPATIENT_CLINIC_OR_DEPARTMENT_OTHER)
Admission: RE | Disposition: A | Discharge status: Home / Self Care | Payer: Self-pay | Source: Home / Self Care | Attending: Gynecologic Oncology

## 2021-04-04 DIAGNOSIS — I25119 Atherosclerotic heart disease of native coronary artery with unspecified angina pectoris: Secondary | ICD-10-CM | POA: Diagnosis not present

## 2021-04-04 DIAGNOSIS — Z853 Personal history of malignant neoplasm of breast: Secondary | ICD-10-CM | POA: Diagnosis not present

## 2021-04-04 DIAGNOSIS — E785 Hyperlipidemia, unspecified: Secondary | ICD-10-CM | POA: Insufficient documentation

## 2021-04-04 DIAGNOSIS — K7469 Other cirrhosis of liver: Secondary | ICD-10-CM | POA: Diagnosis not present

## 2021-04-04 DIAGNOSIS — I4891 Unspecified atrial fibrillation: Secondary | ICD-10-CM | POA: Insufficient documentation

## 2021-04-04 DIAGNOSIS — D696 Thrombocytopenia, unspecified: Secondary | ICD-10-CM | POA: Insufficient documentation

## 2021-04-04 DIAGNOSIS — Z79899 Other long term (current) drug therapy: Secondary | ICD-10-CM | POA: Diagnosis not present

## 2021-04-04 DIAGNOSIS — Z923 Personal history of irradiation: Secondary | ICD-10-CM | POA: Insufficient documentation

## 2021-04-04 DIAGNOSIS — Z888 Allergy status to other drugs, medicaments and biological substances status: Secondary | ICD-10-CM | POA: Diagnosis not present

## 2021-04-04 DIAGNOSIS — E039 Hypothyroidism, unspecified: Secondary | ICD-10-CM | POA: Insufficient documentation

## 2021-04-04 DIAGNOSIS — D071 Carcinoma in situ of vulva: Secondary | ICD-10-CM | POA: Insufficient documentation

## 2021-04-04 DIAGNOSIS — Z79890 Hormone replacement therapy (postmenopausal): Secondary | ICD-10-CM | POA: Diagnosis not present

## 2021-04-04 DIAGNOSIS — I10 Essential (primary) hypertension: Secondary | ICD-10-CM | POA: Diagnosis not present

## 2021-04-04 DIAGNOSIS — K7581 Nonalcoholic steatohepatitis (NASH): Secondary | ICD-10-CM | POA: Diagnosis not present

## 2021-04-04 DIAGNOSIS — E119 Type 2 diabetes mellitus without complications: Secondary | ICD-10-CM | POA: Insufficient documentation

## 2021-04-04 DIAGNOSIS — Z7901 Long term (current) use of anticoagulants: Secondary | ICD-10-CM | POA: Insufficient documentation

## 2021-04-04 DIAGNOSIS — K7589 Other specified inflammatory liver diseases: Secondary | ICD-10-CM | POA: Insufficient documentation

## 2021-04-04 HISTORY — DX: Other specified postprocedural states: R11.2

## 2021-04-04 HISTORY — DX: Unspecified cirrhosis of liver: K74.60

## 2021-04-04 HISTORY — PX: CO2 LASER APPLICATION: SHX5778

## 2021-04-04 HISTORY — DX: Long term (current) use of anticoagulants: Z79.01

## 2021-04-04 HISTORY — DX: Personal history of malignant neoplasm of breast: Z85.3

## 2021-04-04 HISTORY — DX: Other constipation: K59.09

## 2021-04-04 HISTORY — DX: Atherosclerotic heart disease of native coronary artery without angina pectoris: I25.10

## 2021-04-04 HISTORY — DX: Personal history of other endocrine, nutritional and metabolic disease: Z86.39

## 2021-04-04 HISTORY — DX: Other forms of dyspnea: R06.09

## 2021-04-04 HISTORY — DX: Nonalcoholic steatohepatitis (NASH): K75.81

## 2021-04-04 HISTORY — DX: Iron deficiency anemia secondary to blood loss (chronic): D50.0

## 2021-04-04 HISTORY — DX: Other specified postprocedural states: Z98.890

## 2021-04-04 HISTORY — DX: Bronchiectasis, uncomplicated: J47.9

## 2021-04-04 HISTORY — DX: Postprocedural hypothyroidism: E89.0

## 2021-04-04 HISTORY — DX: Thrombocytopenia, unspecified: D69.6

## 2021-04-04 HISTORY — DX: Paroxysmal atrial fibrillation: I48.0

## 2021-04-04 HISTORY — DX: Unspecified osteoarthritis, unspecified site: M19.90

## 2021-04-04 HISTORY — DX: Dyspnea, unspecified: R06.00

## 2021-04-04 HISTORY — DX: Sarcoidosis of lung: D86.0

## 2021-04-04 HISTORY — DX: Carcinoma in situ of vulva: D07.1

## 2021-04-04 LAB — COMPREHENSIVE METABOLIC PANEL
ALT: 28 U/L (ref 0–44)
AST: 32 U/L (ref 15–41)
Albumin: 3.7 g/dL (ref 3.5–5.0)
Alkaline Phosphatase: 73 U/L (ref 38–126)
Anion gap: 8 (ref 5–15)
BUN: 30 mg/dL — ABNORMAL HIGH (ref 8–23)
CO2: 24 mmol/L (ref 22–32)
Calcium: 9.3 mg/dL (ref 8.9–10.3)
Chloride: 110 mmol/L (ref 98–111)
Creatinine, Ser: 0.92 mg/dL (ref 0.44–1.00)
GFR, Estimated: >60 mL/min (ref >60)
Glucose, Bld: 97 mg/dL (ref 70–99)
Potassium: 3.9 mmol/L (ref 3.5–5.1)
Sodium: 142 mmol/L (ref 135–145)
Total Bilirubin: 0.7 mg/dL (ref 0.3–1.2)
Total Protein: 6.4 g/dL — ABNORMAL LOW (ref 6.5–8.1)

## 2021-04-04 SURGERY — CO2 LASER APPLICATION
Anesthesia: General

## 2021-04-04 MED ORDER — EPHEDRINE SULFATE-NACL 50-0.9 MG/10ML-% IV SOSY
PREFILLED_SYRINGE | INTRAVENOUS | Status: DC | PRN
  Administered 2021-04-04 (×2): 10 mg via INTRAVENOUS
  Administered 2021-04-04: 15 mg via INTRAVENOUS

## 2021-04-04 MED ORDER — PHENYLEPHRINE 40 MCG/ML (10ML) SYRINGE FOR IV PUSH (FOR BLOOD PRESSURE SUPPORT)
PREFILLED_SYRINGE | INTRAVENOUS | Status: AC
  Filled 2021-04-04: qty 10

## 2021-04-04 MED ORDER — LIDOCAINE 2% (20 MG/ML) 5 ML SYRINGE
INTRAMUSCULAR | Status: AC
  Filled 2021-04-04: qty 5

## 2021-04-04 MED ORDER — OXYCODONE HCL 5 MG PO TABS: 5.0000 mg | ORAL_TABLET | ORAL | Status: DC | PRN

## 2021-04-04 MED ORDER — TRAMADOL HCL 50 MG PO TABS: 50.0000 mg | ORAL_TABLET | Freq: Four times a day (QID) | ORAL | Status: DC | PRN

## 2021-04-04 MED ORDER — BUPIVACAINE HCL 0.25 % IJ SOLN
INTRAMUSCULAR | Status: DC | PRN
  Administered 2021-04-04: 10 mL

## 2021-04-04 MED ORDER — ROCURONIUM BROMIDE 10 MG/ML (PF) SYRINGE
PREFILLED_SYRINGE | INTRAVENOUS | Status: AC
  Filled 2021-04-04: qty 10

## 2021-04-04 MED ORDER — KETOROLAC TROMETHAMINE 30 MG/ML IJ SOLN
INTRAMUSCULAR | Status: AC
  Filled 2021-04-04: qty 1

## 2021-04-04 MED ORDER — PHENYLEPHRINE 40 MCG/ML (10ML) SYRINGE FOR IV PUSH (FOR BLOOD PRESSURE SUPPORT)
PREFILLED_SYRINGE | INTRAVENOUS | Status: DC | PRN
  Administered 2021-04-04: 120 ug via INTRAVENOUS
  Administered 2021-04-04: 80 ug via INTRAVENOUS

## 2021-04-04 MED ORDER — PHENYLEPHRINE HCL (PRESSORS) 10 MG/ML IV SOLN
INTRAVENOUS | Status: AC
  Filled 2021-04-04: qty 1

## 2021-04-04 MED ORDER — DEXAMETHASONE SODIUM PHOSPHATE 10 MG/ML IJ SOLN
INTRAMUSCULAR | Status: AC
  Filled 2021-04-04: qty 1

## 2021-04-04 MED ORDER — DEXAMETHASONE SODIUM PHOSPHATE 10 MG/ML IJ SOLN
4.0000 mg | INTRAMUSCULAR | Status: AC
  Administered 2021-04-04: 4 mg via INTRAVENOUS

## 2021-04-04 MED ORDER — FENTANYL CITRATE (PF) 100 MCG/2ML IJ SOLN
INTRAMUSCULAR | Status: AC
  Filled 2021-04-04: qty 4

## 2021-04-04 MED ORDER — FENTANYL CITRATE (PF) 100 MCG/2ML IJ SOLN
INTRAMUSCULAR | Status: DC | PRN
  Administered 2021-04-04: 50 ug via INTRAVENOUS

## 2021-04-04 MED ORDER — ACETAMINOPHEN 500 MG PO TABS
ORAL_TABLET | ORAL | Status: AC
  Filled 2021-04-04: qty 2

## 2021-04-04 MED ORDER — ACETIC ACID 5 % SOLN
Status: DC | PRN
  Administered 2021-04-04: 1 via TOPICAL

## 2021-04-04 MED ORDER — BUPIVACAINE LIPOSOME 1.3 % IJ SUSP
INTRAMUSCULAR | Status: DC | PRN
  Administered 2021-04-04: 10 mL

## 2021-04-04 MED ORDER — ONDANSETRON HCL 4 MG/2ML IJ SOLN
INTRAMUSCULAR | Status: DC | PRN
  Administered 2021-04-04: 4 mg via INTRAVENOUS

## 2021-04-04 MED ORDER — ONDANSETRON HCL 4 MG/2ML IJ SOLN
INTRAMUSCULAR | Status: AC
  Filled 2021-04-04: qty 2

## 2021-04-04 MED ORDER — OXYCODONE HCL 5 MG/5ML PO SOLN
5.0000 mg | Freq: Once | ORAL | Status: DC | PRN
Start: 2021-04-04 — End: 2021-04-04

## 2021-04-04 MED ORDER — ONDANSETRON HCL 4 MG/2ML IJ SOLN: 4.0000 mg | Freq: Once | INTRAMUSCULAR | Status: DC | PRN

## 2021-04-04 MED ORDER — PROPOFOL 10 MG/ML IV BOLUS
INTRAVENOUS | Status: AC
  Filled 2021-04-04: qty 20

## 2021-04-04 MED ORDER — ONDANSETRON HCL 4 MG/2ML IJ SOLN: 4.0000 mg | Freq: Four times a day (QID) | INTRAMUSCULAR | Status: DC | PRN

## 2021-04-04 MED ORDER — OXYCODONE HCL 5 MG PO TABS: 5.0000 mg | ORAL_TABLET | Freq: Once | ORAL | Status: DC | PRN

## 2021-04-04 MED ORDER — SILVER SULFADIAZINE 1 % EX CREA
TOPICAL_CREAM | CUTANEOUS | Status: DC | PRN
  Administered 2021-04-04: 1 via TOPICAL

## 2021-04-04 MED ORDER — EPHEDRINE 5 MG/ML INJ
INTRAVENOUS | Status: AC
  Filled 2021-04-04: qty 10

## 2021-04-04 MED ORDER — ONDANSETRON HCL 4 MG PO TABS: 4.0000 mg | ORAL_TABLET | Freq: Four times a day (QID) | ORAL | Status: DC | PRN

## 2021-04-04 MED ORDER — PHENYLEPHRINE HCL-NACL 10-0.9 MG/250ML-% IV SOLN
INTRAVENOUS | Status: DC | PRN
  Administered 2021-04-04: 50 ug/min via INTRAVENOUS

## 2021-04-04 MED ORDER — LACTATED RINGERS IV SOLN: INTRAVENOUS | Status: DC

## 2021-04-04 MED ORDER — PROPOFOL 10 MG/ML IV BOLUS
INTRAVENOUS | Status: DC | PRN
  Administered 2021-04-04: 20 mg via INTRAVENOUS
  Administered 2021-04-04: 100 mg via INTRAVENOUS

## 2021-04-04 MED ORDER — LIDOCAINE 2% (20 MG/ML) 5 ML SYRINGE
INTRAMUSCULAR | Status: DC | PRN
  Administered 2021-04-04: 60 mg via INTRAVENOUS

## 2021-04-04 MED ORDER — ACETAMINOPHEN 500 MG PO TABS
1000.0000 mg | ORAL_TABLET | ORAL | Status: AC
Start: 2021-04-04 — End: 2021-04-04
  Administered 2021-04-04: 1000 mg via ORAL

## 2021-04-04 MED ORDER — FENTANYL CITRATE (PF) 100 MCG/2ML IJ SOLN: 25.0000 ug | INTRAMUSCULAR | Status: DC | PRN

## 2021-04-04 SURGICAL SUPPLY — 34 items
BLADE SURG 15 STRL LF DISP TIS (BLADE) IMPLANT
BLADE SURG 15 STRL SS (BLADE)
CANISTER SUCT 1200ML W/VALVE (MISCELLANEOUS) IMPLANT
CANISTER SUCT 3000ML PPV (MISCELLANEOUS) IMPLANT
CATH ROBINSON RED A/P 16FR (CATHETERS) ×2 IMPLANT
COVER WAND RF STERILE (DRAPES) ×2 IMPLANT
DEPRESSOR TONGUE BLADE STERILE (MISCELLANEOUS) ×2 IMPLANT
DRSG TELFA 3X8 NADH (GAUZE/BANDAGES/DRESSINGS) IMPLANT
ELECT BALL LEEP 3MM BLK (ELECTRODE) IMPLANT
GLOVE SURG ENC MOIS LTX SZ6 (GLOVE) ×2 IMPLANT
GOWN STRL REUS W/TWL LRG LVL3 (GOWN DISPOSABLE) ×2 IMPLANT
KIT TURNOVER CYSTO (KITS) ×2 IMPLANT
NEEDLE HYPO 25X1 1.5 SAFETY (NEEDLE) IMPLANT
NS IRRIG 500ML POUR BTL (IV SOLUTION) IMPLANT
PACK VAGINAL WOMENS (CUSTOM PROCEDURE TRAY) ×2 IMPLANT
PAD PREP 24X48 CUFFED NSTRL (MISCELLANEOUS) ×2 IMPLANT
PUNCH BIOPSY DERMAL 3 (INSTRUMENTS) ×1 IMPLANT
PUNCH BIOPSY DERMAL 3MM (INSTRUMENTS) ×2
PUNCH BIOPSY DERMAL 4MM (INSTRUMENTS) ×2 IMPLANT
SUT VIC AB 0 CT1 36 (SUTURE) IMPLANT
SUT VIC AB 2-0 SH 27 (SUTURE)
SUT VIC AB 2-0 SH 27XBRD (SUTURE) IMPLANT
SUT VIC AB 3-0 PS2 18 (SUTURE)
SUT VIC AB 3-0 PS2 18XBRD (SUTURE) IMPLANT
SUT VIC AB 3-0 SH 27 (SUTURE)
SUT VIC AB 3-0 SH 27X BRD (SUTURE) IMPLANT
SUT VIC AB 4-0 PS2 18 (SUTURE) IMPLANT
SUT VICRYL 0 UR6 27IN ABS (SUTURE) IMPLANT
SWAB OB GYN 8IN STERILE 2PK (MISCELLANEOUS) ×4 IMPLANT
TOWEL OR 17X26 10 PK STRL BLUE (TOWEL DISPOSABLE) ×4 IMPLANT
TUBE CONNECTING 12X1/4 (SUCTIONS) IMPLANT
VACUUM HOSE 7/8X10 W/ WAND (MISCELLANEOUS) IMPLANT
VACUUM HOSE/TUBING 7/8INX6FT (MISCELLANEOUS) ×2 IMPLANT
WATER STERILE IRR 500ML POUR (IV SOLUTION) ×2 IMPLANT

## 2021-04-04 NOTE — Interval H&P Note (Signed)
History and Physical Interval Note:  04/04/2021 9:47 AM  Tasha Roberts  has presented today for surgery, with the diagnosis of VULVAR DYSPLASIA.  The various methods of treatment have been discussed with the patient and family. After consideration of risks, benefits and other options for treatment, the patient has consented to  Procedure(s): CO2 LASER APPLICATION OF THE VULVA (N/A) as a surgical intervention.  The patient's history has been reviewed, patient examined, no change in status, stable for surgery.  I have reviewed the patient's chart and labs.  Questions were answered to the patient's satisfaction.     Thereasa Solo

## 2021-04-04 NOTE — Anesthesia Procedure Notes (Signed)
Procedure Name: LMA Insertion Date/Time: 04/04/2021 10:31 AM Performed by: Mechele Claude, CRNA Pre-anesthesia Checklist: Patient identified, Emergency Drugs available, Suction available and Patient being monitored Patient Re-evaluated:Patient Re-evaluated prior to induction Oxygen Delivery Method: Circle system utilized Preoxygenation: Pre-oxygenation with 100% oxygen Induction Type: IV induction Ventilation: Mask ventilation without difficulty LMA: LMA inserted LMA Size: 4.0 Number of attempts: 1 Airway Equipment and Method: Bite block Placement Confirmation: positive ETCO2 Tube secured with: Tape Dental Injury: Teeth and Oropharynx as per pre-operative assessment

## 2021-04-04 NOTE — Discharge Instructions (Addendum)
Do not take your ELIQUIS for 7 days AFTER the procedure (may resume on Apr 12, 2021).  Vulvar Laser After Care  We recommend purchasing several bags of frozen green peas and dividing them into ziploc bags. You will want to keep these in the freezer and have them ready to use as ice packs to the vulvar incision. Once the ice pack is no longer cold, you can get another from the freezer. The frozen peas mold to your body better than a regular ice pack.  The vulva is the external female genitalia, outside and around the vagina and pubic bone. It consists of:  The skin on, and in front of, the pubic bone.  The clitoris.  The labia majora (large lips) on the outside of the vagina.  The labia minora (small lips) around the opening of the vagina.  The opening and the skin in and around the vagina.  ACTIVITY  Rest as much as possible the first two days after discharge.  No restrictions on heavy lifting   Do not drive a car for 24 hours  Increase activity gradually.  You may exercise after your laser site has healed. It was contribute to delayed healing if you apply too much friction to the area too quickly.  NUTRITION  You may resume your normal diet.  Drink 6 to 8 glasses of fluids a day.  Eat a healthy, balanced diet including portions of food from the meat (protein), milk, fruit, vegetable, and bread groups.  Your caregiver may recommend you take a multivitamin with iron.  ELIMINATION  You may notice that it burns when you urinate. To minimize this spray water onto your vulva as the urine passes out to dilute the urine. We will provide you with a spray bottle. A regular bottle of tap water can also be used.  Pat the area dry with toilet tissue or towel after voiding urine or stool. Do not wipe.  Re-apply burn cream in a thick layer (silvadene) (or neosporin or diaper cream) after voiding, rinsing and drying.  A hair dryer on the cool setting is also comforting to dry or  soothe the area.  If constipation occurs, drink more liquids, and add more fruits, vegetables, and bran to your diet. You may take a mild laxative, such as Milk of Magnesia, Metamucil, or a stool softener such as Colace, with permission from your caregiver. HYGIENE  You may shower and wash your hair.  Avoid tub baths for 4 weeks.  Do not add any bath oils or chemicals to your bath water, after you have permission to take baths.  While passing urine, pour water from a bottle or spray over your vulva to dilute the urine as it passes the incision (this will decrease burning and discomfort).  Clean yourself well after moving your bowels.  After urinating, do not wipe. Dap or pat dry with toilet paper or a dry cleath soft cloth.  A sitz bath will help keep your perineal area clean, reduce swelling, and provide comfort.  Avoid wearing underpants for the first 2 weeks and wear loose skirts to allow circulation of air around the laser site  Apply silvadine or neosporin or desitin to the wound as it heals.  HOME CARE INSTRUCTIONS   Apply a soft ice pack (or frozen bag of peas) to your perineum (vulva) every hour in the first 48 hours after surgery. This will reduce swelling.  Avoid activities that involve a lot of friction between your legs.  Avoid  wearing pants or underpants in the 1st 2 weeks (skirts are preferable).  Take your temperature twice a day and record it, especially if you feel feverish or have chills.  Follow your caregiver's instructions about medicines, activity, and follow-up appointments after surgery.  Do not drink alcohol while taking pain medicine.  You may take over-the-counter medicine for pain, recommended by your caregiver.  If your pain is not relieved with medicine, call your caregiver.  Do not douche or use tampons (use a nonperfumed sanitary pad).  Do not have sexual intercourse until your caregiver gives you permission (typically 4-6 weeks  postoperatively). Hugging, kissing, and playful sexual activity is fine with your caregiver's permission.  Warm sitz baths, with your caregiver's permission, are helpful to control swelling and discomfort.  Take showers instead of baths, until your caregiver gives you permission to take baths.  You may take a mild medicine for constipation, recommended by your caregiver. Bran foods and drinking a lot of fluids will help with constipation.  Make sure your family understands everything about your operation and recovery. SEEK MEDICAL CARE IF:   You notice swelling and redness around the wound area.  You notice a foul smell coming from the wound or on the surgical dressing.  You notice the wound is separating.  You have painful or bloody urination.  You develop nausea and vomiting.  You develop diarrhea.  You develop a rash.  You have a reaction or allergy from the medicine.  You feel dizzy or light-headed.  You need stronger pain medicine. SEEK IMMEDIATE MEDICAL CARE IF:   You develop a temperature of 102 F (38.9 C) or higher.  You pass out.  You develop leg or chest pain.  You develop abdominal pain.  You develop shortness of breath.  You develop bleeding from the wound area.  You see pus in the wound area. MAKE SURE YOU:   Understand these instructions.  Will watch your condition.  Will get help right away if you are not doing well or get worse.  Contact Dr Denman George at # [phone]. After hours this line will connect to the after-hours-nurse line which will contact the doctor on call.  Document Released: 07/08/2004 Document Revised: 04/10/2014 Document Reviewed: 10/26/2009 [facility] Patient Information 2015 [place], [place]. This information is not intended to replace advice given to you by your health care provider. Make sure you discuss any questions you have with your health care provider.  How to Take a [employer]  A sitz bath is a warm water bath that may  be used to care for your rectum, genital area, or the area between your rectum and genitals (perineum). In a sitz bath, the water only comes up to your hips and covers your buttocks. A sitz bath may be done in a bathtub or with a portable sitz bath that fits over the toilet. Your health care provider may recommend a sitz bath to help:  Relieve pain and discomfort after delivering a baby.  Relieve pain and itching from hemorrhoids or anal fissures.  Relieve pain after certain surgeries.  Relax muscles that are sore or tight. How to take a sitz bath Take 3-4 sitz baths a day, or as many as told by your health care provider. Bathtub sitz bath To take a sitz bath in a bathtub: 1. Partially fill a bathtub with warm water. The water should be deep enough to cover your hips and buttocks when you are sitting in the tub. 2. Follow your  health care provider's instructions if you are told to put medicine in the water. 3. Sit in the water. Open the tub drain a little, and leave it open during your bath. 4. Turn on the warm water again, enough to replace the water that is draining out. Keep the water running throughout your bath. This helps keep the water at the right level and temperature. 5. Soak in the water for 15-20 minutes, or as long as told by your health care provider. 6. When you are done, be careful when you stand up. You may feel dizzy. 7. After the sitz bath, pat yourself dry. Do not rub your skin to dry it.   Over-the-toilet sitz bath To take a sitz bath with an over-the-toilet basin: 1. Follow the manufacturer's instructions. 2. Fill the basin with warm water. 3. Follow your health care provider's instructions if you were told to put medicine in the water. 4. Sit on the seat. Make sure the water covers your buttocks and perineum. 5. Soak in the water for 15-20 minutes, or as long as told by your health care provider. 6. After the sitz bath, pat yourself dry. Do not rub your skin to dry  it. 7. Clean and dry the basin between uses. 8. Discard the basin if it cracks, or according to the manufacturer's instructions.   Contact a health care provider if:  Your pain or itching gets worse. Do not continue with sitz baths if your symptoms get worse.  You have new symptoms. Do not continue with sitz baths until you talk with your health care provider. Summary  A sitz bath is a warm water bath in which the water only comes up to your hips and covers your buttocks.  A sitz bath may help relieve pain and discomfort after delivering a baby. It also may help with pain and itching from hemorrhoids or anal fissures, or pain after certain surgeries. It can also help to relax muscles that are sore or tight.  Take 3-4 sitz baths a day, or as many as told by your health care provider. Soak in the water for 15-20 minutes.  Do not continue with sitz baths if your symptoms get worse. This information is not intended to replace advice given to you by your health care provider. Make sure you discuss any questions you have with your health care provider. Document Revised: 08/09/2020 Document Reviewed: 08/09/2020 Elsevier Patient Education  2021 [place] for Discharge Teaching: EXPAREL (bupivacaine liposome injectable suspension)   Your surgeon or anesthesiologist gave you EXPAREL(bupivacaine) to help control your pain after surgery.   EXPAREL is a local anesthetic that provides pain relief by numbing the tissue around the surgical site.  EXPAREL is designed to release pain medication over time and can control pain for up to 72 hours.  Depending on how you respond to EXPAREL, you may require less pain medication during your recovery.  Possible side effects:  Temporary loss of sensation or ability to move in the area where bupivacaine was injected.  Nausea, vomiting, constipation  Rarely, numbness and tingling in your mouth or lips, lightheadedness, or anxiety may  occur.  Call your doctor right away if you think you may be experiencing any of these sensations, or if you have other questions regarding possible side effects.  Follow all other discharge instructions given to you by your surgeon or nurse. Eat a healthy diet and drink plenty of water or other fluids.  If you return to the hospital  for any reason within 96 hours following the administration of EXPAREL, it is important for health care providers to know that you have received this anesthetic. A teal colored band has been placed on your arm with the date, time and amount of EXPAREL you have received in order to alert and inform your health care providers. Please leave this armband in place for the full 96 hours following administration, and then you may remove the band.   Post Anesthesia Home Care Instructions  Activity: Get plenty of rest for the remainder of the day. A responsible individual must stay with you for 24 hours following the procedure.  For the next 24 hours, DO NOT: -Drive a car -Paediatric nurse -Drink alcoholic beverages -Take any medication unless instructed by your physician -Make any legal decisions or sign important papers.  Meals: Start with liquid foods such as gelatin or soup. Progress to regular foods as tolerated. Avoid greasy, spicy, heavy foods. If nausea and/or vomiting occur, drink only clear liquids until the nausea and/or vomiting subsides. Call your physician if vomiting continues.  Special Instructions/Symptoms: Your throat may feel dry or sore from the anesthesia or the breathing tube placed in your throat during surgery. If this causes discomfort, gargle with warm salt water. The discomfort should disappear within 24 hours.  No additional Tylenol/acetaminophen until after 3:00 pm today if needed.

## 2021-04-04 NOTE — Op Note (Signed)
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 1x1cm acetowhite changes to left side of clitoral hood.   Procedure: The patient was identified in the preoperative holding area. Informed consent was signed on the chart. Patient was seen history was reviewed and exam was performed.   The patient was then taken to the operating room and placed in the supine position with SCD hose on. General anesthesia was then induced without difficulty. She was then placed in the dorsolithotomy position. The perineum was prepped with Betadine. The vagina was prepped with Betadine. The patient was then draped after the prep was dried. A Foley catheter was inserted into the bladder under sterile conditions to drain the bladder then removed.  Timeout was performed the patient, procedure, antibiotic, allergy, and length of procedure. 5% acetic acid solution was applied to the perineum. The vulvar tissues were inspected for areas of acetowhite changes or leukoplakia. The lesion was identified and the marking pen was used to circumscribe the area with appropriate surgical margins. The subcuticular tissues were infiltrated with a mixture of exparel and marcaine.   The patient's surgical field was draped with wet towels. The staff and patient ensured laser-safe eyewear and masks were fitted. The laser was set to 12 watts continuous. The laser was tested for accuracy on a tongue depressor.  The laser was applied to the circumscribed area of the vulva that had been previously identified. The tissue was ablated to the desired depth and the eschar was  removed with a moistened sponge. When the entire lesion had been ablated the procedure was complete.  Silvadine cream was applied to the laser site.  All instrument, suture, laparotomy, Ray-Tec, and needle counts were correct x2. The patient tolerated the procedure well and was taken recovery room in stable condition. This is Everitt Amber dictating an operative note on Riki Sheer, MD

## 2021-04-04 NOTE — Transfer of Care (Signed)
Immediate Anesthesia Transfer of Care Note  Patient: Tasha Roberts  Procedure(s) Performed: Procedure(s) (LRB): CO2 LASER APPLICATION OF THE VULVA (N/A)  Patient Location: PACU  Anesthesia Type: General  Level of Consciousness: awake, alert  and oriented  Airway & Oxygen Therapy: Patient Spontanous Breathing and Patient connected to nasal cannula oxygen  Post-op Assessment: Report given to PACU RN and Post -op Vital signs reviewed and stable  Post vital signs: Reviewed and stable  Complications: No apparent anesthesia complications Last Vitals:  Vitals Value Taken Time  BP 114/41 04/04/21 1111  Temp    Pulse 64 04/04/21 1114  Resp 20 04/04/21 1114  SpO2 100 % 04/04/21 1114  Vitals shown include unvalidated device data.  Last Pain:  Vitals:   04/04/21 0831  TempSrc: Oral  PainSc: 0-No pain      Patients Stated Pain Goal: 5 (62/03/55 9741)  Complications: No complications documented.

## 2021-04-04 NOTE — Anesthesia Preprocedure Evaluation (Addendum)
Anesthesia Evaluation  Patient identified by MRN, date of birth, ID band Patient awake    Reviewed: Allergy & Precautions, NPO status , Patient's Chart, lab work & pertinent test results  History of Anesthesia Complications (+) PONV and history of anesthetic complications  Airway Mallampati: III  TM Distance: >3 FB Neck ROM: Full    Dental  (+) Edentulous Lower, Edentulous Upper   Pulmonary   Sarcoidosis Bronchiectasis    Pulmonary exam normal        Cardiovascular hypertension, + CAD  Normal cardiovascular exam+ dysrhythmias Atrial Fibrillation      Neuro/Psych negative neurological ROS  negative psych ROS   GI/Hepatic negative GI ROS, (+) Cirrhosis       ,   Endo/Other  Hypothyroidism  Na 147   Renal/GU negative Renal ROS     Musculoskeletal  (+) Arthritis ,   Abdominal   Peds  Hematology  (+) anemia ,  Thrombocytopenia, Plt 116k On eliquis    Anesthesia Other Findings Covid test negative   Reproductive/Obstetrics                            Anesthesia Physical Anesthesia Plan  ASA: III  Anesthesia Plan: General   Post-op Pain Management:    Induction: Intravenous  PONV Risk Score and Plan: 4 or greater and Treatment may vary due to age or medical condition and Ondansetron  Airway Management Planned: LMA  Additional Equipment: None  Intra-op Plan:   Post-operative Plan: Extubation in OR  Informed Consent: I have reviewed the patients History and Physical, chart, labs and discussed the procedure including the risks, benefits and alternatives for the proposed anesthesia with the patient or authorized representative who has indicated his/her understanding and acceptance.     Dental advisory given  Plan Discussed with: CRNA and Anesthesiologist  Anesthesia Plan Comments:        Anesthesia Quick Evaluation

## 2021-04-04 NOTE — Anesthesia Postprocedure Evaluation (Signed)
Anesthesia Post Note  Patient: Tasha Prevette  Procedure(s) Performed: CO2 LASER APPLICATION OF THE VULVA (N/A )     Patient location during evaluation: PACU Anesthesia Type: General Level of consciousness: awake and alert Pain management: pain level controlled Vital Signs Assessment: post-procedure vital signs reviewed and stable Respiratory status: spontaneous breathing, nonlabored ventilation and respiratory function stable Cardiovascular status: stable and blood pressure returned to baseline Anesthetic complications: no   No complications documented.  Last Vitals:  Vitals:   04/04/21 1205 04/04/21 1215  BP: (!) 110/40 (!) 104/38  Pulse:  (!) 54  Resp:  14  Temp:    SpO2:  99%    Last Pain:  Vitals:   04/04/21 1215  TempSrc:   PainSc: 0-No pain                 Audry Pili

## 2021-04-05 ENCOUNTER — Encounter (HOSPITAL_BASED_OUTPATIENT_CLINIC_OR_DEPARTMENT_OTHER): Payer: Self-pay | Admitting: Gynecologic Oncology

## 2021-04-05 ENCOUNTER — Telehealth: Payer: Self-pay

## 2021-04-05 NOTE — Telephone Encounter (Signed)
Ms Regan states that she is eating,drinking, and urinating well. Not passing gas. She will begin Senokot-S 2 tabs with 8 oz of water bid.  If no good BM by Sunday 04-07-21, she can add a capful of Miralax bid. Afebrile. Laser wound oozing a small amount of serosanguinous fluid. Pt using bags of frozen peas and squirt bottle as directed. Pt to begin sitz baths 1-3 times daily. Pat dry or sue hair dryer on low setting then apply the silvadene cream to wounds. Pt not in any pain at this time as numbing medication is still effective. Pt aware of post op appointments as well as the office number [phone] and after hours number [phone] to call if she has any questions or concerns

## 2021-04-09 ENCOUNTER — Other Ambulatory Visit (HOSPITAL_COMMUNITY): Payer: Medicare Other

## 2021-04-16 ENCOUNTER — Ambulatory Visit (HOSPITAL_COMMUNITY): Payer: Medicare Other | Admitting: Hematology

## 2021-04-22 ENCOUNTER — Other Ambulatory Visit: Payer: Self-pay

## 2021-04-22 ENCOUNTER — Ambulatory Visit
Admission: EM | Admit: 2021-04-22 | Discharge: 2021-04-22 | Disposition: A | Discharge status: Home / Self Care | Payer: Medicare Other

## 2021-04-23 ENCOUNTER — Inpatient Hospital Stay: Payer: Medicare Other | Admitting: Gynecologic Oncology

## 2021-04-24 ENCOUNTER — Inpatient Hospital Stay (HOSPITAL_COMMUNITY): Payer: Medicare Other | Attending: Physician Assistant

## 2021-04-24 ENCOUNTER — Other Ambulatory Visit: Payer: Self-pay

## 2021-04-24 DIAGNOSIS — K922 Gastrointestinal hemorrhage, unspecified: Secondary | ICD-10-CM | POA: Diagnosis not present

## 2021-04-24 DIAGNOSIS — D500 Iron deficiency anemia secondary to blood loss (chronic): Secondary | ICD-10-CM | POA: Insufficient documentation

## 2021-04-24 LAB — CBC WITH DIFFERENTIAL/PLATELET
Abs Immature Granulocytes: 0.02 10*3/uL (ref 0.00–0.07)
Basophils Absolute: 0 10*3/uL (ref 0.0–0.1)
Basophils Relative: 1 %
Eosinophils Absolute: 0.1 10*3/uL (ref 0.0–0.5)
Eosinophils Relative: 2 %
HCT: 37.1 % (ref 36.0–46.0)
Hemoglobin: 12.6 g/dL (ref 12.0–15.0)
Immature Granulocytes: 0 %
Lymphocytes Relative: 21 %
Lymphs Abs: 1.3 10*3/uL (ref 0.7–4.0)
MCH: 31.3 pg (ref 26.0–34.0)
MCHC: 34 g/dL (ref 30.0–36.0)
MCV: 92.3 fL (ref 80.0–100.0)
Monocytes Absolute: 0.6 10*3/uL (ref 0.1–1.0)
Monocytes Relative: 9 %
Neutro Abs: 4.1 10*3/uL (ref 1.7–7.7)
Neutrophils Relative %: 67 %
Platelets: 151 10*3/uL (ref 150–400)
RBC: 4.02 MIL/uL (ref 3.87–5.11)
RDW: 12.7 % (ref 11.5–15.5)
WBC: 6.2 10*3/uL (ref 4.0–10.5)
nRBC: 0 % (ref 0.0–0.2)

## 2021-04-24 LAB — IRON AND TIBC
Iron: 50 ug/dL (ref 28–170)
Saturation Ratios: 27 % (ref 10.4–31.8)
TIBC: 187 ug/dL — ABNORMAL LOW (ref 250–450)
UIBC: 137 ug/dL

## 2021-04-24 LAB — FERRITIN: Ferritin: 817 ng/mL — ABNORMAL HIGH (ref 11–307)

## 2021-04-24 NOTE — Progress Notes (Signed)
Virtual Visit via Telephone Note [facility]  I connected with Tasha Roberts on 04/25/21 at 9:11 AM  by telephone and verified that I am speaking with the correct person using two identifiers.  Location: Patient: Home Provider: [facility]   I discussed the limitations, risks, security and privacy concerns of performing an evaluation and management service by telephone and the availability of in person appointments. I also discussed with the patient that there may be a patient responsible charge related to this service. The patient expressed understanding and agreed to proceed.   History of Present Illness: Tasha Roberts is contacted today for follow-up of her iron deficiency anemia and thrombocytopenia.  She was last seen by Dr. Delton Coombes and Tarri Abernethy PA-C on 03/04/2021 for initial work-up and consultation.  Iron deficiency anemia deemed likely to be secondary to GI bleeding, as the patient had reported ongoing melena for the past 2 to 3 years.  She also had had some acute bleeding following a vulvar biopsy, as she had been on Eliquis for atrial fibrillation.  Eliquis was held due to acute bleeding episode.  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  At her last visit, work-up was initiated to delineate definitive causes of her thrombocytopenia and anemia.  She was given 2 doses of IV Feraheme and returns today to discuss results of work-up and follow-up on her fatigue and anemia.  INTERVAL HISTORY: At today's visit, Tasha Roberts reports that she is feeling fair.  She reports that she has had a mild cough and some sinus drainage for the past 2 weeks, but reportedly tested negative for COVID-19.  She reports that she feels slightly improved after her IV iron infusions, but that she still remains significantly fatigued.  She currently reports that her energy is 50% and appetite is 50%.  She reports that she is maintaining a stable weight  since our last visit.  She reports that she has not had any bleeding episodes over the past month.  She was previously experiencing vulvar bleeding at the site of a vulvar biopsy, but she had laser cauterization performed and has not had any bleeding since that time.  She was started back on her Eliquis by the gynecological surgeon.  She also reports that she has not had any melena for the past month.  No other sources of blood loss such as epistaxis, hemoptysis, hematuria, hematemesis, hematochezia.  She remains scheduled for EGD in July with her gastroenterologist.  No fever, chills, night sweats, shortness of breath, palpitations, presyncopal episodes, chest pain, nausea, vomiting, abdominal pain.  Denies any signs or symptoms of blood loss.  No current signs or symptoms of blood clots.  We reviewed her most recent labs, which show improvement in her hemoglobin after IV iron.   Observations/Objective: Review of Systems  Constitutional: Positive for malaise/fatigue. Negative for chills, fever and weight loss.  HENT: Positive for congestion.   Respiratory: Positive for cough. Negative for shortness of breath.   Cardiovascular: Negative for chest pain and palpitations.  Gastrointestinal: Negative for abdominal pain, blood in stool, melena, nausea and vomiting.  Neurological: Positive for tingling (hands). Negative for headaches.     PHYSICAL EXAM (per limitations of virtual telephone visit): The patient is alert and oriented x 3, exhibiting adequate mentation, good mood, and ability to speak in full sentences and execute sound judgement.   Assessment: 1.  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 Recently checked (3/87/5643) folic acid and P29 were within normal limits  2.  Anemia secondary to blood loss -CBC on (02/22/2021) with hemoglobin 8.2 and iron panel (02/21/2021) with ferritin 79, serum iron 56, saturation 23% -Reports ongoing melena for the past 2 to 3 years, is scheduled for EGD/colonoscopy with her GI provider in July 2022 -Recent blood loss from postop bleeding following vulvar biopsy (02/11/2021) - resolved after laser cauterization -No history of blood transfusions -She is on Eliquis for atrial fibrillation -Work-up for other sources of anemia was negative - normal SPEP, normal light chain ratio, normal haptoglobin, appropriately elevated erythropoietin - LDH slightly elevated at 209 - Received Feraheme x2 (03/15/2021 and 03/25/2021) - Repeat labs (04/24/2021) showed improvement in hemoglobin to 12.6; ferritin elevated at 817; iron saturation 27%  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  ER/PR positive. -Completed XRT on 01/23/2013. -She does not recollect taking any tamoxifen or anastrazole -Bilateral diagnostic mammogram with left ultrasound on 08/02/2019 was benign. -Previously followed with the breast care center at [facility] in [place], [place] - recently relocated to [place].  (External notes from NP Johnsie Kindred reviewed by me) - Mammogram (03/15/2021) was BI-RADS Category 2, benign, no mammographic evidence of malignancy  4.  Social history -Patient is a  lifelong non-smoker.  No tobacco or illicit drug use. -She ambulates with a Rollator due to generalized weakness. -She currently lives at home with her daughter.  Patient's husband recently passed away in 11-Mar-2021 -Patient retired from work as a Barista.   Plan: 1. Thrombocytopenia, mild to moderate -Likely secondary to liver cirrhosis and splenomegaly  - Repeat CBC (04/24/2021) shows improvement in platelets to 151 - We will repeat CBC and follow-up in 3 months  2.  Normocytic anemia, secondary to acute blood loss -Likely secondary to longstanding chronic disease, occult GI blood loss, and recent postoperative bleeding after vulvar biopsy while on Eliquis -Work-up for other sources of anemia was negative - Received Feraheme x2 in April 2022 - Repeat labs (04/24/2021) showed improvement in hemoglobin to 12.6; ferritin elevated at 817; iron saturation 27% - Repeat CBC and iron panel and follow-up in 3 months  3.  History of left breast DCIS in 11-Mar-2012: - Most recent mammogram (03/15/2021) was BI-RADS Category 2, benign - Patient will need another mammogram next year (April 2022), which can be ordered either by our clinic or by her PCP   Follow Up Instructions: -Repeat labs in 3 months (CBC, iron, ferritin) - Follow-up visit in 3 months - Continue GI follow-up and EGD as previously scheduled    I discussed the assessment and treatment plan with the patient. The patient was provided an opportunity to ask questions and all were answered. The patient agreed with the plan and demonstrated an understanding of the instructions.  The patient was advised to call back or seek an in-person evaluation if the symptoms worsen or if the condition fails to improve as anticipated.  I provided 23 minutes of non-face-to-face time during this encounter.   Harriett Rush, PA-C

## 2021-04-25 ENCOUNTER — Inpatient Hospital Stay (HOSPITAL_BASED_OUTPATIENT_CLINIC_OR_DEPARTMENT_OTHER): Payer: Medicare Other | Admitting: Physician Assistant

## 2021-04-25 DIAGNOSIS — D500 Iron deficiency anemia secondary to blood loss (chronic): Secondary | ICD-10-CM

## 2021-04-25 DIAGNOSIS — Z0000 Encounter for general adult medical examination without abnormal findings: Secondary | ICD-10-CM

## 2021-04-25 DIAGNOSIS — K7469 Other cirrhosis of liver: Secondary | ICD-10-CM | POA: Diagnosis not present

## 2021-04-26 ENCOUNTER — Ambulatory Visit (HOSPITAL_COMMUNITY): Payer: Medicare Other | Admitting: Physician Assistant

## 2021-04-26 ENCOUNTER — Telehealth: Payer: Self-pay | Admitting: *Deleted

## 2021-04-26 NOTE — Telephone Encounter (Signed)
Returned the patient's call and reschedule the appt from today to 6/9. Patient canceled due to her daughter is in the hospital. Patient stated that she is fine and has no issues or concerns

## 2021-04-29 ENCOUNTER — Encounter (HOSPITAL_COMMUNITY): Payer: Self-pay | Admitting: Hematology

## 2021-04-29 ENCOUNTER — Inpatient Hospital Stay: Payer: Medicare Other | Admitting: Gynecologic Oncology

## 2021-04-30 ENCOUNTER — Encounter: Payer: Self-pay | Admitting: *Deleted

## 2021-04-30 ENCOUNTER — Encounter: Payer: Self-pay | Admitting: Cardiology

## 2021-04-30 ENCOUNTER — Ambulatory Visit (INDEPENDENT_AMBULATORY_CARE_PROVIDER_SITE_OTHER): Payer: Medicare Other | Admitting: Cardiology

## 2021-04-30 VITALS — BP 114/52 | HR 62 | Ht 64.0 in | Wt 131.2 lb

## 2021-04-30 DIAGNOSIS — I10 Essential (primary) hypertension: Secondary | ICD-10-CM

## 2021-04-30 DIAGNOSIS — E782 Mixed hyperlipidemia: Secondary | ICD-10-CM | POA: Diagnosis not present

## 2021-04-30 DIAGNOSIS — I480 Paroxysmal atrial fibrillation: Secondary | ICD-10-CM | POA: Diagnosis not present

## 2021-04-30 NOTE — Patient Instructions (Signed)
Medication Instructions:  Continue all current medications.   Labwork: none  Testing/Procedures: none  Follow-Up: 6 months   Any Other Special Instructions Will Be Listed Below (If Applicable).   If you need a refill on your cardiac medications before your next appointment, please call your pharmacy.  

## 2021-04-30 NOTE — Progress Notes (Signed)
Clinical Summary Tasha Roberts is a 80 y.o.female seen as a new consult, eferred by PA Pennington for the following medical problems.  Previously followed at [facility] cardiology  1. PAF - no recent palpitations - compliant with meds - had been on eliquis around time of vulvar surgery, off eliquis x 2 weeks. Now back on eliquis - no recent bleeding - recent issues with anemia have improved. Hgb had been down to 8.2, now up to 11.9. Platelets 151   2. Coronary calcifications -noted on prior CT imaging at [facility] - 08/2019 nuclear stress no ischemia  3. Thrombocytopenia - followed by heme, from notes thought seconcdary to NASH liver disease    4. HTN - compliant with meds  5. Hyperlipidemia 04/2020 TC 180 HDL 58 LDL 92 TG 148 - she is on statin Past Medical History:  Diagnosis Date  . Anemia due to blood loss    iron infusion's last one 03-25-2021 (pt bleeding post vulvar bx 02-11-2021, hospiatal admission 02-21-2021)  . Anticoagulant long-term use    eliquis--- managed by pcp   . Arthritis    hands  . Bronchiectasis [facility])    pulmonology --- dr Halford Chessman   (04-01-2021  pt stated no supplemental oxygen use since 01/ 2022 and no inhaler use,  stated checks O2 at home during the day 98-99%)  . Chronic constipation   . Coronary artery calcification    per lexiscan 08-17-2019 result in care everywhere  . DOE (dyspnea on exertion)    04-01-2021  per pt only going up stairs   . History of diabetes mellitus, type II    04-01-2021  per pt no issues after weight loss, last taken medication approx 2017  . History of Graves' disease 2017   s/p RAI  . History of left breast cancer    dx 2013,  s/p left partial mastecotmy w/ node dissection 09-15-2012, low grade DCIS, no chemo, completed radiation 02/ 2014,  per pt no recurrence  . History of respiratory syncytial virus (RSV) infection 09/2020   w/ hypoxia  , hospital admission 10/ 2021  and acute exacerbation bronchiectasis;   follow up post hospital pulmonogy dr sood  note in epic  . Hyperlipidemia   . Hypertension    followed by pcp   (pt had lexiscan in care everywhere dated 08-17-2019 no ischemia , non-obstructive extensive calcification, ef 69%)  . Hypothyroidism   . Hypothyroidism, postradioiodine therapy    followed by pcp  . Liver cirrhosis secondary to NASH (nonalcoholic steatohepatitis) ([place])    followed by dr Merrilee Jansky ([facility])---- dx 2004,  compensated ,  last liver bx (03/ 2015) fibrosis stage 3;  moderate portal hypertensive gastrophy  . PAF (paroxysmal atrial fibrillation) ([place]) previous cardiologist lov note w/ Suann Larry PA ([facility] in [place]) dated 01-24-2020 in care everywhere;  (04-01-2021 per pt has appt w/ new cardiologist , dr j. Senaida Chilcote 04-30-2021)   first dx 2020--  had event monitor/ stress test/ echo all results in care everywhere (monitor 08-03-2019 short runs AFib conversion pause <2 secconds, rate controlled;  echo 12-14-2019 ef 60%, mild concentric LVH, G2DD, RSVP 50.24mmHg)  . Personal history of radiation therapy 2013   left breast cancer,  completed 02/ 2014  . PONV (postoperative nausea and vomiting)   . 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 Thrombocytopenia [facility])    hematology/ oncology--- dr s. Delton Coombes  .  VIN III (vulvar intraepithelial neoplasia III)      Allergies  Allergen Reactions  . Dicyclomine Other (See Comments)    Dry cough  . Glucophage [Metformin] Diarrhea  . Losartan Other (See Comments)    Heartburn      Current Outpatient Medications  Medication Sig Dispense Refill  . atorvastatin (LIPITOR) 20 MG tablet Take 20 mg by mouth daily.    . Cholecalciferol 50 MCG (2000 UT) CAPS Take 2,000 mg by mouth daily.    Marland Kitchen diltiazem (CARDIZEM CD) 120 MG 24 hr capsule Take 1 capsule (120 mg total) by mouth daily. (Patient taking differently: Take 120 mg by mouth daily.) 30  capsule 1  . levothyroxine (SYNTHROID) 100 MCG tablet Take 100 mcg by mouth daily before breakfast.    . lisinopril-hydrochlorothiazide (ZESTORETIC) 20-25 MG tablet TAKE (1) TABLET BY MOUTH EACH MORNING.    . nitroGLYCERIN (NITROSTAT) 0.4 MG SL tablet Place 0.4 mg under the tongue every 5 (five) minutes as needed for chest pain.    . traMADol (ULTRAM) 50 MG tablet Take 1 tablet (50 mg total) by mouth every 6 (six) hours as needed for severe pain. For AFTER surgery only, do not take and drive (Patient not taking: No sig reported) 10 tablet 0   No current facility-administered medications for this visit.     Past Surgical History:  Procedure Laterality Date  . ABDOMINAL HYSTERECTOMY  1989   W/  UNILATERAL SALPINGOOPHORECTOMY  . CATARACT EXTRACTION W/ INTRAOCULAR LENS  IMPLANT, BILATERAL  2016  . CHOLECYSTECTOMY OPEN  1987  . CO2 LASER APPLICATION N/A 5/64/3329   Procedure: CO2 LASER APPLICATION OF THE VULVA;  Surgeon: Everitt Amber, MD;  Location: [facility];  Service: Gynecology;  Laterality: N/A;  . COLONOSCOPY WITH ESOPHAGOGASTRODUODENOSCOPY (EGD)  last one 08/ 2018  @[facility]   . LUMBAR LAMINECTOMY  2009  . PARTIAL MASTECTOMY WITH AXILLARY SENTINEL LYMPH NODE BIOPSY Left 09-15-2012   [facility]     Allergies  Allergen Reactions  . Dicyclomine Other (See Comments)    Dry cough  . Glucophage [Metformin] Diarrhea  . Losartan Other (See Comments)    Heartburn       Family History  Problem Relation Age of Onset  . Hypertension Mother   . Dementia Mother   . Heart disease Father   . Heart disease Brother      Social History Tasha Roberts reports that she has never smoked. She has never used smokeless tobacco. Tasha Roberts reports previous alcohol use.   Review of Systems CONSTITUTIONAL: No weight loss, fever, chills, weakness or fatigue.  HEENT: Eyes: No visual loss, blurred vision, double vision or yellow sclerae.No hearing loss, sneezing, congestion, runny nose or  sore throat.  SKIN: No rash or itching.  CARDIOVASCULAR: per hpi RESPIRATORY: No shortness of breath, cough or sputum.  GASTROINTESTINAL: No anorexia, nausea, vomiting or diarrhea. No abdominal pain or blood.  GENITOURINARY: No burning on urination, no polyuria NEUROLOGICAL: No headache, dizziness, syncope, paralysis, ataxia, numbness or tingling in the extremities. No change in bowel or bladder control.  MUSCULOSKELETAL: No muscle, back pain, joint pain or stiffness.  LYMPHATICS: No enlarged nodes. No history of splenectomy.  PSYCHIATRIC: No history of depression or anxiety.  ENDOCRINOLOGIC: No reports of sweating, cold or heat intolerance. No polyuria or polydipsia.  Marland Kitchen   Physical Examination Today's Vitals   04/30/21 1500  BP: (!) 114/52  Pulse: 62  SpO2: 96%  Weight: 131 lb 3.2 oz (59.5 kg)  Height:  5\' 4"  (1.626 m)   Body mass index is 22.52 kg/m.  Gen: resting comfortably, no acute distress HEENT: no scleral icterus, pupils equal round and reactive, no palptable cervical adenopathy,  CV: RRR, no m/r/g, no jvd Resp: Clear to auscultation bilaterally GI: abdomen is soft, non-tender, non-distended, normal bowel sounds, no hepatosplenomegaly MSK: extremities are warm, no edema.  Skin: warm, no rash Neuro:  no focal deficits Psych: appropriate affect   Diagnostic Studies  Jan 2021 echo [place] Summary  1. Overall left ventricular ejection fraction is estimated at 60 to 65%.  2. Normal global left ventricular systolic function.  3. (Grade 2) Moderately abnormal left ventricular diastolic filling.  4. Mild concentric left ventricular hypertrophy.  5. Mildly dilated left atrium.  6. Mild mitral annular calcification.  7. Moderately elevated pulmonary artery systolic pressure.  8. No prior echo.   08/2019 nuclear stress 1. Normal myocardial perfusion scan.  2. Normal left ventricular systolic function.  3. Dense coronary calcification as described.  4.  Prominent ascending aorta.      Assessment and Plan  1. PAF - no recent symptoms - EKG today shows NSR - continue diltiazem, contineu eliquis.  2. HTN - at goal, continue current meds  3. Hyperlipidemia - LDL is reasonable,continue statin        Arnoldo Lenis, M.D.

## 2021-05-01 ENCOUNTER — Telehealth: Payer: Self-pay | Admitting: Cardiology

## 2021-05-01 MED ORDER — DILTIAZEM HCL ER COATED BEADS 120 MG PO CP24: 120.0000 mg | ORAL_CAPSULE | Freq: Every day | ORAL | 1 refills | Status: DC

## 2021-05-01 NOTE — Telephone Encounter (Signed)
*  STAT* If patient is at the pharmacy, call can be transferred to refill team.   1. Which medications need to be refilled? (please list name of each medication and dose if known) diltiazem (CARDIZEM CD) 120 MG 24 hr capsule  2. Which pharmacy/location (including street and city if local pharmacy) is medication to be sent to? [place]  3. Do they need a 30 day or 90 day supply? [place]

## 2021-05-10 ENCOUNTER — Encounter: Payer: Self-pay | Admitting: Gynecologic Oncology

## 2021-05-13 ENCOUNTER — Inpatient Hospital Stay: Payer: Medicare Other | Attending: Gynecologic Oncology | Admitting: Gynecologic Oncology

## 2021-05-13 ENCOUNTER — Encounter: Payer: Self-pay | Admitting: Gynecologic Oncology

## 2021-05-13 ENCOUNTER — Other Ambulatory Visit: Payer: Self-pay

## 2021-05-13 VITALS — BP 120/62 | HR 61 | Temp 97.6°F | Resp 16 | Ht 64.0 in | Wt 131.8 lb

## 2021-05-13 DIAGNOSIS — Z7901 Long term (current) use of anticoagulants: Secondary | ICD-10-CM | POA: Insufficient documentation

## 2021-05-13 DIAGNOSIS — E119 Type 2 diabetes mellitus without complications: Secondary | ICD-10-CM | POA: Diagnosis not present

## 2021-05-13 DIAGNOSIS — K7581 Nonalcoholic steatohepatitis (NASH): Secondary | ICD-10-CM | POA: Insufficient documentation

## 2021-05-13 DIAGNOSIS — Z923 Personal history of irradiation: Secondary | ICD-10-CM | POA: Insufficient documentation

## 2021-05-13 DIAGNOSIS — I4891 Unspecified atrial fibrillation: Secondary | ICD-10-CM | POA: Insufficient documentation

## 2021-05-13 DIAGNOSIS — L292 Pruritus vulvae: Secondary | ICD-10-CM | POA: Insufficient documentation

## 2021-05-13 DIAGNOSIS — D071 Carcinoma in situ of vulva: Secondary | ICD-10-CM | POA: Insufficient documentation

## 2021-05-13 DIAGNOSIS — I2510 Atherosclerotic heart disease of native coronary artery without angina pectoris: Secondary | ICD-10-CM | POA: Insufficient documentation

## 2021-05-13 DIAGNOSIS — K7460 Unspecified cirrhosis of liver: Secondary | ICD-10-CM | POA: Diagnosis not present

## 2021-05-13 DIAGNOSIS — Z79899 Other long term (current) drug therapy: Secondary | ICD-10-CM | POA: Diagnosis not present

## 2021-05-13 DIAGNOSIS — N904 Leukoplakia of vulva: Secondary | ICD-10-CM | POA: Insufficient documentation

## 2021-05-13 DIAGNOSIS — Z853 Personal history of malignant neoplasm of breast: Secondary | ICD-10-CM | POA: Insufficient documentation

## 2021-05-13 MED ORDER — CLOBETASOL PROPIONATE 0.05 % EX OINT: 1.0000 "application " | TOPICAL_OINTMENT | Freq: Every evening | CUTANEOUS | 3 refills | Status: AC

## 2021-05-13 NOTE — Progress Notes (Signed)
Follow-up Note: Gyn-Onc  Consult was requested by Dr. Elonda Husky for the evaluation of Tasha Roberts 80 y.o. female  CC:  Chief Complaint  Patient presents with  . Vulvar intraepithelial neoplasia (VIN) grade 3    Assessment/Plan:  [name]  is a 80 y.o.  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 S/p vulvar laser on 04/04/21.  I recommend follow-up with Dr Elonda Husky in 6 months and then at annual intervals if this exam is unremarkable.   We will be happy to follow-up with her if there is a new lesion that develops.   Topical steroids could be considered for long-term management of her vulvar dystrophy after she has healed from her surgical procedure.   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  The patient has a longstanding history of vulvar pruritus.  This is being managed topically with Derrek Monaco with modest resolution of symptoms.  However the patient noticed increased itch symptoms in March 2022 and return to see Anderson Malta and Dr. Elonda Husky for further evaluation.  At that time, on 02/11/2021, a white plaque was identified on the right anterior labia minora, consistent with dysplasia.  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  Following the biopsy the patient had significant bleeding that required suture placement.  Approximately 1 week following the procedure she developed hemorrhage from the vulvar lesion where it had been biopsied and was seen in the hospital emergency room and observed overnight.  Hemoglobin was 8, and she did not require a transfusion for her anemia.  Her Eliquis was held at that time and had not been restarted.  Due  to her complex medical history (including bleeding risk with thrombocytopenia and eliquis, she was felt to not be a good candidate for excision, and an ablative procedure was chosen.  Interval Hx:  On 04/04/21 she underwent CO2 laser of the vulva (right labia minora and left lateral clitoral hood). Intraoperative findings were significant for acetowhite plaque measuring 3 x 3 cm on the right anterior labia minora and a 1 x 1 cm acetowhite change to the left side of the clitoral hood. Surgery was uncomplicated.  Since surgery she has done well and denies any pain. She does, however, continue to have vulvar pruritis.    Current Meds:  Outpatient Encounter Medications as of 05/13/2021  Medication Sig  . apixaban (ELIQUIS) 5 MG TABS tablet Take 5 mg by mouth 2 (two) times daily.  Marland Kitchen atorvastatin (LIPITOR) 20 MG tablet Take 20 mg by mouth daily.  . Cholecalciferol 50 MCG (2000 UT) CAPS Take 2,000 mg by mouth daily.  . clobetasol ointment (TEMOVATE) 9.32 % Apply 1 application topically at bedtime.  Marland Kitchen diltiazem (CARDIZEM CD) 120 MG 24 hr capsule Take 1 capsule (120 mg total) by mouth daily.  Marland Kitchen levothyroxine (SYNTHROID) 100 MCG tablet Take 100 mcg by mouth daily before breakfast.  . lisinopril-hydrochlorothiazide (ZESTORETIC) 20-25 MG tablet TAKE (1) TABLET BY MOUTH EACH MORNING.  . nitroGLYCERIN (NITROSTAT) 0.4 MG SL tablet Place 0.4 mg under the tongue every 5 (five) minutes as needed for chest pain. (Patient not taking: Reported on 05/10/2021)  No facility-administered encounter medications on file as of 05/13/2021.    Allergy:  Allergies  Allergen Reactions  . Dicyclomine Other (See Comments)    Dry cough  . Glucophage [Metformin] Diarrhea  . Losartan Other (See Comments)    Heartburn     Social Hx:   Social History   Socioeconomic History  . Marital status: Married    Spouse name: Not on file  . Number of children: 3  . Years of education: Not on file  . Highest education level: Not  on file  Occupational History  . Occupation: retired  Tobacco Use  . Smoking status: Never Smoker  . Smokeless tobacco: Never Used  Vaping Use  . Vaping Use: Never used  Substance and Sexual Activity  . Alcohol use: Not Currently  . Drug use: Never  . Sexual activity: Not Currently    Birth control/protection: Post-menopausal, Surgical  Other Topics Concern  . Not on file  Social History Narrative  . Not on file   Social Determinants of Health   Financial Resource Strain: Low Risk   . Difficulty of Paying Living Expenses: Not hard at all  Food Insecurity: No Food Insecurity  . Worried About Charity fundraiser in the Last Year: Never true  . Ran Out of Food in the Last Year: Never true  Transportation Needs: No Transportation Needs  . Lack of Transportation (Medical): No  . Lack of Transportation (Non-Medical): No  Physical Activity: Inactive  . Days of Exercise per Week: 0 days  . Minutes of Exercise per Session: 0 min  Stress: Stress Concern Present  . Feeling of Stress : To some extent  Social Connections: Moderately Integrated  . Frequency of Communication with Friends and Family: More than three times a week  . Frequency of Social Gatherings with Friends and Family: More than three times a week  . Attends Religious Services: More than 4 times per year  . Active Member of Clubs or Organizations: No  . Attends Archivist Meetings: Never  . Marital Status: Married  Human resources officer Violence: Not At Risk  . Fear of Current or Ex-Partner: No  . Emotionally Abused: No  . Physically Abused: No  . Sexually Abused: No    Past Surgical Hx:  Past Surgical History:  Procedure Laterality Date  . ABDOMINAL HYSTERECTOMY  1989   W/  UNILATERAL SALPINGOOPHORECTOMY  . CATARACT EXTRACTION W/ INTRAOCULAR LENS  IMPLANT, BILATERAL  2016  . CHOLECYSTECTOMY OPEN  1987  . CO2 LASER APPLICATION N/A 9/56/2130   Procedure: CO2 LASER APPLICATION OF THE VULVA;  Surgeon: Everitt Amber, MD;  Location: [facility];  Service: Gynecology;  Laterality: N/A;  . COLONOSCOPY WITH ESOPHAGOGASTRODUODENOSCOPY (EGD)  last one 08/ 2018  @[facility]   . LUMBAR LAMINECTOMY  2009  . PARTIAL MASTECTOMY WITH AXILLARY SENTINEL LYMPH NODE BIOPSY Left 09-15-2012   [facility]    Past Medical Hx:  Past Medical History:  Diagnosis Date  . Anemia due to blood loss    iron infusion's last one 03-25-2021 (pt bleeding post vulvar bx 02-11-2021, hospiatal admission 02-21-2021)  . Anticoagulant long-term use    eliquis--- managed by pcp   . Arthritis    hands  . Bronchiectasis [facility])    pulmonology --- dr Halford Chessman   (04-01-2021  pt stated no supplemental oxygen use since 01/ 2022 and no inhaler use,  stated checks O2 at home during the day 98-99%)  . Chronic constipation   . Coronary  artery calcification    per lexiscan 08-17-2019 result in care everywhere  . DOE (dyspnea on exertion)    04-01-2021  per pt only going up stairs   . History of diabetes mellitus, type II    04-01-2021  per pt no issues after weight loss, last taken medication approx 2017  . History of Graves' disease 2017   s/p RAI  . History of left breast cancer    dx 2013,  s/p left partial mastecotmy w/ node dissection 09-15-2012, low grade DCIS, no chemo, completed radiation 02/ 2014,  per pt no recurrence  . History of respiratory syncytial virus (RSV) infection 09/2020   w/ hypoxia  , hospital admission 10/ 2021  and acute exacerbation bronchiectasis;  follow up post hospital pulmonogy dr sood  note in epic  . Hyperlipidemia   . Hypertension    followed by pcp   (pt had lexiscan in care everywhere dated 08-17-2019 no ischemia , non-obstructive extensive calcification, ef 69%)  . Hypothyroidism   . Hypothyroidism, postradioiodine therapy    followed by pcp  . Liver cirrhosis secondary to NASH (nonalcoholic steatohepatitis) ([place])    followed by dr Merrilee Jansky ([facility])---- dx 2004,  compensated ,   last liver bx (03/ 2015) fibrosis stage 3;  moderate portal hypertensive gastrophy  . PAF (paroxysmal atrial fibrillation) ([place]) previous cardiologist lov note w/ Suann Larry PA ([place] [facility] in [place]) dated 01-24-2020 in care everywhere;  (04-01-2021 per pt has appt w/ new cardiologist , dr j. branch 04-30-2021)   first dx 2020--  had event monitor/ stress test/ echo all results in care everywhere (monitor 08-03-2019 short runs AFib conversion pause <2 secconds, rate controlled;  echo 12-14-2019 ef 60%, mild concentric LVH, G2DD, RSVP 50.59mmHg)  . Personal history of radiation therapy 2013   left breast cancer,  completed 02/ 2014  . PONV (postoperative nausea and vomiting)   . Sarcoidosis, lung [facility]) pulmonology--- dr Halford Chessman   04-01-2021  per pt dx age 59s , no issue since  . Thrombocytopenia [facility])    hematology/ oncology--- dr s. Delton Coombes  . VIN III (vulvar intraepithelial neoplasia III)     Past Gynecological History: Vulvar dystrophy and pruritus No LMP recorded. Patient has had a hysterectomy.  Family Hx:  Family History  Problem Relation Age of Onset  . Hypertension Mother   . Dementia Mother   . Heart disease Father   . Heart disease Brother     Review of Systems:  Constitutional  Feels well,    ENT Normal appearing ears and nares bilaterally Skin/Breast  + Easy bruising Cardiovascular  No chest pain, shortness of breath, or edema  Pulmonary  No cough or wheeze.  Gastro Intestinal  No nausea, vomitting, or diarrhoea. No bright red blood per rectum, no abdominal pain, change in bowel movement, or constipation.  Genito Urinary  No frequency, urgency, dysuria,  Musculo Skeletal  No myalgia, arthralgia, joint swelling or pain  Neurologic  No weakness, numbness, change in gait,  Psychology  No depression, anxiety, insomnia.   Vitals:  Blood pressure 120/62, pulse 61, temperature 97.6 F (36.4 C), temperature source Tympanic, resp. rate 16, height 5\' 4"   (1.626 m), weight 131 lb 12.8 oz (59.8 kg), SpO2 100 %.  Physical Exam: WD in NAD Neck  Supple NROM, without any enlargements.  Lymph Node Survey No cervical supraclavicular or inguinal adenopathy Cardiovascular  Warm well perfused peripheries, irregular heart rate Lungs  No increased WOB Skin  Wide  spread echymoses Psychiatry  Alert and oriented to person, place, and time  Abdomen  Normoactive bowel sounds, abdomen soft, non-tender and nonobese without evidence of hernia.  Back No CVA tenderness Genito Urinary  Vulva/vagina:  Excoriation, erythema, denuded skin consistent with vulvar dystrophy covering mons, labia majora, labiocrural folds. Well healed laser sites. No residual visible dysplasia. Rectal  deferred Extremities  No bilateral cyanosis, clubbing or edema.   Thereasa Solo, MD  05/13/2021, 2:51 PM

## 2021-05-13 NOTE — Patient Instructions (Signed)
Dr Denman George recommends applying the clobetasol ointment to the skin of the vulva where it is itchy. Once per night, for 2 months.   Dr Denman George recommends that you follow-up with Dr Elonda Husky in 6 months to check the skin of the vulva.

## 2021-05-15 ENCOUNTER — Telehealth: Payer: Self-pay | Admitting: Cardiology

## 2021-05-15 ENCOUNTER — Encounter (HOSPITAL_COMMUNITY): Payer: Self-pay | Admitting: Hematology

## 2021-05-15 MED ORDER — ELIQUIS 5 MG PO TABS: 5.0000 mg | ORAL_TABLET | Freq: Two times a day (BID) | ORAL | 1 refills | Status: DC

## 2021-05-15 NOTE — Telephone Encounter (Signed)
New message      *STAT* If patient is at the pharmacy, call can be transferred to refill team.   1. Which medications need to be refilled? (please list name of each medication and dose if known)  eliquis   2. Which pharmacy/location (including street and city if local pharmacy) is medication to be sent to? [place]   3. Do they need a 30 day or 90 day supply?  [place]

## 2021-05-15 NOTE — Progress Notes (Signed)
Tasha Roberts, Thanks!!  [employer]

## 2021-05-15 NOTE — Telephone Encounter (Signed)
Prescription refill request for Eliquis received. Indication: Atrial fib Last office visit: 04/30/21 / Branch Scr: 0.92 on 04/04/21 Age: 80 Weight:59.5  Based on above findings Eliquis 5mg  twice daily is the appropriate dose.  Refill approved.

## 2021-06-05 ENCOUNTER — Ambulatory Visit: Payer: Medicare Other | Admitting: Internal Medicine

## 2021-06-12 ENCOUNTER — Ambulatory Visit
Admission: EM | Admit: 2021-06-12 | Discharge: 2021-06-12 | Disposition: A | Discharge status: Home / Self Care | Payer: Medicare Other | Attending: Family Medicine | Admitting: Family Medicine

## 2021-06-12 ENCOUNTER — Other Ambulatory Visit: Payer: Self-pay

## 2021-06-12 ENCOUNTER — Encounter (HOSPITAL_COMMUNITY): Payer: Self-pay | Admitting: Hematology

## 2021-06-12 DIAGNOSIS — B029 Zoster without complications: Secondary | ICD-10-CM

## 2021-06-12 MED ORDER — VALACYCLOVIR HCL 1 G PO TABS: 1000.0000 mg | ORAL_TABLET | Freq: Three times a day (TID) | ORAL | 0 refills | Status: DC

## 2021-06-12 NOTE — ED Triage Notes (Signed)
Pt presents with rash to left groin that she noticed yesterday

## 2021-06-13 NOTE — ED Provider Notes (Signed)
[place]   [id number] 06/12/21 Arrival Time: 3662  ASSESSMENT & PLAN:  1. Herpes zoster without complication    No sign of bacterial skin infection.  Begin: Meds ordered this encounter  Medications   valACYclovir (VALTREX) 1000 MG tablet    Sig: Take 1 tablet (1,000 mg total) by mouth 3 (three) times daily.    Dispense:  21 tablet    Refill:  0   OTC analgesics as needed. Will follow up with PCP or here if worsening or failing to improve as anticipated. Reviewed expectations re: course of current medical issues. Questions answered. Outlined signs and symptoms indicating need for more acute intervention. Patient verbalized understanding. After Visit Summary given.   SUBJECTIVE:  Tasha Roberts is a 80 y.o. female who presents with a skin complaint. Left inguinal/groin region. Some pain but tolerating well. Afebrile. No recent illnesses. No h/o similar.     OBJECTIVE: Vitals:   06/12/21 1504  BP: 121/65  Pulse: 90  Resp: 16  Temp: (!) 97.5 F (36.4 C)  TempSrc: Oral  SpO2: 97%    General appearance: alert; no distress HEENT: [place]; AT Neck: supple with FROM Lungs: unlabored Extremities: no edema; moves all extremities normally Skin: warm and dry; crops of red/purplish papules over L groin/inguinal distribution; some crusting; mild TTP; no inguinal LAD Psychological: alert and cooperative; normal mood and affect  Allergies  Allergen Reactions   Dicyclomine Other (See Comments)    Dry cough   Glucophage [Metformin] Diarrhea   Losartan Other (See Comments)    Heartburn     Past Medical History:  Diagnosis Date   Anemia due to blood loss    iron infusion's last one 03-25-2021 (pt bleeding post vulvar bx 02-11-2021, hospiatal admission 02-21-2021)   Anticoagulant long-term use    eliquis--- managed by pcp    Arthritis    hands   Bronchiectasis [facility])    pulmonology --- dr Halford Chessman   (04-01-2021  pt stated no supplemental oxygen use since 01/ 2022 and  no inhaler use,  stated checks O2 at home during the day 98-99%)   Chronic constipation    Coronary artery calcification    per lexiscan 08-17-2019 result in care everywhere   DOE (dyspnea on exertion)    04-01-2021  per pt only going up stairs    History of diabetes mellitus, type II    04-01-2021  per pt no issues after weight loss, last taken medication approx 2017   History of Graves' disease 2017   s/p RAI   History of left breast cancer    dx 2013,  s/p left partial mastecotmy w/ node dissection 09-15-2012, low grade DCIS, no chemo, completed radiation 02/ 2014,  per pt no recurrence   History of respiratory syncytial virus (RSV) infection 09/2020   w/ hypoxia  , hospital admission 10/ 2021  and acute exacerbation bronchiectasis;  follow up post hospital pulmonogy dr Halford Chessman  note in epic   Hyperlipidemia    Hypertension    followed by pcp   (pt had lexiscan in care everywhere dated 08-17-2019 no ischemia , non-obstructive extensive calcification, ef 69%)   Hypothyroidism    Hypothyroidism, postradioiodine therapy    followed by pcp   Liver cirrhosis secondary to NASH (nonalcoholic steatohepatitis) ([place])    followed by dr Merrilee Jansky ([facility])---- dx 2004,  compensated ,  last liver bx (03/ 2015) fibrosis stage 3;  moderate portal hypertensive gastrophy   PAF (paroxysmal atrial fibrillation) ([place]) previous  cardiologist lov note w/ Suann Larry PA ([place] [facility] in [place]) dated 01-24-2020 in care everywhere;  (04-01-2021 per pt has appt w/ new cardiologist , dr j. branch 04-30-2021)   first dx 2020--  had event monitor/ stress test/ echo all results in care everywhere (monitor 08-03-2019 short runs AFib conversion pause <2 secconds, rate controlled;  echo 12-14-2019 ef 60%, mild concentric LVH, G2DD, RSVP 50.24mmHg)   Personal history of radiation therapy 2013   left breast cancer,  completed 02/ 2014   PONV (postoperative nausea and vomiting)    Sarcoidosis, lung  [facility]) pulmonology--- dr Halford Chessman   04-01-2021  per pt dx age 56s , no issue since   Thrombocytopenia [facility])    hematology/ oncology--- dr s. Delton Coombes   VIN III (vulvar intraepithelial neoplasia III)    Social History   Socioeconomic History   Marital status: Widowed    Spouse name: Not on file   Number of children: 3   Years of education: Not on file   Highest education level: Not on file  Occupational History   Occupation: retired  Tobacco Use   Smoking status: Never   Smokeless tobacco: Never  Vaping Use   Vaping Use: Never used  Substance and Sexual Activity   Alcohol use: Not Currently   Drug use: Never   Sexual activity: Not Currently    Birth control/protection: Post-menopausal, Surgical  Other Topics Concern   Not on file  Social History Narrative   Not on file   Social Determinants of Health   Financial Resource Strain: Low Risk    Difficulty of Paying Living Expenses: Not hard at all  Food Insecurity: No Food Insecurity   Worried About Charity fundraiser in the Last Year: Never true   [place] in the Last Year: Never true  Transportation Needs: No Transportation Needs   Lack of Transportation (Medical): No   Lack of Transportation (Non-Medical): No  Physical Activity: Inactive   Days of Exercise per Week: 0 days   Minutes of Exercise per Session: 0 min  Stress: Stress Concern Present   Feeling of Stress : To some extent  Social Connections: Moderately Integrated   Frequency of Communication with Friends and Family: More than three times a week   Frequency of Social Gatherings with Friends and Family: More than three times a week   Attends Religious Services: More than 4 times per year   Active Member of [employer] or Organizations: No   Attends Archivist Meetings: Never   Marital Status: Married  Human resources officer Violence: Not At Risk   Fear of Current or Ex-Partner: No   Emotionally Abused: No   Physically Abused: No   Sexually Abused: No    Family History  Problem Relation Age of Onset   Hypertension Mother    Dementia Mother    Heart disease Father    Heart disease Brother    Past Surgical History:  Procedure Laterality Date   ABDOMINAL HYSTERECTOMY  1989   W/  UNILATERAL SALPINGOOPHORECTOMY   CATARACT EXTRACTION W/ INTRAOCULAR LENS  IMPLANT, BILATERAL  2016   CHOLECYSTECTOMY OPEN  2263   CO2 LASER APPLICATION N/A 3/35/4562   Procedure: CO2 LASER APPLICATION OF THE VULVA;  Surgeon: Everitt Amber, MD;  Location: [place];  Service: Gynecology;  Laterality: N/A;   COLONOSCOPY WITH ESOPHAGOGASTRODUODENOSCOPY (EGD)  last one 08/ 2018  @[facility]    LUMBAR LAMINECTOMY  2009   PARTIAL MASTECTOMY WITH AXILLARY SENTINEL  LYMPH NODE BIOPSY Left 09-15-2012   [facility]      [place], Aaron [place], [place] 06/13/21 [phone]

## 2021-06-26 ENCOUNTER — Ambulatory Visit: Payer: Medicare Other | Admitting: Internal Medicine

## 2021-07-23 ENCOUNTER — Encounter (HOSPITAL_COMMUNITY): Payer: Self-pay | Admitting: Hematology

## 2021-07-30 ENCOUNTER — Inpatient Hospital Stay (HOSPITAL_COMMUNITY): Payer: Medicare Other

## 2021-07-31 ENCOUNTER — Encounter: Payer: Self-pay | Admitting: Internal Medicine

## 2021-07-31 ENCOUNTER — Other Ambulatory Visit: Payer: Self-pay

## 2021-07-31 ENCOUNTER — Ambulatory Visit (INDEPENDENT_AMBULATORY_CARE_PROVIDER_SITE_OTHER): Payer: Medicare Other | Admitting: Internal Medicine

## 2021-07-31 VITALS — BP 144/67 | HR 100 | Ht 63.0 in | Wt 132.0 lb

## 2021-07-31 DIAGNOSIS — Z7689 Persons encountering health services in other specified circumstances: Secondary | ICD-10-CM

## 2021-07-31 DIAGNOSIS — D500 Iron deficiency anemia secondary to blood loss (chronic): Secondary | ICD-10-CM

## 2021-07-31 DIAGNOSIS — K7581 Nonalcoholic steatohepatitis (NASH): Secondary | ICD-10-CM | POA: Diagnosis not present

## 2021-07-31 DIAGNOSIS — K7460 Unspecified cirrhosis of liver: Secondary | ICD-10-CM

## 2021-07-31 DIAGNOSIS — I2510 Atherosclerotic heart disease of native coronary artery without angina pectoris: Secondary | ICD-10-CM

## 2021-07-31 DIAGNOSIS — E039 Hypothyroidism, unspecified: Secondary | ICD-10-CM

## 2021-07-31 DIAGNOSIS — I10 Essential (primary) hypertension: Secondary | ICD-10-CM | POA: Diagnosis not present

## 2021-07-31 DIAGNOSIS — I480 Paroxysmal atrial fibrillation: Secondary | ICD-10-CM | POA: Diagnosis not present

## 2021-07-31 DIAGNOSIS — D696 Thrombocytopenia, unspecified: Secondary | ICD-10-CM

## 2021-07-31 DIAGNOSIS — M810 Age-related osteoporosis without current pathological fracture: Secondary | ICD-10-CM | POA: Insufficient documentation

## 2021-07-31 DIAGNOSIS — R0989 Other specified symptoms and signs involving the circulatory and respiratory systems: Secondary | ICD-10-CM

## 2021-07-31 DIAGNOSIS — K529 Noninfective gastroenteritis and colitis, unspecified: Secondary | ICD-10-CM

## 2021-07-31 MED ORDER — NITROGLYCERIN 0.4 MG SL SUBL: 0.4000 mg | SUBLINGUAL_TABLET | SUBLINGUAL | 0 refills | Status: DC | PRN

## 2021-07-31 NOTE — Assessment & Plan Note (Signed)
Had DEXA scan in 2006 On vitamin D supplements Check DEXA scan

## 2021-07-31 NOTE — Assessment & Plan Note (Signed)
Loose BM with mucus and reports thin caliber BM Advised to discuss with GI as symptoms are concerning for obstruction/inflammatory colitis Recent CT scan of abdomen was unremarkable according to her

## 2021-07-31 NOTE — Assessment & Plan Note (Signed)
On Cardizem and Eliquis °Followed by cardiology °In sinus rhythm currently °

## 2021-07-31 NOTE — Assessment & Plan Note (Signed)
Last CBC and iron panel reviewed Receives iron infusions Followed by Heme/Onc.

## 2021-07-31 NOTE — Assessment & Plan Note (Signed)
Care established History and medications reviewed with the patient 

## 2021-07-31 NOTE — Patient Instructions (Signed)
Please continue taking medications as prescribed.  Please contact your GI Physician for discussing about loose bowel movements with mucus and increased frequency.  You are being scheduled to get carotid ultrasound and DEXA scan.  Please get Shingrix vaccine at your local Pharmacy.

## 2021-07-31 NOTE — Assessment & Plan Note (Signed)
Plt stable She has chronic bruising

## 2021-07-31 NOTE — Assessment & Plan Note (Signed)
BP Readings from Last 1 Encounters:  07/31/21 (!) 144/67   Overall well-controlled for her age 80 for compliance with the medications Advised DASH diet and moderate exercise/walking, at least 150 mins/week

## 2021-07-31 NOTE — Assessment & Plan Note (Addendum)
Followed by GI/liver clinic On Ursodiol

## 2021-07-31 NOTE — Assessment & Plan Note (Addendum)
Remote history of angina Keeps nitroglycerin as needed, refilled On statin

## 2021-07-31 NOTE — Progress Notes (Signed)
New Patient Office Visit  Subjective:  Patient ID: Tasha Roberts, female    DOB: 10/24/1941  Age: 80 y.o. MRN: [id number]  CC:  Chief Complaint  Patient presents with   New Patient (Initial Visit)    Here to establish care. Has abdominal cramps and diarrhea intermittently for the past 2 years.    HPI Tasha Roberts is a 80 year old female with PMH of HTN, paroxysmal A Fib, NASH, hypothyroidism, breast ca s/p left mastectomy, iron deficiency anemia, thrombocytopenia, osteoporosis and chronic loose BM who presents for establishing care.  BP is well-controlled overall. Takes medications regularly. Patient denies headache, dizziness, chest pain, dyspnea or palpitations.  She takes Cardizem and Eliquis for PAF.  She follows up with cardiology.  She has been having diarrhea for almost 2 years. She has noticed decreased caliber of stool and mucus in stool. She sees GI/liver clinic for NASH related liver cirrhosis. She had last colonoscopy about 3 years ago.  She has h/o IDA and thrombocytopenia, for which she sees hematology and receives iron transfusions.  She complains of chronic bruising over her bilateral LE.  She has a history of osteoporosis, last DEXA scan in 2006.  She is on vitamin D supplements.  She is up-to-date with COVID vaccination.  She has had pneumococcal vaccines as well.  Past Medical History:  Diagnosis Date   Anemia due to blood loss    iron infusion's last one 03-25-2021 (pt bleeding post vulvar bx 02-11-2021, hospiatal admission 02-21-2021)   Anticoagulant long-term use    eliquis--- managed by pcp    Arthritis    hands   Bronchiectasis [facility]    pulmonology --- dr Halford Chessman   (04-01-2021  pt stated no supplemental oxygen use since 01/ 2022 and no inhaler use,  stated checks O2 at home during the day 98-99%)   Chronic constipation    Coronary artery calcification    per lexiscan 08-17-2019 result in care everywhere   DOE (dyspnea on exertion)    04-01-2021  per pt only  going up stairs    History of diabetes mellitus, type II    04-01-2021  per pt no issues after weight loss, last taken medication approx 2017   History of Graves' disease 2017   s/p RAI   History of left breast cancer    dx 2013,  s/p left partial mastecotmy w/ node dissection 09-15-2012, low grade DCIS, 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, completed radiation 02/ 2014,  per pt no recurrence   History of respiratory syncytial virus (RSV) infection 09/2020   w/ hypoxia  , hospital admission 10/ 2021  and acute exacerbation bronchiectasis;  follow up post hospital pulmonogy dr Halford Chessman  note in epic   Hyperlipidemia    Hypertension    followed by pcp   (pt had lexiscan in care everywhere dated 08-17-2019 no ischemia , non-obstructive extensive calcification, ef 69%)   Hypothyroidism    Hypothyroidism, postradioiodine therapy    followed by pcp   Liver cirrhosis secondary to NASH (nonalcoholic steatohepatitis) ([place])    followed by dr Merrilee Jansky ([facility])---- dx 2004,  compensated ,  last liver bx (03/ 2015) fibrosis stage 3;  moderate portal hypertensive gastrophy   PAF (paroxysmal atrial fibrillation) ([place]) previous cardiologist lov note w/ Suann Larry PA ([facility] in [place]) dated 01-24-2020 in care everywhere;  (04-01-2021 per pt has appt w/ new cardiologist , dr j. 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 Delton Coombes   VIN III (vulvar intraepithelial neoplasia III)     Past Surgical History:   Procedure Laterality Date   ABDOMINAL HYSTERECTOMY  1989   W/  UNILATERAL SALPINGOOPHORECTOMY   CATARACT EXTRACTION W/ INTRAOCULAR LENS  IMPLANT, BILATERAL  2016   CHOLECYSTECTOMY OPEN  3875   CO2 LASER APPLICATION N/A 6/43/3295   Procedure: CO2 LASER APPLICATION OF THE VULVA;  Surgeon: Everitt Amber, MD;  Location: [place];  Service: Gynecology;  Laterality: N/A;   COLONOSCOPY WITH ESOPHAGOGASTRODUODENOSCOPY (EGD)  last one 08/ 2018  [facility]    LUMBAR LAMINECTOMY  2009   PARTIAL MASTECTOMY WITH AXILLARY SENTINEL LYMPH NODE BIOPSY Left 09-15-2012   [facility]    Family History  Problem Relation Age of Onset   Hypertension Mother    Dementia Mother    Heart disease Father    Heart disease Brother     Social History   Socioeconomic History   Marital status: Widowed    Spouse name: Not on file   Number of children: 3   Years of education: Not on file   Highest education level: Not on file  Occupational History   Occupation: retired  Tobacco Use   Smoking status: Never   Smokeless tobacco: Never  Vaping Use   Vaping Use: Never used  Substance and Sexual Activity   Alcohol use: Not Currently   Drug use: Never   Sexual activity: Not Currently    Birth control/protection: Post-menopausal, Surgical  Other Topics Concern   Not on file  Social History Narrative   Not on file   Social Determinants of Health   Financial Resource Strain: Low Risk    Difficulty of Paying Living Expenses: Not hard at all  Food Insecurity: No Food Insecurity   Worried About Charity fundraiser in the Last Year: Never true   [place] in the Last Year: Never true  Transportation Needs: No Transportation Needs   Lack of Transportation (Medical): No   Lack of Transportation (Non-Medical): No  Physical Activity: Inactive   Days of Exercise per Week: 0 days   Minutes of Exercise per Session: 0 min  Stress: Stress Concern Present   Feeling of Stress : To some extent  Social  Connections: Moderately Integrated   Frequency of Communication with Friends and Family: More than three times a week   Frequency of Social Gatherings with Friends and Family: More than three times a week   Attends Religious Services: More than 4 times per year   Active Member of [employer] or Organizations: No   Attends Archivist Meetings: Never   Marital Status: Married  Human resources officer Violence: Not At Risk   Fear of Current or Ex-Partner: No   Emotionally Abused: No   Physically Abused: No   Sexually Abused: No    ROS Review of Systems  Constitutional:  Negative for chills and fever.  HENT:  Negative for congestion, sinus pressure, sinus pain and sore throat.   Eyes:  Negative for  pain and discharge.  Respiratory:  Negative for cough and shortness of breath.   Cardiovascular:  Negative for chest pain and palpitations.  Gastrointestinal:  Positive for abdominal pain and diarrhea. Negative for constipation, nausea and vomiting.  Endocrine: Negative for polydipsia and polyuria.  Genitourinary:  Negative for dysuria and hematuria.  Musculoskeletal:  Positive for back pain. Negative for neck pain and neck stiffness.  Skin:  Negative for rash.  Neurological:  Negative for dizziness and weakness.  Psychiatric/Behavioral:  Negative for agitation and behavioral problems.    Objective:   Today's Vitals: BP (!) 144/67 (BP Location: Right Arm, Patient Position: Sitting, Cuff Size: Large)   Pulse 100   Ht $R'5\' 3"'Jf$  (1.6 m)   Wt 132 lb (59.9 kg)   SpO2 97%   BMI 23.38 kg/m   Physical Exam Vitals reviewed.  Constitutional:      General: She is not in acute distress.    Appearance: She is not diaphoretic.  HENT:     Head: Normocephalic and atraumatic.     Nose: Nose normal.     Mouth/Throat:     Mouth: Mucous membranes are moist.  Eyes:     General: No scleral icterus.    Extraocular Movements: Extraocular movements intact.  Neck:     Vascular: Carotid bruit present.   Cardiovascular:     Rate and Rhythm: Normal rate and regular rhythm.     Pulses: Normal pulses.     Heart sounds: Normal heart sounds. No murmur heard. Pulmonary:     Breath sounds: Normal breath sounds. No wheezing or rales.  Abdominal:     Palpations: Abdomen is soft.     Tenderness: There is no abdominal tenderness.  Musculoskeletal:     Cervical back: Neck supple. No tenderness.     Right lower leg: No edema.     Left lower leg: No edema.  Skin:    General: Skin is warm.     Findings: No rash.  Neurological:     General: No focal deficit present.     Mental Status: She is alert and oriented to person, place, and time.     Sensory: No sensory deficit.     Motor: No weakness.  Psychiatric:        Mood and Affect: Mood normal.        Behavior: Behavior normal.    Assessment & Plan:   Problem List Items Addressed This Visit       Cardiovascular and Mediastinum   CAD (coronary artery disease) (Chronic)    Remote history of angina Keeps nitroglycerin as needed, refilled On statin      Relevant Medications   nitroGLYCERIN (NITROSTAT) 0.4 MG SL tablet   AF (paroxysmal atrial fibrillation) (HCC)    On Cardizem and Eliquis Followed by cardiology In sinus rhythm currently      Relevant Medications   nitroGLYCERIN (NITROSTAT) 0.4 MG SL tablet   Other Relevant Orders   CMP14+EGFR   Essential hypertension    BP Readings from Last 1 Encounters:  07/31/21 (!) 144/67  Overall well-controlled for her age 6 for compliance with the medications Advised DASH diet and moderate exercise/walking, at least 150 mins/week      Relevant Medications   nitroGLYCERIN (NITROSTAT) 0.4 MG SL tablet     Digestive   Liver cirrhosis secondary to NASH ([place])    Followed by GI/liver clinic On Ursodiol      Relevant Orders   CMP14+EGFR   CBC with Differential/Platelet  Chronic diarrhea    Loose BM with mucus and reports thin caliber BM Advised to discuss with GI as  symptoms are concerning for obstruction/inflammatory colitis Recent CT scan of abdomen was unremarkable according to her        Endocrine   Acquired hypothyroidism    Check TSH and free T4 as patient complains of intermittent diarrhea On Levothyroxine      Relevant Orders   TSH + free T4     Musculoskeletal and Integument   Age related osteoporosis    Had DEXA scan in 2006 On vitamin D supplements Check DEXA scan      Relevant Orders   DG Bone Density     Other   Iron deficiency anemia due to chronic blood loss    Last CBC and iron panel reviewed Receives iron infusions Followed by Heme/Onc.      Relevant Orders   Fe+TIBC+Fer   Thrombocytopenia (HCC)    Plt stable She has chronic bruising      Relevant Orders   CBC with Differential/Platelet   Encounter to establish care - Primary    Care established History and medications reviewed with the patient      Other Visit Diagnoses     Bilateral carotid bruits       Relevant Orders   US Carotid Duplex Bilateral       Outpatient Encounter Medications as of 07/31/2021  Medication Sig   apixaban (ELIQUIS) 5 MG TABS tablet Take 1 tablet (5 mg total) by mouth 2 (two) times daily.   atorvastatin (LIPITOR) 20 MG tablet Take 20 mg by mouth daily.   Cholecalciferol 50 MCG (2000 UT) CAPS Take 2,000 mg by mouth daily.   diltiazem (CARDIZEM CD) 120 MG 24 hr capsule Take 1 capsule (120 mg total) by mouth daily.   levothyroxine (SYNTHROID) 100 MCG tablet Take 100 mcg by mouth daily before breakfast.   lisinopril-hydrochlorothiazide (ZESTORETIC) 20-25 MG tablet TAKE (1) TABLET BY MOUTH EACH MORNING.   ursodiol (ACTIGALL) 250 MG tablet Take 250 mg by mouth 2 (two) times daily.   [DISCONTINUED] nitroGLYCERIN (NITROSTAT) 0.4 MG SL tablet Place 0.4 mg under the tongue every 5 (five) minutes as needed for chest pain.   nitroGLYCERIN (NITROSTAT) 0.4 MG SL tablet Place 1 tablet (0.4 mg total) under the tongue every 5 (five)  minutes as needed for chest pain.   [DISCONTINUED] valACYclovir (VALTREX) 1000 MG tablet Take 1 tablet (1,000 mg total) by mouth 3 (three) times daily.   No facility-administered encounter medications on file as of 07/31/2021.    Follow-up: Return in about 4 months (around 11/30/2021) for Hypothyroidism and HTN.   Lindell Spar, MD

## 2021-07-31 NOTE — Assessment & Plan Note (Signed)
Check TSH and free T4 as patient complains of intermittent diarrhea On Levothyroxine

## 2021-08-01 ENCOUNTER — Encounter (HOSPITAL_COMMUNITY): Payer: Self-pay | Admitting: *Deleted

## 2021-08-01 LAB — IRON,TIBC AND FERRITIN PANEL
Ferritin: 345 ng/mL — ABNORMAL HIGH (ref 15–150)
Iron Saturation: 43 % (ref 15–55)
Iron: 106 ug/dL (ref 27–139)
Total Iron Binding Capacity: 247 ug/dL — ABNORMAL LOW (ref 250–450)
UIBC: 141 ug/dL (ref 118–369)

## 2021-08-01 LAB — CMP14+EGFR
ALT: 15 IU/L (ref 0–32)
AST: 23 IU/L (ref 0–40)
Albumin/Globulin Ratio: 2.4 — ABNORMAL HIGH (ref 1.2–2.2)
Albumin: 4.3 g/dL (ref 3.7–4.7)
Alkaline Phosphatase: 80 IU/L (ref 44–121)
BUN/Creatinine Ratio: 20 (ref 12–28)
BUN: 20 mg/dL (ref 8–27)
Bilirubin Total: 0.7 mg/dL (ref 0.0–1.2)
CO2: 23 mmol/L (ref 20–29)
Calcium: 9.6 mg/dL (ref 8.7–10.3)
Chloride: 105 mmol/L (ref 96–106)
Creatinine, Ser: 0.98 mg/dL (ref 0.57–1.00)
Globulin, Total: 1.8 g/dL (ref 1.5–4.5)
Glucose: 97 mg/dL (ref 65–99)
Potassium: 3.8 mmol/L (ref 3.5–5.2)
Sodium: 143 mmol/L (ref 134–144)
Total Protein: 6.1 g/dL (ref 6.0–8.5)
eGFR: 59 mL/min/{1.73_m2} — ABNORMAL LOW (ref >59)

## 2021-08-01 LAB — CBC WITH DIFFERENTIAL/PLATELET
Basophils Absolute: 0 10*3/uL (ref 0.0–0.2)
Basos: 1 %
EOS (ABSOLUTE): 0.1 10*3/uL (ref 0.0–0.4)
Eos: 2 %
Hematocrit: 36.7 % (ref 34.0–46.6)
Hemoglobin: 12.9 g/dL (ref 11.1–15.9)
Immature Grans (Abs): 0 10*3/uL (ref 0.0–0.1)
Immature Granulocytes: 0 %
Lymphocytes Absolute: 1.1 10*3/uL (ref 0.7–3.1)
Lymphs: 20 %
MCH: 31.5 pg (ref 26.6–33.0)
MCHC: 35.1 g/dL (ref 31.5–35.7)
MCV: 90 fL (ref 79–97)
Monocytes Absolute: 0.5 10*3/uL (ref 0.1–0.9)
Monocytes: 9 %
Neutrophils Absolute: 4.1 10*3/uL (ref 1.4–7.0)
Neutrophils: 68 %
Platelets: 133 10*3/uL — ABNORMAL LOW (ref 150–450)
RBC: 4.09 x10E6/uL (ref 3.77–5.28)
RDW: 14 % (ref 11.7–15.4)
WBC: 5.8 10*3/uL (ref 3.4–10.8)

## 2021-08-01 LAB — TSH+FREE T4
Free T4: 1.61 ng/dL (ref 0.82–1.77)
TSH: 0.771 u[IU]/mL (ref 0.450–4.500)

## 2021-08-02 ENCOUNTER — Other Ambulatory Visit (HOSPITAL_COMMUNITY): Payer: Medicare Other

## 2021-08-02 ENCOUNTER — Encounter (HOSPITAL_COMMUNITY): Payer: Self-pay

## 2021-08-05 ENCOUNTER — Other Ambulatory Visit: Payer: Self-pay | Admitting: *Deleted

## 2021-08-05 ENCOUNTER — Ambulatory Visit (HOSPITAL_COMMUNITY)
Admission: RE | Admit: 2021-08-05 | Discharge: 2021-08-05 | Disposition: A | Discharge status: Home / Self Care | Payer: Medicare Other | Source: Ambulatory Visit | Attending: Internal Medicine | Admitting: Internal Medicine

## 2021-08-05 ENCOUNTER — Other Ambulatory Visit: Payer: Self-pay

## 2021-08-05 ENCOUNTER — Telehealth: Payer: Self-pay

## 2021-08-05 DIAGNOSIS — R0989 Other specified symptoms and signs involving the circulatory and respiratory systems: Secondary | ICD-10-CM | POA: Insufficient documentation

## 2021-08-05 MED ORDER — LEVOTHYROXINE SODIUM 100 MCG PO TABS: 100.0000 ug | ORAL_TABLET | Freq: Every day | ORAL | 1 refills | Status: DC

## 2021-08-05 MED ORDER — LISINOPRIL-HYDROCHLOROTHIAZIDE 20-25 MG PO TABS: ORAL_TABLET | ORAL | 1 refills | Status: DC

## 2021-08-05 NOTE — Telephone Encounter (Signed)
Patient called need med refill, request 90 day supply.  levothyroxine (SYNTHROID) 100 MCG tablet [[id number]]   lisinopril-hydrochlorothiazide (ZESTORETIC) 20-25 MG tablet [[id number]]  Ph[facility]: [facility]

## 2021-08-05 NOTE — Telephone Encounter (Signed)
Pt medication sent to pharmacy  

## 2021-08-06 ENCOUNTER — Ambulatory Visit (HOSPITAL_COMMUNITY): Payer: Medicare Other | Admitting: Physician Assistant

## 2021-08-07 NOTE — Progress Notes (Signed)
Virtual Visit via Telephone Note [facility]  I connected with Tasha Roberts  on 08/08/21 at  2:23 PM by telephone and verified that I am speaking with the correct person using two identifiers.  Location: Patient: Home Provider: [facility]   I discussed the limitations, risks, security and privacy concerns of performing an evaluation and management service by telephone and the availability of in person appointments. I also discussed with the patient that there may be a patient responsible charge related to this service. The patient expressed understanding and agreed to proceed.   HISTORY OF PRESENT ILLNESS: [name] 80 y.o. female followed in our clinic for iron deficiency anemia from chronic GI bleeding and thrombocytopenia in the setting of NASH cirrhosis and splenomegaly.  She was last evaluated via telemedicine visit by Tarri Abernethy PA-C on 04/25/2021.  At today's visit, she reports feeling fairly well.  She denies any recent denies any recent infections, hospitalizations, new diagnoses, or changes in baseline status.  She does have intermittent melena, reports that her last episode of black tarry stools was about 2 months ago; no other bleeding such as hematochezia, hematemesis, or epistaxis.  She recently had an EGD at [facility] in July 2022 which showed normal esophagus, stomach, and duodenum.  She admits to easy bruising but denies petechial rash.  No B symptoms such as fever, chills, night sweats, unintentional weight loss.  She has stable fatigue, which is chronic and at baseline.  No chest pain, dyspnea on exertion, syncope, or palpitations.  Her only complaint is abdominal pain, and she is seeing her gastroenterologist regarding this.  She reports 30% appetite and 50% energy.  She is maintaining a stable weight at this time.    OBSERVATIONS/OBJECTIVE: Review of Systems  Constitutional:  Positive for malaise/fatigue. Negative for chills,  diaphoresis, fever and weight loss.  Respiratory:  Negative for cough and shortness of breath.   Cardiovascular:  Negative for chest pain and palpitations.  Gastrointestinal:  Positive for abdominal pain and melena (occasional). Negative for blood in stool, nausea and vomiting.  Neurological:  Negative for dizziness and headaches.    PHYSICAL EXAM (per limitations of virtual telephone visit): The patient is alert and oriented x 3, exhibiting adequate mentation, good mood, and ability to speak in full sentences and execute sound judgement.   ASSESSMENT & PLAN: 1.  Thrombocytopenia, mild to moderate - Ongoing problem since at least 2016 (platelets 74) - has been ranging from mild to moderate thrombocytopenia for the past 5 years - Likely secondary to NASH cirrhosis and splenomegaly - CT abdomen ([facility]/30/[facility]) showed cirrhosis of the liver without evidence of HCC; splenomegaly and small caliber upper abdominal venous collaterals were noted - Work-up to rule out other causes of thrombocytopenia was unremarkable:  Vitamin D normal at 57.86, copper slightly decreased at 77, methylmalonic acid normal 298; hepatitis panel and HIV nonreactive ANA and rheumatoid factor normal. Recently checked (8/65/7846) folic acid and N62 were within normal  - Most recent labs (07/31/2021): Platelets 133 - Admits to easy bruising, but denies petechial rash; no major bleeding events - PLAN: No indication for treatment at this time.  Continue to monitor with repeat CBC and RTC in 4 months.  2.  Normocytic anemia - Likely secondary to longstanding chronic disease, occult GI blood loss, and recent postoperative bleeding after vulvar biopsy while on Eliquis - Reports intermittent melena for the past 2 to 3 years, last episode 2 months ago - Most recent  EGD (06/11/2021 at [facility]) showed normal stomach, esophagus, and duodenum - Episode of blood loss from postop bleeding in 2021/03/14 following vulvar biopsy (02/11/2021) -  resolved after laser cauterization - No history of blood transfusions - She is on Eliquis for atrial fibrillation - Work-up for other sources of anemia was negative - normal SPEP, normal light chain ratio, normal haptoglobin, appropriately elevated erythropoietin - Received Feraheme x2 (03/15/2021 and 03/25/2021) - Most recent labs (07/31/2021): Hgb 12.9 with MCV 90, ferritin 345, iron saturation 43%, creatinine 0.98 - PLAN: No current anemia or iron deficiency.  Continue to monitor with repeat labs and RTC in 4 months.  3.  Left breast DCIS: - Lumpectomy on 09/15/2012-DCIS 3 x 2 cm, negative for invasive malignancy, 0/2 sentinel lymph nodes positive.  ER/PR positive. - Completed XRT on 01/23/2013. - She does not recollect taking any tamoxifen or anastrazole - Bilateral diagnostic mammogram with left ultrasound on 08/02/2019 was benign. - Previously followed with the breast care center at [facility] in [place], [place] - recently relocated to [facility].  (External notes from NP Johnsie Kindred reviewed by me) - Mammogram (03/15/2021) was BI-RADS Category 2, benign, no mammographic evidence of malignancy - PLAN: She is due for repeat mammogram next year (April 2023), which can be ordered either by our clinic or by her PCP, per patient preference.  4.  Social history -Patient is a lifelong non-smoker.  No tobacco or illicit drug use. -She ambulates with a Rollator due to generalized weakness. -She currently lives at home with her daughter.  Patient's husband recently passed away in March 14, 2021 -Patient retired from work as a Barista.   FOLLOW UP INSTRUCTIONS: - Labs in 4 months - RTC after labs    I discussed the assessment and treatment plan with the patient. The patient was provided an opportunity to ask questions and all were answered. The patient agreed with the plan and demonstrated an understanding of the instructions.   The  patient was advised to call back or seek an in-person evaluation if the symptoms worsen or if the condition fails to improve as anticipated.  I provided 22 minutes of non-face-to-face time during this encounter.   Harriett Rush, PA-C 08/08/21 2:46 PM

## 2021-08-08 ENCOUNTER — Other Ambulatory Visit: Payer: Self-pay

## 2021-08-08 ENCOUNTER — Encounter (HOSPITAL_COMMUNITY): Payer: Self-pay | Admitting: Physician Assistant

## 2021-08-08 ENCOUNTER — Inpatient Hospital Stay (HOSPITAL_COMMUNITY): Payer: Medicare Other | Attending: Physician Assistant | Admitting: Physician Assistant

## 2021-08-08 VITALS — Wt 132.0 lb

## 2021-08-08 DIAGNOSIS — D696 Thrombocytopenia, unspecified: Secondary | ICD-10-CM

## 2021-08-08 DIAGNOSIS — D500 Iron deficiency anemia secondary to blood loss (chronic): Secondary | ICD-10-CM

## 2021-08-10 ENCOUNTER — Ambulatory Visit (INDEPENDENT_AMBULATORY_CARE_PROVIDER_SITE_OTHER): Payer: Medicare Other | Admitting: *Deleted

## 2021-08-10 ENCOUNTER — Other Ambulatory Visit: Payer: Self-pay

## 2021-08-10 DIAGNOSIS — Z0000 Encounter for general adult medical examination without abnormal findings: Secondary | ICD-10-CM

## 2021-08-10 NOTE — Progress Notes (Signed)
Subjective:   Tasha Roberts is a 80 y.o. female who presents for Medicare Annual (Subsequent) preventive examination.  I connected with  [name] on 08/10/21 by an audio enabled telemedicine application and verified that I am speaking with the correct person using two identifiers.   I discussed the limitations, risks, security and privacy concerns of performing an evaluation and management service by telephone and the availability of in person appointments. I also discussed with the patient that there may be a patient responsible charge related to this service. The patient expressed understanding and verbally consented to this telephonic visit.   Review of Systems           Objective:    There were no vitals filed for this visit. There is no height or weight on file to calculate BMI.  Advanced Directives 08/08/2021 05/10/2021 04/25/2021 04/04/2021 03/20/2021 03/04/2021 02/21/2021  Does Patient Have a Medical Advance Directive? No Yes Yes Yes Yes No Yes  Type of Advance Directive - Healthcare Power of [place] (No Data) [place]  Does patient want to make changes to medical advance directive? - - No - Patient declined - - No - Patient declined No - Patient declined  Copy of [place] in Chart? - No - copy requested No - copy requested No - copy requested - - Yes - validated most recent copy scanned in chart (See row information)  Would patient like information on creating a medical advance directive? No - Patient declined - No - Patient declined - - No - Patient declined -    Current Medications (verified) Outpatient Encounter Medications as of 08/10/2021  Medication Sig   apixaban (ELIQUIS) 5 MG TABS tablet Take 1 tablet (5 mg total) by mouth 2 (two) times daily.   atorvastatin (LIPITOR) 20 MG tablet Take 20 mg by mouth daily.   Cholecalciferol 50 MCG (2000 UT) CAPS  Take 2,000 mg by mouth daily.   diltiazem (CARDIZEM CD) 120 MG 24 hr capsule Take 1 capsule (120 mg total) by mouth daily.   levothyroxine (SYNTHROID) 100 MCG tablet Take 1 tablet (100 mcg total) by mouth daily before breakfast.   lisinopril-hydrochlorothiazide (ZESTORETIC) 20-25 MG tablet TAKE (1) TABLET BY MOUTH EACH MORNING.   nitroGLYCERIN (NITROSTAT) 0.4 MG SL tablet Place 1 tablet (0.4 mg total) under the tongue every 5 (five) minutes as needed for chest pain.   ursodiol (ACTIGALL) 250 MG tablet Take 250 mg by mouth 2 (two) times daily.   No facility-administered encounter medications on file as of 08/10/2021.    Allergies (verified) Dicyclomine, Glucophage [metformin], and Losartan   History: Past Medical History:  Diagnosis Date   Anemia due to blood loss    iron infusion's last one 03-25-2021 (pt bleeding post vulvar bx 02-11-2021, hospiatal admission 02-21-2021)   Anticoagulant long-term use    eliquis--- managed by pcp    Arthritis    hands   Bronchiectasis [facility])    pulmonology --- dr Halford Chessman   (04-01-2021  pt stated no supplemental oxygen use since 01/ 2022 and no inhaler use,  stated checks O2 at home during the day 98-99%)   Chronic constipation    Coronary artery calcification    per lexiscan 08-17-2019 result in care everywhere   DOE (dyspnea on exertion)    04-01-2021  per pt only going up stairs    History of diabetes mellitus, type II  04-01-2021  per pt no issues after weight loss, last taken medication approx 2017   History of Graves' disease 2017   s/p RAI   History of left breast cancer    dx 2013,  s/p left partial mastecotmy w/ node dissection 09-15-2012, low grade DCIS, no chemo, completed radiation 02/ 2014,  per pt no recurrence   History of respiratory syncytial virus (RSV) infection 09/2020   w/ hypoxia  , hospital admission 10/ 2021  and acute exacerbation bronchiectasis;  follow up post hospital pulmonogy dr Halford Chessman  note in epic   Hyperlipidemia     Hypertension    followed by pcp   (pt had lexiscan in care everywhere dated 08-17-2019 no ischemia , non-obstructive extensive calcification, ef 69%)   Hypothyroidism    Hypothyroidism, postradioiodine therapy    followed by pcp   Liver cirrhosis secondary to NASH (nonalcoholic steatohepatitis) ([place])    followed by dr Merrilee Jansky ([facility])---- dx 2004,  compensated ,  last liver bx (03/ 2015) fibrosis stage 3;  moderate portal hypertensive gastrophy   PAF (paroxysmal atrial fibrillation) ([place]) previous cardiologist lov note w/ Suann Larry PA ([facility] in [place]) dated 01-24-2020 in care everywhere;  (04-01-2021 per pt has appt w/ new cardiologist , dr j. 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 Delton Coombes   VIN III (vulvar intraepithelial neoplasia III)    Past Surgical History:  Procedure Laterality Date   ABDOMINAL HYSTERECTOMY  1989   W/  UNILATERAL SALPINGOOPHORECTOMY   CATARACT EXTRACTION W/ INTRAOCULAR LENS  IMPLANT, BILATERAL  2016   CHOLECYSTECTOMY OPEN  4481   CO2 LASER APPLICATION N/A 8/56/3149   Procedure: CO2 LASER APPLICATION OF THE VULVA;  Surgeon: Everitt Amber, MD;  Location: [place];  Service: Gynecology;  Laterality: N/A;   COLONOSCOPY WITH ESOPHAGOGASTRODUODENOSCOPY (EGD)  last one 08/ 2018  @[facility]    LUMBAR LAMINECTOMY  2009   PARTIAL MASTECTOMY WITH AXILLARY SENTINEL LYMPH NODE BIOPSY Left 09-15-2012   [facility]   Family History   Problem Relation Age of Onset   Hypertension Mother    Dementia Mother    Heart disease Father    Heart disease Brother    Social History   Socioeconomic History   Marital status: Widowed    Spouse name: Not on file   Number of children: 3   Years of education: Not on file   Highest education level: Not on file  Occupational History   Occupation: retired  Tobacco Use   Smoking status: Never   Smokeless tobacco: Never  Vaping Use   Vaping Use: Never used  Substance and Sexual Activity   Alcohol use: Not Currently   Drug use: Never   Sexual activity: Not Currently    Birth control/protection: Post-menopausal, Surgical  Other Topics Concern   Not on file  Social History Narrative   Not on file   Social Determinants of Health   Financial Resource Strain: Low  Risk    Difficulty of Paying Living Expenses: Not hard at all  Food Insecurity: No Food Insecurity   Worried About [place] in the Last Year: Never true   Ran Out of Food in the Last Year: Never true  Transportation Needs: No Transportation Needs   Lack of Transportation (Medical): No   Lack of Transportation (Non-Medical): No  Physical Activity: Inactive   Days of Exercise per Week: 0 days   Minutes of Exercise per Session: 0 min  Stress: Stress Concern Present   Feeling of Stress : To some extent  Social Connections: Moderately Integrated   Frequency of Communication with Friends and Family: More than three times a week   Frequency of Social Gatherings with Friends and Family: More than three times a week   Attends Religious Services: More than 4 times per year   Active Member of [employer] or Organizations: No   Attends Archivist Meetings: Never   Marital Status: Married    Tobacco Counseling Counseling given: Not Answered   Clinical Intake:                 Diabetic?No         Activities of Daily Living In your present state of health, do you have any difficulty  performing the following activities: 04/04/2021 02/21/2021  Hearing? N N  Vision? N N  Difficulty concentrating or making decisions? N N  Walking or climbing stairs? Y N  Comment sob -  Dressing or bathing? N N  Doing errands, shopping? - N  Some recent data might be hidden    Patient Care Team: Lindell Spar, MD as PCP - General (Internal Medicine) Harl Bowie Alphonse Guild, MD as PCP - Cardiology (Cardiology)  Indicate any recent Medical Services you may have received from other than [facility] providers in the past year (date may be approximate).     Assessment:   This is a routine wellness examination for Tasha Roberts.  Hearing/Vision screen No results found.  Dietary issues and exercise activities discussed:     Goals Addressed   None   Depression Screen PHQ 2/9 Scores 12/04/2020  PHQ - 2 Score 2  PHQ- 9 Score 7    Fall Risk Fall Risk  01/24/2021 12/04/2020  Falls in the past year? 1 1  Number falls in past yr: 0 0  Injury with Fall? 1 1  Follow up - Falls evaluation completed    FALL RISK PREVENTION PERTAINING TO THE HOME:  Any stairs in or around the home? No  If so, are there any without handrails? No  Home free of loose throw rugs in walkways, pet beds, electrical cords, etc? Yes  Adequate lighting in your home to reduce risk of falls? Yes   ASSISTIVE DEVICES UTILIZED TO PREVENT FALLS:  Life alert? No  Use of a cane, walker or w/c? Yes  Grab bars in the bathroom? Yes  Shower chair or bench in shower? Yes  Elevated toilet seat or a handicapped toilet? Yes   TIMED UP AND GO:  Was the test performed? No .  Length of time to ambulate 10 feet: NA sec.     Cognitive Function:        Immunizations Immunization History  Administered Date(s) Administered   Fluad Quad(high Dose 65+) 12/02/2017   Influenza Split 09/01/2012   Influenza, High Dose Seasonal PF 09/17/2016, 11/15/2018   Influenza,inj,Quad PF,6+ Mos 09/16/2010, 09/01/2012, 09/06/2019   Moderna  SARS-COV2 Booster Vaccination 07/15/2021  Moderna Sars-Covid-2 Vaccination 02/29/2020, 03/31/2020   Pneumococcal Conjugate-13 03/06/2016   Pneumococcal Polysaccharide-23 09/01/2012    TDAP status: Due, Education has been provided regarding the importance of this vaccine. Advised may receive this vaccine at local pharmacy or Health Dept. Aware to provide a copy of the vaccination record if obtained from local pharmacy or Health Dept. Verbalized acceptance and understanding.  Flu Vaccine status: Due, Education has been provided regarding the importance of this vaccine. Advised may receive this vaccine at local pharmacy or Health Dept. Aware to provide a copy of the vaccination record if obtained from local pharmacy or Health Dept. Verbalized acceptance and understanding.  Pneumococcal vaccine status: Up to date  Covid-19 vaccine status: Completed vaccines  Qualifies for Shingles Vaccine? Yes   Zostavax completed Yes   Shingrix Completed?: No.    Education has been provided regarding the importance of this vaccine. Patient has been advised to call insurance company to determine out of pocket expense if they have not yet received this vaccine. Advised may also receive vaccine at local pharmacy or Health Dept. Verbalized acceptance and understanding.  Screening Tests Health Maintenance  Topic Date Due   DEXA SCAN  Never done   INFLUENZA VACCINE  09/12/2021 (Originally 07/08/2021)   Zoster Vaccines- Shingrix (1 of 2) 10/31/2021 (Originally 07/15/1960)   TETANUS/TDAP  07/31/2022 (Originally 07/15/1960)   COVID-19 Vaccine (4 - Booster for Moderna series) 10/15/2021   Hepatitis C Screening  Completed   PNA vac Low Risk Adult  Completed   HPV VACCINES  Aged Out    Health Maintenance  Health Maintenance Due  Topic Date Due   DEXA SCAN  Never done    Colorectal cancer screening: No longer required.   Mammogram status: Completed 03-15-21. Repeat every year  Bone Density status: Ordered  Scheduled for 09-10-21. Pt provided with contact info and advised to call to schedule appt.  Lung Cancer Screening: (Low Dose CT Chest recommended if Age 27-80 years, 30 pack-year currently smoking OR have quit w/in 15years.) does not qualify.   Lung Cancer Screening Referral: NA  Additional Screening:  Hepatitis C Screening: does qualify; Completed 03-15-21  Vision Screening: Recommended annual ophthalmology exams for early detection of glaucoma and other disorders of the eye. Is the patient up to date with their annual eye exam?  Yes  Who is the provider or what is the name of the office in which the patient attends annual eye exams? [facility] Lin[facility] Hoff If pt is not established with a provider, would they like to be referred to a provider to establish care? No .   Dental Screening: Recommended annual dental exams for proper oral hygiene  Community Resource Referral / Chronic Care Management: CRR required this visit?  No   CCM required this visit?  No      Plan:     I have personally reviewed and noted the following in the patient's chart:   Medical and social history Use of alcohol, tobacco or illicit drugs  Current medications and supplements including opioid prescriptions.  Functional ability and status Nutritional status Physical activity Advanced directives List of other physicians Hospitalizations, surgeries, and ER visits in previous 12 months Vitals Screenings to include cognitive, depression, and falls Referrals and appointments  In addition, I have reviewed and discussed with patient certain preventive protocols, quality metrics, and best practice recommendations. A written personalized care plan for preventive services as well as general preventive health recommendations were provided to patient.     Edwena Blow  Iona Coach, CMA   08/10/2021   Nurse Notes: This was a telehealth visit. The patient was at home. The provider was at home and was Ihor Dow,  MD.

## 2021-08-10 NOTE — Patient Instructions (Signed)
[name] , Thank you for taking time to come for your Medicare Wellness Visit. I appreciate your ongoing commitment to your health goals. Please review the following plan we discussed and let me know if I can assist you in the future.   Screening recommendations/referrals: Mammogram: Due 03-15-22 Bone Density: Scheduled 09-10-21 Recommended yearly ophthalmology/optometry visit for glaucoma screening and checkup Recommended yearly dental visit for hygiene and checkup  Vaccinations: Influenza vaccine: Due now  Pneumococcal vaccine: Completed Tdap vaccine: Due now Shingles vaccine: Has an appt at pharmacy    Advanced directives: Copy requested  Conditions/risks identified: Hypertension  Next appointment: 1 year    Preventive Care 42 Years and Older, Female Preventive care refers to lifestyle choices and visits with your health care provider that can promote health and wellness. What does preventive care include? A yearly physical exam. This is also called an annual well check. Dental exams once or twice a year. Routine eye exams. Ask your health care provider how often you should have your eyes checked. Personal lifestyle choices, including: Daily care of your teeth and gums. Regular physical activity. Eating a healthy diet. Avoiding tobacco and drug use. Limiting alcohol use. Practicing safe sex. Taking low-dose aspirin every day. Taking vitamin and mineral supplements as recommended by your health care provider. What happens during an annual well check? The services and screenings done by your health care provider during your annual well check will depend on your age, overall health, lifestyle risk factors, and family history of disease. Counseling  Your health care provider may ask you questions about your: Alcohol use. Tobacco use. Drug use. Emotional well-being. Home and relationship well-being. Sexual activity. Eating habits. History of falls. Memory and ability to  understand (cognition). Work and work Statistician. Reproductive health. Screening  You may have the following tests or measurements: Height, weight, and BMI. Blood pressure. Lipid and cholesterol levels. These may be checked every 5 years, or more frequently if you are over 33 years old. Skin check. Lung cancer screening. You may have this screening every year starting at age 11 if you have a 30-pack-year history of smoking and currently smoke or have quit within the past 15 years. Fecal occult blood test (FOBT) of the stool. You may have this test every year starting at age 59. Flexible sigmoidoscopy or colonoscopy. You may have a sigmoidoscopy every 5 years or a colonoscopy every 10 years starting at age 59. Hepatitis C blood test. Hepatitis B blood test. Sexually transmitted disease (STD) testing. Diabetes screening. This is done by checking your blood sugar (glucose) after you have not eaten for a while (fasting). You may have this done every 1-3 years. Bone density scan. This is done to screen for osteoporosis. You may have this done starting at age 73. Mammogram. This may be done every 1-2 years. Talk to your health care provider about how often you should have regular mammograms. Talk with your health care provider about your test results, treatment options, and if necessary, the need for more tests. Vaccines  Your health care provider may recommend certain vaccines, such as: Influenza vaccine. This is recommended every year. Tetanus, diphtheria, and acellular pertussis (Tdap, Td) vaccine. You may need a Td booster every 10 years. Zoster vaccine. You may need this after age 55. Pneumococcal 13-valent conjugate (PCV13) vaccine. One dose is recommended after age 64. Pneumococcal polysaccharide (PPSV23) vaccine. One dose is recommended after age 41. Talk to your health care provider about which screenings and vaccines you need  and how often you need them. This information is not  intended to replace advice given to you by your health care provider. Make sure you discuss any questions you have with your health care provider. Document Released: 12/21/2015 Document Revised: 08/13/2016 Document Reviewed: 09/25/2015 Elsevier Interactive Patient Education  2017 [place] Prevention in the Home Falls can cause injuries. They can happen to people of all ages. There are many things you can do to make your home safe and to help prevent falls. What can I do on the outside of my home? Regularly fix the edges of walkways and driveways and fix any cracks. Remove anything that might make you trip as you walk through a door, such as a raised step or threshold. Trim any bushes or trees on the path to your home. Use bright outdoor lighting. Clear any walking paths of anything that might make someone trip, such as rocks or tools. Regularly check to see if handrails are loose or broken. Make sure that both sides of any steps have handrails. Any raised decks and porches should have guardrails on the edges. Have any leaves, snow, or ice cleared regularly. Use sand or salt on walking paths during winter. Clean up any spills in your garage right away. This includes oil or grease spills. What can I do in the bathroom? Use night lights. Install grab bars by the toilet and in the tub and shower. Do not use towel bars as grab bars. Use non-skid mats or decals in the tub or shower. If you need to sit down in the shower, use a plastic, non-slip stool. Keep the floor dry. Clean up any water that spills on the floor as soon as it happens. Remove soap buildup in the tub or shower regularly. Attach bath mats securely with double-sided non-slip rug tape. Do not have throw rugs and other things on the floor that can make you trip. What can I do in the bedroom? Use night lights. Make sure that you have a light by your bed that is easy to reach. Do not use any sheets or blankets that are  too big for your bed. They should not hang down onto the floor. Have a firm chair that has side arms. You can use this for support while you get dressed. Do not have throw rugs and other things on the floor that can make you trip. What can I do in the kitchen? Clean up any spills right away. Avoid walking on wet floors. Keep items that you use a lot in easy-to-reach places. If you need to reach something above you, use a strong step stool that has a grab bar. Keep electrical cords out of the way. Do not use floor polish or wax that makes floors slippery. If you must use wax, use non-skid floor wax. Do not have throw rugs and other things on the floor that can make you trip. What can I do with my stairs? Do not leave any items on the stairs. Make sure that there are handrails on both sides of the stairs and use them. Fix handrails that are broken or loose. Make sure that handrails are as long as the stairways. Check any carpeting to make sure that it is firmly attached to the stairs. Fix any carpet that is loose or worn. Avoid having throw rugs at the top or bottom of the stairs. If you do have throw rugs, attach them to the floor with carpet tape. Make sure that you  have a light switch at the top of the stairs and the bottom of the stairs. If you do not have them, ask someone to add them for you. What else can I do to help prevent falls? Wear shoes that: Do not have high heels. Have rubber bottoms. Are comfortable and fit you well. Are closed at the toe. Do not wear sandals. If you use a stepladder: Make sure that it is fully opened. Do not climb a closed stepladder. Make sure that both sides of the stepladder are locked into place. Ask someone to hold it for you, if possible. Clearly mark and make sure that you can see: Any grab bars or handrails. First and last steps. Where the edge of each step is. Use tools that help you move around (mobility aids) if they are needed. These  include: Canes. Walkers. Scooters. Crutches. Turn on the lights when you go into a dark area. Replace any light bulbs as soon as they burn out. Set up your furniture so you have a clear path. Avoid moving your furniture around. If any of your floors are uneven, fix them. If there are any pets around you, be aware of where they are. Review your medicines with your doctor. Some medicines can make you feel dizzy. This can increase your chance of falling. Ask your doctor what other things that you can do to help prevent falls. This information is not intended to replace advice given to you by your health care provider. Make sure you discuss any questions you have with your health care provider. Document Released: 09/20/2009 Document Revised: 05/01/2016 Document Reviewed: 12/29/2014 Elsevier Interactive Patient Education  2017 [employer].

## 2021-08-13 ENCOUNTER — Encounter: Payer: Self-pay | Admitting: Emergency Medicine

## 2021-08-13 ENCOUNTER — Encounter (HOSPITAL_COMMUNITY): Payer: Self-pay | Admitting: Hematology

## 2021-08-13 ENCOUNTER — Other Ambulatory Visit: Payer: Self-pay

## 2021-08-13 ENCOUNTER — Ambulatory Visit
Admission: EM | Admit: 2021-08-13 | Discharge: 2021-08-13 | Disposition: A | Discharge status: Home / Self Care | Payer: Medicare Other | Attending: Family Medicine | Admitting: Family Medicine

## 2021-08-13 DIAGNOSIS — R350 Frequency of micturition: Secondary | ICD-10-CM | POA: Insufficient documentation

## 2021-08-13 DIAGNOSIS — N3090 Cystitis, unspecified without hematuria: Secondary | ICD-10-CM | POA: Diagnosis present

## 2021-08-13 LAB — POCT URINALYSIS DIP (MANUAL ENTRY)
Bilirubin, UA: NEGATIVE
Glucose, UA: NEGATIVE mg/dL
Ketones, POC UA: NEGATIVE mg/dL
Nitrite, UA: NEGATIVE
Protein Ur, POC: 30 mg/dL — AB
Spec Grav, UA: 1.02 (ref 1.010–1.025)
Urobilinogen, UA: 0.2 E.U./dL
pH, UA: 5.5 (ref 5.0–8.0)

## 2021-08-13 MED ORDER — CEPHALEXIN 500 MG PO CAPS: 500.0000 mg | ORAL_CAPSULE | Freq: Two times a day (BID) | ORAL | 0 refills | Status: DC

## 2021-08-13 NOTE — ED Provider Notes (Signed)
[place]    ASSESSMENT & PLAN:  1. Urinary frequency   2. Cystitis    Begin: Meds ordered this encounter  Medications   cephALEXin (KEFLEX) 500 MG capsule    Sig: Take 1 capsule (500 mg total) by mouth 2 (two) times daily.    Dispense:  10 capsule    Refill:  0   No signs of pyelonephritis. Discussed. Urine culture sent. Will follow up with her PCP or here if not showing improvement over the next 48 hours, sooner if needed.  Outlined signs and symptoms indicating need for more acute intervention. Patient verbalized understanding. After Visit Summary given.  SUBJECTIVE:  Tasha Roberts is a 80 y.o. female who complains of urinary frequency, urgency and dysuria for the past 12 hours. Without associated flank pain, fever, chills, vaginal discharge or bleeding. Gross hematuria: not present. No specific aggravating or alleviating factors reported. No LE edema. Normal PO intake without n/v/d. Without specific abdominal pain. Ambulatory without difficulty. OTC treatment: none. H/O UTI: infrequent.  LMP: No LMP recorded. Patient has had a hysterectomy.  OBJECTIVE:  Vitals:   08/13/21 0844  BP: 128/62  Pulse: 68  Resp: 18  Temp: 97.7 F (36.5 C)  TempSrc: Oral  SpO2: 96%   General appearance: alert; no distress Lungs: unlabored respirations Abdomen: soft Back: no CVA tenderness Extremities: no edema; symmetrical with no gross deformities Skin: warm and dry Neurologic: normal gait Psychological: alert and cooperative; normal mood and affect  Labs Reviewed  POCT URINALYSIS DIP (MANUAL ENTRY) - Abnormal; Notable for the following components:      Result Value   Clarity, UA cloudy (*)    Blood, UA moderate (*)    Protein Ur, POC =30 (*)    Leukocytes, UA Moderate (2+) (*)    All other components within normal limits  URINE CULTURE    Allergies  Allergen Reactions   Dicyclomine Other (See Comments)    Dry cough   Glucophage [Metformin] Diarrhea    Losartan Other (See Comments)    Heartburn     Past Medical History:  Diagnosis Date   Anemia due to blood loss    iron infusion's last one 03-25-2021 (pt bleeding post vulvar bx 02-11-2021, hospiatal admission 02-21-2021)   Anticoagulant long-term use    eliquis--- managed by pcp    Arthritis    hands   Bronchiectasis [facility])    pulmonology --- dr Halford Chessman   (04-01-2021  pt stated no supplemental oxygen use since 01/ 2022 and no inhaler use,  stated checks O2 at home during the day 98-99%)   Cancer ([place]) 2013   Lumpectomy left breast   Cataract 2015   Chronic constipation    Clotting disorder ([place])    On eliquis   Coronary artery calcification    per lexiscan 08-17-2019 result in care everywhere   DOE (dyspnea on exertion)    04-01-2021  per pt only going up stairs    History of diabetes mellitus, type II    04-01-2021  per pt no issues after weight loss, last taken medication approx 2017   History of Graves' disease 2017   s/p RAI   History of left breast cancer    dx 2013,  s/p left partial mastecotmy w/ node dissection 09-15-2012, low grade DCIS, no chemo, completed radiation 02/ 2014,  per pt no recurrence   History of respiratory syncytial virus (RSV) infection 09/2020   w/ hypoxia  , hospital admission 10/ 2021  and acute  exacerbation bronchiectasis;  follow up post hospital pulmonogy dr Halford Chessman  note in epic   Hyperlipidemia    Hypertension    followed by pcp   (pt had lexiscan in care everywhere dated 08-17-2019 no ischemia , non-obstructive extensive calcification, ef 69%)   Hypothyroidism    Hypothyroidism, postradioiodine therapy    followed by pcp   Liver cirrhosis secondary to NASH (nonalcoholic steatohepatitis) ([place])    followed by dr Merrilee Jansky ([facility])---- dx 2004,  compensated ,  last liver bx (03/ 2015) fibrosis stage 3;  moderate portal hypertensive gastrophy   PAF (paroxysmal atrial fibrillation) ([place]) previous cardiologist lov note w/ Suann Larry PA ([facility] in [place]) dated 01-24-2020 in care everywhere;  (04-01-2021 per pt has appt w/ new cardiologist , dr j. 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 Delton Coombes   VIN III (vulvar intraepithelial neoplasia III)    Social History   Socioeconomic History   Marital status: Widowed    Spouse name: Not on file   Number of children: 3   Years of education: Not on file   Highest education level: Not on file  Occupational History   Occupation: retired  Tobacco Use   Smoking status: Never   Smokeless tobacco: Never  Vaping Use   Vaping Use: Never used  Substance and Sexual Activity   Alcohol use: Not Currently   Drug use: Never   Sexual activity: Not Currently    Birth control/protection: Post-menopausal  Other Topics Concern   Not on file  Social History Narrative   Not on file   Social Determinants of Health   Financial Resource Strain: Low Risk    Difficulty of Paying Living Expenses: Not hard at all  Food Insecurity: No Food Insecurity   Worried About Charity fundraiser in the Last Year: Never true   [place] in the Last Year: Never true  Transportation Needs: No Transportation Needs   Lack of Transportation (Medical): No   Lack of Transportation (Non-Medical): No  Physical Activity: Insufficiently Active   Days of Exercise per Week: 3 days   Minutes of Exercise per Session: 30 min  Stress: No Stress Concern Present   Feeling of Stress : Not at  all  Social Connections: Moderately Isolated   Frequency of Communication with Friends and Family: More than three times a week   Frequency of Social Gatherings with Friends and Family: More than three times a week   Attends Religious Services: More than 4 times per year   Active Member of [employer] or Organizations: No   Attends Archivist Meetings: Never   Marital Status: Widowed  Human resources officer Violence: Not At Risk   Fear of Current or Ex-Partner: No   Emotionally Abused: No   Physically Abused: No   Sexually Abused: No   Family History  Problem Relation Age of Onset   Hypertension Mother    Dementia Mother  Vision loss Mother    Heart disease Father    Hearing loss Father    Stroke Maternal Grandfather    Heart disease Brother         Vanessa Kick, MD 08/13/21 [id number]

## 2021-08-13 NOTE — Discharge Instructions (Addendum)
You have had labs (urine culture) sent today. We will call you with any significant abnormalities or if there is need to begin or change treatment or pursue further follow up.  You may also review your test results online through MyChart. If you do not have a MyChart account, instructions to sign up should be on your discharge paperwork.  

## 2021-08-13 NOTE — ED Triage Notes (Signed)
Urinary frequency and urinating small amounts that started last night.

## 2021-08-16 LAB — URINE CULTURE
Culture: >=100000 — AB
Special Requests: NORMAL

## 2021-08-20 ENCOUNTER — Telehealth: Payer: Self-pay

## 2021-08-20 ENCOUNTER — Other Ambulatory Visit: Payer: Self-pay

## 2021-08-20 DIAGNOSIS — K7581 Nonalcoholic steatohepatitis (NASH): Secondary | ICD-10-CM

## 2021-08-20 DIAGNOSIS — K7460 Unspecified cirrhosis of liver: Secondary | ICD-10-CM

## 2021-08-20 MED ORDER — ATORVASTATIN CALCIUM 20 MG PO TABS: 20.0000 mg | ORAL_TABLET | Freq: Every day | ORAL | 1 refills | Status: DC

## 2021-08-20 NOTE — Telephone Encounter (Signed)
Rx refilled.

## 2021-08-20 NOTE — Telephone Encounter (Signed)
Patient calling she is needing a 90 day supply of her Atorvastatin sent to [place] this was written by her old provider ph# [phone]

## 2021-09-10 ENCOUNTER — Other Ambulatory Visit (HOSPITAL_COMMUNITY): Payer: Medicare Other

## 2021-09-18 ENCOUNTER — Encounter: Payer: Self-pay | Admitting: Emergency Medicine

## 2021-09-18 ENCOUNTER — Ambulatory Visit
Admission: EM | Admit: 2021-09-18 | Discharge: 2021-09-18 | Disposition: A | Discharge status: Home / Self Care | Payer: Medicare Other | Attending: Family Medicine | Admitting: Family Medicine

## 2021-09-18 ENCOUNTER — Encounter (HOSPITAL_COMMUNITY): Payer: Self-pay | Admitting: Hematology

## 2021-09-18 ENCOUNTER — Other Ambulatory Visit: Payer: Self-pay

## 2021-09-18 ENCOUNTER — Other Ambulatory Visit: Payer: Self-pay | Admitting: Cardiology

## 2021-09-18 DIAGNOSIS — L03115 Cellulitis of right lower limb: Secondary | ICD-10-CM

## 2021-09-18 DIAGNOSIS — S81811A Laceration without foreign body, right lower leg, initial encounter: Secondary | ICD-10-CM | POA: Diagnosis not present

## 2021-09-18 MED ORDER — DOXYCYCLINE HYCLATE 100 MG PO CAPS: 100.0000 mg | ORAL_CAPSULE | Freq: Two times a day (BID) | ORAL | 0 refills | Status: DC

## 2021-09-18 MED ORDER — HIBICLENS 4 % EX LIQD: Freq: Every day | CUTANEOUS | 0 refills | Status: DC | PRN

## 2021-09-18 MED ORDER — MUPIROCIN 2 % EX OINT: 1.0000 "application " | TOPICAL_OINTMENT | Freq: Two times a day (BID) | CUTANEOUS | 0 refills | Status: DC

## 2021-09-18 NOTE — ED Triage Notes (Signed)
Hit right lower leg last Thursday and had a skin tear.  Leg is red, swollen and warm to the touch.

## 2021-09-18 NOTE — ED Provider Notes (Signed)
[facility]    CSN: [id number] Arrival date & time: 09/18/21  1314      History   Chief Complaint No chief complaint on file.   HPI Tasha Roberts is a 80 y.o. female.   Patient presenting today with 6-day history of large skin tear to right anterior lower leg.  Has been treating the area with peroxide, gauze and paper tape but notes that it is becoming larger, red, swollen and warm to the touch extending from the wound.  Denies fever, chills but is having thick drainage from the area.  No numbness or tingling to the leg.   Past Medical History:  Diagnosis Date   Anemia due to blood loss    iron infusion's last one 03-25-2021 (pt bleeding post vulvar bx 02-11-2021, hospiatal admission 02-21-2021)   Anticoagulant long-term use    eliquis--- managed by pcp    Arthritis    hands   Bronchiectasis [facility])    pulmonology --- dr Halford Chessman   (04-01-2021  pt stated no supplemental oxygen use since 01/ 2022 and no inhaler use,  stated checks O2 at home during the day 98-99%)   Cancer ([place]) 2013   Lumpectomy left breast   Cataract 2015   Chronic constipation    Clotting disorder ([place])    On eliquis   Coronary artery calcification    per lexiscan 08-17-2019 result in care everywhere   DOE (dyspnea on exertion)    04-01-2021  per pt only going up stairs    History of diabetes mellitus, type II    04-01-2021  per pt no issues after weight loss, last taken medication approx 2017   History of Graves' disease 2017   s/p RAI   History of left breast cancer    dx 2013,  s/p left partial mastecotmy w/ node dissection 09-15-2012, low grade DCIS, no chemo, completed radiation 02/ 2014,  per pt no recurrence   History of respiratory syncytial virus (RSV) infection 09/2020   w/ hypoxia  , hospital admission 10/ 2021  and acute exacerbation bronchiectasis;  follow up post hospital pulmonogy dr Halford Chessman  note in epic   Hyperlipidemia    Hypertension    followed by pcp   (pt had lexiscan in  care everywhere dated 08-17-2019 no ischemia , non-obstructive extensive calcification, ef 69%)   Hypothyroidism    Hypothyroidism, postradioiodine therapy    followed by pcp   Liver cirrhosis secondary to NASH (nonalcoholic steatohepatitis) ([place])    followed by dr Merrilee Jansky ([facility])---- dx 2004,  compensated ,  last liver bx (03/ 2015) fibrosis stage 3;  moderate portal hypertensive gastrophy   PAF (paroxysmal atrial fibrillation) ([place]) previous cardiologist lov note w/ Suann Larry PA ([facility] in [place]) dated 01-24-2020 in care everywhere;  (04-01-2021 per pt has appt w/ new cardiologist , dr j. 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 Delton Coombes   VIN III (vulvar intraepithelial neoplasia III)  Patient Active Problem List   Diagnosis Date Noted   Essential hypertension 07/31/2021   Encounter to establish care 07/31/2021   Age related osteoporosis 07/31/2021   Acquired hypothyroidism 07/31/2021   Chronic diarrhea 07/31/2021   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   Iron deficiency anemia due to chronic blood loss 03/04/2021   Thrombocytopenia ([place]) 03/04/2021   Acquired thrombophilia ([place]) 02/21/2021   CAD (coronary artery disease)    Lichen planus 37/09/6268   Bronchiectasis with (acute) exacerbation ([place]) 10/02/2020   AF (paroxysmal atrial fibrillation) ([place]) 10/02/2020   Liver cirrhosis secondary to NASH ([place]) 10/02/2020    Sarcoidosis 1965    Past Surgical History:  Procedure Laterality Date   [place]   BREAST SURGERY  09/15/2012   CATARACT EXTRACTION W/ INTRAOCULAR LENS  IMPLANT, BILATERAL  2016   CHOLECYSTECTOMY OPEN  4854   CO2 LASER APPLICATION N/A 62/70/3500   Procedure: CO2 LASER APPLICATION OF THE VULVA;  Surgeon: Everitt Amber, MD;  Location: [facility];  Service: Gynecology;  Laterality: N/A;   COLONOSCOPY WITH ESOPHAGOGASTRODUODENOSCOPY (EGD)  last one 08/ 2018  @[facility]    EYE SURGERY  2015   Cataract removed both eyes   LUMBAR LAMINECTOMY  2009   PARTIAL MASTECTOMY WITH AXILLARY SENTINEL LYMPH NODE BIOPSY Left 09-15-2012   [place]   TUBAL LIGATION  08/08/1969    OB History     Gravida  3   Para  3   Term  3   Preterm      AB      Living  3      SAB      IAB      Ectopic      Multiple      Live Births               Home Medications    Prior to Admission medications   Medication Sig Start Date End Date Taking? Authorizing Provider  chlorhexidine (HIBICLENS) 4 % external liquid Apply topically daily as needed. 09/18/21  Yes Volney American, PA-C  doxycycline (VIBRAMYCIN) 100 MG capsule Take 1 capsule (100 mg total) by mouth 2 (two) times daily. 09/18/21  Yes Volney American, PA-C  mupirocin ointment (BACTROBAN) 2 % Apply 1 application topically 2 (two) times daily. 09/18/21  Yes Volney American, PA-C  apixaban (ELIQUIS) 5 MG TABS tablet Take 1 tablet (5 mg total) by mouth 2 (two) times daily. 05/15/21   Arnoldo Lenis, MD  atorvastatin (LIPITOR) 20 MG tablet Take 1 tablet (20 mg total) by mouth daily. 08/20/21   Lindell Spar, MD  cephALEXin (KEFLEX) 500 MG capsule Take 1 capsule (500 mg total) by mouth 2 (two) times daily. 08/13/21   Vanessa Kick, MD  Cholecalciferol 50 MCG (2000 UT) CAPS Take 2,000 mg by mouth daily.    [provider]   diltiazem (CARDIZEM CD) 120 MG 24 hr capsule Take 1 capsule (120 mg total) by mouth daily. 05/01/21   Arnoldo Lenis, MD  levothyroxine (SYNTHROID) 100 MCG tablet Take 1 tablet (100 mcg total) by mouth daily before breakfast. 08/05/21   Lindell Spar, MD  lisinopril-hydrochlorothiazide (ZESTORETIC) 20-25 MG tablet TAKE (1) TABLET BY MOUTH EACH MORNING. 08/05/21   Lindell Spar, MD  nitroGLYCERIN (NITROSTAT) 0.4 MG SL tablet Place 1 tablet (0.4 mg total) under the tongue every 5 (five) minutes as needed for  chest pain. 07/31/21   Lindell Spar, MD  ursodiol (ACTIGALL) 250 MG tablet Take 250 mg by mouth 2 (two) times daily.    [provider]    Family History Family History  Problem Relation Age of Onset   Hypertension Mother    Dementia Mother    Vision loss Mother    Heart disease Father    Hearing loss Father    Stroke Maternal Grandfather    Heart disease Brother     Social History Social History   Tobacco Use   Smoking status: Never   Smokeless tobacco: Never  Vaping Use   Vaping Use: Never used  Substance Use Topics   Alcohol use: Not Currently   Drug use: Never     Allergies   Dicyclomine, Glucophage [metformin], and Losartan   Review of Systems Review of Systems Per HPI  Physical Exam Triage Vital Signs ED Triage Vitals  Enc Vitals Group     BP 09/18/21 1421 (!) 125/57     Pulse Rate 09/18/21 1421 66     Resp 09/18/21 1421 18     Temp 09/18/21 1421 98.1 F (36.7 C)     Temp Source 09/18/21 1421 Oral     SpO2 09/18/21 1421 99 %     Weight --      Height --      Head Circumference --      Peak Flow --      Pain Score 09/18/21 1422 2     Pain Loc --      Pain Edu? --      Excl. in [place]? --    No data found.  Updated Vital Signs BP (!) 125/57 (BP Location: Right Arm)   Pulse 66   Temp 98.1 F (36.7 C) (Oral)   Resp 18   SpO2 99%   Visual Acuity Right Eye Distance:   Left Eye Distance:   Bilateral Distance:    Right Eye  Near:   Left Eye Near:    Bilateral Near:     Physical Exam Vitals and nursing note reviewed.  Constitutional:      Appearance: Normal appearance. She is not ill-appearing.  HENT:     Head: Atraumatic.  Eyes:     Extraocular Movements: Extraocular movements intact.     Conjunctiva/sclera: Conjunctivae normal.  Cardiovascular:     Rate and Rhythm: Normal rate and regular rhythm.     Heart sounds: Normal heart sounds.  Pulmonary:     Effort: Pulmonary effort is normal.     Breath sounds: Normal breath sounds.  Musculoskeletal:        General: Normal range of motion.     Cervical back: Normal range of motion and neck supple.  Skin:    General: Skin is warm.     Comments: 3 cm skin tear present right anterior lower leg with significant erythema extending about 4 cm in either direction.  No active drainage, significantly tender to palpation  Neurological:     Mental Status: She is alert and oriented to person, place, and time.     Comments: Right lower extremity neurovascularly intact  Psychiatric:        Mood and Affect: Mood normal.        Thought Content: Thought content normal.        Judgment: Judgment normal.     UC Treatments / Results  Labs (all labs ordered are listed, but only abnormal results are displayed) Labs Reviewed -  No data to display  EKG   Radiology No results found.  Procedures Procedures (including critical care time)  Medications Ordered in UC Medications - No data to display  Initial Impression / Assessment and Plan / UC Course  I have reviewed the triage vital signs and the nursing notes.  Pertinent labs & imaging results that were available during my care of the patient were reviewed by me and considered in my medical decision making (see chart for details).     Wound cleaned, dressed in clinic and will discharge home on doxycycline, Hibiclens, mupirocin ointment.  Discussed strict return precautions for worsening symptoms.  Final  Clinical Impressions(s) / UC Diagnoses   Final diagnoses:  Cellulitis of leg, right  Laceration of right lower extremity, initial encounter   Discharge Instructions   None    ED Prescriptions     Medication Sig Dispense Auth. Provider   doxycycline (VIBRAMYCIN) 100 MG capsule Take 1 capsule (100 mg total) by mouth 2 (two) times daily. 20 capsule Volney American, [place]   chlorhexidine (HIBICLENS) 4 % external liquid Apply topically daily as needed. 120 mL Volney American, PA-C   mupirocin ointment (BACTROBAN) 2 % Apply 1 application topically 2 (two) times daily. 22 g Volney American, [place]      PDMP not reviewed this encounter.   Volney American, [place] 09/18/21 1520

## 2021-09-19 NOTE — Telephone Encounter (Signed)
Prescription refill request for Eliquis received. Indication: PAF Last office visit: 04/30/21  Zandra Abts MD Scr: 0.98 on 07/31/21 Age: 80 Weight: 59.5kg  Based on above findings Eliquis 5mg  twice daily is the appropriate dose.  Refill approved.

## 2021-09-25 ENCOUNTER — Other Ambulatory Visit (HOSPITAL_COMMUNITY): Payer: Medicare Other

## 2021-10-03 ENCOUNTER — Ambulatory Visit: Payer: Medicare Other | Admitting: Internal Medicine

## 2021-10-10 ENCOUNTER — Encounter: Payer: Self-pay | Admitting: Internal Medicine

## 2021-10-22 ENCOUNTER — Other Ambulatory Visit: Payer: Self-pay | Admitting: Cardiology

## 2021-10-22 ENCOUNTER — Other Ambulatory Visit: Payer: Self-pay | Admitting: Internal Medicine

## 2021-11-02 ENCOUNTER — Ambulatory Visit
Admission: EM | Admit: 2021-11-02 | Discharge: 2021-11-02 | Disposition: A | Discharge status: Home / Self Care | Payer: Medicare Other | Attending: Physician Assistant | Admitting: Physician Assistant

## 2021-11-02 ENCOUNTER — Encounter (HOSPITAL_COMMUNITY): Payer: Self-pay | Admitting: Hematology

## 2021-11-02 DIAGNOSIS — B349 Viral infection, unspecified: Secondary | ICD-10-CM | POA: Diagnosis not present

## 2021-11-02 DIAGNOSIS — R059 Cough, unspecified: Secondary | ICD-10-CM

## 2021-11-02 NOTE — Discharge Instructions (Signed)
Your flu and covid are pending

## 2021-11-02 NOTE — ED Triage Notes (Signed)
Pt states that she has a cough and some nasal congestion. X5 days  Pt states that she is vaccinated for covid. Pt states that she has had flu vaccine.

## 2021-11-02 NOTE — ED Provider Notes (Signed)
[facility]    CSN: [id number] Arrival date & time: 11/02/21  0809      History   Chief Complaint Chief Complaint  Patient presents with   Cough    Pt states that she has a cough and nasal congestion x5 days    HPI Tasha Roberts is a 80 y.o. female.   The history is provided by the patient. No language interpreter was used.  Cough Sputum characteristics:  Nondescript Severity:  Moderate Onset quality:  Gradual Duration:  5 days Timing:  Constant Progression:  Improving Smoker: no   Relieved by:  Nothing Ineffective treatments:  None tried Associated symptoms: headaches   Associated symptoms: no fever, no shortness of breath and no sinus congestion    Past Medical History:  Diagnosis Date   Anemia due to blood loss    iron infusion's last one 03-25-2021 (pt bleeding post vulvar bx 02-11-2021, hospiatal admission 02-21-2021)   Anticoagulant long-term use    eliquis--- managed by pcp    Arthritis    hands   Bronchiectasis [facility])    pulmonology --- dr Halford Chessman   (04-01-2021  pt stated no supplemental oxygen use since 01/ 2022 and no inhaler use,  stated checks O2 at home during the day 98-99%)   Cancer ([place]) 2013   Lumpectomy left breast   Cataract 2015   Chronic constipation    Clotting disorder ([place])    On eliquis   Coronary artery calcification    per lexiscan 08-17-2019 result in care everywhere   DOE (dyspnea on exertion)    04-01-2021  per pt only going up stairs    History of diabetes mellitus, type II    04-01-2021  per pt no issues after weight loss, last taken medication approx 2017   History of Graves' disease 2017   s/p RAI   History of left breast cancer    dx 2013,  s/p left partial mastecotmy w/ node dissection 09-15-2012, low grade DCIS, no chemo, completed radiation 02/ 2014,  per pt no recurrence   History of respiratory syncytial virus (RSV) infection 09/2020   w/ hypoxia  , hospital admission 10/ 2021  and acute exacerbation  bronchiectasis;  follow up post hospital pulmonogy dr Halford Chessman  note in epic   Hyperlipidemia    Hypertension    followed by pcp   (pt had lexiscan in care everywhere dated 08-17-2019 no ischemia , non-obstructive extensive calcification, ef 69%)   Hypothyroidism    Hypothyroidism, postradioiodine therapy    followed by pcp   Liver cirrhosis secondary to NASH (nonalcoholic steatohepatitis) ([place])    followed by dr Merrilee Jansky ([facility])---- dx 2004,  compensated ,  last liver bx (03/ 2015) fibrosis stage 3;  moderate portal hypertensive gastrophy   PAF (paroxysmal atrial fibrillation) ([place]) previous cardiologist lov note w/ Suann Larry PA ([facility] in [place]) dated 01-24-2020 in care everywhere;  (04-01-2021 per pt has appt w/ new cardiologist , dr j. 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 Delton Coombes   VIN III (vulvar intraepithelial neoplasia III)     Patient Active Problem List   Diagnosis Date Noted   Essential hypertension 07/31/2021   Encounter to establish care 07/31/2021   Age related osteoporosis 07/31/2021   Acquired hypothyroidism 07/31/2021   Chronic diarrhea 07/31/2021   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   Iron deficiency anemia due to chronic blood loss 03/04/2021   Thrombocytopenia ([place]) 03/04/2021   Acquired thrombophilia ([place]) 02/21/2021   CAD (coronary artery disease)    Lichen planus 85/88/5027   Bronchiectasis with (acute)  exacerbation ([place]) 10/02/2020   AF (paroxysmal atrial fibrillation) ([place]) 10/02/2020   Liver cirrhosis secondary to NASH ([place]) 10/02/2020   Sarcoidosis 1965    Past Surgical History:  Procedure Laterality Date   [place]   BREAST SURGERY  09/15/2012   CATARACT EXTRACTION W/ INTRAOCULAR LENS  IMPLANT, BILATERAL  2016   CHOLECYSTECTOMY OPEN  7412   CO2 LASER APPLICATION N/A 87/86/7672   Procedure: CO2 LASER APPLICATION OF THE VULVA;  Surgeon: Everitt Amber, MD;  Location: [facility];  Service: Gynecology;  Laterality: N/A;   COLONOSCOPY WITH ESOPHAGOGASTRODUODENOSCOPY (EGD)  last one 08/ 2018  @[facility]    EYE SURGERY  2015   Cataract removed both eyes   LUMBAR LAMINECTOMY  2009   PARTIAL MASTECTOMY WITH AXILLARY SENTINEL LYMPH NODE BIOPSY Left 09-15-2012   [place]   TUBAL LIGATION  08/08/1969    OB History     Gravida  3   Para  3   Term  3   Preterm      AB      Living  3      SAB      IAB      Ectopic      Multiple      Live Births               Home Medications    Prior to Admission medications   Medication Sig Start Date End Date Taking? Authorizing Provider  apixaban (ELIQUIS) 5 MG TABS tablet TAKE (1) TABLET BY MOUTH TWICE DAILY. 09/19/21  Yes Branch, Alphonse Guild, MD  atorvastatin (LIPITOR) 20 MG tablet Take 1 tablet (20 mg total) by mouth daily. 08/20/21  Yes Lindell Spar, MD  cephALEXin (KEFLEX) 500 MG capsule Take 1 capsule (500 mg total) by mouth 2 (two) times daily. 08/13/21  Yes Hagler, Aaron Edelman, MD  chlorhexidine (HIBICLENS) 4 % external liquid Apply topically daily as needed. 09/18/21  Yes Volney American, PA-C  Cholecalciferol 50 MCG (2000 UT) CAPS Take 2,000 mg by mouth daily.   Yes [provider]  diltiazem (CARDIZEM CD) 120 MG 24 hr capsule TAKE (1) CAPSULE BY MOUTH ONCE DAILY. 10/22/21  Yes Branch, Alphonse Guild, MD  doxycycline  (VIBRAMYCIN) 100 MG capsule Take 1 capsule (100 mg total) by mouth 2 (two) times daily. 09/18/21  Yes Volney American, PA-C  levothyroxine (SYNTHROID) 100 MCG tablet TAKE 1 TABLET BY MOUTH ON AN EMPTY STOMACH 30 MINUTES BEFORE BREAKFAST. 10/22/21  Yes Lindell Spar, MD  lisinopril-hydrochlorothiazide (ZESTORETIC) 20-25 MG tablet TAKE (1) TABLET BY MOUTH EACH MORNING. 10/22/21  Yes Lindell Spar, MD  mupirocin ointment (BACTROBAN) 2 % Apply 1 application topically 2 (two) times daily. 09/18/21  Yes Volney American, PA-C  nitroGLYCERIN (NITROSTAT) 0.4 MG SL  tablet Place 1 tablet (0.4 mg total) under the tongue every 5 (five) minutes as needed for chest pain. 07/31/21  Yes Lindell Spar, MD  ursodiol (ACTIGALL) 250 MG tablet Take 250 mg by mouth 2 (two) times daily.   Yes [provider]    Family History Family History  Problem Relation Age of Onset   Hypertension Mother    Dementia Mother    Vision loss Mother    Heart disease Father    Hearing loss Father    Stroke Maternal Grandfather    Heart disease Brother     Social History Social History   Tobacco Use   Smoking status: Never   Smokeless tobacco: Never  Vaping Use   Vaping Use: Never used  Substance Use Topics   Alcohol use: Not Currently   Drug use: Never     Allergies   Dicyclomine, Glucophage [metformin], and Losartan   Review of Systems Review of Systems  Constitutional:  Negative for fever.  Respiratory:  Positive for cough. Negative for shortness of breath.   Neurological:  Positive for headaches.  All other systems reviewed and are negative.   Physical Exam Triage Vital Signs ED Triage Vitals  Enc Vitals Group     BP 11/02/21 0821 (!) 111/59     Pulse Rate 11/02/21 0821 65     Resp 11/02/21 0821 18     Temp 11/02/21 0821 98.2 F (36.8 C)     Temp Source 11/02/21 0821 Oral     SpO2 11/02/21 0821 95 %     Weight 11/02/21 0819 131 lb (59.4 kg)     Height 11/02/21 0819 5'  3" (1.6 m)     Head Circumference --      Peak Flow --      Pain Score 11/02/21 0819 0     Pain Loc --      Pain Edu? --      Excl. in [place]? --    No data found.  Updated Vital Signs BP (!) 111/59 (BP Location: Right Arm)   Pulse 65   Temp 98.2 F (36.8 C) (Oral)   Resp 18   Ht 5\' 3"  (1.6 m)   Wt 59.4 kg   SpO2 95%   BMI 23.21 kg/m   Visual Acuity Right Eye Distance:   Left Eye Distance:   Bilateral Distance:    Right Eye Near:   Left Eye Near:    Bilateral Near:     Physical Exam Vitals and nursing note reviewed.  Constitutional:      Appearance: She is well-developed.  HENT:     Head: Normocephalic.  Cardiovascular:     Rate and Rhythm: Normal rate.  Pulmonary:     Effort: Pulmonary effort is normal.     Breath sounds: Normal breath sounds.  Abdominal:     General: There is no distension.  Musculoskeletal:        General: Normal range of motion.     Cervical back: Normal range of motion.  Neurological:     Mental Status: She is alert and oriented to person, place, and time.     UC Treatments / Results  Labs (all labs ordered are listed, but only abnormal results are displayed) Labs Reviewed  COVID-19, FLU A+B NAA    EKG   Radiology No results found.  Procedures Procedures (including critical care time)  Medications Ordered in UC Medications - No data to display  Initial Impression / Assessment and  Plan / UC Course  I have reviewed the triage vital signs and the nursing notes.  Pertinent labs & imaging results that were available during my care of the patient were reviewed by me and considered in my medical decision making (see chart for details).     FLu and covid pendng Final Clinical Impressions(s) / UC Diagnoses   Final diagnoses:  Viral illness     Discharge Instructions      Your flu and covid are pending   ED Prescriptions   None    PDMP not reviewed this encounter.   Fransico Meadow, [place] 11/02/21 [phone]

## 2021-11-03 LAB — COVID-19, FLU A+B NAA
Influenza A, NAA: NOT DETECTED
Influenza B, NAA: NOT DETECTED
SARS-CoV-2, NAA: NOT DETECTED

## 2021-11-05 ENCOUNTER — Ambulatory Visit: Payer: Medicare Other | Admitting: Cardiology

## 2021-12-05 ENCOUNTER — Encounter: Payer: Self-pay | Admitting: Internal Medicine

## 2021-12-05 ENCOUNTER — Other Ambulatory Visit: Payer: Self-pay

## 2021-12-05 ENCOUNTER — Ambulatory Visit (INDEPENDENT_AMBULATORY_CARE_PROVIDER_SITE_OTHER): Payer: Medicare Other | Admitting: Internal Medicine

## 2021-12-05 VITALS — BP 112/58 | HR 60 | Resp 18 | Ht 63.0 in | Wt 136.0 lb

## 2021-12-05 DIAGNOSIS — M810 Age-related osteoporosis without current pathological fracture: Secondary | ICD-10-CM | POA: Diagnosis not present

## 2021-12-05 DIAGNOSIS — I10 Essential (primary) hypertension: Secondary | ICD-10-CM | POA: Diagnosis not present

## 2021-12-05 DIAGNOSIS — E118 Type 2 diabetes mellitus with unspecified complications: Secondary | ICD-10-CM | POA: Diagnosis not present

## 2021-12-05 DIAGNOSIS — E782 Mixed hyperlipidemia: Secondary | ICD-10-CM | POA: Insufficient documentation

## 2021-12-05 DIAGNOSIS — R5381 Other malaise: Secondary | ICD-10-CM | POA: Insufficient documentation

## 2021-12-05 DIAGNOSIS — F321 Major depressive disorder, single episode, moderate: Secondary | ICD-10-CM

## 2021-12-05 DIAGNOSIS — K219 Gastro-esophageal reflux disease without esophagitis: Secondary | ICD-10-CM

## 2021-12-05 DIAGNOSIS — I480 Paroxysmal atrial fibrillation: Secondary | ICD-10-CM

## 2021-12-05 DIAGNOSIS — E039 Hypothyroidism, unspecified: Secondary | ICD-10-CM

## 2021-12-05 HISTORY — DX: Gastro-esophageal reflux disease without esophagitis: K21.9

## 2021-12-05 HISTORY — DX: Major depressive disorder, single episode, moderate: F32.1

## 2021-12-05 LAB — POCT GLYCOSYLATED HEMOGLOBIN (HGB A1C)
HbA1c, POC (controlled diabetic range): 5.6 % (ref 0.0–7.0)
Hemoglobin A1C: 5.6 % (ref 4.0–5.6)

## 2021-12-05 MED ORDER — FAMOTIDINE 20 MG PO TABS: 20.0000 mg | ORAL_TABLET | Freq: Two times a day (BID) | ORAL | 2 refills | Status: DC | PRN

## 2021-12-05 MED ORDER — MIRTAZAPINE 7.5 MG PO TABS
7.5000 mg | ORAL_TABLET | Freq: Every day | ORAL | 2 refills | Status: DC
Start: 2021-12-05 — End: 2022-08-12

## 2021-12-05 NOTE — Assessment & Plan Note (Addendum)
Had DEXA scan in 2006 On vitamin D supplements

## 2021-12-05 NOTE — Assessment & Plan Note (Signed)
Has anhedonia, insomnia and lack of interest in activities Started Remeron 7.5 mg qHS

## 2021-12-05 NOTE — Assessment & Plan Note (Signed)
Started Pepcid PRN Avoid hot and spicy food

## 2021-12-05 NOTE — Assessment & Plan Note (Signed)
BP Readings from Last 1 Encounters:  12/05/21 (!) 112/58   Overall well-controlled for her age 80 for compliance with the medications Advised DASH diet and moderate exercise/walking, at least 150 mins/week

## 2021-12-05 NOTE — Assessment & Plan Note (Signed)
Check TSH and free T4 On Levothyroxine

## 2021-12-05 NOTE — Assessment & Plan Note (Signed)
Has weakness in b/l LE Needs walking support - rolling walker prescribed

## 2021-12-05 NOTE — Patient Instructions (Signed)
Please start taking Pepcid as needed for acid reflux or indigestion.  Please start taking Mirtazepine as prescribed for stress/insomnia.  Please continue to take other medications as prescribed.

## 2021-12-05 NOTE — Assessment & Plan Note (Signed)
Lab Results  Component Value Date   HGBA1C 5.8 (H) 10/02/2020    Diet controlled Advised to follow diabetic diet On statin and ACEi F/u CMP and lipid panel Diabetic foot exam: Today Diabetic eye exam: Advised to follow up with Ophthalmology for diabetic eye exam

## 2021-12-05 NOTE — Assessment & Plan Note (Signed)
On Cardizem and Eliquis Followed by cardiology In sinus rhythm currently

## 2021-12-05 NOTE — Progress Notes (Signed)
Established Patient Office Visit  Subjective:  Patient ID: Tasha Roberts, female    DOB: October 01, 1941  Age: 80 y.o. MRN: [id number]  CC:  Chief Complaint  Patient presents with   Follow-up    4 month follow up pt thinks she needs medication for stress she lost her husband recently and had to move also pt needs meds for acid reflux and she cannot sleep good at night would like to have sugar tested on labs has been awhile would like to have regular walker for home has rollator but this is to big for home     HPI Tasha Roberts is a 80 y.o. female with past medical history of HTN, paroxysmal A Fib, NASH, hypothyroidism, breast ca s/p left mastectomy, iron deficiency anemia, thrombocytopenia, osteoporosis and chronic loose BM who presents for f/u of her chronic medical conditions.  HTN: BP is well-controlled. Takes medications regularly. Patient denies headache, dizziness, chest pain, dyspnea or palpitations.  She has been taking Levothyroxine regularly. She continues to have loose BM, but denies any watery BM, melena or hematochezia. She denies any fever, chills, nausea or vomiting. Of note, she has h/o IBS and has been stressed recently as her son has been hospitalized currently. She also reports anhedonia, anxiety, insomnia and lack of interest in her routine activities since she lost her husband.  She also reports acid reflux, for which she has taken an antacid (name unknown) from her daughter. She denies any dysphagia or odynophagia.  She reports chronic weakness of LE and uses rollator at home, but it is too large to be used at home. She requests a regular walker, which would be more convenient.  Past Medical History:  Diagnosis Date   Anemia due to blood loss    iron infusion's last one 03-25-2021 (pt bleeding post vulvar bx 02-11-2021, hospiatal admission 02-21-2021)   Anticoagulant long-term use    eliquis--- managed by pcp    Arthritis    hands   Bronchiectasis [facility])    pulmonology  --- dr Halford Chessman   (04-01-2021  pt stated no supplemental oxygen use since 01/ 2022 and no inhaler use,  stated checks O2 at home during the day 98-99%)   Cancer ([place]) 2013   Lumpectomy left breast   Cataract 2015   Chronic constipation    Clotting disorder ([place])    On eliquis   Coronary artery calcification    per lexiscan 08-17-2019 result in care everywhere   Depression, major, single episode, moderate ([place]) 12/05/2021   DM II (diabetes mellitus, type II), controlled ([place]) 08/27/2005   DOE (dyspnea on exertion)    04-01-2021  per pt only going up stairs    Gastroesophageal reflux disease 12/05/2021   History of diabetes mellitus, type II    04-01-2021  per pt no issues after weight loss, last taken medication approx 2017   History of Graves' disease 2017   s/p RAI   History of left breast cancer    dx 2013,  s/p left partial mastecotmy w/ node dissection 09-15-2012, low grade DCIS, 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, completed radiation 02/ 2014,  per pt no recurrence   History of respiratory syncytial virus (RSV) infection 09/2020   w/ hypoxia  , hospital admission 10/ 2021  and acute exacerbation bronchiectasis;  follow up post hospital pulmonogy dr Halford Chessman  note in epic   Hyperlipidemia    Hypertension    followed by pcp   (pt had lexiscan in care everywhere dated 08-17-2019 no ischemia , non-obstructive extensive  calcification, ef 69%)   Hypothyroidism    Hypothyroidism, postradioiodine therapy    followed by pcp   Liver cirrhosis secondary to NASH (nonalcoholic steatohepatitis) ([place])    followed by dr Merrilee Jansky ([facility])---- dx 2004,  compensated ,  last liver bx (03/ 2015) fibrosis stage 3;  moderate portal hypertensive gastrophy   PAF (paroxysmal atrial fibrillation) ([place]) previous cardiologist lov note w/ Suann Larry PA ([facility] [place]) dated 01-24-2020 in care everywhere;  (04-01-2021 per pt has appt w/ new cardiologist , dr j. 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 Delton Coombes   VIN III (vulvar intraepithelial neoplasia III)     Past Surgical History:  Procedure Laterality Date   [place]   BREAST SURGERY  09/15/2012   CATARACT EXTRACTION W/ INTRAOCULAR LENS  IMPLANT, BILATERAL  2016   CHOLECYSTECTOMY OPEN  7517   CO2 LASER APPLICATION N/A 00/17/4944   Procedure: CO2 LASER APPLICATION OF THE VULVA;  Surgeon: Everitt Amber, MD;  Location: [place];  Service: Gynecology;  Laterality: N/A;   COLONOSCOPY WITH ESOPHAGOGASTRODUODENOSCOPY (EGD)  last one 08/ 2018  $[facility]    EYE SURGERY  2015   Cataract removed both eyes   LUMBAR LAMINECTOMY  2009   PARTIAL MASTECTOMY WITH AXILLARY SENTINEL LYMPH NODE BIOPSY Left 09-15-2012   [place]   TUBAL LIGATION  08/08/1969    Family History  Problem Relation Age of Onset   Hypertension Mother    Dementia Mother    Vision loss Mother    Heart disease Father    Hearing loss Father    Stroke Maternal Grandfather    Heart disease Brother     Social History   Socioeconomic History   Marital status: Widowed    Spouse name: Not on file   Number of children: 3   Years of education: Not on file   Highest education level: Not on file  Occupational History   Occupation: retired  Tobacco Use   Smoking status: Never   Smokeless tobacco: Never  Vaping Use   Vaping Use: Never used  Substance and Sexual Activity   Alcohol  use: Not Currently   Drug use: Never   Sexual activity: Not Currently    Birth control/protection: Post-menopausal  Other Topics Concern   Not on file  Social History Narrative   Not on file   Social Determinants of Health   Financial Resource Strain: Low Risk    Difficulty of Paying Living Expenses: Not hard at all  Food Insecurity: No Food Insecurity   Worried About Charity fundraiser in the Last Year: Never true   [place] in the Last Year: Never true  Transportation Needs: No Transportation Needs   Lack of Transportation (Medical): No   Lack of Transportation (Non-Medical): No  Physical Activity: Insufficiently Active   Days of Exercise per  Week: 3 days   Minutes of Exercise per Session: 30 min  Stress: No Stress Concern Present   Feeling of Stress : Not at all  Social Connections: Moderately Isolated   Frequency of Communication with Friends and Family: More than three times a week   Frequency of Social Gatherings with Friends and Family: More than three times a week   Attends Religious Services: More than 4 times per year   Active Member of [employer] or Organizations: No   Attends Archivist Meetings: Never   Marital Status: Widowed  Human resources officer Violence: Not At Risk   Fear of Current or Ex-Partner: No   Emotionally Abused: No   Physically Abused: No   Sexually Abused: No    Outpatient Medications Prior to Visit  Medication Sig Dispense Refill   apixaban (ELIQUIS) 5 MG TABS tablet TAKE (1) TABLET BY MOUTH TWICE DAILY. 180 tablet 1   atorvastatin (LIPITOR) 20 MG tablet Take 1 tablet (20 mg total) by mouth daily. 90 tablet 1   chlorhexidine (HIBICLENS) 4 % external liquid Apply topically daily as needed. 120 mL 0   Cholecalciferol 50 MCG (2000 UT) CAPS Take 2,000 mg by mouth daily.     diltiazem (CARDIZEM CD) 120 MG 24 hr capsule TAKE (1) CAPSULE BY MOUTH ONCE DAILY. 90 capsule 1   levothyroxine (SYNTHROID) 100 MCG tablet TAKE 1 TABLET BY MOUTH ON  AN EMPTY STOMACH 30 MINUTES BEFORE BREAKFAST. 90 tablet 0   lisinopril-hydrochlorothiazide (ZESTORETIC) 20-25 MG tablet TAKE (1) TABLET BY MOUTH EACH MORNING. 90 tablet 0   mupirocin ointment (BACTROBAN) 2 % Apply 1 application topically 2 (two) times daily. 22 g 0   nitroGLYCERIN (NITROSTAT) 0.4 MG SL tablet Place 1 tablet (0.4 mg total) under the tongue every 5 (five) minutes as needed for chest pain. 10 tablet 0   ursodiol (ACTIGALL) 250 MG tablet Take 250 mg by mouth 2 (two) times daily.     doxycycline (VIBRAMYCIN) 100 MG capsule Take 1 capsule (100 mg total) by mouth 2 (two) times daily. 20 capsule 0   cephALEXin (KEFLEX) 500 MG capsule Take 1 capsule (500 mg total) by mouth 2 (two) times daily. (Patient not taking: Reported on 12/05/2021) 10 capsule 0   No facility-administered medications prior to visit.    Allergies  Allergen Reactions   Dicyclomine Other (See Comments)    Dry cough   Glucophage [Metformin] Diarrhea   Losartan Other (See Comments)    Heartburn     ROS Review of Systems  Constitutional:  Negative for chills and fever.  HENT:  Negative for congestion, sinus pressure, sinus pain and sore throat.   Eyes:  Negative for pain and discharge.  Respiratory:  Negative for cough and shortness of breath.   Cardiovascular:  Negative for chest pain and palpitations.  Gastrointestinal:  Positive for diarrhea. Negative for constipation, nausea and vomiting.  Endocrine: Negative for polydipsia and polyuria.  Genitourinary:  Negative for dysuria and hematuria.  Musculoskeletal:  Positive for back pain. Negative for neck pain and neck stiffness.  Skin:  Negative for rash.  Neurological:  Positive for weakness. Negative for dizziness.  Psychiatric/Behavioral:  Positive for dysphoric mood and sleep disturbance. Negative for agitation and behavioral problems. The patient is nervous/anxious.      Objective:    Physical Exam Vitals reviewed.  Constitutional:      General:  She is not in acute distress.    Appearance: She is not diaphoretic.  HENT:  Head: Normocephalic and atraumatic.     Nose: Nose normal.     Mouth/Throat:     Mouth: Mucous membranes are moist.  Eyes:     General: No scleral icterus.    Extraocular Movements: Extraocular movements intact.  Neck:     Vascular: Carotid bruit present.  Cardiovascular:     Rate and Rhythm: Normal rate and regular rhythm.     Pulses: Normal pulses.     Heart sounds: Normal heart sounds. No murmur heard. Pulmonary:     Breath sounds: Normal breath sounds. No wheezing or rales.  Abdominal:     Palpations: Abdomen is soft.     Tenderness: There is no abdominal tenderness.  Musculoskeletal:     Cervical back: Neck supple. No tenderness.     Right lower leg: No edema.     Left lower leg: No edema.  Skin:    General: Skin is warm.     Findings: No rash.  Neurological:     General: No focal deficit present.     Mental Status: She is alert and oriented to person, place, and time.     Sensory: No sensory deficit.     Motor: Weakness (B/l LE - 4/5) present.  Psychiatric:        Mood and Affect: Mood normal.        Behavior: Behavior normal.    BP (!) 112/58 (BP Location: Left Arm, Patient Position: Sitting, Cuff Size: Normal)    Pulse 60    Resp 18    Ht $R'5\' 3"'fe$  (1.6 m)    Wt 136 lb 0.6 oz (61.7 kg)    SpO2 96%    BMI 24.10 kg/m  Wt Readings from Last 3 Encounters:  12/05/21 136 lb 0.6 oz (61.7 kg)  11/02/21 131 lb (59.4 kg)  08/08/21 132 lb (59.9 kg)    Lab Results  Component Value Date   TSH 0.771 07/31/2021   Lab Results  Component Value Date   WBC 5.8 07/31/2021   HGB 12.9 07/31/2021   HCT 36.7 07/31/2021   MCV 90 07/31/2021   PLT 133 (L) 07/31/2021   Lab Results  Component Value Date   NA 143 07/31/2021   K 3.8 07/31/2021   CO2 23 07/31/2021   GLUCOSE 97 07/31/2021   BUN 20 07/31/2021   CREATININE 0.98 07/31/2021   BILITOT 0.7 07/31/2021   ALKPHOS 80 07/31/2021   AST 23  07/31/2021   ALT 15 07/31/2021   PROT 6.1 07/31/2021   ALBUMIN 4.3 07/31/2021   CALCIUM 9.6 07/31/2021   ANIONGAP 8 04/04/2021   EGFR 59 (L) 07/31/2021   No results found for: CHOL No results found for: HDL No results found for: LDLCALC No results found for: TRIG No results found for: CHOLHDL Lab Results  Component Value Date   HGBA1C 5.6 12/05/2021   HGBA1C 5.6 12/05/2021      Assessment & Plan:   Problem List Items Addressed This Visit       Cardiovascular and Mediastinum   AF (paroxysmal atrial fibrillation) (HCC)    On Cardizem and Eliquis Followed by cardiology In sinus rhythm currently      Essential hypertension - Primary    BP Readings from Last 1 Encounters:  12/05/21 (!) 112/58  Overall well-controlled for her age 78 for compliance with the medications Advised DASH diet and moderate exercise/walking, at least 150 mins/week        Digestive   Gastroesophageal reflux disease  Started Pepcid PRN Avoid hot and spicy food      Relevant Medications   famotidine (PEPCID) 20 MG tablet     Endocrine   Acquired hypothyroidism    Check TSH and free T4 On Levothyroxine      Relevant Orders   TSH + free T4   DM II (diabetes mellitus, type II), controlled ([place])    Lab Results  Component Value Date   HGBA1C 5.8 (H) 10/02/2020   Diet controlled Advised to follow diabetic diet On statin and ACEi F/u CMP and lipid panel Diabetic foot exam: Today Diabetic eye exam: Advised to follow up with Ophthalmology for diabetic eye exam       Relevant Orders   POCT glycosylated hemoglobin (Hb A1C) (Completed)   Hemoglobin A1c   CMP14+EGFR     Musculoskeletal and Integument   Age related osteoporosis    Had DEXA scan in 2006 On vitamin D supplements      Relevant Orders   Walker standard     Other   Depression, major, single episode, moderate (HCC)    Has anhedonia, insomnia and lack of interest in activities Started Remeron 7.5 mg qHS       Relevant Medications   mirtazapine (REMERON) 7.5 MG tablet   Other Relevant Orders   CMP14+EGFR   CBC with Differential/Platelet   Physical deconditioning    Has weakness in b/l LE Needs walking support - rolling walker prescribed      Mixed hyperlipidemia    On statin Check lipid profile      Relevant Orders   Lipid panel    Meds ordered this encounter  Medications   mirtazapine (REMERON) 7.5 MG tablet    Sig: Take 1 tablet (7.5 mg total) by mouth at bedtime.    Dispense:  30 tablet    Refill:  2   famotidine (PEPCID) 20 MG tablet    Sig: Take 1 tablet (20 mg total) by mouth 2 (two) times daily as needed for heartburn or indigestion.    Dispense:  60 tablet    Refill:  2    Follow-up: Return in about 4 months (around 04/05/2022) for HTN and hypothyroidism.    Lindell Spar, MD

## 2021-12-05 NOTE — Assessment & Plan Note (Signed)
On statin Check lipid profile 

## 2021-12-16 ENCOUNTER — Inpatient Hospital Stay (HOSPITAL_COMMUNITY): Payer: Medicare Other | Attending: Hematology

## 2021-12-16 ENCOUNTER — Other Ambulatory Visit: Payer: Self-pay

## 2021-12-16 DIAGNOSIS — I2510 Atherosclerotic heart disease of native coronary artery without angina pectoris: Secondary | ICD-10-CM | POA: Diagnosis not present

## 2021-12-16 DIAGNOSIS — E538 Deficiency of other specified B group vitamins: Secondary | ICD-10-CM | POA: Insufficient documentation

## 2021-12-16 DIAGNOSIS — R197 Diarrhea, unspecified: Secondary | ICD-10-CM | POA: Insufficient documentation

## 2021-12-16 DIAGNOSIS — R42 Dizziness and giddiness: Secondary | ICD-10-CM | POA: Insufficient documentation

## 2021-12-16 DIAGNOSIS — R519 Headache, unspecified: Secondary | ICD-10-CM | POA: Diagnosis not present

## 2021-12-16 DIAGNOSIS — E119 Type 2 diabetes mellitus without complications: Secondary | ICD-10-CM | POA: Diagnosis not present

## 2021-12-16 DIAGNOSIS — Z923 Personal history of irradiation: Secondary | ICD-10-CM | POA: Diagnosis not present

## 2021-12-16 DIAGNOSIS — D509 Iron deficiency anemia, unspecified: Secondary | ICD-10-CM | POA: Insufficient documentation

## 2021-12-16 DIAGNOSIS — K219 Gastro-esophageal reflux disease without esophagitis: Secondary | ICD-10-CM | POA: Diagnosis not present

## 2021-12-16 DIAGNOSIS — R109 Unspecified abdominal pain: Secondary | ICD-10-CM | POA: Insufficient documentation

## 2021-12-16 DIAGNOSIS — D500 Iron deficiency anemia secondary to blood loss (chronic): Secondary | ICD-10-CM

## 2021-12-16 DIAGNOSIS — Z79899 Other long term (current) drug therapy: Secondary | ICD-10-CM | POA: Insufficient documentation

## 2021-12-16 DIAGNOSIS — K5900 Constipation, unspecified: Secondary | ICD-10-CM | POA: Diagnosis not present

## 2021-12-16 DIAGNOSIS — K7460 Unspecified cirrhosis of liver: Secondary | ICD-10-CM | POA: Insufficient documentation

## 2021-12-16 DIAGNOSIS — Z853 Personal history of malignant neoplasm of breast: Secondary | ICD-10-CM | POA: Diagnosis not present

## 2021-12-16 DIAGNOSIS — E785 Hyperlipidemia, unspecified: Secondary | ICD-10-CM | POA: Diagnosis not present

## 2021-12-16 DIAGNOSIS — Z821 Family history of blindness and visual loss: Secondary | ICD-10-CM | POA: Insufficient documentation

## 2021-12-16 DIAGNOSIS — I10 Essential (primary) hypertension: Secondary | ICD-10-CM | POA: Insufficient documentation

## 2021-12-16 DIAGNOSIS — E039 Hypothyroidism, unspecified: Secondary | ICD-10-CM | POA: Insufficient documentation

## 2021-12-16 DIAGNOSIS — I480 Paroxysmal atrial fibrillation: Secondary | ICD-10-CM | POA: Diagnosis not present

## 2021-12-16 DIAGNOSIS — D696 Thrombocytopenia, unspecified: Secondary | ICD-10-CM | POA: Diagnosis not present

## 2021-12-16 DIAGNOSIS — F32A Depression, unspecified: Secondary | ICD-10-CM | POA: Diagnosis not present

## 2021-12-16 DIAGNOSIS — R531 Weakness: Secondary | ICD-10-CM | POA: Diagnosis not present

## 2021-12-16 DIAGNOSIS — K7581 Nonalcoholic steatohepatitis (NASH): Secondary | ICD-10-CM | POA: Insufficient documentation

## 2021-12-16 DIAGNOSIS — R161 Splenomegaly, not elsewhere classified: Secondary | ICD-10-CM | POA: Diagnosis not present

## 2021-12-16 DIAGNOSIS — Z823 Family history of stroke: Secondary | ICD-10-CM | POA: Insufficient documentation

## 2021-12-16 DIAGNOSIS — Z818 Family history of other mental and behavioral disorders: Secondary | ICD-10-CM | POA: Insufficient documentation

## 2021-12-16 DIAGNOSIS — Z7901 Long term (current) use of anticoagulants: Secondary | ICD-10-CM | POA: Insufficient documentation

## 2021-12-16 DIAGNOSIS — Z8249 Family history of ischemic heart disease and other diseases of the circulatory system: Secondary | ICD-10-CM | POA: Insufficient documentation

## 2021-12-16 DIAGNOSIS — Z90721 Acquired absence of ovaries, unilateral: Secondary | ICD-10-CM | POA: Insufficient documentation

## 2021-12-16 DIAGNOSIS — Z9049 Acquired absence of other specified parts of digestive tract: Secondary | ICD-10-CM | POA: Insufficient documentation

## 2021-12-16 LAB — CBC WITH DIFFERENTIAL/PLATELET
Abs Immature Granulocytes: 0.02 10*3/uL (ref 0.00–0.07)
Basophils Absolute: 0 10*3/uL (ref 0.0–0.1)
Basophils Relative: 1 %
Eosinophils Absolute: 0.1 10*3/uL (ref 0.0–0.5)
Eosinophils Relative: 2 %
HCT: 33.7 % — ABNORMAL LOW (ref 36.0–46.0)
Hemoglobin: 11 g/dL — ABNORMAL LOW (ref 12.0–15.0)
Immature Granulocytes: 0 %
Lymphocytes Relative: 31 %
Lymphs Abs: 1.7 10*3/uL (ref 0.7–4.0)
MCH: 29.6 pg (ref 26.0–34.0)
MCHC: 32.6 g/dL (ref 30.0–36.0)
MCV: 90.8 fL (ref 80.0–100.0)
Monocytes Absolute: 0.4 10*3/uL (ref 0.1–1.0)
Monocytes Relative: 7 %
Neutro Abs: 3.3 10*3/uL (ref 1.7–7.7)
Neutrophils Relative %: 59 %
Platelets: 155 10*3/uL (ref 150–400)
RBC: 3.71 MIL/uL — ABNORMAL LOW (ref 3.87–5.11)
RDW: 14.6 % (ref 11.5–15.5)
WBC: 5.5 10*3/uL (ref 4.0–10.5)
nRBC: 0 % (ref 0.0–0.2)

## 2021-12-16 LAB — IRON AND TIBC
Iron: 83 ug/dL (ref 28–170)
Saturation Ratios: 21 % (ref 10.4–31.8)
TIBC: 400 ug/dL (ref 250–450)
UIBC: 317 ug/dL

## 2021-12-16 LAB — FERRITIN: Ferritin: 52 ng/mL (ref 11–307)

## 2021-12-16 LAB — VITAMIN B12: Vitamin B-12: 389 pg/mL (ref 180–914)

## 2021-12-19 LAB — METHYLMALONIC ACID, SERUM: Methylmalonic Acid, Quantitative: 711 nmol/L — ABNORMAL HIGH (ref 0–378)

## 2021-12-20 NOTE — Progress Notes (Signed)
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  Tasha Roberts was last evaluated via telemedicine visit by Tarri Abernethy PA-C on 08/08/2021.  At today's visit, Tasha Roberts reports feeling fair.  No recent hospitalizations, surgeries, or changes in baseline health status.  Tasha Roberts reports that Tasha Roberts feels significantly fatigued, with energy about 25%.  Tasha Roberts does continue to have intermittent black and tarry bowel movements.  Most recently, Tasha Roberts had an episode of melena last week, which resolved within 24 hours.  Tasha Roberts remains on Eliquis for atrial fibrillation.  Tasha Roberts admits to easy bruising but denies any petechial rash.  Tasha Roberts has occasional lightheadedness and headaches.  Tasha Roberts denies any chest pain, dyspnea on exertion, syncope.  No B symptoms such as fever, chills, night sweats, unintentional weight loss.  Tasha Roberts does not recall having ever tried any oral iron supplementation.  Tasha Roberts is not taking any B12 supplements at this time.  Tasha Roberts has 25% energy and 50% appetite. Tasha Roberts endorses that Tasha Roberts is maintaining a stable weight.   REVIEW OF SYSTEMS:  Review of Systems  Constitutional:  Positive for fatigue. Negative for appetite change, chills, diaphoresis, fever and unexpected weight change.  HENT:   Negative for lump/mass and nosebleeds.   Eyes:  Negative for eye problems.  Respiratory:  Negative for cough, hemoptysis and shortness of breath.   Cardiovascular:  Negative for chest pain, leg  swelling and palpitations.  Gastrointestinal:  Positive for abdominal pain, constipation and diarrhea. Negative for blood in stool, nausea and vomiting.  Genitourinary:  Positive for bladder incontinence. Negative for hematuria.   Skin: Negative.   Neurological:  Positive for headaches and numbness. Negative for dizziness and light-headedness.  Hematological:  Does not bruise/bleed easily.  Psychiatric/Behavioral:  Positive for depression.      PAST MEDICAL/SURGICAL HISTORY:  Past Medical History:  Diagnosis Date   Anemia due to blood loss    iron infusion's last one 03-25-2021 (pt bleeding post vulvar bx 02-11-2021, hospiatal admission 02-21-2021)   Anticoagulant long-term use    eliquis--- managed by pcp    Arthritis    hands   Bronchiectasis [facility])    pulmonology --- dr Halford Chessman   (04-01-2021  pt stated no supplemental oxygen use since 01/ 2022 and no inhaler use,  stated checks O2 at home during the day 98-99%)   Cancer ([place]) 2013   Lumpectomy left breast   Cataract 2015   Chronic constipation    Clotting disorder ([place])    On eliquis   Coronary artery calcification    per lexiscan 08-17-2019 result in care everywhere   Depression, major, single episode, moderate ([place]) 12/05/2021   DM II (diabetes mellitus, type II), controlled ([place]) 08/27/2005   DOE (dyspnea on exertion)    04-01-2021  per pt only going up stairs    Gastroesophageal reflux disease 12/05/2021   History of diabetes mellitus, type II    04-01-2021  per pt no issues after weight loss, last taken medication approx 2017   History of Graves' disease 2017  s/p RAI   History of left breast cancer    dx 2013,  s/p left partial mastecotmy w/ node dissection 09-15-2012, low grade DCIS, no chemo, completed radiation 02/ 2014,  per pt no recurrence   History of respiratory syncytial virus (RSV) infection 09/2020   w/ hypoxia  , hospital admission 10/ 2021  and acute exacerbation bronchiectasis;  follow up post hospital  pulmonogy dr Halford Chessman  note in epic   Hyperlipidemia    Hypertension    followed by pcp   (pt had lexiscan in care everywhere dated 08-17-2019 no ischemia , non-obstructive extensive calcification, ef 69%)   Hypothyroidism    Hypothyroidism, postradioiodine therapy    followed by pcp   Liver cirrhosis secondary to NASH (nonalcoholic steatohepatitis) ([place])    followed by dr Merrilee Jansky ([facility])---- dx 2004,  compensated ,  last liver bx (03/ 2015) fibrosis stage 3;  moderate portal hypertensive gastrophy   PAF (paroxysmal atrial fibrillation) ([place]) previous cardiologist lov note w/ Suann Larry PA ([facility] in [place]) dated 01-24-2020 in care everywhere;  (04-01-2021 per pt has appt w/ new cardiologist , dr j. 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 Delton Coombes   VIN III (vulvar intraepithelial neoplasia III)    Past Surgical History:  Procedure Laterality Date   [place]   BREAST SURGERY  09/15/2012   CATARACT EXTRACTION W/ INTRAOCULAR LENS  IMPLANT, BILATERAL  2016   CHOLECYSTECTOMY OPEN  8546   CO2 LASER APPLICATION N/A 27/02/5008   Procedure: CO2 LASER APPLICATION OF THE VULVA;  Surgeon: Everitt Amber, MD;  Location: [place];  Service: Gynecology;  Laterality: N/A;   COLONOSCOPY WITH ESOPHAGOGASTRODUODENOSCOPY (EGD)  last one 08/ 2018  @[facility]    EYE SURGERY  2015   Cataract  removed both eyes   LUMBAR LAMINECTOMY  2009   PARTIAL MASTECTOMY WITH AXILLARY SENTINEL LYMPH NODE BIOPSY Left 09-15-2012   [facility]   TUBAL LIGATION  08/08/1969     SOCIAL HISTORY:  Social History   Socioeconomic History   Marital status: Widowed    Spouse name: Not on file   Number of children: 3   Years of education: Not on file   Highest education level: Not on file  Occupational History   Occupation: retired  Tobacco Use   Smoking status: Never   Smokeless tobacco: Never  Vaping Use   Vaping Use: Never used  Substance and Sexual Activity   Alcohol use: Not Currently   Drug use: Never   Sexual activity: Not Currently    Birth control/protection: Post-menopausal  Other Topics Concern   Not on file  Social History Narrative   Not on file   Social Determinants of Health   Financial Resource Strain: Low Risk    Difficulty of Paying Living Expenses: Not hard at all  Food Insecurity: No Food Insecurity   Worried  About Running Out of Food in the Last Year: Never true   Ran Out of Food in the Last Year: Never true  Transportation Needs: No Transportation Needs   Lack of Transportation (Medical): No   Lack of Transportation (Non-Medical): No  Physical Activity: Insufficiently Active   Days of Exercise per Week: 3 days   Minutes of Exercise per Session: 30 min  Stress: No Stress Concern Present   Feeling of Stress : Not at all  Social Connections: Moderately Isolated   Frequency of Communication with Friends and Family: More than three times a week   Frequency of Social Gatherings with Friends and Family: More than three times a week   Attends Religious Services: More than 4 times per year   Active Member of [employer] or Organizations: No   Attends Archivist Meetings: Never   Marital Status: Widowed  Human resources officer Violence: Not At Risk   Fear of Current or Ex-Partner: No   Emotionally Abused: No   Physically Abused: No   Sexually Abused: No    FAMILY  HISTORY:  Family History  Problem Relation Age of Onset   Hypertension Mother    Dementia Mother    Vision loss Mother    Heart disease Father    Hearing loss Father    Stroke Maternal Grandfather    Heart disease Brother     CURRENT MEDICATIONS:  Outpatient Encounter Medications as of 12/23/2021  Medication Sig   apixaban (ELIQUIS) 5 MG TABS tablet TAKE (1) TABLET BY MOUTH TWICE DAILY.   atorvastatin (LIPITOR) 20 MG tablet Take 1 tablet (20 mg total) by mouth daily.   chlorhexidine (HIBICLENS) 4 % external liquid Apply topically daily as needed.   Cholecalciferol 50 MCG (2000 UT) CAPS Take 2,000 mg by mouth daily.   diltiazem (CARDIZEM CD) 120 MG 24 hr capsule TAKE (1) CAPSULE BY MOUTH ONCE DAILY.   famotidine (PEPCID) 20 MG tablet Take 1 tablet (20 mg total) by mouth 2 (two) times daily as needed for heartburn or indigestion.   levothyroxine (SYNTHROID) 100 MCG tablet TAKE 1 TABLET BY MOUTH ON AN EMPTY STOMACH 30 MINUTES BEFORE BREAKFAST.   lisinopril-hydrochlorothiazide (ZESTORETIC) 20-25 MG tablet TAKE (1) TABLET BY MOUTH EACH MORNING.   mirtazapine (REMERON) 7.5 MG tablet Take 1 tablet (7.5 mg total) by mouth at bedtime.   mupirocin ointment (BACTROBAN) 2 % Apply 1 application topically 2 (two) times daily.   nitroGLYCERIN (NITROSTAT) 0.4 MG SL tablet Place 1 tablet (0.4 mg total) under the tongue every 5 (five) minutes as needed for chest pain.   ursodiol (ACTIGALL) 250 MG tablet Take 250 mg by mouth 2 (two) times daily.   No facility-administered encounter medications on file as of 12/23/2021.    ALLERGIES:  Allergies  Allergen Reactions   Dicyclomine Other (See Comments)    Dry cough   Glucophage [Metformin] Diarrhea   Losartan Other (See Comments)    Heartburn      PHYSICAL EXAM:  ECOG PERFORMANCE STATUS: 2 - Symptomatic, <50% confined to bed  There were no vitals filed for this visit. There were no vitals filed for this visit. Physical Exam Constitutional:       Appearance: Normal appearance.  HENT:     Head: Normocephalic and atraumatic.     Mouth/Throat:     Mouth: Mucous membranes are moist.  Eyes:     Extraocular Movements: Extraocular movements intact.     Pupils: Pupils are equal, round, and reactive to light.  Cardiovascular:     Rate and Rhythm: Normal rate. Rhythm irregular.     Pulses: Normal pulses.     Heart sounds: Normal heart sounds.  Pulmonary:     Effort: Pulmonary effort is normal.     Breath sounds: Normal breath sounds.  Abdominal:     General: Bowel sounds are normal.     Palpations: Abdomen is soft.     Tenderness: There is no abdominal tenderness.  Musculoskeletal:        General: No swelling.     Right lower leg: No edema.     Left lower leg: No edema.  Lymphadenopathy:     Cervical: No cervical adenopathy.  Skin:    General: Skin is warm and dry.     Findings: Bruising present.     Comments: Hyperpigmentation of bilateral lower extremities  Neurological:     General: No focal deficit present.     Mental Status: Tasha Roberts is alert and oriented to person, place, and time.  Psychiatric:        Mood and Affect: Mood normal.        Behavior: Behavior normal.     LABORATORY DATA:  I have reviewed the labs as listed.  CBC    Component Value Date/Time   WBC 5.5 12/16/2021 0920   RBC 3.71 (L) 12/16/2021 0920   HGB 11.0 (L) 12/16/2021 0920   HGB 12.9 07/31/2021 1429   HCT 33.7 (L) 12/16/2021 0920   HCT 36.7 07/31/2021 1429   PLT 155 12/16/2021 0920   PLT 133 (L) 07/31/2021 1429   MCV 90.8 12/16/2021 0920   MCV 90 07/31/2021 1429   MCH 29.6 12/16/2021 0920   MCHC 32.6 12/16/2021 0920   RDW 14.6 12/16/2021 0920   RDW 14.0 07/31/2021 1429   LYMPHSABS 1.7 12/16/2021 0920   LYMPHSABS 1.1 07/31/2021 1429   MONOABS 0.4 12/16/2021 0920   EOSABS 0.1 12/16/2021 0920   EOSABS 0.1 07/31/2021 1429   BASOSABS 0.0 12/16/2021 0920   BASOSABS 0.0 07/31/2021 1429   CMP Latest Ref Rng & Units 07/31/2021 04/04/2021  04/02/2021  Glucose 65 - 99 mg/dL 97 97 115(H)  BUN 8 - 27 mg/dL 20 30(H) 25(H)  Creatinine 0.57 - 1.00 mg/dL 0.98 0.92 0.99  Sodium 134 - 144 mmol/L 143 142 147(H)  Potassium 3.5 - 5.2 mmol/L 3.8 3.9 4.0  Chloride 96 - 106 mmol/L 105 110 111  CO2 20 - 29 mmol/L 23 24 26   Calcium 8.7 - 10.3 mg/dL 9.6 9.3 9.4  Total Protein 6.0 - 8.5 g/dL 6.1 6.4(L) 6.7  Total Bilirubin 0.0 - 1.2 mg/dL 0.7 0.7 0.6  Alkaline Phos 44 - 121 IU/L 80 73 72  AST 0 - 40 IU/L 23 32 39  ALT 0 - 32 IU/L 15 28 33    DIAGNOSTIC IMAGING:  I have independently reviewed the relevant imaging and discussed with the patient.  ASSESSMENT & PLAN: 1.  Thrombocytopenia, mild to moderate - Ongoing problem since at least 2016 (platelets 74) - has been ranging from mild to moderate thrombocytopenia for the past 5 years - Likely secondary to NASH cirrhosis and splenomegaly - CT abdomen (04/30/[facility] at [facility]) showed cirrhosis of the liver without evidence of HCC; splenomegaly and small caliber upper abdominal venous collaterals were noted - Work-up to rule out other causes of thrombocytopenia was unremarkable:  Vitamin D normal at 57.86, copper slightly decreased at 77, methylmalonic acid normal 298; hepatitis panel and HIV nonreactive ANA and rheumatoid factor normal.  Recently checked (08/30/3006) folic acid and M22 were within normal  - Most recent labs (12/16/2021): Platelets 155 - Admits to easy bruising, but denies petechial rash - PLAN: No indication for treatment at this time.  Continue to monitor with repeat CBC and RTC in 3 months.   2.  Normocytic anemia - Likely secondary to longstanding chronic disease, occult GI blood loss, and recent postoperative bleeding after vulvar biopsy while on Eliquis - Most recent EGD (06/11/2021 at [facility]) showed normal stomach, esophagus, and duodenum - Episode of blood loss from postop bleeding in 03/19/21 following vulvar biopsy (02/11/2021) - resolved after laser cauterization - No history of  blood transfusions - Tasha Roberts is on Eliquis for atrial fibrillation   - Work-up for other sources of anemia was negative - normal SPEP, normal light chain ratio, normal haptoglobin, appropriately elevated erythropoietin - Tasha Roberts has never tried any oral iron supplementation - Received Feraheme x2 (03/15/2021 and 03/25/2021) - Reports intermittent melena for the past 2 to 3 years, last episode was a week ago and resolved within 24 hours.  Tasha Roberts follows with GI and has made them aware of her melena.   - Tasha Roberts is symptomatic with significant fatigue - Most recent labs (12/16/2021): Hgb 11.0 with MCV 90.8, ferritin 52, iron saturation 21 %, creatinine 0.98 - PLAN: Recommend IV Feraheme x2. - Recommend the patient start taking oral iron supplement to see if this will decrease her need for IV iron infusions - Continue to monitor with repeat labs and RTC in 3 months.   3.  Vitamin B12 deficiency - Labs (12/16/2021) showed normal B12 at 389, but elevated methylmalonic acid 711 - Tasha Roberts is not currently taking any B12 supplementation.   - PLAN: Patient instructed to start taking oral B12 cyanocobalamin 1000 mcg daily.  We will recheck B12/methylmalonic acid in 3 months.  4.  Left breast DCIS: - Lumpectomy on 09/15/2012-DCIS 3 x 2 cm, negative for invasive malignancy, 0/2 sentinel lymph nodes positive.  ER/PR positive. - Completed XRT on 01/23/2013. - Tasha Roberts does not recollect taking any tamoxifen or anastrazole - Bilateral diagnostic mammogram with left ultrasound on 08/02/2019 was benign. - Previously followed with the breast care center at [facility] in [place], [place] - recently relocated to [facility].  (External notes from NP Johnsie Kindred reviewed by me) - Mammogram (03/15/2021) was BI-RADS Category 2, benign, no mammographic evidence of malignancy - PLAN: Tasha Roberts is due for repeat mammogram next year (April 2023), which can be ordered either by our clinic or by her PCP, per patient preference.     5.   Social history -Patient is a lifelong non-smoker.  No tobacco or illicit drug use. -Tasha Roberts ambulates with a Rollator due to generalized weakness. -Tasha Roberts currently lives at home with her daughter.  Patient's husband recently passed away in March 19, 2021 -Patient retired from work as a Barista.   All questions were answered. The patient knows to call the clinic with any problems, questions or concerns.  Medical decision making: Moderate  Time spent on visit: I spent 20 minutes counseling the patient face to face. The total time spent in the appointment was 30 minutes and more than 50% was on counseling.   Harriett Rush, PA-C  12/23/21 10:22 AM

## 2021-12-23 ENCOUNTER — Inpatient Hospital Stay (HOSPITAL_BASED_OUTPATIENT_CLINIC_OR_DEPARTMENT_OTHER): Payer: Medicare Other | Admitting: Physician Assistant

## 2021-12-23 ENCOUNTER — Other Ambulatory Visit: Payer: Self-pay

## 2021-12-23 VITALS — BP 156/49 | HR 84 | Resp 17 | Wt 139.3 lb

## 2021-12-23 DIAGNOSIS — D500 Iron deficiency anemia secondary to blood loss (chronic): Secondary | ICD-10-CM | POA: Diagnosis not present

## 2021-12-23 DIAGNOSIS — E538 Deficiency of other specified B group vitamins: Secondary | ICD-10-CM

## 2021-12-23 DIAGNOSIS — D696 Thrombocytopenia, unspecified: Secondary | ICD-10-CM | POA: Diagnosis not present

## 2021-12-23 DIAGNOSIS — D509 Iron deficiency anemia, unspecified: Secondary | ICD-10-CM | POA: Diagnosis not present

## 2021-12-23 NOTE — Patient Instructions (Addendum)
[place] at [facility] Discharge Instructions  You were seen today by Tarri Abernethy PA-C for your low platelets (thrombocytopenia) and low blood count (iron deficiency anemia).    LABS: Repeat labs in 3 months  OTHER TESTS: Your next mammogram is due in April 2023.  This can be ordered by your primary care provider or by our office.  Make sure that you are also receiving an annual breast exam from your primary care provider.  If they are not performing this, we can do this at your next visit.  MEDICATIONS: - Start taking daily iron supplement (any over-the-counter iron supplement will be fine) - stop iron if I causes significant constipation or stomach upset. - Make appointment for IV iron (Feraheme) x 2 - Start taking vitamin B12 cyanocobalamin 1000 mcg daily  FOLLOW-UP APPOINTMENT: Return for follow-up with Tarri Abernethy, PA-C in 3 months   Thank you for choosing [place] at [facility] to provide your oncology and hematology care.  To afford each patient quality time with our provider, please arrive at least 15 minutes before your scheduled appointment time.   If you have a lab appointment with the [facility] please come in thru the Main Entrance and check in at the main information desk.  You need to re-schedule your appointment should you arrive 10 or more minutes late.  We strive to give you quality time with our providers, and arriving late affects you and other patients whose appointments are after yours.  Also, if you no show three or more times for appointments you may be dismissed from the clinic at the providers discretion.     Again, thank you for choosing [facility].  Our hope is that these requests will decrease the amount of time that you wait before being seen by our physicians.       _____________________________________________________________  Should you have questions after your visit to [facility], please contact our office at [phone] and follow the prompts.  Our office hours are 8:00 a.m. and 4:30 p.m. Monday - Friday.  Please note that voicemails left after 4:00 p.m. may not be returned until the following business day.  We are closed weekends and major holidays.  You do have access to a nurse 24-7, just call the main number to the clinic [phone] and do not press any options, hold on the line and a nurse will answer the phone.    For prescription refill requests, have your pharmacy contact our office and allow 72 hours.    Due to Covid, you will need to wear a mask upon entering the hospital. If you do not have a mask, a mask will be given to you at the Main Entrance upon arrival. For doctor visits, patients may have 1 support person age 55 or older with them. For treatment visits, patients can not have anyone with them due to social distancing guidelines and our immunocompromised population.

## 2021-12-24 ENCOUNTER — Other Ambulatory Visit (HOSPITAL_COMMUNITY): Payer: Self-pay | Admitting: Physician Assistant

## 2021-12-25 ENCOUNTER — Ambulatory Visit (HOSPITAL_COMMUNITY): Payer: Medicare Other

## 2021-12-26 ENCOUNTER — Ambulatory Visit (HOSPITAL_COMMUNITY): Payer: Medicare Other

## 2022-01-01 ENCOUNTER — Encounter (HOSPITAL_COMMUNITY): Payer: Self-pay

## 2022-01-01 ENCOUNTER — Inpatient Hospital Stay (HOSPITAL_COMMUNITY): Payer: Medicare Other

## 2022-01-01 ENCOUNTER — Other Ambulatory Visit: Payer: Self-pay

## 2022-01-01 VITALS — BP 124/49 | HR 56 | Temp 97.7°F | Resp 18 | Ht 63.0 in | Wt 141.0 lb

## 2022-01-01 DIAGNOSIS — D500 Iron deficiency anemia secondary to blood loss (chronic): Secondary | ICD-10-CM

## 2022-01-01 DIAGNOSIS — D509 Iron deficiency anemia, unspecified: Secondary | ICD-10-CM | POA: Diagnosis not present

## 2022-01-01 MED ORDER — SODIUM CHLORIDE 0.9 % IV SOLN
300.0000 mg | Freq: Once | INTRAVENOUS | Status: AC
  Administered 2022-01-01: 12:00:00 300 mg via INTRAVENOUS
  Filled 2022-01-01: qty 300

## 2022-01-01 MED ORDER — ACETAMINOPHEN 325 MG PO TABS
650.0000 mg | ORAL_TABLET | Freq: Once | ORAL | Status: AC
  Administered 2022-01-01: 11:00:00 650 mg via ORAL
  Filled 2022-01-01: qty 2

## 2022-01-01 MED ORDER — SODIUM CHLORIDE 0.9 % IV SOLN: Freq: Once | INTRAVENOUS | Status: AC

## 2022-01-01 MED ORDER — LORATADINE 10 MG PO TABS
10.0000 mg | ORAL_TABLET | Freq: Once | ORAL | Status: AC
  Administered 2022-01-01: 11:00:00 10 mg via ORAL
  Filled 2022-01-01: qty 1

## 2022-01-01 NOTE — Progress Notes (Signed)
Patient tolerated iron infusion with no complaints voiced.  Peripheral IV site clean and dry with good blood return noted before and after infusion.  Band aid applied.  VSS with discharge and left in satisfactory condition with no s/s of distress noted.   

## 2022-01-01 NOTE — Patient Instructions (Signed)
[facility]  Discharge Instructions: Thank you for choosing [facility] to provide your oncology and hematology care.  If you have a lab appointment with the Cancer Center, please come in thru the Main Entrance and check in at the main information desk.  Wear comfortable clothing and clothing appropriate for easy access to any Portacath or PICC line.   We strive to give you quality time with your provider. You may need to reschedule your appointment if you arrive late (15 or more minutes).  Arriving late affects you and other patients whose appointments are after yours.  Also, if you miss three or more appointments without notifying the office, you may be dismissed from the clinic at the provider's discretion.      For prescription refill requests, have your pharmacy contact our office and allow 72 hours for refills to be completed.    Today you received the following: Venofer, return as scheduled.   To help prevent nausea and vomiting after your treatment, we encourage you to take your nausea medication as directed.  BELOW ARE SYMPTOMS THAT SHOULD BE REPORTED IMMEDIATELY: *FEVER GREATER THAN 100.4 F (38 C) OR HIGHER *CHILLS OR SWEATING *NAUSEA AND VOMITING THAT IS NOT CONTROLLED WITH YOUR NAUSEA MEDICATION *UNUSUAL SHORTNESS OF BREATH *UNUSUAL BRUISING OR BLEEDING *URINARY PROBLEMS (pain or burning when urinating, or frequent urination) *BOWEL PROBLEMS (unusual diarrhea, constipation, pain near the anus) TENDERNESS IN MOUTH AND THROAT WITH OR WITHOUT PRESENCE OF ULCERS (sore throat, sores in mouth, or a toothache) UNUSUAL RASH, SWELLING OR PAIN  UNUSUAL VAGINAL DISCHARGE OR ITCHING   Items with * indicate a potential emergency and should be followed up as soon as possible or go to the Emergency Department if any problems should occur.  Please show the CHEMOTHERAPY ALERT CARD or IMMUNOTHERAPY ALERT CARD at check-in to the Emergency Department and triage  nurse.  Should you have questions after your visit or need to cancel or reschedule your appointment, please contact [facility] [phone]  and follow the prompts.  Office hours are 8:00 a.m. to 4:30 p.m. Monday - Friday. Please note that voicemails left after 4:00 p.m. may not be returned until the following business day.  We are closed weekends and major holidays. You have access to a nurse at all times for urgent questions. Please call the main number to the clinic [phone] and follow the prompts.  For any non-urgent questions, you may also contact your provider using MyChart. We now offer e-Visits for anyone 18 and older to request care online for non-urgent symptoms. For details visit [email or web address].   Also download the MyChart app! Go to the app store, search "MyChart", open the app, select [facility], and log in with your MyChart username and password.  Due to Covid, a mask is required upon entering the hospital/clinic. If you do not have a mask, one will be given to you upon arrival. For doctor visits, patients may have 1 support person aged 18 or older with them. For treatment visits, patients cannot have anyone with them due to current Covid guidelines and our immunocompromised population.  

## 2022-01-02 ENCOUNTER — Ambulatory Visit (HOSPITAL_COMMUNITY): Payer: Medicare Other

## 2022-01-09 ENCOUNTER — Ambulatory Visit: Payer: Medicare Other | Admitting: Cardiology

## 2022-01-09 DIAGNOSIS — D126 Benign neoplasm of colon, unspecified: Secondary | ICD-10-CM | POA: Insufficient documentation

## 2022-01-10 ENCOUNTER — Inpatient Hospital Stay (HOSPITAL_COMMUNITY): Payer: Medicare Other | Attending: Hematology

## 2022-01-10 ENCOUNTER — Other Ambulatory Visit: Payer: Self-pay

## 2022-01-10 VITALS — BP 110/57 | HR 83 | Temp 97.7°F | Resp 18

## 2022-01-10 DIAGNOSIS — E538 Deficiency of other specified B group vitamins: Secondary | ICD-10-CM | POA: Insufficient documentation

## 2022-01-10 DIAGNOSIS — D509 Iron deficiency anemia, unspecified: Secondary | ICD-10-CM | POA: Insufficient documentation

## 2022-01-10 DIAGNOSIS — D696 Thrombocytopenia, unspecified: Secondary | ICD-10-CM | POA: Insufficient documentation

## 2022-01-10 DIAGNOSIS — D500 Iron deficiency anemia secondary to blood loss (chronic): Secondary | ICD-10-CM

## 2022-01-10 MED ORDER — ACETAMINOPHEN 325 MG PO TABS
650.0000 mg | ORAL_TABLET | Freq: Once | ORAL | Status: AC
  Administered 2022-01-10: 650 mg via ORAL
  Filled 2022-01-10: qty 2

## 2022-01-10 MED ORDER — SODIUM CHLORIDE 0.9 % IV SOLN: Freq: Once | INTRAVENOUS | Status: AC

## 2022-01-10 MED ORDER — LORATADINE 10 MG PO TABS
10.0000 mg | ORAL_TABLET | Freq: Once | ORAL | Status: AC
  Administered 2022-01-10: 10 mg via ORAL
  Filled 2022-01-10: qty 1

## 2022-01-10 MED ORDER — SODIUM CHLORIDE 0.9 % IV SOLN
300.0000 mg | Freq: Once | INTRAVENOUS | Status: AC
  Administered 2022-01-10: 300 mg via INTRAVENOUS
  Filled 2022-01-10: qty 300

## 2022-01-10 NOTE — Patient Instructions (Signed)
[facility]  Discharge Instructions: Thank you for choosing [facility] to provide your oncology and hematology care.  If you have a lab appointment with the Cancer Center, please come in thru the Main Entrance and check in at the main information desk.  Wear comfortable clothing and clothing appropriate for easy access to any Portacath or PICC line.   We strive to give you quality time with your provider. You may need to reschedule your appointment if you arrive late (15 or more minutes).  Arriving late affects you and other patients whose appointments are after yours.  Also, if you miss three or more appointments without notifying the office, you may be dismissed from the clinic at the provider's discretion.      For prescription refill requests, have your pharmacy contact our office and allow 72 hours for refills to be completed.    Today you received the following chemotherapy and/or immunotherapy agents Venofer IV iron infusion.    BELOW ARE SYMPTOMS THAT SHOULD BE REPORTED IMMEDIATELY: *FEVER GREATER THAN 100.4 F (38 C) OR HIGHER *CHILLS OR SWEATING *NAUSEA AND VOMITING THAT IS NOT CONTROLLED WITH YOUR NAUSEA MEDICATION *UNUSUAL SHORTNESS OF BREATH *UNUSUAL BRUISING OR BLEEDING *URINARY PROBLEMS (pain or burning when urinating, or frequent urination) *BOWEL PROBLEMS (unusual diarrhea, constipation, pain near the anus) TENDERNESS IN MOUTH AND THROAT WITH OR WITHOUT PRESENCE OF ULCERS (sore throat, sores in mouth, or a toothache) UNUSUAL RASH, SWELLING OR PAIN  UNUSUAL VAGINAL DISCHARGE OR ITCHING   Items with * indicate a potential emergency and should be followed up as soon as possible or go to the Emergency Department if any problems should occur.  Please show the CHEMOTHERAPY ALERT CARD or IMMUNOTHERAPY ALERT CARD at check-in to the Emergency Department and triage nurse.  Should you have questions after your visit or need to cancel or reschedule your  appointment, please contact [facility] [phone]  and follow the prompts.  Office hours are 8:00 a.m. to 4:30 p.m. Monday - Friday. Please note that voicemails left after 4:00 p.m. may not be returned until the following business day.  We are closed weekends and major holidays. You have access to a nurse at all times for urgent questions. Please call the main number to the clinic [phone] and follow the prompts.  For any non-urgent questions, you may also contact your provider using MyChart. We now offer e-Visits for anyone 18 and older to request care online for non-urgent symptoms. For details visit [email or web address].   Also download the MyChart app! Go to the app store, search "MyChart", open the app, select [facility], and log in with your MyChart username and password.  Due to Covid, a mask is required upon entering the hospital/clinic. If you do not have a mask, one will be given to you upon arrival. For doctor visits, patients may have 1 support person aged 18 or older with them. For treatment visits, patients cannot have anyone with them due to current Covid guidelines and our immunocompromised population.  

## 2022-01-10 NOTE — Progress Notes (Signed)
Pt presents today for Venofer IV iron infusion per provider's order. Vital signs stable and pt voiced no new complaints at this time.  Peripheral IV started with good blood return pre and post infusion.  Venofer 300 mg given today per MD orders. Tolerated infusion without adverse affects. Vital signs stable. No complaints at this time. Discharged from clinic ambulatory with walker in stable condition. Alert and oriented x 3. F/U with [facility] as scheduled.

## 2022-01-15 ENCOUNTER — Telehealth: Payer: Self-pay | Admitting: Cardiology

## 2022-01-15 NOTE — Telephone Encounter (Signed)
° °  Pre-operative Risk Assessment    Patient Name: Tasha Roberts  DOB: [birth year] MRN: [id number]      Request for Surgical Clearance    Procedure:   UPPER ENDOSCOPY AND COLONOSCOPY  Date of Surgery:  Clearance 01/28/22                                 Surgeon:  DR. CARL BERG Surgeon's Group or Practice Name:  [facility] Phone number:  [phone] Fax number:  [phone]   Type of Clearance Requested:   - Medical  - Pharmacy:  Hold Apixaban (Eliquis)     Type of Anesthesia:  MAC   Additional requests/questions:  Please advise surgeon/provider what medications should be held. Please fax a copy of CLEARANCE to the surgeon's office.  Signed, Desma Paganini   01/15/2022, 10:55 AM

## 2022-01-15 NOTE — Telephone Encounter (Signed)
Patient with diagnosis of afib on Eliquis for anticoagulation.    Procedure: upper endoscopy and colonoscopy Date of procedure: 01/28/22  CHA2DS2-VASc Score = 6  This indicates a 9.7% annual risk of stroke. The patient's score is based upon: CHF History: 0 HTN History: 1 Diabetes History: 1 Stroke History: 0 Vascular Disease History: 1 Age Score: 2 Gender Score: 1   CrCl 68mL/min Platelet count 155K  Per office protocol, patient can hold Eliquis for 1-2 days prior to procedure.

## 2022-01-15 NOTE — Telephone Encounter (Signed)
Will route to pharm for input on anticoag then pt will need call. Last OV 04/2021.

## 2022-01-16 NOTE — Telephone Encounter (Signed)
Left message for the patient to call back and speak to the on-call preop APP of the day 

## 2022-01-17 ENCOUNTER — Other Ambulatory Visit: Payer: Self-pay

## 2022-01-17 ENCOUNTER — Inpatient Hospital Stay (HOSPITAL_COMMUNITY): Payer: Medicare Other

## 2022-01-17 ENCOUNTER — Encounter (HOSPITAL_COMMUNITY): Payer: Self-pay

## 2022-01-17 VITALS — BP 117/43 | HR 46 | Temp 97.4°F | Resp 18

## 2022-01-17 DIAGNOSIS — D509 Iron deficiency anemia, unspecified: Secondary | ICD-10-CM | POA: Diagnosis not present

## 2022-01-17 DIAGNOSIS — D500 Iron deficiency anemia secondary to blood loss (chronic): Secondary | ICD-10-CM

## 2022-01-17 MED ORDER — SODIUM CHLORIDE 0.9 % IV SOLN: Freq: Once | INTRAVENOUS | Status: AC

## 2022-01-17 MED ORDER — LORATADINE 10 MG PO TABS
10.0000 mg | ORAL_TABLET | Freq: Once | ORAL | Status: AC
  Administered 2022-01-17: 10 mg via ORAL
  Filled 2022-01-17: qty 1

## 2022-01-17 MED ORDER — SODIUM CHLORIDE 0.9 % IV SOLN
300.0000 mg | Freq: Once | INTRAVENOUS | Status: AC
  Administered 2022-01-17: 300 mg via INTRAVENOUS
  Filled 2022-01-17: qty 300

## 2022-01-17 MED ORDER — ACETAMINOPHEN 325 MG PO TABS
650.0000 mg | ORAL_TABLET | Freq: Once | ORAL | Status: AC
  Administered 2022-01-17: 650 mg via ORAL
  Filled 2022-01-17: qty 2

## 2022-01-17 NOTE — Progress Notes (Signed)
Chaplain engaged in an initial visit with Jan.  Jan shared about her healthcare journey which has included being anemic and dealing with pain in her knees.  She has found it hard recently to get up the stairs into her home.  She is preparing soon to have a Cortizone shot for some relief.  Jan also shared about losing her husband a year ago.  She voiced that this year has been really hard for her.  Jan's husband had dementia and she detailed the events around his death.  She has undergone a number of transitions.  She moved out of her home she used to share with her husband and moved in with one of her children.  Her son has had his own health challenges so she is also helping him get back on his feet during this time. Chaplain affirmed the many things she has experienced and had to go through within the last year.    Chaplain offered listening, presence and support as she shared memories, her emotions and feelings.  Chaplain also offered a blessing of peace, comfort and replenishment for her.     01/17/22 1000  Clinical Encounter Type  Visited With Patient  Visit Type Initial;Spiritual support  Stress Factors  Patient Stress Factors Health changes;Major life changes

## 2022-01-17 NOTE — Patient Instructions (Signed)
[facility]  Discharge Instructions: Thank you for choosing [facility] to provide your oncology and hematology care.  If you have a lab appointment with the Cancer Center, please come in thru the Main Entrance and check in at the main information desk.  Wear comfortable clothing and clothing appropriate for easy access to any Portacath or PICC line.   We strive to give you quality time with your provider. You may need to reschedule your appointment if you arrive late (15 or more minutes).  Arriving late affects you and other patients whose appointments are after yours.  Also, if you miss three or more appointments without notifying the office, you may be dismissed from the clinic at the provider's discretion.      For prescription refill requests, have your pharmacy contact our office and allow 72 hours for refills to be completed.        To help prevent nausea and vomiting after your treatment, we encourage you to take your nausea medication as directed.  BELOW ARE SYMPTOMS THAT SHOULD BE REPORTED IMMEDIATELY: *FEVER GREATER THAN 100.4 F (38 C) OR HIGHER *CHILLS OR SWEATING *NAUSEA AND VOMITING THAT IS NOT CONTROLLED WITH YOUR NAUSEA MEDICATION *UNUSUAL SHORTNESS OF BREATH *UNUSUAL BRUISING OR BLEEDING *URINARY PROBLEMS (pain or burning when urinating, or frequent urination) *BOWEL PROBLEMS (unusual diarrhea, constipation, pain near the anus) TENDERNESS IN MOUTH AND THROAT WITH OR WITHOUT PRESENCE OF ULCERS (sore throat, sores in mouth, or a toothache) UNUSUAL RASH, SWELLING OR PAIN  UNUSUAL VAGINAL DISCHARGE OR ITCHING   Items with * indicate a potential emergency and should be followed up as soon as possible or go to the Emergency Department if any problems should occur.  Please show the CHEMOTHERAPY ALERT CARD or IMMUNOTHERAPY ALERT CARD at check-in to the Emergency Department and triage nurse.  Should you have questions after your visit or need to cancel  or reschedule your appointment, please contact [facility] [phone]  and follow the prompts.  Office hours are 8:00 a.m. to 4:30 p.m. Monday - Friday. Please note that voicemails left after 4:00 p.m. may not be returned until the following business day.  We are closed weekends and major holidays. You have access to a nurse at all times for urgent questions. Please call the main number to the clinic [phone] and follow the prompts.  For any non-urgent questions, you may also contact your provider using MyChart. We now offer e-Visits for anyone 18 and older to request care online for non-urgent symptoms. For details visit [email or web address].   Also download the MyChart app! Go to the app store, search "MyChart", open the app, select [facility], and log in with your MyChart username and password.  Due to Covid, a mask is required upon entering the hospital/clinic. If you do not have a mask, one will be given to you upon arrival. For doctor visits, patients may have 1 support person aged 18 or older with them. For treatment visits, patients cannot have anyone with them due to current Covid guidelines and our immunocompromised population.  

## 2022-01-17 NOTE — Progress Notes (Signed)
Patient tolerated iron infusion with no complaints voiced.  Peripheral IV site clean and dry with good blood return noted before and after infusion.  Band aid applied.  VSS with discharge and left in satisfactory condition with no s/s of distress noted.   

## 2022-01-22 NOTE — Telephone Encounter (Signed)
° °  Patient Name: Tasha Roberts  DOB: 1940/12/16 MRN: [id number]  Primary Cardiologist: Carlyle Dolly, MD  Chart reviewed as part of pre-operative protocol coverage. I tried call patient back again since she did not return our call. She answered and affirms she has been doing well without any new cardiac symptoms. Therefore, based on ACC/AHA guidelines, the patient would be at acceptable risk for the planned procedure without further cardiovascular testing. The patient was advised that if she develops new symptoms prior to surgery to contact our office to arrange for a follow-up visit, and she verbalized understanding.  Per pharmD, Per office protocol, patient can hold Eliquis for 1-2 days prior to procedure.  Will defer to GI team to notify patient of their final instructions/recommendations.  Will route this bundled recommendation to requesting provider via Epic fax function. Please call with questions.  Charlie Pitter, PA-C 01/22/2022, 4:05 PM

## 2022-03-05 ENCOUNTER — Other Ambulatory Visit: Payer: Self-pay | Admitting: Internal Medicine

## 2022-03-05 DIAGNOSIS — K7460 Unspecified cirrhosis of liver: Secondary | ICD-10-CM

## 2022-03-17 ENCOUNTER — Inpatient Hospital Stay (HOSPITAL_COMMUNITY): Payer: Medicare Other | Attending: Hematology

## 2022-03-17 DIAGNOSIS — I480 Paroxysmal atrial fibrillation: Secondary | ICD-10-CM | POA: Insufficient documentation

## 2022-03-17 DIAGNOSIS — Z822 Family history of deafness and hearing loss: Secondary | ICD-10-CM | POA: Insufficient documentation

## 2022-03-17 DIAGNOSIS — K219 Gastro-esophageal reflux disease without esophagitis: Secondary | ICD-10-CM | POA: Insufficient documentation

## 2022-03-17 DIAGNOSIS — K922 Gastrointestinal hemorrhage, unspecified: Secondary | ICD-10-CM | POA: Diagnosis not present

## 2022-03-17 DIAGNOSIS — I10 Essential (primary) hypertension: Secondary | ICD-10-CM | POA: Diagnosis not present

## 2022-03-17 DIAGNOSIS — E538 Deficiency of other specified B group vitamins: Secondary | ICD-10-CM | POA: Insufficient documentation

## 2022-03-17 DIAGNOSIS — M2550 Pain in unspecified joint: Secondary | ICD-10-CM | POA: Diagnosis not present

## 2022-03-17 DIAGNOSIS — Z853 Personal history of malignant neoplasm of breast: Secondary | ICD-10-CM | POA: Insufficient documentation

## 2022-03-17 DIAGNOSIS — Z90721 Acquired absence of ovaries, unilateral: Secondary | ICD-10-CM | POA: Insufficient documentation

## 2022-03-17 DIAGNOSIS — D696 Thrombocytopenia, unspecified: Secondary | ICD-10-CM | POA: Diagnosis not present

## 2022-03-17 DIAGNOSIS — K7460 Unspecified cirrhosis of liver: Secondary | ICD-10-CM | POA: Diagnosis not present

## 2022-03-17 DIAGNOSIS — Z823 Family history of stroke: Secondary | ICD-10-CM | POA: Insufficient documentation

## 2022-03-17 DIAGNOSIS — R5383 Other fatigue: Secondary | ICD-10-CM | POA: Insufficient documentation

## 2022-03-17 DIAGNOSIS — D509 Iron deficiency anemia, unspecified: Secondary | ICD-10-CM | POA: Diagnosis present

## 2022-03-17 DIAGNOSIS — K7581 Nonalcoholic steatohepatitis (NASH): Secondary | ICD-10-CM | POA: Diagnosis not present

## 2022-03-17 DIAGNOSIS — Z821 Family history of blindness and visual loss: Secondary | ICD-10-CM | POA: Insufficient documentation

## 2022-03-17 DIAGNOSIS — K5900 Constipation, unspecified: Secondary | ICD-10-CM | POA: Diagnosis not present

## 2022-03-17 DIAGNOSIS — M549 Dorsalgia, unspecified: Secondary | ICD-10-CM | POA: Diagnosis not present

## 2022-03-17 DIAGNOSIS — F32A Depression, unspecified: Secondary | ICD-10-CM | POA: Diagnosis not present

## 2022-03-17 DIAGNOSIS — Z79899 Other long term (current) drug therapy: Secondary | ICD-10-CM | POA: Insufficient documentation

## 2022-03-17 DIAGNOSIS — R161 Splenomegaly, not elsewhere classified: Secondary | ICD-10-CM | POA: Diagnosis not present

## 2022-03-17 DIAGNOSIS — G479 Sleep disorder, unspecified: Secondary | ICD-10-CM | POA: Diagnosis not present

## 2022-03-17 DIAGNOSIS — Z7901 Long term (current) use of anticoagulants: Secondary | ICD-10-CM | POA: Insufficient documentation

## 2022-03-17 DIAGNOSIS — Z9049 Acquired absence of other specified parts of digestive tract: Secondary | ICD-10-CM | POA: Insufficient documentation

## 2022-03-17 DIAGNOSIS — R001 Bradycardia, unspecified: Secondary | ICD-10-CM | POA: Insufficient documentation

## 2022-03-17 DIAGNOSIS — D500 Iron deficiency anemia secondary to blood loss (chronic): Secondary | ICD-10-CM

## 2022-03-17 DIAGNOSIS — R42 Dizziness and giddiness: Secondary | ICD-10-CM | POA: Insufficient documentation

## 2022-03-17 DIAGNOSIS — E039 Hypothyroidism, unspecified: Secondary | ICD-10-CM | POA: Insufficient documentation

## 2022-03-17 DIAGNOSIS — I2510 Atherosclerotic heart disease of native coronary artery without angina pectoris: Secondary | ICD-10-CM | POA: Insufficient documentation

## 2022-03-17 DIAGNOSIS — Z818 Family history of other mental and behavioral disorders: Secondary | ICD-10-CM | POA: Insufficient documentation

## 2022-03-17 DIAGNOSIS — R32 Unspecified urinary incontinence: Secondary | ICD-10-CM | POA: Diagnosis not present

## 2022-03-17 DIAGNOSIS — G2581 Restless legs syndrome: Secondary | ICD-10-CM | POA: Diagnosis not present

## 2022-03-17 DIAGNOSIS — E119 Type 2 diabetes mellitus without complications: Secondary | ICD-10-CM | POA: Insufficient documentation

## 2022-03-17 DIAGNOSIS — E785 Hyperlipidemia, unspecified: Secondary | ICD-10-CM | POA: Insufficient documentation

## 2022-03-17 DIAGNOSIS — Z8249 Family history of ischemic heart disease and other diseases of the circulatory system: Secondary | ICD-10-CM | POA: Insufficient documentation

## 2022-03-17 LAB — CBC WITH DIFFERENTIAL/PLATELET
Abs Immature Granulocytes: 0.01 10*3/uL (ref 0.00–0.07)
Basophils Absolute: 0 10*3/uL (ref 0.0–0.1)
Basophils Relative: 1 %
Eosinophils Absolute: 0.1 10*3/uL (ref 0.0–0.5)
Eosinophils Relative: 1 %
HCT: 38.5 % (ref 36.0–46.0)
Hemoglobin: 12.8 g/dL (ref 12.0–15.0)
Immature Granulocytes: 0 %
Lymphocytes Relative: 27 %
Lymphs Abs: 1.4 10*3/uL (ref 0.7–4.0)
MCH: 30.1 pg (ref 26.0–34.0)
MCHC: 33.2 g/dL (ref 30.0–36.0)
MCV: 90.6 fL (ref 80.0–100.0)
Monocytes Absolute: 0.4 10*3/uL (ref 0.1–1.0)
Monocytes Relative: 8 %
Neutro Abs: 3.4 10*3/uL (ref 1.7–7.7)
Neutrophils Relative %: 63 %
Platelets: 123 10*3/uL — ABNORMAL LOW (ref 150–400)
RBC: 4.25 MIL/uL (ref 3.87–5.11)
RDW: 13.3 % (ref 11.5–15.5)
WBC: 5.3 10*3/uL (ref 4.0–10.5)
nRBC: 0 % (ref 0.0–0.2)

## 2022-03-17 LAB — FERRITIN: Ferritin: 248 ng/mL (ref 11–307)

## 2022-03-17 LAB — IRON AND TIBC
Iron: 100 ug/dL (ref 28–170)
Saturation Ratios: 32 % — ABNORMAL HIGH (ref 10.4–31.8)
TIBC: 316 ug/dL (ref 250–450)
UIBC: 216 ug/dL

## 2022-03-17 LAB — VITAMIN B12: Vitamin B-12: 2373 pg/mL — ABNORMAL HIGH (ref 180–914)

## 2022-03-19 LAB — METHYLMALONIC ACID, SERUM: Methylmalonic Acid, Quantitative: 178 nmol/L (ref 0–378)

## 2022-03-23 NOTE — Progress Notes (Signed)
? ?[facility] ?[address] ?[place], [place] [address] ? ? ?CLINIC:  ?Medical Oncology/Hematology ? ?PCP:  ?Lindell Spar, MD ?[address] ?[place] [address] ?[phone] ? ? ?REASON FOR VISIT:  ?Follow-up for iron deficiency anemia and thrombocytopenia in the setting of cirrhosis ?  ?CURRENT THERAPY: Intermittent IV iron infusions, last given 01/17/2022 ?  ?INTERVAL HISTORY:  ?Tasha Roberts 81 y.o. 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  She was last seen by Tarri Abernethy PA-C on 12/23/2021. ? ?At today's visit, she reports feeling fatigued and worn down.  In addition to her chronic health issues, she has also had some increased family stressors related to a death in the family and health issues of various family members.  Regarding her own health, she has not had any recent hospitalizations, surgeries, or changes in baseline health status. ? ?She continues to take Eliquis for atrial fibrillation.  She denies any epistaxis, hematemesis, hematochezia, or other major bleeding events.  She does have significant bruising and reports that she will bruise with the slightest provocation.  She denies any petechial rash.  She continues to have significant fatigue, which was unimproved after most recent IV iron.  She has occasional restless legs at night and intermittent episodes of lightheadedness.  She denies any pica, headaches, chest pain, dyspnea on exertion, or syncope.  She denies any B symptoms such as fever, chills, night sweats, unintentional weight loss. ? ?She has 40% energy and very little appetite. She endorses that she is maintaining a stable weight. ? ? ?REVIEW OF SYSTEMS:  ?Review of Systems  ?Constitutional:  Positive for appetite change and fatigue. Negative for chills, diaphoresis, fever and unexpected weight change.  ?HENT:   Negative for  lump/mass and nosebleeds.   ?Eyes:  Negative for eye problems.  ?Respiratory:  Negative for cough, hemoptysis and shortness of breath.   ?Cardiovascular:  Negative for chest pain, leg swelling and palpitations.  ?Gastrointestinal:  Positive for constipation. Negative for abdominal pain, blood in stool, diarrhea, nausea and vomiting.  ?Genitourinary:  Positive for bladder incontinence. Negative for hematuria.   ?Musculoskeletal:  Positive for arthralgias and back pain.  ?Skin: Negative.   ?Neurological:  Negative for dizziness, headaches and light-headedness.  ?Hematological:  Does not bruise/bleed easily.  ?Psychiatric/Behavioral:  Positive for depression and sleep disturbance. The patient is nervous/anxious.    ? ? ?PAST MEDICAL/SURGICAL HISTORY:  ?Past Medical History:  ?Diagnosis Date  ? Anemia due to blood loss   ? iron infusion's last one 03-25-2021 (pt bleeding post vulvar bx 02-11-2021, hospiatal admission 02-21-2021)  ? Anticoagulant long-term use   ? eliquis--- managed by pcp   ? Arthritis   ? hands  ? Bronchiectasis ([facility])   ? pulmonology --- dr Halford Chessman   (04-01-2021  pt stated no supplemental oxygen use since 01/ 2022 and no inhaler use,  stated checks O2 at home during the day 98-99%)  ? Cancer [facility]) 2013  ? Lumpectomy left breast  ? Cataract 2015  ? Chronic constipation   ? Clotting disorder ([facility])   ? On eliquis  ? Coronary artery calcification   ? per lexiscan 08-17-2019 result in care everywhere  ? Depression, major, single episode, moderate ([facility]) 12/05/2021  ? DM II (diabetes mellitus, type II), controlled ([facility]) 08/27/2005  ? DOE (dyspnea on exertion)   ? 04-01-2021  per pt only going up  stairs   ? Gastroesophageal reflux disease 12/05/2021  ? History of diabetes mellitus, type II   ? 04-01-2021  per pt no issues after weight loss, last taken medication approx 2017  ? History of Graves' disease 2017  ? s/p RAI  ? History of left breast cancer   ? dx 2013,  s/p left partial mastecotmy w/ node dissection  09-15-2012, low grade DCIS, no chemo, completed radiation 02/ 2014,  per pt no recurrence  ? History of respiratory syncytial virus (RSV) infection 09/2020  ? w/ hypoxia  , hospital admission 10/ 2021  and acute exacerbation bronchiectasis;  follow up post hospital pulmonogy dr sood  note in epic  ? Hyperlipidemia   ? Hypertension   ? followed by pcp   (pt had lexiscan in care everywhere dated 08-17-2019 no ischemia , non-obstructive extensive calcification, ef 69%)  ? Hypothyroidism   ? Hypothyroidism, postradioiodine therapy   ? followed by pcp  ? Liver cirrhosis secondary to NASH (nonalcoholic steatohepatitis) ([facility])   ? followed by dr Merrilee Jansky ([facility])---- dx 2004,  compensated ,  last liver bx (03/ 2015) fibrosis stage 3;  moderate portal hypertensive gastrophy  ? PAF (paroxysmal atrial fibrillation) [facility]) previous cardiologist lov note w/ Suann Larry PA ([facility] in [place]) dated 01-24-2020 in care everywhere;  (04-01-2021 per pt has appt w/ new cardiologist , dr j. branch 04-30-2021)  ? first dx 2020--  had event monitor/ stress test/ echo all results in care everywhere (monitor 08-03-2019 short runs AFib conversion pause <2 secconds, rate controlled;  echo 12-14-2019 ef 60%, mild concentric LVH, G2DD, RSVP 50.54mHg)  ? Personal history of radiation therapy 2013  ? left breast cancer,  completed 02/ 2014  ? PONV (postoperative nausea and vomiting)   ? Sarcoidosis, lung ([facility] pulmonology--- dr sHalford Chessman ? 04-01-2021  per pt dx age [age]s, no issue since  ? Thrombocytopenia ([facility]   ? hematology/ oncology--- dr s. kDelton Coombes ? VIN III (vulvar intraepithelial neoplasia III)   ? ?Past Surgical History:  ?Procedure Laterality Date  ? ABDOMINAL HYSTERECTOMY  1989  ? W/  UNILATERAL SALPINGOOPHORECTOMY  ? APPENDECTOMY  1989  ? BREAST SURGERY  09/15/2012  ? CATARACT EXTRACTION W/ INTRAOCULAR LENS  IMPLANT, BILATERAL  2016  ? CHOLECYSTECTOMY OPEN  1987  ? CO2 LASER APPLICATION N/A [id number]/[id number]  ? Procedure: CO2 LASER APPLICATION OF THE VULVA;  Surgeon: REveritt Amber MD;  [facility]  Service: Gynecology;  Laterality: N/A;  ? COLONOSCOPY WITH ESOPHAGOGASTRODUODENOSCOPY (EGD)  last one 08/ 2018  '@[facility]'$   ? EYE SURGERY  2015  ? Cataract removed both eyes  ? LUMBAR LAMINECTOMY  2009  ? PARTIAL MASTECTOMY WITH AXILLARY SENTINEL LYMPH NODE BIOPSY [facility]15[facility] ? TUBAL LIGATION  08/08/1969  ? ? ? ?SOCIAL HISTORY:  ?Social History  ? ?Socioeconomic History  ? Marital status: Widowed  ?  Spouse name: Not on file  ? Number of children: 3  ? Years of education: Not on file  ? Highest education level: Not on file  ?Occupational History  ? Occupation: retired  ?Tobacco Use  ? Smoking status: Never  ? Smokeless tobacco: Never  ?Vaping Use  ? Vaping Use: Never used  ?Substance and Sexual Activity  ? Alcohol use: Not Currently  ? Drug use: Never  ? Sexual activity: Not Currently  ?  Birth control/protection: Post-menopausal  ?Other Topics Concern  ? Not on file  ?  Social History Narrative  ? Not on file  ? ?Social Determinants of Health  ? ?Financial Resource Strain: Low Risk   ? Difficulty of Paying Living Expenses: Not hard at all  ?Food Insecurity: No Food Insecurity  ? Worried About Charity fundraiser in the Last Year: Never true  ? Ran Out of Food in the Last Year: Never true  ?Transportation Needs: No Transportation Needs  ? Lack of Transportation (Medical): No  ? Lack of Transportation (Non-Medical): No  ?Physical Activity: Insufficiently Active  ? Days of Exercise per Week: 3 days  ? Minutes of Exercise per Session: 30 min  ?Stress: No Stress Concern Present  ? Feeling of Stress : Not at all  ?Social Connections: Moderately Isolated  ? Frequency of Communication with Friends and Family: More than three times a week  ? Frequency of Social Gatherings with Friends and Family: More than three times a week  ? Attends Religious Services: More than 4 times per year  ? Active Member  of Clubs or Organizations: No  ? Attends Archivist Meetings: Never  ? Marital Status: Widowed  ?Intimate Partner Violence: Not At Risk  ? Fear of Current or Ex-Partner: No  ? Emotionally Abused: No

## 2022-03-24 ENCOUNTER — Inpatient Hospital Stay (HOSPITAL_BASED_OUTPATIENT_CLINIC_OR_DEPARTMENT_OTHER): Payer: Medicare Other | Admitting: Physician Assistant

## 2022-03-24 VITALS — BP 113/69 | HR 54 | Temp 98.0°F | Resp 17 | Ht 63.0 in | Wt 133.7 lb

## 2022-03-24 DIAGNOSIS — Z1231 Encounter for screening mammogram for malignant neoplasm of breast: Secondary | ICD-10-CM | POA: Diagnosis not present

## 2022-03-24 DIAGNOSIS — D696 Thrombocytopenia, unspecified: Secondary | ICD-10-CM

## 2022-03-24 DIAGNOSIS — Z853 Personal history of malignant neoplasm of breast: Secondary | ICD-10-CM | POA: Diagnosis not present

## 2022-03-24 DIAGNOSIS — D500 Iron deficiency anemia secondary to blood loss (chronic): Secondary | ICD-10-CM | POA: Diagnosis not present

## 2022-03-24 DIAGNOSIS — A419 Sepsis, unspecified organism: Secondary | ICD-10-CM | POA: Diagnosis not present

## 2022-03-24 DIAGNOSIS — E538 Deficiency of other specified B group vitamins: Secondary | ICD-10-CM

## 2022-03-24 NOTE — Patient Instructions (Signed)
Tasha Roberts at [facility] ?Discharge Instructions ? ?You were seen today by Tarri Abernethy PA-C for your low platelets (thrombocytopenia) and low blood count (iron deficiency anemia).   ? ?LOW PLATELETS: Your low platelets are related to your liver disease.  They are currently stable. ? ?IRON DEFICIENCY ANEMIA: Your iron levels look good after the IV iron you received in February.  You can start taking over-the-counter iron (iron 65 mg/ferrous sulfate 325 mg) to see if this will keep your iron stable for a longer period of time.  If this causes any significant stomach pain or constipation, you can stop taking it. ? ?B12 DEFICIENCY: Your B12 levels have improved, and are in fact now slightly higher.  You can decrease your B12 supplement to 500 mcg daily. ? ?HISTORY OF BREAST CANCER: We will schedule you for mammogram.  We will perform breast exam at your next visit. ? ? ?FOLLOW-UP APPOINTMENT: Return for follow-up with Tarri Abernethy, PA-C in 3 months ? ? ?Thank you for choosing [facility] to provide your oncology and hematology care.  To afford each patient quality time with our provider, please arrive at least 15 minutes before your scheduled appointment time.  ? ?If you have a lab appointment with the [facility] please come in thru the Main Entrance and check in at the main information desk. ? ?You need to re-schedule your appointment should you arrive 10 or more minutes late.  We strive to give you quality time with our providers, and arriving late affects you and other patients whose appointments are after yours.  Also, if you no show three or more times for appointments you may be dismissed from the clinic at the providers discretion.     ?Again, thank you for choosing [facility].  Our hope is that these requests will decrease the amount of time that you wait before being seen by our physicians.        ?_____________________________________________________________ ? ?Should you have questions after your visit to [facility], please contact our office at [phone] and follow the prompts.  Our office hours are 8:00 a.m. and 4:30 p.m. Monday - Friday.  Please note that voicemails left after 4:00 p.m. may not be returned until the following business day.  We are closed weekends and major holidays.  You do have access to a nurse 24-7, just call the main number to the clinic [phone] and do not press any options, hold on the line and a nurse will answer the phone.   ? ?For prescription refill requests, have your pharmacy contact our office and allow 72 hours.   ? ?Due to Covid, you will need to wear a mask upon entering the hospital. If you do not have a mask, a mask will be given to you at the Main Entrance upon arrival. For doctor visits, patients may have 1 support person age 82 or older with them. For treatment visits, patients can not have anyone with them due to social distancing guidelines and our immunocompromised population.  ? ? ? ? ?

## 2022-03-27 ENCOUNTER — Inpatient Hospital Stay (HOSPITAL_COMMUNITY)
Admission: EM | Admit: 2022-03-27 | Discharge: 2022-03-30 | DRG: 872 | Disposition: A | Discharge status: Home / Self Care | Payer: Medicare Other | Attending: Internal Medicine | Admitting: Internal Medicine

## 2022-03-27 ENCOUNTER — Other Ambulatory Visit: Payer: Self-pay

## 2022-03-27 ENCOUNTER — Emergency Department (HOSPITAL_COMMUNITY): Payer: Medicare Other

## 2022-03-27 ENCOUNTER — Encounter (HOSPITAL_COMMUNITY): Payer: Self-pay | Admitting: Emergency Medicine

## 2022-03-27 DIAGNOSIS — K766 Portal hypertension: Secondary | ICD-10-CM | POA: Diagnosis present

## 2022-03-27 DIAGNOSIS — N202 Calculus of kidney with calculus of ureter: Secondary | ICD-10-CM | POA: Diagnosis present

## 2022-03-27 DIAGNOSIS — Z961 Presence of intraocular lens: Secondary | ICD-10-CM | POA: Diagnosis present

## 2022-03-27 DIAGNOSIS — N390 Urinary tract infection, site not specified: Secondary | ICD-10-CM | POA: Diagnosis present

## 2022-03-27 DIAGNOSIS — R112 Nausea with vomiting, unspecified: Secondary | ICD-10-CM

## 2022-03-27 DIAGNOSIS — Z823 Family history of stroke: Secondary | ICD-10-CM

## 2022-03-27 DIAGNOSIS — E872 Acidosis, unspecified: Secondary | ICD-10-CM | POA: Diagnosis present

## 2022-03-27 DIAGNOSIS — I2510 Atherosclerotic heart disease of native coronary artery without angina pectoris: Secondary | ICD-10-CM | POA: Diagnosis present

## 2022-03-27 DIAGNOSIS — E876 Hypokalemia: Secondary | ICD-10-CM | POA: Diagnosis present

## 2022-03-27 DIAGNOSIS — Z87412 Personal history of vulvar dysplasia: Secondary | ICD-10-CM

## 2022-03-27 DIAGNOSIS — D649 Anemia, unspecified: Secondary | ICD-10-CM | POA: Diagnosis present

## 2022-03-27 DIAGNOSIS — E890 Postprocedural hypothyroidism: Secondary | ICD-10-CM | POA: Diagnosis present

## 2022-03-27 DIAGNOSIS — D6959 Other secondary thrombocytopenia: Secondary | ICD-10-CM | POA: Diagnosis present

## 2022-03-27 DIAGNOSIS — K219 Gastro-esophageal reflux disease without esophagitis: Secondary | ICD-10-CM | POA: Diagnosis present

## 2022-03-27 DIAGNOSIS — R188 Other ascites: Secondary | ICD-10-CM | POA: Diagnosis present

## 2022-03-27 DIAGNOSIS — K7581 Nonalcoholic steatohepatitis (NASH): Secondary | ICD-10-CM | POA: Diagnosis present

## 2022-03-27 DIAGNOSIS — Z888 Allergy status to other drugs, medicaments and biological substances status: Secondary | ICD-10-CM | POA: Diagnosis not present

## 2022-03-27 DIAGNOSIS — Z90710 Acquired absence of both cervix and uterus: Secondary | ICD-10-CM

## 2022-03-27 DIAGNOSIS — I482 Chronic atrial fibrillation, unspecified: Secondary | ICD-10-CM | POA: Diagnosis present

## 2022-03-27 DIAGNOSIS — A419 Sepsis, unspecified organism: Secondary | ICD-10-CM

## 2022-03-27 DIAGNOSIS — Z853 Personal history of malignant neoplasm of breast: Secondary | ICD-10-CM

## 2022-03-27 DIAGNOSIS — N201 Calculus of ureter: Principal | ICD-10-CM

## 2022-03-27 DIAGNOSIS — D696 Thrombocytopenia, unspecified: Secondary | ICD-10-CM | POA: Diagnosis not present

## 2022-03-27 DIAGNOSIS — Z7901 Long term (current) use of anticoagulants: Secondary | ICD-10-CM | POA: Diagnosis not present

## 2022-03-27 DIAGNOSIS — Z9841 Cataract extraction status, right eye: Secondary | ICD-10-CM

## 2022-03-27 DIAGNOSIS — K7460 Unspecified cirrhosis of liver: Secondary | ICD-10-CM | POA: Diagnosis present

## 2022-03-27 DIAGNOSIS — Z79890 Hormone replacement therapy (postmenopausal): Secondary | ICD-10-CM

## 2022-03-27 DIAGNOSIS — E878 Other disorders of electrolyte and fluid balance, not elsewhere classified: Secondary | ICD-10-CM | POA: Diagnosis present

## 2022-03-27 DIAGNOSIS — G8929 Other chronic pain: Secondary | ICD-10-CM | POA: Diagnosis present

## 2022-03-27 DIAGNOSIS — Z9842 Cataract extraction status, left eye: Secondary | ICD-10-CM

## 2022-03-27 DIAGNOSIS — Z821 Family history of blindness and visual loss: Secondary | ICD-10-CM

## 2022-03-27 DIAGNOSIS — E782 Mixed hyperlipidemia: Secondary | ICD-10-CM | POA: Diagnosis present

## 2022-03-27 DIAGNOSIS — D860 Sarcoidosis of lung: Secondary | ICD-10-CM | POA: Diagnosis present

## 2022-03-27 DIAGNOSIS — E039 Hypothyroidism, unspecified: Secondary | ICD-10-CM | POA: Diagnosis not present

## 2022-03-27 DIAGNOSIS — Z79899 Other long term (current) drug therapy: Secondary | ICD-10-CM

## 2022-03-27 DIAGNOSIS — R509 Fever, unspecified: Secondary | ICD-10-CM

## 2022-03-27 DIAGNOSIS — Z8249 Family history of ischemic heart disease and other diseases of the circulatory system: Secondary | ICD-10-CM

## 2022-03-27 DIAGNOSIS — Z20822 Contact with and (suspected) exposure to covid-19: Secondary | ICD-10-CM | POA: Diagnosis present

## 2022-03-27 DIAGNOSIS — I10 Essential (primary) hypertension: Secondary | ICD-10-CM | POA: Diagnosis present

## 2022-03-27 DIAGNOSIS — Z923 Personal history of irradiation: Secondary | ICD-10-CM | POA: Diagnosis not present

## 2022-03-27 DIAGNOSIS — K5909 Other constipation: Secondary | ICD-10-CM | POA: Diagnosis present

## 2022-03-27 DIAGNOSIS — Z9049 Acquired absence of other specified parts of digestive tract: Secondary | ICD-10-CM

## 2022-03-27 DIAGNOSIS — Z9012 Acquired absence of left breast and nipple: Secondary | ICD-10-CM

## 2022-03-27 DIAGNOSIS — E119 Type 2 diabetes mellitus without complications: Secondary | ICD-10-CM | POA: Diagnosis present

## 2022-03-27 HISTORY — DX: Sepsis, unspecified organism: A41.9

## 2022-03-27 LAB — CBC WITH DIFFERENTIAL/PLATELET
Abs Immature Granulocytes: 0.03 10*3/uL (ref 0.00–0.07)
Basophils Absolute: 0 10*3/uL (ref 0.0–0.1)
Basophils Relative: 0 %
Eosinophils Absolute: 0 10*3/uL (ref 0.0–0.5)
Eosinophils Relative: 0 %
HCT: 34.9 % — ABNORMAL LOW (ref 36.0–46.0)
Hemoglobin: 11.9 g/dL — ABNORMAL LOW (ref 12.0–15.0)
Immature Granulocytes: 0 %
Lymphocytes Relative: 6 %
Lymphs Abs: 0.4 10*3/uL — ABNORMAL LOW (ref 0.7–4.0)
MCH: 30.7 pg (ref 26.0–34.0)
MCHC: 34.1 g/dL (ref 30.0–36.0)
MCV: 89.9 fL (ref 80.0–100.0)
Monocytes Absolute: 0.5 10*3/uL (ref 0.1–1.0)
Monocytes Relative: 6 %
Neutro Abs: 6.3 10*3/uL (ref 1.7–7.7)
Neutrophils Relative %: 88 %
Platelets: 84 10*3/uL — ABNORMAL LOW (ref 150–400)
RBC: 3.88 MIL/uL (ref 3.87–5.11)
RDW: 13.5 % (ref 11.5–15.5)
WBC: 7.2 10*3/uL (ref 4.0–10.5)
nRBC: 0 % (ref 0.0–0.2)

## 2022-03-27 LAB — COMPREHENSIVE METABOLIC PANEL
ALT: 16 U/L (ref 0–44)
AST: 24 U/L (ref 15–41)
Albumin: 3.8 g/dL (ref 3.5–5.0)
Alkaline Phosphatase: 67 U/L (ref 38–126)
Anion gap: 7 (ref 5–15)
BUN: 30 mg/dL — ABNORMAL HIGH (ref 8–23)
CO2: 25 mmol/L (ref 22–32)
Calcium: 9.6 mg/dL (ref 8.9–10.3)
Chloride: 107 mmol/L (ref 98–111)
Creatinine, Ser: 1.07 mg/dL — ABNORMAL HIGH (ref 0.44–1.00)
GFR, Estimated: 53 mL/min — ABNORMAL LOW (ref >60)
Glucose, Bld: 118 mg/dL — ABNORMAL HIGH (ref 70–99)
Potassium: 3.2 mmol/L — ABNORMAL LOW (ref 3.5–5.1)
Sodium: 139 mmol/L (ref 135–145)
Total Bilirubin: 0.9 mg/dL (ref 0.3–1.2)
Total Protein: 6.8 g/dL (ref 6.5–8.1)

## 2022-03-27 LAB — LACTIC ACID, PLASMA
Lactic Acid, Venous: 1.5 mmol/L (ref 0.5–1.9)
Lactic Acid, Venous: 1.4 mmol/L (ref 0.5–1.9)

## 2022-03-27 LAB — URINALYSIS, ROUTINE W REFLEX MICROSCOPIC
Bilirubin Urine: NEGATIVE
Glucose, UA: NEGATIVE mg/dL
Ketones, ur: NEGATIVE mg/dL
Nitrite: NEGATIVE
Protein, ur: NEGATIVE mg/dL
Specific Gravity, Urine: 1.013 (ref 1.005–1.030)
pH: 6 (ref 5.0–8.0)

## 2022-03-27 LAB — LIPASE, BLOOD: Lipase: 30 U/L (ref 11–51)

## 2022-03-27 LAB — RESP PANEL BY RT-PCR (FLU A&B, COVID) ARPGX2
Influenza A by PCR: NEGATIVE
Influenza B by PCR: NEGATIVE
SARS Coronavirus 2 by RT PCR: NEGATIVE

## 2022-03-27 MED ORDER — LACTATED RINGERS IV SOLN: INTRAVENOUS | Status: AC

## 2022-03-27 MED ORDER — ACETAMINOPHEN 325 MG PO TABS
650.0000 mg | ORAL_TABLET | Freq: Once | ORAL | Status: AC
Start: 2022-03-27 — End: 2022-03-27
  Administered 2022-03-27: 650 mg via ORAL
  Filled 2022-03-27: qty 2

## 2022-03-27 MED ORDER — IOHEXOL 300 MG/ML  SOLN
100.0000 mL | Freq: Once | INTRAMUSCULAR | Status: AC | PRN
  Administered 2022-03-27: 100 mL via INTRAVENOUS

## 2022-03-27 MED ORDER — SODIUM CHLORIDE 0.9 % IV SOLN: INTRAVENOUS | Status: AC

## 2022-03-27 MED ORDER — ACETAMINOPHEN 325 MG PO TABS
650.0000 mg | ORAL_TABLET | Freq: Four times a day (QID) | ORAL | Status: DC | PRN
  Administered 2022-03-28 (×2): 650 mg via ORAL
  Filled 2022-03-27 (×2): qty 2

## 2022-03-27 MED ORDER — SODIUM CHLORIDE 0.9 % IV SOLN
2.0000 g | INTRAVENOUS | Status: DC
  Administered 2022-03-27 – 2022-03-29 (×3): 2 g via INTRAVENOUS
  Filled 2022-03-27 (×3): qty 20

## 2022-03-27 MED ORDER — ONDANSETRON HCL 4 MG/2ML IJ SOLN: 4.0000 mg | Freq: Four times a day (QID) | INTRAMUSCULAR | Status: DC | PRN

## 2022-03-27 NOTE — ED Provider Notes (Signed)
?[facility] ?Provider Note ? ? ?CSN: [id number] ?Arrival date & time: 03/27/22  1418 ? ?  ? ?History ? ?Chief Complaint  ?Patient presents with  ? Abdominal Pain  ? ? ?Tasha Roberts is a 81 y.o. 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 ?She states that yesterday she felt at her baseline.  Today this morning she had 7 bowel movements.  She states that they were mostly formed.  She went and got her hair done and then vomited 6 times.  She denies any blood in her stool or in her vomit.  She denies any fevers known prior to arrival however did have chills per her report.  She denies any cough or shortness of breath.  She denies any urinary symptoms. ? ?She denies any recent trauma. ?Her abdomen hurts in her lower abdomen.  It is consistent with her chronic abdominal pain and that the pain is in the same location and same crampy in nature however the pain is more severe than usual. ? ?She is followed by [facility], she had a EGD on 4//23 for history of Karlene Lineman to evaluate for esophageal varices, none of which were found according to procedure note.  There was noted moderate portal hypertensive gastropathy in the entire stomach.  She had a colonoscopy same day showing no significant findings, entire colon appeared normal per report with hemorrhoids found on the perianal exam. ? ? ? ?HPI ? ?  ? ?Home Medications ?Prior to Admission medications   ?Medication Sig Start Date End Date Taking? Authorizing Provider  ?apixaban (ELIQUIS) 5 MG TABS tablet TAKE (1) TABLET BY MOUTH TWICE DAILY. ?Patient taking differently: Take 5 mg by mouth 2 (two) times daily. 09/19/21  Yes BranchAlphonse Guild, MD  ?atorvastatin (LIPITOR) 20 MG tablet TAKE (1) TABLET BY MOUTH ONCE  DAILY. ?Patient taking differently: Take 20 mg by mouth daily. 03/05/22  Yes Lindell Spar, MD  ?Cholecalciferol 50 MCG (2000 UT) CAPS Take 2,000 mg by mouth daily.   Yes [provider]  ?clobetasol ointment (TEMOVATE) 0.05 % Apply topically at bedtime. 01/08/22  Yes [provider]  ?diltiazem (TIAZAC) 120 MG 24 hr capsule Take 120 mg by mouth daily.   Yes [provider]  ?famotidine (PEPCID) 20 MG tablet Take 1 tablet (20 mg total) by mouth 2 (two) times daily as needed for heartburn or indigestion. 12/05/21  Yes Lindell Spar, MD  ?levothyroxine (SYNTHROID) 100 MCG tablet TAKE 1 TABLET BY MOUTH ON AN EMPTY STOMACH 30 MINUTES BEFORE BREAKFAST. ?Patient taking differently: Take 100 mcg by mouth daily before breakfast. 10/22/21  Yes Lindell Spar, MD  ?lisinopril-hydrochlorothiazide (ZESTORETIC) 20-25 MG tablet TAKE (1) TABLET BY MOUTH EACH MORNING. ?Patient taking differently: Take 1 tablet by mouth daily. 10/22/21  Yes Lindell Spar, MD  ?mupirocin ointment (BACTROBAN) 2 % Apply 1 application topically 2 (two) times daily. 09/18/21  Yes Volney American, PA-C  ?nitroGLYCERIN (NITROSTAT) 0.4 MG SL tablet Place 1 tablet (0.4 mg total) under the tongue every 5 (five) minutes as needed for chest pain. 07/31/21  Yes Lindell Spar, MD  ?ursodiol (ACTIGALL) 250 MG tablet Take 250 mg by mouth 2 (two) times daily.   Yes [provider]  ?chlorhexidine (HIBICLENS) 4 % external liquid Apply topically  daily as needed. ?Patient not taking: Reported on 03/27/2022 09/18/21   Volney American, PA-C  ?mirtazapine (REMERON) 7.5 MG tablet Take 1 tablet (7.5 mg total) by mouth at bedtime. ?Patient not taking: Reported on 03/27/2022 12/05/21   Lindell Spar, MD  ?vitamin B-12 (CYANOCOBALAMIN) 1000 MCG tablet Take 1,000 mcg by mouth daily.    [provider]  ?   ? ?Allergies    ?Dicyclomine, Glucophage [metformin], and Losartan   ? ?Review of Systems   ?Review of  Systems ?See above ?Physical Exam ?Updated Vital Signs ?BP (!) 114/58   Pulse 73   Temp 98.6 ?F (37 ?C) (Oral)   Resp 20   Ht '5\' 3"'$  (1.6 m)   Wt 60.3 kg   SpO2 96%   BMI 23.56 kg/m?  ?Physical Exam ?Vitals and nursing note reviewed.  ?Constitutional:   ?   General: She is not in acute distress. ?   Appearance: She is not ill-appearing.  ?HENT:  ?   Head: Atraumatic.  ?Eyes:  ?   Conjunctiva/sclera: Conjunctivae normal.  ?Cardiovascular:  ?   Rate and Rhythm: Normal rate.  ?Pulmonary:  ?   Effort: Pulmonary effort is normal. No respiratory distress.  ?Abdominal:  ?   General: Abdomen is flat. There is no distension.  ?   Palpations: Abdomen is soft.  ?   Tenderness: There is abdominal tenderness in the suprapubic area and left lower quadrant. There is no guarding or rebound.  ?Musculoskeletal:  ?   Cervical back: Normal range of motion and neck supple.  ?   Comments: No obvious acute injury  ?Skin: ?   General: Skin is warm.  ?   Comments: Scattered contusions across arms and legs bilaterally, baseline per patient.  ?Neurological:  ?   Mental Status: She is alert.  ?   Comments: Awake and alert, answers all questions appropriately.  Speech is not slurred.  ?Psychiatric:     ?   Mood and Affect: Mood normal.     ?   Behavior: Behavior normal.  ? ? ?ED Results / Procedures / Treatments   ?Labs ?(all labs ordered are listed, but only abnormal results are displayed) ?Labs Reviewed  ?COMPREHENSIVE METABOLIC PANEL - Abnormal; Notable for the following components:  ?    Result Value  ? Potassium 3.2 (*)   ? Glucose, Bld 118 (*)   ? BUN 30 (*)   ? Creatinine, Ser 1.07 (*)   ? GFR, Estimated 53 (*)   ? All other components within normal limits  ?CBC WITH DIFFERENTIAL/PLATELET - Abnormal; Notable for the following components:  ? Hemoglobin 11.9 (*)   ? HCT 34.9 (*)   ? Platelets 84 (*)   ? Lymphs Abs 0.4 (*)   ? All other components within normal limits  ?URINALYSIS, ROUTINE W REFLEX MICROSCOPIC - Abnormal; Notable for  the following components:  ? Hgb urine dipstick MODERATE (*)   ? Leukocytes,Ua SMALL (*)   ? Bacteria, UA RARE (*)   ? All other components within normal limits  ?CULTURE, BLOOD (ROUTINE X 2)  ?CULTURE, BLOOD (ROUTINE X 2)  ?RESP PANEL BY RT-PCR (FLU A&B, COVID) ARPGX2  ?URINE CULTURE  ?LACTIC ACID, PLASMA  ?LACTIC ACID, PLASMA  ?LIPASE, BLOOD  ? ? ?EKG ?None ? ?Radiology ?DG Chest 2 View ? ?Result Date: 03/27/2022 ?CLINICAL DATA:  Fever. EXAM: CHEST - 2 VIEW COMPARISON:  Chest x-ray 10/02/2020. FINDINGS: There is no focal lung consolidation, pleural effusion or pneumothorax  identified. The cardiomediastinal silhouette is within normal limits. There is a calcified granuloma in the left mid lung, unchanged from prior. No acute fractures are seen. IMPRESSION: 1. Electronically Signed   By: Ronney Asters M.D.   On: 03/27/2022 19:19  ? ?CT ABDOMEN PELVIS W CONTRAST ? ?Result Date: 03/27/2022 ?CLINICAL DATA:  Nausea/vomiting Abdominal pain, acute, nonlocalized Fever. lower abdominal pain (acute on chronic) with 7 bowel movements today and vomited 6 times. EXAM: CT ABDOMEN AND PELVIS WITH CONTRAST TECHNIQUE: Multidetector CT imaging of the abdomen and pelvis was performed using the standard protocol following bolus administration of intravenous contrast. RADIATION DOSE REDUCTION: This exam was performed according to the departmental dose-optimization program which includes automated exposure control, adjustment of the mA and/or kV according to patient size and/or use of iterative reconstruction technique. CONTRAST:  159m OMNIPAQUE IOHEXOL 300 MG/ML  SOLN COMPARISON:  None. FINDINGS: Lower chest: Mild bibasilar atelectasis/scarring. Hepatobiliary: Few scattered calcified granulomas. Cholecystectomy. No biliary dilatation. Pancreas: Unremarkable. Spleen: Top-normal in size.  Few scattered calcified granulomas. Adrenals/Urinary Tract: Adrenals are unremarkable. Left renal atrophy. Minimal punctate 1 mm nonobstructing left  renal calculi. 2 mm focus of calcification at the left ureterovesical junction (series 2, image 70). There is mild prominence of the left ureter and left renal pelvis. Partially distended bladder is unremarkable. Stomach/Bowel

## 2022-03-27 NOTE — H&P (Signed)
?History and Physical  ? ? ?Patient: Tasha Roberts DHR:[id number] DOB: [birth year] ?DOA: 03/27/2022 ?DOS: the patient was seen and examined on 03/28/2022 ?PCP: Lindell Spar, MD  ?Patient coming from: Home ? ?Chief Complaint:  ?Chief Complaint  ?Patient presents with  ? Abdominal Pain  ? ?HPI: Tasha Roberts is a 81 y.o. female with medical history significant of essential hypertension, acquired hypothyroidism, PAF on Eliquis, hyperlipidemia, GERD, liver cirrhosis due to NASH, hypertension who presents to the emergency department due to nausea, vomiting, abdominal pain and several episodes of bowel movements which started this morning.  She endorsed about 6 episodes of nonbloody vomiting, stools were well formed and lacked any blood in it.  She also complained of lower abdominal pain which was crampy in nature, but more severe than prior abdominal pain within the same area.  Patient has a history of chronic abdominal pain and though the current pain was within the same area as a chronic abdominal pain, it was more severe than usual.   ?She follows with gastroenterologist at [facility] due to her history of Karlene Lineman and EGD done on April 23 to evaluate for esophageal varices was normal, though it was noted that she had portal hypertensive gastropathy which was moderate in the stomach, colonoscopy done on same day was normal except for hemorrhoids in the perianal exam per ED medical record. ? ?ED Course:  ?In the emergency department, she was hemodynamically stable.  Work-up in the ED showed normocytic anemia and thrombocytopenia, hypokalemia.  BUN/creatinine 30/1.07, EGFR 53.  Lipase 38, lactic acid was normal.  Influenza A, B, SARS coronavirus 2 was negative. ?Chest x-ray showed no focal lung consolidation, pleural effusion or pneumothorax. ?CT abdomen and pelvis with contrast showed 2 mm calcification at the left ureterovesical junction without ?rounded configuration of a stone. ?Tylenol was given, she was started on IV  ceftriaxone. ?Urologist (Dr. Milford Cage) at [facility] was consulted and recommended admitting patient to [facility] in case she does require stent placement and only need to treat this patient with IV antibiotics, IV fluids and medical management. ? ?Review of Systems: ?Review of systems as noted in the HPI. All other systems reviewed and are negative. ? ? ?Past Medical History:  ?Diagnosis Date  ? Anemia due to blood loss   ? iron infusion's last one 03-25-2021 (pt bleeding post vulvar bx 02-11-2021, hospiatal admission 02-21-2021)  ? Anticoagulant long-term use   ? eliquis--- managed by pcp   ? Arthritis   ? hands  ? Bronchiectasis ([place])   ? pulmonology --- dr Halford Chessman   (04-01-2021  pt stated no supplemental oxygen use since 01/ 2022 and no inhaler use,  stated checks O2 at home during the day 98-99%)  ? Cancer [facility]) 2013  ? Lumpectomy left breast  ? Cataract 2015  ? Chronic constipation   ? Clotting disorder ([place])   ? On eliquis  ? Coronary artery calcification   ? per lexiscan 08-17-2019 result in care everywhere  ? Depression, major, single episode, moderate ([place]) 12/05/2021  ? DM II (diabetes mellitus, type II), controlled ([place]) 08/27/2005  ? DOE (dyspnea on exertion)   ? 04-01-2021  per pt only going up stairs   ? Gastroesophageal reflux disease 12/05/2021  ? History of diabetes mellitus, type II   ? 04-01-2021  per pt no issues after weight loss, last taken medication approx 2017  ? History of Graves' disease 2017  ? s/p RAI  ? History of left breast cancer   ?  dx 2013,  s/p left partial mastecotmy w/ node dissection 09-15-2012, low grade DCIS, no chemo, completed radiation 02/ 2014,  per pt no recurrence  ? History of respiratory syncytial virus (RSV) infection 09/2020  ? w/ hypoxia  , hospital admission 10/ 2021  and acute exacerbation bronchiectasis;  follow up post hospital pulmonogy dr sood  note in epic  ? Hyperlipidemia   ? Hypertension   ? followed by pcp   (pt had lexiscan in care everywhere dated  08-17-2019 no ischemia , non-obstructive extensive calcification, ef 69%)  ? Hypothyroidism   ? Hypothyroidism, postradioiodine therapy   ? followed by pcp  ? Liver cirrhosis secondary to NASH (nonalcoholic steatohepatitis) ([place])   ? followed by dr Merrilee Jansky ([facility])---- dx 2004,  compensated ,  last liver bx (03/ 2015) fibrosis stage 3;  moderate portal hypertensive gastrophy  ? PAF (paroxysmal atrial fibrillation) [facility]) previous cardiologist lov note w/ Suann Larry PA ([facility] in [place]) dated 01-24-2020 in care everywhere;  (04-01-2021 per pt has appt w/ new cardiologist , dr j. branch 04-30-2021)  ? first dx 2020--  had event monitor/ stress test/ echo all results in care everywhere (monitor 08-03-2019 short runs AFib conversion pause <2 secconds, rate controlled;  echo 12-14-2019 ef 60%, mild concentric LVH, G2DD, RSVP 50.28mmHg)  ? Personal history of radiation therapy 2013  ? left breast cancer,  completed 02/ 2014  ? PONV (postoperative nausea and vomiting)   ? Sarcoidosis, lung [facility]) pulmonology--- dr Halford Chessman  ? 04-01-2021  per pt dx age 30s , no issue since  ? Thrombocytopenia ([place])   ? hematology/ oncology--- dr s. Delton Coombes  ? VIN III (vulvar intraepithelial neoplasia III)   ? ?Past Surgical History:  ?Procedure Laterality Date  ? ABDOMINAL HYSTERECTOMY  1989  ? W/  UNILATERAL SALPINGOOPHORECTOMY  ? APPENDECTOMY  1989  ? BREAST SURGERY  09/15/2012  ? CATARACT EXTRACTION W/ INTRAOCULAR LENS  IMPLANT, BILATERAL  2016  ? CHOLECYSTECTOMY OPEN  1987  ? CO2 LASER APPLICATION N/A 70/96/2836  ? Procedure: CO2 LASER APPLICATION OF THE VULVA;  Surgeon: Everitt Amber, MD;  [facility];  Service: Gynecology;  Laterality: N/A;  ? COLONOSCOPY WITH ESOPHAGOGASTRODUODENOSCOPY (EGD)  last one 08/ 2018  [facility]   ? EYE SURGERY  2015  ? Cataract removed both eyes  ? LUMBAR LAMINECTOMY  2009  ? PARTIAL MASTECTOMY WITH AXILLARY SENTINEL LYMPH NODE BIOPSY Left 09-15-2012    [facility]  ? TUBAL LIGATION  08/08/1969  ? ? ?Social History:  reports that she has never smoked. She has never used smokeless tobacco. She reports that she does not currently use alcohol. She reports that she does not use drugs. ? ? ?Allergies  ?Allergen Reactions  ? Dicyclomine Other (See Comments)  ?  Dry cough  ? Glucophage [Metformin] Diarrhea  ? Losartan Other (See Comments)  ?  Heartburn ?  ? ? ?Family History  ?Problem Relation Age of Onset  ? Hypertension Mother   ? Dementia Mother   ? Vision loss Mother   ? Heart disease Father   ? Hearing loss Father   ? Stroke Maternal Grandfather   ? Heart disease Brother   ?  ? ?Prior to Admission medications   ?Medication Sig Start Date End Date Taking? Authorizing Provider  ?apixaban (ELIQUIS) 5 MG TABS tablet TAKE (1) TABLET BY MOUTH TWICE DAILY. ?Patient taking differently: Take 5 mg by mouth 2 (two) times daily. 09/19/21  Yes Carlyle Dolly  F, MD  ?atorvastatin (LIPITOR) 20 MG tablet TAKE (1) TABLET BY MOUTH ONCE DAILY. ?Patient taking differently: Take 20 mg by mouth daily. 03/05/22  Yes Lindell Spar, MD  ?Cholecalciferol 50 MCG (2000 UT) CAPS Take 2,000 mg by mouth daily.   Yes [provider]  ?clobetasol ointment (TEMOVATE) 0.05 % Apply topically at bedtime. 01/08/22  Yes [provider]  ?diltiazem (TIAZAC) 120 MG 24 hr capsule Take 120 mg by mouth daily.   Yes [provider]  ?famotidine (PEPCID) 20 MG tablet Take 1 tablet (20 mg total) by mouth 2 (two) times daily as needed for heartburn or indigestion. 12/05/21  Yes Lindell Spar, MD  ?levothyroxine (SYNTHROID) 100 MCG tablet TAKE 1 TABLET BY MOUTH ON AN EMPTY STOMACH 30 MINUTES BEFORE BREAKFAST. ?Patient taking differently: Take 100 mcg by mouth daily before breakfast. 10/22/21  Yes Lindell Spar, MD  ?lisinopril-hydrochlorothiazide (ZESTORETIC) 20-25 MG tablet TAKE (1) TABLET BY MOUTH EACH MORNING. ?Patient taking differently: Take 1 tablet by mouth daily. 10/22/21  Yes  Lindell Spar, MD  ?mupirocin ointment (BACTROBAN) 2 % Apply 1 application topically 2 (two) times daily. 09/18/21  Yes Volney American, PA-C  ?nitroGLYCERIN (NITROSTAT) 0.4 MG SL tablet Place 1 tablet (0.4 mg

## 2022-03-27 NOTE — ED Triage Notes (Signed)
Pt presents via EMS for abdominal pain with N/V/D that started this am. ?

## 2022-03-27 NOTE — ED Notes (Signed)
Ambulatory to restroom. New brief provided and warm blanket given ?

## 2022-03-28 DIAGNOSIS — R112 Nausea with vomiting, unspecified: Secondary | ICD-10-CM

## 2022-03-28 DIAGNOSIS — A419 Sepsis, unspecified organism: Secondary | ICD-10-CM | POA: Diagnosis not present

## 2022-03-28 DIAGNOSIS — N390 Urinary tract infection, site not specified: Secondary | ICD-10-CM | POA: Diagnosis not present

## 2022-03-28 DIAGNOSIS — N201 Calculus of ureter: Principal | ICD-10-CM

## 2022-03-28 DIAGNOSIS — E876 Hypokalemia: Secondary | ICD-10-CM

## 2022-03-28 LAB — CBC
HCT: 28.9 % — ABNORMAL LOW (ref 36.0–46.0)
Hemoglobin: 9.4 g/dL — ABNORMAL LOW (ref 12.0–15.0)
MCH: 29.7 pg (ref 26.0–34.0)
MCHC: 32.5 g/dL (ref 30.0–36.0)
MCV: 91.5 fL (ref 80.0–100.0)
Platelets: 56 10*3/uL — ABNORMAL LOW (ref 150–400)
RBC: 3.16 MIL/uL — ABNORMAL LOW (ref 3.87–5.11)
RDW: 13.3 % (ref 11.5–15.5)
WBC: 4.6 10*3/uL (ref 4.0–10.5)
nRBC: 0 % (ref 0.0–0.2)

## 2022-03-28 LAB — COMPREHENSIVE METABOLIC PANEL
ALT: 14 U/L (ref 0–44)
AST: 20 U/L (ref 15–41)
Albumin: 2.7 g/dL — ABNORMAL LOW (ref 3.5–5.0)
Alkaline Phosphatase: 48 U/L (ref 38–126)
Anion gap: 5 (ref 5–15)
BUN: 24 mg/dL — ABNORMAL HIGH (ref 8–23)
CO2: 23 mmol/L (ref 22–32)
Calcium: 8.3 mg/dL — ABNORMAL LOW (ref 8.9–10.3)
Chloride: 112 mmol/L — ABNORMAL HIGH (ref 98–111)
Creatinine, Ser: 0.91 mg/dL (ref 0.44–1.00)
GFR, Estimated: >60 mL/min (ref >60)
Glucose, Bld: 89 mg/dL (ref 70–99)
Potassium: 4.3 mmol/L (ref 3.5–5.1)
Sodium: 140 mmol/L (ref 135–145)
Total Bilirubin: 0.6 mg/dL (ref 0.3–1.2)
Total Protein: 5 g/dL — ABNORMAL LOW (ref 6.5–8.1)

## 2022-03-28 LAB — MAGNESIUM: Magnesium: 1.8 mg/dL (ref 1.7–2.4)

## 2022-03-28 LAB — PHOSPHORUS: Phosphorus: 2.9 mg/dL (ref 2.5–4.6)

## 2022-03-28 MED ORDER — POTASSIUM CHLORIDE CRYS ER 20 MEQ PO TBCR
40.0000 meq | EXTENDED_RELEASE_TABLET | Freq: Once | ORAL | Status: AC
  Administered 2022-03-28: 40 meq via ORAL
  Filled 2022-03-28: qty 2

## 2022-03-28 MED ORDER — HYDRALAZINE HCL 20 MG/ML IJ SOLN
10.0000 mg | Freq: Four times a day (QID) | INTRAMUSCULAR | Status: DC | PRN
Start: 2022-03-28 — End: 2022-03-30

## 2022-03-28 MED ORDER — DILTIAZEM HCL ER COATED BEADS 120 MG PO CP24
120.0000 mg | ORAL_CAPSULE | Freq: Every day | ORAL | Status: DC
  Administered 2022-03-28: 120 mg via ORAL
  Filled 2022-03-28: qty 1

## 2022-03-28 MED ORDER — DILTIAZEM HCL ER COATED BEADS 120 MG PO CP24
120.0000 mg | ORAL_CAPSULE | Freq: Every day | ORAL | Status: DC
  Administered 2022-03-29 – 2022-03-30 (×2): 120 mg via ORAL
  Filled 2022-03-28 (×2): qty 1

## 2022-03-28 MED ORDER — NITROGLYCERIN 0.4 MG SL SUBL: 0.4000 mg | SUBLINGUAL_TABLET | SUBLINGUAL | Status: DC | PRN

## 2022-03-28 MED ORDER — ATORVASTATIN CALCIUM 20 MG PO TABS
20.0000 mg | ORAL_TABLET | Freq: Every day | ORAL | Status: DC
  Administered 2022-03-28 – 2022-03-30 (×3): 20 mg via ORAL
  Filled 2022-03-28: qty 1
  Filled 2022-03-28: qty 2
  Filled 2022-03-28: qty 1

## 2022-03-28 MED ORDER — URSODIOL 300 MG PO CAPS
300.0000 mg | ORAL_CAPSULE | Freq: Two times a day (BID) | ORAL | Status: DC
  Administered 2022-03-28 – 2022-03-29 (×4): 300 mg via ORAL
  Filled 2022-03-28 (×12): qty 1

## 2022-03-28 MED ORDER — FAMOTIDINE 20 MG PO TABS: 20.0000 mg | ORAL_TABLET | Freq: Two times a day (BID) | ORAL | Status: DC | PRN

## 2022-03-28 MED ORDER — LISINOPRIL 10 MG PO TABS: 20.0000 mg | ORAL_TABLET | Freq: Every day | ORAL | Status: DC

## 2022-03-28 MED ORDER — FENTANYL CITRATE PF 50 MCG/ML IJ SOSY
25.0000 ug | PREFILLED_SYRINGE | INTRAMUSCULAR | Status: DC | PRN
Start: 2022-03-28 — End: 2022-03-30

## 2022-03-28 MED ORDER — LABETALOL HCL 5 MG/ML IV SOLN
10.0000 mg | INTRAVENOUS | Status: DC | PRN
  Filled 2022-03-28: qty 4

## 2022-03-28 MED ORDER — SODIUM CHLORIDE 0.9 % IV SOLN: INTRAVENOUS | Status: DC

## 2022-03-28 MED ORDER — LISINOPRIL-HYDROCHLOROTHIAZIDE 20-25 MG PO TABS: 1.0000 | ORAL_TABLET | Freq: Every day | ORAL | Status: DC

## 2022-03-28 MED ORDER — LEVOTHYROXINE SODIUM 100 MCG PO TABS
100.0000 ug | ORAL_TABLET | Freq: Every day | ORAL | Status: DC
  Administered 2022-03-28 – 2022-03-30 (×3): 100 ug via ORAL
  Filled 2022-03-28: qty 2
  Filled 2022-03-28 (×2): qty 1

## 2022-03-28 MED ORDER — FENTANYL CITRATE PF 50 MCG/ML IJ SOSY
50.0000 ug | PREFILLED_SYRINGE | Freq: Once | INTRAMUSCULAR | Status: AC
  Administered 2022-03-28: 50 ug via INTRAVENOUS
  Filled 2022-03-28: qty 1

## 2022-03-28 MED ORDER — OXYCODONE HCL 5 MG PO TABS: 5.0000 mg | ORAL_TABLET | ORAL | Status: DC | PRN

## 2022-03-28 MED ORDER — LEVOTHYROXINE SODIUM 50 MCG PO TABS: 100.0000 ug | ORAL_TABLET | Freq: Every day | ORAL | Status: DC

## 2022-03-28 MED ORDER — HYDROCHLOROTHIAZIDE 25 MG PO TABS: 25.0000 mg | ORAL_TABLET | Freq: Every day | ORAL | Status: DC

## 2022-03-28 NOTE — Consult Note (Signed)
Urology Consult  ?Referring physician: Dr. Josephine Cables ?Reason for referral: left ureteral calculus, UTI ? ?Chief Complaint: left flank pain ? ?History of Present Illness: Tasha Roberts is a47yo with a history of CAD, DMII, afib and clotting disorder on eliquis, and nephrolithiasis who presented to [facility] ER yesterday with a 2 day history of left flank pain. She had 11 prior stone events. The pain was sharp, intermittent moderate and nonraditing. She denies nausea or vomiting. Ct scan obtained in the ER shows a 1-24m left UVJ calculus with mild ureteral fullness. She had a fever of 101 yesterday. UA showed rare bacteria and 6-10 WBCs. Currently she denies any flank pain. No recent fevers. WBC count normal. No other associated symptoms. No exacerbating/alleviating events.  ? ?Past Medical History:  ?Diagnosis Date  ? Anemia due to blood loss   ? iron infusion's last one 03-25-2021 (pt bleeding post vulvar bx 02-11-2021, hospiatal admission 02-21-2021)  ? Anticoagulant long-term use   ? eliquis--- managed by pcp   ? Arthritis   ? hands  ? Bronchiectasis ([place]   ? pulmonology --- dr sHalford Chessman  (04-01-2021  pt stated no supplemental oxygen use since 01/ 2022 and no inhaler use,  stated checks O2 at home during the day 98-99%)  ? Cancer ([facility] 2013  ? Lumpectomy left breast  ? Cataract 2015  ? Chronic constipation   ? Clotting disorder ([place]   ? On eliquis  ? Coronary artery calcification   ? per lexiscan 08-17-2019 result in care everywhere  ? Depression, major, single episode, moderate ([place] 12/05/2021  ? DM II (diabetes mellitus, type II), controlled ([facility] 08/27/2005  ? DOE (dyspnea on exertion)   ? 04-01-2021  per pt only going up stairs   ? Gastroesophageal reflux disease 12/05/2021  ? History of diabetes mellitus, type II   ? 04-01-2021  per pt no issues after weight loss, last taken medication approx 2017  ? History of Graves' disease 2017  ? s/p RAI  ? History of left breast cancer   ? dx 2013,  s/p left partial mastecotmy  w/ node dissection 09-15-2012, low grade DCIS, no chemo, completed radiation 02/ 2014,  per pt no recurrence  ? History of respiratory syncytial virus (RSV) infection 09/2020  ? w/ hypoxia  , hospital admission 10/ 2021  and acute exacerbation bronchiectasis;  follow up post hospital pulmonogy dr sood  note in epic  ? Hyperlipidemia   ? Hypertension   ? followed by pcp   (pt had lexiscan in care everywhere dated 08-17-2019 no ischemia , non-obstructive extensive calcification, ef 69%)  ? Hypothyroidism   ? Hypothyroidism, postradioiodine therapy   ? followed by pcp  ? Liver cirrhosis secondary to NASH (nonalcoholic steatohepatitis) ([place]   ? followed by dr bMerrilee Jansky([facility])---- dx 2004,  compensated ,  last liver bx (03/ 2015) fibrosis stage 3;  moderate portal hypertensive gastrophy  ? PAF (paroxysmal atrial fibrillation) ([facility] previous cardiologist lov note w/ R[place]ann LarryPA [place] dated 01-24-2020 in care everywhere;  (04-01-2021 per pt has appt w/ new cardiologist , dr j. branch 04-30-2021)  ? first dx 2020--  had event monitor/ stress test/ echo all results in care everywhere (monitor 08-03-2019 short runs AFib conversion pause <2 secconds, rate controlled;  echo 12-14-2019 ef 60%, mild concentric LVH, G2DD, RSVP 50.761mg)  ? Personal history of radiation therapy 2013  ? left breast cancer,  completed 02/ 2014  ? PONV (postoperative nausea  and vomiting)   ? Sarcoidosis, lung [facility]) pulmonology--- dr Halford Chessman  ? 04-01-2021  per pt dx age 27s , no issue since  ? Thrombocytopenia ([place])   ? hematology/ oncology--- dr s. Delton Coombes  ? VIN III (vulvar intraepithelial neoplasia III)   ? ?Past Surgical History:  ?Procedure Laterality Date  ? ABDOMINAL HYSTERECTOMY  1989  ? W/  UNILATERAL SALPINGOOPHORECTOMY  ? APPENDECTOMY  1989  ? BREAST SURGERY  09/15/2012  ? CATARACT EXTRACTION W/ INTRAOCULAR LENS  IMPLANT, BILATERAL  2016  ? CHOLECYSTECTOMY OPEN  1987  ? CO2 LASER  APPLICATION N/A 27/02/5008  ? Procedure: CO2 LASER APPLICATION OF THE VULVA;  Surgeon: Everitt Amber, MD;  Location: [facility];  Service: Gynecology;  Laterality: N/A;  ? COLONOSCOPY WITH ESOPHAGOGASTRODUODENOSCOPY (EGD)  last one 08/ 2018  '@[facility]   ? EYE SURGERY  2015  ? Cataract removed both eyes  ? LUMBAR LAMINECTOMY  2009  ? PARTIAL MASTECTOMY WITH AXILLARY SENTINEL LYMPH NODE BIOPSY Left 09-15-2012   [facility]  ? TUBAL LIGATION  08/08/1969  ? ? ?Medications: I have reviewed the patient's current medications. ?Allergies:  ?Allergies  ?Allergen Reactions  ? Dicyclomine Other (See Comments)  ?  Dry cough  ? Glucophage [Metformin] Diarrhea  ? Losartan Other (See Comments)  ?  Heartburn ?  ? ? ?Family History  ?Problem Relation Age of Onset  ? Hypertension Mother   ? Dementia Mother   ? Vision loss Mother   ? Heart disease Father   ? Hearing loss Father   ? Stroke Maternal Grandfather   ? Heart disease Brother   ? ?Social History:  reports that she has never smoked. She has never used smokeless tobacco. She reports that she does not currently use alcohol. She reports that she does not use drugs. ? ?Review of Systems  ?Genitourinary:  Positive for flank pain.  ?All other systems reviewed and are negative. ? ?Physical Exam:  ?Vital signs in last 24 hours: ?Temp:  [97.7 ?F (36.5 ?C)-101.1 ?F (38.4 ?C)] 97.8 ?F (36.6 ?C) (04/21 1526) ?Pulse Rate:  [56-88] 58 (04/21 1526) ?Resp:  [14-22] 14 (04/21 1526) ?BP: (106-137)/(44-65) 113/46 (04/21 1526) ?SpO2:  [94 %-100 %] 100 % (04/21 1526) ?Physical Exam ?Vitals reviewed.  ?Constitutional:   ?   Appearance: She is well-developed.  ?HENT:  ?   Head: Normocephalic and atraumatic.  ?Eyes:  ?   Extraocular Movements: Extraocular movements intact.  ?   Pupils: Pupils are equal, round, and reactive to light.  ?Cardiovascular:  ?   Rate and Rhythm: Normal rate and regular rhythm.  ?Pulmonary:  ?   Effort: Pulmonary effort is normal. No respiratory distress.  ?Abdominal:   ?   General: Abdomen is flat. There is no distension.  ?   Palpations: Abdomen is rigid.  ?Skin: ?   General: Skin is warm and dry.  ?Neurological:  ?   General: No focal deficit present.  ?   Mental Status: She is alert and oriented to person, place, and time.  ?Psychiatric:     ?   Mood and Affect: Mood normal.     ?   Behavior: Behavior normal.  ? ? ?Laboratory Data:  ?Results for orders placed or performed during the hospital encounter of 03/27/22 (from the past 72 hour(s))  ?Comprehensive metabolic panel     Status: Abnormal  ? Collection Time: 03/27/22  3:00 PM  ?Result Value Ref Range  ? Sodium 139 135 - 145 mmol/L  ?  Potassium 3.2 (L) 3.5 - 5.1 mmol/L  ? Chloride 107 98 - 111 mmol/L  ? CO2 25 22 - 32 mmol/L  ? Glucose, Bld 118 (H) 70 - 99 mg/dL  ?  Comment: Glucose reference range applies only to samples taken after fasting for at least 8 hours.  ? BUN 30 (H) 8 - 23 mg/dL  ? Creatinine, Ser 1.07 (H) 0.44 - 1.00 mg/dL  ? Calcium 9.6 8.9 - 10.3 mg/dL  ? Total Protein 6.8 6.5 - 8.1 g/dL  ? Albumin 3.8 3.5 - 5.0 g/dL  ? AST 24 15 - 41 U/L  ? ALT 16 0 - 44 U/L  ? Alkaline Phosphatase 67 38 - 126 U/L  ? Total Bilirubin 0.9 0.3 - 1.2 mg/dL  ? GFR, Estimated 53 (L) >60 mL/min  ?  Comment: (NOTE) ?Calculated using the CKD-EPI Creatinine Equation (2021) ?  ? Anion gap 7 5 - 15  ?  Comment: Performed at [facility], [address] [place], [place] [address]  ?Lactic acid, plasma     Status: None  ? Collection Time: 03/27/22  3:00 PM  ?Result Value Ref Range  ? Lactic Acid, Venous 1.5 0.5 - 1.9 mmol/L  ?  Comment: Performed at [facility], [address] [place], [place] [address]  ?Lipase, blood     Status: None  ? Collection Time: 03/27/22  3:00 PM  ?Result Value Ref Range  ? Lipase 30 11 - 51 U/L  ?  Comment: Performed at [facility], [address], [place], [place] [address]  ?CBC with Differential     Status: Abnormal  ? Collection Time: 03/27/22  3:00 PM  ?Result Value Ref Range  ? WBC 7.2 4.0 - 10.5 K/uL  ?  RBC 3.88 3.87 - 5.11 MIL/uL  ? Hemoglobin 11.9 (L) 12.0 - 15.0 g/dL  ? HCT 34.9 (L) 36.0 - 46.0 %  ? MCV 89.9 80.0 - 100.0 fL  ? MCH 30.7 26.0 - 34.0 pg  ? MCHC 34.1 30.0 - 36.0 g/dL  ? RDW 13.5 11.5 - 15.5 %  ?

## 2022-03-28 NOTE — ED Notes (Signed)
Emokpae, MD notified re: BP 120/44 and asking if he want the 120 mg Cardizem ER tab to be administered ?

## 2022-03-28 NOTE — Progress Notes (Signed)
?PROGRESS NOTE ? ? ? ? ?Tasha Roberts, is a 81 y.o. female, DOB - 06-21-1941, [id number] ? ?Admit date - 03/27/2022   Admitting Physician Bernadette Hoit, DO ? ?Outpatient Primary MD for the patient is Lindell Spar, MD ? ?LOS - 1 ? ?Chief Complaint  ?Patient presents with  ? Abdominal Pain  ?    ? ? ?Brief Narrative:  ? 81 y.o. female with medical history significant of essential hypertension, acquired hypothyroidism, PAF on Eliquis, hyperlipidemia, GERD, liver cirrhosis due to NASH, hypertension admitted with with Tasha Roberts hospital on 03/27/2022 from [facility], ED with concerns for sepsis secondary to infected kidney stone for further urology evaluation ?  ?-Assessment and Plan: ? ?Sepsis secondary to UTI/infected kidney stone--POA ?Patient met sepsis criteria on admission ?Continue IV ceftriaxone pending culture data ?Continue IV hydration ?Continue Tylenol ?-Urology consult requested and appreciated ?  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 Alyson Ingles urology ? ?Hypothyroidism--- continue levothyroxine 100 mcg daily ? ?HLD--- continue atorvastatin 20 mg daily ? ?Chronic thrombocytopenia ?-Platelets are dropping further currently down to 56, monitor closely given the fact that patient needs urological intervention ?-No acute bleeding noted at this time ?-Patient has underlying liver cirrhosis secondary to [facility] which will be contributing to patient's thrombocytopenia and anemia ? ?Acute anemia--hemoglobin is down to 9.4 suspect some component of hemodilution ?-Check stool occult blood ?-No obvious bleeding reported or noted ? ?Hypokalemia ?Resolved with replacement, magnesium and phosphorus WNL ?  ?Essential hypertension ?Hold lisinopril HCTZ ?IV hydralazine as needed elevated BP ? ?Chronic atrial fibrillation ?Eliquis will be held at this time in anticipation for possible urological procedure ?Continue d Cardizem CD 120 mg for rate control ? ?GERD ?Continue  Pepcid ?  ?Liver cirrhosis due to NASH ?Patient follows with [facility] ?-Underlying liver cirrhosis probably contributing to patient's thrombocytopenia and anemia ?-Check INR ? ?Disposition/Need for in-Hospital Stay- patient unable to be discharged at this time due to sepsis secondary to presumed urinary source requiring IV antibiotics and possible urological intervention* ? ?Status is: Inpatient  ? ?Disposition: The patient is from: Home ?             Anticipated d/c is to: Home ?             Anticipated d/c date is: 2 days ?             Patient currently is not medically stable to d/c. ?Barriers: Not Clinically Stable-  ? ?Code Status :  -  Code Status: Full Code  ? ?Family Communication:   NA (patient is alert, awake and coherent)  ? ?DVT Prophylaxis  :   - SCDs   SCDs Start: 03/28/22 0132 ? ? ?Lab Results  ?Component Value Date  ? PLT 56 (L) 03/28/2022  ? ? ?Inpatient Medications ? ?Scheduled Meds: ? atorvastatin  20 mg Oral Daily  ? diltiazem  120 mg Oral Daily  ? levothyroxine  100 mcg Oral Q0600  ? ursodiol  300 mg Oral BID  ? ?Continuous Infusions: ? sodium chloride 75 mL/hr at 03/28/22 0123  ? cefTRIAXone (ROCEPHIN)  IV Stopped (03/27/22 1952)  ? ?PRN Meds:.acetaminophen, famotidine, labetalol, nitroGLYCERIN, ondansetron (ZOFRAN) IV ? ? ?Anti-infectives (From admission, onward)  ? ? Start     Dose/Rate Route Frequency Ordered Stop  ? 03/27/22 1800  cefTRIAXone (ROCEPHIN) 2 g in sodium chloride 0.9 % 100 mL IVPB       ? 2 g ?200 mL/hr over  30 Minutes Intravenous Every 24 hours 03/27/22 1750 04/03/22 1759  ? ?  ? ?  ? ?Subjective: ?Tasha Roberts today has no fevers, no emesis,  No chest pain,   ?-Complains of left flank pain ? ? ?Objective: ?Vitals:  ? 03/28/22 1022 03/28/22 1056 03/28/22 1106 03/28/22 1206  ?BP: (!) 120/44  (!) 120/44 (!) 126/58  ?Pulse:   61 (!) 56  ?Resp:   16 16  ?Temp:  97.7 ?F (36.5 ?C)  97.7 ?F (36.5 ?C)  ?TempSrc:  Oral    ?SpO2:   94% 100%  ?Weight:      ?Height:      ? ?No intake or  output data in the 24 hours ending 03/28/22 1207 ?Filed Weights  ? 03/27/22 1424  ?Weight: 60.3 kg  ? ? ?Physical Exam ? ?Gen:- Awake Alert, comfortable with left flank pain ?HEENT:- [place].AT, No sclera icterus ?Neck-Supple Neck,No JVD,.  ?Lungs-  CTAB , fair symmetrical air movement ?CV- S1, S2 normal, irregular ?Abd-  +ve B.Sounds, Abd Soft, left-sided CVA tenderness ?Extremity/Skin:- No  edema, pedal pulses present  ?Psych-affect is appropriate, oriented x3 ?Neuro-no new focal deficits, no tremors ? ?Data Reviewed: I have personally reviewed following labs and imaging studies ? ?CBC: ?Recent Labs  ?Lab 03/27/22 ?1500 03/28/22 ?8003  ?WBC 7.2 4.6  ?NEUTROABS 6.3  --   ?HGB 11.9* 9.4*  ?HCT 34.9* 28.9*  ?MCV 89.9 91.5  ?PLT 84* 56*  ? ?Basic Metabolic Panel: ?Recent Labs  ?Lab 03/27/22 ?1500 03/28/22 ?4917  ?NA 139 140  ?K 3.2* 4.3  ?CL 107 112*  ?CO2 25 23  ?GLUCOSE 118* 89  ?BUN 30* 24*  ?CREATININE 1.07* 0.91  ?CALCIUM 9.6 8.3*  ?MG  --  1.8  ?PHOS  --  2.9  ? ?GFR: ?Estimated Creatinine Clearance: 42.1 mL/min (by C-G formula based on SCr of 0.91 mg/dL). ?Liver Function Tests: ?Recent Labs  ?Lab 03/27/22 ?1500 03/28/22 ?9150  ?AST 24 20  ?ALT 16 14  ?ALKPHOS 67 48  ?BILITOT 0.9 0.6  ?PROT 6.8 5.0*  ?ALBUMIN 3.8 2.7*  ? ?Cardiac Enzymes: ?No results for input(s): CKTOTAL, CKMB, CKMBINDEX, TROPONINI in the last 168 hours. ?BNP (last 3 results) ?No results for input(s): PROBNP in the last 8760 hours. ?HbA1C: ?No results for input(s): HGBA1C in the last 72 hours. ?Sepsis Labs: ?_0 (procalcitonin:4,lacticidven:4) ?) ?Recent Results (from the past 240 hour(s))  ?Culture, blood (routine x 2)     Status: None (Preliminary result)  ? Collection Time: 03/27/22  3:00 PM  ? Specimen: BLOOD  ?Result Value Ref Range Status  ? Specimen Description BLOOD RIGHT ANTECUBITAL  Final  ? Special Requests   Final  ?  BOTTLES DRAWN AEROBIC AND ANAEROBIC Blood Culture adequate volume  ? Culture   Final  ?  NO GROWTH < 24  HOURS ?Performed at [facility], [address], [place], [place] [address] ?  ? Report Status PENDING  Incomplete  ?Resp Panel by RT-PCR (Flu A&B, Covid) Nasopharyngeal Swab     Status: None  ? Collection Time: 03/27/22  3:33 PM  ? Specimen: Nasopharyngeal Swab; Nasopharyngeal(NP) swabs in vial transport medium  ?Result Value Ref Range Status  ? SARS Coronavirus 2 by RT PCR NEGATIVE NEGATIVE Final  ?  Comment: (NOTE) ?SARS-CoV-2 target nucleic acids are NOT DETECTED. ? ?The SARS-CoV-2 RNA is generally detectable in upper respiratory ?specimens during the acute phase of infection. The lowest ?concentration of SARS-CoV-2 viral copies this assay can detect is ?138 copies/mL. A negative  result does not preclude SARS-Cov-2 ?infection and should not be used as the sole basis for treatment or ?other patient management decisions. A negative result may occur with  ?improper specimen collection/handling, submission of specimen other ?than nasopharyngeal swab, presence of viral mutation(s) within the ?areas targeted by this assay, and inadequate number of viral ?copies(<138 copies/mL). A negative result must be combined with ?clinical observations, patient history, and epidemiological ?information. The expected result is Negative. ? ?Fact Sheet for Patients:  ?EntrepreneurPulse.com.au ? ?Fact Sheet for Healthcare Providers:  ?IncredibleEmployment.be ? ?This test is no t yet approved or cleared by the [place] FDA and  ?has been authorized for detection and/or diagnosis of SARS-CoV-2 by ?FDA under an Emergency Use Authorization (EUA). This EUA will remain  ?in effect (meaning this test can be used) for the duration of the ?COVID-19 declaration under Section 564(b)(1) of the Act, 21 ?U.S.C.section 360bbb-3(b)(1), unless the authorization is terminated  ?or revoked sooner.  ? ? ?  ? Influenza A by PCR NEGATIVE NEGATIVE Final  ? Influenza B by PCR NEGATIVE NEGATIVE Final  ?  Comment:  (NOTE) ?The Xpert Xpress SARS-CoV-2/FLU/RSV plus assay is intended as an aid ?in the diagnosis of influenza from Nasopharyngeal swab specimens and ?should not be used as a sole basis for treatment. Nasal washings and ?a

## 2022-03-28 NOTE — TOC Progression Note (Signed)
?  Transition of Care (TOC) Screening Note ? ? ?Patient Details  ?Name: Tasha Roberts ?Date of Birth: 06-May-1941 ? ? ?Transition of Care (TOC) CM/SW Contact:    ?Shade Flood, LCSW ?Phone Number: ?03/28/2022, 11:49 AM ? ? ? ?Pt to transfer to [facility]. TOC at [facility] will follow. ? ?Transition of Care Department [facility]) has reviewed patient and no TOC needs have been identified at this time. We will continue to monitor patient advancement through interdisciplinary progression rounds. If new patient transition needs arise, please place a TOC consult. ? ? ?

## 2022-03-29 DIAGNOSIS — A419 Sepsis, unspecified organism: Secondary | ICD-10-CM | POA: Diagnosis not present

## 2022-03-29 DIAGNOSIS — E872 Acidosis, unspecified: Secondary | ICD-10-CM

## 2022-03-29 DIAGNOSIS — N390 Urinary tract infection, site not specified: Secondary | ICD-10-CM | POA: Diagnosis not present

## 2022-03-29 LAB — RENAL FUNCTION PANEL
Albumin: 2.7 g/dL — ABNORMAL LOW (ref 3.5–5.0)
Anion gap: 5 (ref 5–15)
BUN: 19 mg/dL (ref 8–23)
CO2: 20 mmol/L — ABNORMAL LOW (ref 22–32)
Calcium: 8 mg/dL — ABNORMAL LOW (ref 8.9–10.3)
Chloride: 114 mmol/L — ABNORMAL HIGH (ref 98–111)
Creatinine, Ser: 0.79 mg/dL (ref 0.44–1.00)
GFR, Estimated: >60 mL/min (ref >60)
Glucose, Bld: 86 mg/dL (ref 70–99)
Phosphorus: 2.9 mg/dL (ref 2.5–4.6)
Potassium: 4 mmol/L (ref 3.5–5.1)
Sodium: 139 mmol/L (ref 135–145)

## 2022-03-29 LAB — PROTIME-INR
INR: 1.4 — ABNORMAL HIGH (ref 0.8–1.2)
Prothrombin Time: 16.9 seconds — ABNORMAL HIGH (ref 11.4–15.2)

## 2022-03-29 LAB — CBC
HCT: 30.8 % — ABNORMAL LOW (ref 36.0–46.0)
Hemoglobin: 10.1 g/dL — ABNORMAL LOW (ref 12.0–15.0)
MCH: 30.5 pg (ref 26.0–34.0)
MCHC: 32.8 g/dL (ref 30.0–36.0)
MCV: 93.1 fL (ref 80.0–100.0)
Platelets: 65 10*3/uL — ABNORMAL LOW (ref 150–400)
RBC: 3.31 MIL/uL — ABNORMAL LOW (ref 3.87–5.11)
RDW: 13.4 % (ref 11.5–15.5)
WBC: 3.4 10*3/uL — ABNORMAL LOW (ref 4.0–10.5)
nRBC: 0 % (ref 0.0–0.2)

## 2022-03-29 LAB — URINE CULTURE: Culture: <10000 — AB

## 2022-03-29 MED ORDER — TAMSULOSIN HCL 0.4 MG PO CAPS
0.4000 mg | ORAL_CAPSULE | Freq: Every day | ORAL | Status: DC
  Administered 2022-03-29: 0.4 mg via ORAL
  Filled 2022-03-29: qty 1

## 2022-03-29 MED ORDER — SODIUM CHLORIDE 0.45 % IV SOLN: INTRAVENOUS | Status: DC

## 2022-03-29 NOTE — Progress Notes (Signed)
?PROGRESS NOTE ?Tasha Roberts  DVV:[id number] DOB: [birth year] DOA: 03/27/2022 ?PCP: Lindell Spar, MD  ? ?Brief Narrative/Hospital Course: ?81 y.o. female with medical history significant of essential hypertension, acquired hypothyroidism, PAF on Eliquis, hyperlipidemia, GERD, liver cirrhosis due to NASH, hypertension admitted with with [name] on 03/27/2022 from [facility], ED with concerns for sepsis secondary to infected kidney stone for further urology evaluation.  Seen by urology and decided on medical management and antibiotics.   ?  ?Subjective: ?Seen and examined this morning.  No more pain on the abdominal groin area no nausea vomiting no fever chills. ?  ?Assessment and Plan: ?Principal Problem: ?  Sepsis secondary to UTI [facility]) ?Active Problems: ?  Liver cirrhosis secondary to NASH [facility]) ?  CAD (coronary artery disease) ?  Thrombocytopenia ([place]) ?  Essential hypertension ?  Acquired hypothyroidism ?  Gastroesophageal reflux disease ?  Mixed hyperlipidemia ?  Left ureteral stone ?  Hypokalemia ?  Nausea & vomiting ?  Metabolic acidosis ? ?Sepsis secondary to UTI/infected kidney stone/left ureteric stone ?History of recurrent kidney stone: ?Hemodynamically stable mild leukopenia, afebrile, blood cultures no growth so far, urine culture pending.  Continue on ceftriaxone 2 g, IV fluids, nephrology following.  Add Flomax.  Continue plan per as per urology team. ? 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  LFTs stable. ?Recent Labs  ?Lab 03/27/22 ?1500 03/28/22 ?6269 03/29/22 ?0421  ?PLT 84* 56* 65*  ?  ?CAD ?Essential hypertension ?Mixed hyperlipidemia: ?No Chest pain, blood pressure stable holding cardizem, lisinopril and HCTZ.   ? ?Mild metabolic acidosis with hyperchloremia change IV fluids to 0.45 NS ? ?Chronic atrial fibrillation rate controlled on Eliquis-Eliquis held on admission and if no plan for procedure we  will resume.  Discussed with the patient since risk benefits of holding Eliquis never had history of stroke but at risk risk and she understands the risk and agreeable for holding ?Acquired hypothyroidism: Continue Synthroid ?Anemia acute drop in hemoglobin likely hemodilution follow-up occult blood monitor ?Gerd-ppi ? ?Hypokalemia-resolved ?Nausea & vomiting-antiemetics, diet as tolerated ? ?DVT prophylaxis: SCDs Start: 03/28/22 0132 ?Code Status:   Code Status: Full Code ?Family Communication: plan of care discussed with patient at bedside. ?Patient status is: inpatient Level of care: Med-Surg  ?Remains inpatient because: Ongoing management of UTI, stone ? ?Patient currently not stable ? ?Dispo: The patient is from: Home ?           Anticipated disposition: Home ? ?Mobility Assessment (last 72 hours)   ? ? Mobility Assessment   ? ? [place] Name 03/29/22 0858 03/28/22 1949 03/28/22 1300  ?  ?  ? Does patient have an order for bedrest or is patient medically unstable No - Continue assessment No - Continue assessment No - Continue assessment    ? What is the highest level of mobility based on the progressive mobility assessment? Level 4 (Walks with assist in room) - Balance while marching in place and cannot step forward and back - Complete Level 3 (Stands with assist) - Balance while standing  and cannot march in place Level 3 (Stands with assist) - Balance while standing  and cannot march in place    ? Is the above level different from baseline mobility prior to current illness? Yes - Recommend PT order Yes - Recommend PT order Yes - Recommend PT order    ? ?  ?  ? ?  ?  ? ?Objective: ?Vitals last  24 hrs: ?Vitals:  ? 03/28/22 1526 03/28/22 2041 03/29/22 0141 03/29/22 0604  ?BP: (!) 113/46 (!) 136/46 (!) 143/54 (!) 123/50  ?Pulse: (!) 58 (!) 51 62 60  ?Resp: '14 14 14 16  '$ ?Temp: 97.8 ?F (36.6 ?C) 98.3 ?F (36.8 ?C) 98 ?F (36.7 ?C) 98.4 ?F (36.9 ?C)  ?TempSrc: Oral Oral Oral Oral  ?SpO2: 100% 96% 100% 100%  ?Weight:       ?Height:      ? ?Weight change:  ? ?Physical Examination: ?General exam: AA,older than stated age, weak appearing. ?HEENT:Oral mucosa moist, Ear/Nose WNL grossly, dentition normal. ?Respiratory system: bilaterally diminished BS, no use of accessory muscle ?Cardiovascular system: S1 & S2 +, No JVD,. ?Gastrointestinal system: Abdomen soft,NT,ND, BS+ ?Nervous System:Alert, awake, moving extremities and grossly nonfocal ?Extremities: LE edema none,distal peripheral pulses palpable.  ?Skin: No rashes,no icterus. ?MSK: Normal muscle bulk,tone, power ? ?Medications reviewed: Scheduled Meds: ? atorvastatin  20 mg Oral Daily  ? diltiazem  120 mg Oral Daily  ? levothyroxine  100 mcg Oral Q0600  ? tamsulosin  0.4 mg Oral QPC supper  ? ursodiol  300 mg Oral BID  ? ?Continuous Infusions: ? sodium chloride    ? cefTRIAXone (ROCEPHIN)  IV 2 g (03/28/22 1722)  ? ? ?  ?Diet Order   ? ?       ?  Diet Heart Room service appropriate? Yes; Fluid consistency: Thin  Diet effective now       ?  ? ?  ?  ? ?  ?  ? ?  ?  ?  ? ? ?Intake/Output Summary (Last 24 hours) at 03/29/2022 1120 ?Last data filed at 03/29/2022 0600 ?Gross per 24 hour  ?Intake 3142.5 ml  ?Output 1100 ml  ?Net 2042.5 ml  ? ?Net IO Since Admission: 2,042.5 mL [03/29/22 1120]  ?Wt Readings from Last 3 Encounters:  ?Apr 14, 2022 60.3 kg  ?03/24/22 60.6 kg  ?01/01/22 64 kg  ?  ? ?Unresulted Labs (From admission, onward)  ? ? None  ? ?  ?Data Reviewed: I have personally reviewed following labs and imaging studies ?CBC: ?Recent Labs  ?Lab 2022/04/14 ?1500 03/28/22 ?1093 03/29/22 ?0421  ?WBC 7.2 4.6 3.4*  ?NEUTROABS 6.3  --   --   ?HGB 11.9* 9.4* 10.1*  ?HCT 34.9* 28.9* 30.8*  ?MCV 89.9 91.5 93.1  ?PLT 84* 56* 65*  ? ?Basic Metabolic Panel: ?Recent Labs  ?Lab 2022/04/14 ?1500 03/28/22 ?2355 03/29/22 ?0421  ?NA 139 140 139  ?K 3.2* 4.3 4.0  ?CL 107 112* 114*  ?CO2 25 23 20*  ?GLUCOSE 118* 89 86  ?BUN 30* 24* 19  ?CREATININE 1.07* 0.91 0.79  ?CALCIUM 9.6 8.3* 8.0*  ?MG  --  1.8  --   ?PHOS   --  2.9 2.9  ? ?GFR: ?Estimated Creatinine Clearance: 47.9 mL/min (by C-G formula based on SCr of 0.79 mg/dL). ?Liver Function Tests: ?Recent Labs  ?Lab 2022-04-14 ?1500 03/28/22 ?7322 03/29/22 ?0421  ?AST 24 20  --   ?ALT 16 14  --   ?ALKPHOS 67 48  --   ?BILITOT 0.9 0.6  --   ?PROT 6.8 5.0*  --   ?ALBUMIN 3.8 2.7* 2.7*  ? ?Recent Labs  ?Lab 2022/04/14 ?1500  ?LIPASE 30  ? ?No results for input(s): AMMONIA in the last 168 hours. ?Coagulation Profile: ?Recent Labs  ?Lab 03/29/22 ?0421  ?INR 1.4*  ? ?BNP (last 3 results) ?No results for input(s): PROBNP in the last 8760 hours. ?HbA1C: ?  No results for input(s): HGBA1C in the last 72 hours. ?CBG: ?No results for input(s): GLUCAP in the last 168 hours. ?Lipid Profile: ?No results for input(s): CHOL, HDL, LDLCALC, TRIG, CHOLHDL, LDLDIRECT in the last 72 hours. ?Thyroid Function Tests: ?No results for input(s): TSH, T4TOTAL, FREET4, T3FREE, THYROIDAB in the last 72 hours. ?Sepsis Labs: ?Recent Labs  ?Lab 03/27/22 ?1500 03/27/22 ?1841  ?LATICACIDVEN 1.5 1.4  ? ? ?Recent Results (from the past 240 hour(s))  ?Culture, blood (routine x 2)     Status: None (Preliminary result)  ? Collection Time: 03/27/22  3:00 PM  ? Specimen: BLOOD  ?Result Value Ref Range Status  ? Specimen Description BLOOD RIGHT ANTECUBITAL  Final  ? Special Requests   Final  ?  BOTTLES DRAWN AEROBIC AND ANAEROBIC Blood Culture adequate volume  ? Culture   Final  ?  NO GROWTH < 24 HOURS ?Performed at [facility], [address] [place], [place] [address] ?  ? Report Status PENDING  Incomplete  ?Resp Panel by RT-PCR (Flu A&B, Covid) Nasopharyngeal Swab     Status: None  ? Collection Time: 03/27/22  3:33 PM  ? Specimen: Nasopharyngeal Swab; Nasopharyngeal(NP) swabs in vial transport medium  ?Result Value Ref Range Status  ? SARS Coronavirus 2 by RT PCR NEGATIVE NEGATIVE Final  ?  Comment: (NOTE) ?SARS-CoV-2 target nucleic acids are NOT DETECTED. ? ?The SARS-CoV-2 RNA is generally detectable in upper  respiratory ?specimens during the acute phase of infection. The lowest ?concentration of SARS-CoV-2 viral copies this assay can detect is ?138 copies/mL. A negative result does not preclude SARS-Cov-2 ?infection an

## 2022-03-29 NOTE — Hospital Course (Addendum)
81 y.o. female with medical history significant of essential hypertension, acquired hypothyroidism, PAF on Eliquis, hyperlipidemia, GERD, liver cirrhosis due to NASH, hypertension admitted with [name] on 03/27/2022 from [facility], ED with concerns for sepsis secondary to infected kidney stone for further urology evaluation.  Seen by urology and decided on medical management and antibiotics.  Pain has completely resolved, likely passed a stone, urine culture no growth.  At this time she is medically stable being discharged home on oral antibiotics with instruction to follow-up with urology as outpatient ?

## 2022-03-29 NOTE — Progress Notes (Signed)
?Subjective: ?Patient reports resolution of her left flank pain. No worsening LUTS. Afebrile. Urine culture pending ? ?Objective: ?Vital signs in last 24 hours: ?Temp:  [98 ?F (36.7 ?C)-98.4 ?F (36.9 ?C)] 98.2 ?F (36.8 ?C) (04/22 1454) ?Pulse Rate:  [51-62] 57 (04/22 1454) ?Resp:  [14-18] 18 (04/22 1454) ?BP: (123-143)/(46-57) 128/57 (04/22 1454) ?SpO2:  [96 %-100 %] 100 % (04/22 1454) ? ?Intake/Output from previous day: ?04/21 0701 - 04/22 0700 ?In: 3142.5 [P.O.:720; I.V.:2222.5; IV Piggyback:200] ?Out: 1100 [Urine:1100] ?Intake/Output this shift: ?No intake/output data recorded. ? ?Physical Exam:  ?General:alert, cooperative, and appears stated age ?GI: not done and soft, non tender, normal bowel sounds, no palpable masses, no organomegaly, no inguinal hernia ?Female genitalia: not done ?Extremities: extremities normal, atraumatic, no cyanosis or edema ? ?Lab Results: ?Recent Labs  ?  03/27/22 ?1500 03/28/22 ?3557 03/29/22 ?0421  ?HGB 11.9* 9.4* 10.1*  ?HCT 34.9* 28.9* 30.8*  ? ?BMET ?Recent Labs  ?  03/28/22 ?3220 03/29/22 ?0421  ?NA 140 139  ?K 4.3 4.0  ?CL 112* 114*  ?CO2 23 20*  ?GLUCOSE 89 86  ?BUN 24* 19  ?CREATININE 0.91 0.79  ?CALCIUM 8.3* 8.0*  ? ?Recent Labs  ?  03/29/22 ?0421  ?INR 1.4*  ? ?No results for input(s): LABURIN in the last 72 hours. ?Results for orders placed or performed during the hospital encounter of 03/27/22  ?Culture, blood (routine x 2)     Status: None (Preliminary result)  ? Collection Time: 03/27/22  3:00 PM  ? Specimen: BLOOD  ?Result Value Ref Range Status  ? Specimen Description BLOOD RIGHT ANTECUBITAL  Final  ? Special Requests   Final  ?  BOTTLES DRAWN AEROBIC AND ANAEROBIC Blood Culture adequate volume  ? Culture   Final  ?  NO GROWTH 2 DAYS ?Performed at [facility], [address], [place], [place] [address] ?  ? Report Status PENDING  Incomplete  ?Resp Panel by RT-PCR (Flu A&B, Covid) Nasopharyngeal Swab     Status: None  ? Collection Time: 03/27/22  3:33 PM  ? Specimen:  Nasopharyngeal Swab; Nasopharyngeal(NP) swabs in vial transport medium  ?Result Value Ref Range Status  ? SARS Coronavirus 2 by RT PCR NEGATIVE NEGATIVE Final  ?  Comment: (NOTE) ?SARS-CoV-2 target nucleic acids are NOT DETECTED. ? ?The SARS-CoV-2 RNA is generally detectable in upper respiratory ?specimens during the acute phase of infection. The lowest ?concentration of SARS-CoV-2 viral copies this assay can detect is ?138 copies/mL. A negative result does not preclude SARS-Cov-2 ?infection and should not be used as the sole basis for treatment or ?other patient management decisions. A negative result may occur with  ?improper specimen collection/handling, submission of specimen other ?than nasopharyngeal swab, presence of viral mutation(s) within the ?areas targeted by this assay, and inadequate number of viral ?copies(<138 copies/mL). A negative result must be combined with ?clinical observations, patient history, and epidemiological ?information. The expected result is Negative. ? ?Fact Sheet for Patients:  ?EntrepreneurPulse.com.au ? ?Fact Sheet for Healthcare Providers:  ?IncredibleEmployment.be ? ?This test is no t yet approved or cleared by the [place] FDA and  ?has been authorized for detection and/or diagnosis of SARS-CoV-2 by ?FDA under an Emergency Use Authorization (EUA). This EUA will remain  ?in effect (meaning this test can be used) for the duration of the ?COVID-19 declaration under Section 564(b)(1) of the Act, 21 ?U.S.C.section 360bbb-3(b)(1), unless the authorization is terminated  ?or revoked sooner.  ? ? ?  ? Influenza A by PCR NEGATIVE  NEGATIVE Final  ? Influenza B by PCR NEGATIVE NEGATIVE Final  ?  Comment: (NOTE) ?The Xpert Xpress SARS-CoV-2/FLU/RSV plus assay is intended as an aid ?in the diagnosis of influenza from Nasopharyngeal swab specimens and ?should not be used as a sole basis for treatment. Nasal washings and ?aspirates are unacceptable for  Xpert Xpress SARS-CoV-2/FLU/RSV ?testing. ? ?Fact Sheet for Patients: ?EntrepreneurPulse.com.au ? ?Fact Sheet for Healthcare Providers: ?IncredibleEmployment.be ? ?This test is not yet approved or cleared by the [place] FDA and ?has been authorized for detection and/or diagnosis of SARS-CoV-2 by ?FDA under an Emergency Use Authorization (EUA). This EUA will remain ?in effect (meaning this test can be used) for the duration of the ?COVID-19 declaration under Section 564(b)(1) of the Act, 21 U.S.C. ?section 360bbb-3(b)(1), unless the authorization is terminated or ?revoked. ? ?Performed at [facility], [address], [place], [place] [address] ?  ?Culture, blood (routine x 2)     Status: None (Preliminary result)  ? Collection Time: 03/27/22  3:38 PM  ? Specimen: BLOOD  ?Result Value Ref Range Status  ? Specimen Description BLOOD BLOOD RIGHT ARM  Final  ? Special Requests   Final  ?  BOTTLES DRAWN AEROBIC AND ANAEROBIC Blood Culture adequate volume  ? Culture   Final  ?  NO GROWTH 2 DAYS ?Performed at [facility], [address], [place], [place] [address] ?  ? Report Status PENDING  Incomplete  ?Urine Culture     Status: Abnormal  ? Collection Time: 03/27/22  5:58 PM  ? Specimen: Urine, Catheterized  ?Result Value Ref Range Status  ? Specimen Description   Final  ?  URINE, CATHETERIZED ?Performed at [facility], [address], [place], [place] [address] ?  ? Special Requests   Final  ?  NONE ?Performed at [facility], [address] [place], [place] [address] ?  ? Culture (A)  Final  ?  <10,000 COLONIES/mL INSIGNIFICANT GROWTH ?Performed at [facility], [place] [address], [place], [place] [address] ?  ? Report Status 03/29/2022 FINAL  Final  ? ? ?Studies/Results: ?No results found. ? ?Assessment/Plan: ?81yo with left ureteral calculus and UTI ? ?Left ureteral calculus: She is currently asymptomatic from her calculus and has likely passed the calculus ?UTI: Please  continue broad spectrum antibiotics pending urine culture ? ? LOS: 2 days  ? ?Nicolette Bang ?03/29/2022, 7:41 PM ? ? ? ?

## 2022-03-30 DIAGNOSIS — A419 Sepsis, unspecified organism: Secondary | ICD-10-CM | POA: Diagnosis not present

## 2022-03-30 DIAGNOSIS — N390 Urinary tract infection, site not specified: Secondary | ICD-10-CM | POA: Diagnosis not present

## 2022-03-30 LAB — CBC
HCT: 32.1 % — ABNORMAL LOW (ref 36.0–46.0)
Hemoglobin: 10.5 g/dL — ABNORMAL LOW (ref 12.0–15.0)
MCH: 30.1 pg (ref 26.0–34.0)
MCHC: 32.7 g/dL (ref 30.0–36.0)
MCV: 92 fL (ref 80.0–100.0)
Platelets: 64 10*3/uL — ABNORMAL LOW (ref 150–400)
RBC: 3.49 MIL/uL — ABNORMAL LOW (ref 3.87–5.11)
RDW: 13.4 % (ref 11.5–15.5)
WBC: 2.3 10*3/uL — ABNORMAL LOW (ref 4.0–10.5)
nRBC: 0 % (ref 0.0–0.2)

## 2022-03-30 LAB — BASIC METABOLIC PANEL
Anion gap: 5 (ref 5–15)
BUN: 15 mg/dL (ref 8–23)
CO2: 22 mmol/L (ref 22–32)
Calcium: 8.5 mg/dL — ABNORMAL LOW (ref 8.9–10.3)
Chloride: 113 mmol/L — ABNORMAL HIGH (ref 98–111)
Creatinine, Ser: 0.79 mg/dL (ref 0.44–1.00)
GFR, Estimated: >60 mL/min (ref >60)
Glucose, Bld: 83 mg/dL (ref 70–99)
Potassium: 3.7 mmol/L (ref 3.5–5.1)
Sodium: 140 mmol/L (ref 135–145)

## 2022-03-30 MED ORDER — APIXABAN 5 MG PO TABS
5.0000 mg | ORAL_TABLET | Freq: Two times a day (BID) | ORAL | Status: DC
  Administered 2022-03-30: 5 mg via ORAL
  Filled 2022-03-30: qty 1

## 2022-03-30 MED ORDER — TAMSULOSIN HCL 0.4 MG PO CAPS: 0.4000 mg | ORAL_CAPSULE | Freq: Every day | ORAL | 0 refills | Status: AC

## 2022-03-30 MED ORDER — CEFADROXIL 500 MG PO CAPS
500.0000 mg | ORAL_CAPSULE | Freq: Two times a day (BID) | ORAL | 0 refills | Status: AC
Start: 2022-03-30 — End: 2022-04-04

## 2022-03-30 MED ORDER — VITAMIN D 25 MCG (1000 UNIT) PO TABS
2000.0000 [IU] | ORAL_TABLET | Freq: Every day | ORAL | Status: DC
  Administered 2022-03-30: 2000 [IU] via ORAL
  Filled 2022-03-30: qty 2

## 2022-03-30 MED ORDER — CHOLECALCIFEROL 50 MCG (2000 UT) PO CAPS: 2000.0000 [IU] | ORAL_CAPSULE | Freq: Every day | ORAL | Status: DC

## 2022-03-30 NOTE — Discharge Summary (Signed)
Physician Discharge Summary  ?Tasha Roberts EYC:[id number] DOB: November 02, 1941 DOA: 03/27/2022 ? ?PCP: Tasha Spar, MD ? ?Admit date: 03/27/2022 ?Discharge date: 03/30/2022 ?Recommendations for Outpatient Follow-up:  ?Follow up with PCP in 1 weeks-call for appointment ?Please obtain BMP/CBC in one week ? ?Discharge Dispo: home ?Discharge Condition: Stable ?Code Status:   Code Status: Full Code ?Diet recommendation:  ?Diet Order   ? ?       ?  Diet Heart Room service appropriate? Yes; Fluid consistency: Thin  Diet effective now       ?  ? ?  ?  ? ?  ?  ? ?Brief/Interim Summary: ?81 y.o. female with medical history significant of essential hypertension, acquired hypothyroidism, PAF on Eliquis, hyperlipidemia, GERD, liver cirrhosis due to NASH, hypertension admitted with with T[facility] hospital on 03/27/2022 from [facility], ED with concerns for sepsis secondary to infected kidney stone for further urology evaluation.  Seen by urology and decided on medical management and antibiotics.  Pain has completely resolved, likely passed a stone, urine culture no growth.  At this time she is medically stable being discharged home on oral antibiotics with instruction to follow-up with urology as outpatient  ? ?Discharge Diagnoses:  ?Principal Problem: ?  Sepsis secondary to UTI [facility]) ?Active Problems: ?  Liver cirrhosis secondary to NASH [facility]) ?  CAD (coronary artery disease) ?  Thrombocytopenia ([facility]) ?  Essential hypertension ?  Acquired hypothyroidism ?  Gastroesophageal reflux disease ?  Mixed hyperlipidemia ?  Left ureteral stone ?  Hypokalemia ?  Nausea & vomiting ?  Metabolic acidosis ? ?Sepsis secondary to UTI/infected kidney stone/left ureteric stone ?History of recurrent kidney stone: ?Hemodynamically stable mild leukopenia, afebrile, blood cultures no growth so far, urine culture insignificant growth, switch to po antibiotics for DC home. And urology fu as outpatient ?  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  LFTs stable ?Recent Labs  ?Lab 03/27/22 ?1500 03/28/22 ?1497 03/29/22 ?0421 03/30/22 ?0263  ?PLT 84* 56* 65* 64*  ?  ?CAD ?Essential hypertension ?Mixed hyperlipidemia: ?No Chest pain, blood pressure stable holding cardizem, lisinopril and HCTZ.   ?  ?Mild metabolic acidosis with hyperchloremia change IV fluids to 0.45 NS ?  ?Chronic atrial fibrillation rate controlled on Eliquis-Eliquis held on admission and if no plan for procedure we will resume.  Discussed with the patient since risk benefits of holding Eliquis never had history of stroke but at risk risk and she understands the risk and agreeable for holding ?Acquired hypothyroidism: Continue Synthroid ?Anemia acute drop in hemoglobin likely hemodilution follow-up occult blood monitor ?Gerd-ppi ?  ?Hypokalemia-resolved ?Nausea & vomiting-antiemetics, diet as tolerated ?Consults: ?Urology ?Subjective: ?Denies any nausea vomiting pain.  Tolerating diet.  She is already dressed up and feels ready for home today  ? ?Discharge Exam: ?Vitals:  ? 03/29/22 2125 03/30/22 0559  ?BP: (!) 153/79 (!) 117/42  ?Pulse: 61 84  ?Resp: 17   ?Temp: 98 ?F (36.7 ?C)   ?SpO2: 100% 100%  ? ?General: Pt is alert, awake, not in acute distress ?Cardiovascular: RRR, S1/S2 +, no rubs, no gallops ?Respiratory: CTA bilaterally, no wheezing, no rhonchi ?Abdominal: Soft, NT, ND, bowel sounds + ?Extremities: no edema, no cyanosis ? ?Discharge Instructions ? ?Discharge Instructions   ? ? Discharge instructions   Complete by: As directed ?  ? Please call call MD or return to ER for similar or worsening recurring problem that brought you to hospital or if any  fever,nausea/vomiting,abdominal pain, uncontrolled pain, chest pain,  shortness of breath or any other alarming symptoms. ? ?Please follow-up your doctor as instructed in a week time and call the office for appointment. ? ?Please avoid alcohol, smoking, or any other  illicit substance and maintain healthy habits including taking your regular medications as prescribed. ? ?You were cared for by a hospitalist during your hospital stay. If you have any questions about your discharge medications or the care you received while you were in the hospital after you are discharged, you can call the unit and ask to speak with the hospitalist on call if the hospitalist that took care of you is not available. ? ?Once you are discharged, your primary care physician will handle any further medical issues. Please note that NO REFILLS for any discharge medications will be authorized once you are discharged, as it is imperative that you return to your primary care physician (or establish a relationship with a primary care physician if you do not have one) for your aftercare needs so that they can reassess your need for medications and monitor your lab values  ? Increase activity slowly   Complete by: As directed ?  ? ?  ? ?Allergies as of 03/30/2022   ? ?   Reactions  ? Dicyclomine Other (See Comments)  ? Dry cough  ? Glucophage [metformin] Diarrhea  ? Losartan Other (See Comments)  ? Heartburn  ? ?  ? ?  ?Medication List  ?  ? ?TAKE these medications   ? ?atorvastatin 20 MG tablet ?Commonly known as: LIPITOR ?TAKE (1) TABLET BY MOUTH ONCE DAILY. ?What changed: See the new instructions. ?  ?cefadroxil 500 MG capsule ?Commonly known as: DURICEF ?Take 1 capsule (500 mg total) by mouth 2 (two) times daily for 5 days. ?  ?Cholecalciferol 50 MCG (2000 UT) Caps ?Take 1 capsule (2,000 Units total) by mouth daily. ?What changed: how much to take ?  ?clobetasol ointment 0.05 % ?Commonly known as: TEMOVATE ?Apply topically at bedtime. ?  ?diltiazem 120 MG 24 hr capsule ?Commonly known as: TIAZAC ?Take 120 mg by mouth daily. ?  ?Eliquis 5 MG Tabs tablet ?Generic drug: apixaban ?TAKE (1) TABLET BY MOUTH TWICE DAILY. ?What changed: See the new instructions. ?  ?famotidine 20 MG tablet ?Commonly known as:  Pepcid ?Take 1 tablet (20 mg total) by mouth 2 (two) times daily as needed for heartburn or indigestion. ?  ?Hibiclens 4 % external liquid ?Generic drug: chlorhexidine ?Apply topically daily as needed. ?  ?levothyroxine 100 MCG tablet ?Commonly known as: SYNTHROID ?TAKE 1 TABLET BY MOUTH ON AN EMPTY STOMACH 30 MINUTES BEFORE BREAKFAST. ?What changed: See the new instructions. ?  ?lisinopril-hydrochlorothiazide 20-25 MG tablet ?Commonly known as: ZESTORETIC ?TAKE (1) TABLET BY MOUTH EACH MORNING. ?What changed: See the new instructions. ?  ?mirtazapine 7.5 MG tablet ?Commonly known as: REMERON ?Take 1 tablet (7.5 mg total) by mouth at bedtime. ?  ?mupirocin ointment 2 % ?Commonly known as: BACTROBAN ?Apply 1 application topically 2 (two) times daily. ?  ?nitroGLYCERIN 0.4 MG SL tablet ?Commonly known as: NITROSTAT ?Place 1 tablet (0.4 mg total) under the tongue every 5 (five) minutes as needed for chest pain. ?  ?tamsulosin 0.4 MG Caps capsule ?Commonly known as: FLOMAX ?Take 1 capsule (0.4 mg total) by mouth daily after supper for 14 days. ?  ?ursodiol 250 MG tablet ?Commonly known as: ACTIGALL ?Take 250 mg by mouth 2 (two) times daily. ?  ?vitamin B-12 1000 MCG tablet ?  Commonly known as: CYANOCOBALAMIN ?Take 1,000 mcg by mouth daily. ?  ? ?  ? ? Follow-up Information   ? ? Tasha Spar, MD Follow up in 1 week(s).   ?Specialty: Internal Medicine ?Contact information: ?[address] ?[place] [address] ?[phone] ? ? ?  ?  ? ? Arnoldo Lenis, MD .   ?Specialty: Cardiology ?Contact information: ?[address] ?[address] ?[place] [address] ?[phone] ? ? ?  ?  ? ? McKenzie, Candee Furbish, MD Follow up in 1 week(s).   ?Specialty: Urology ?Contact information: ?[address]  ?[address] ?[place] [address] ?[phone] ? ? ?  ?  ? ?  ?  ? ?  ? ?Allergies  ?Allergen Reactions  ? Dicyclomine Other (See Comments)  ?  Dry cough  ? Glucophage [Metformin] Diarrhea  ? Losartan Other (See Comments)  ?   Heartburn ?  ? ? ?The results of significant diagnostics from this hospitalization (including imaging, microbiology, ancillary and laboratory) are listed below for reference.   ? ?Microbiology: ?Recent Results (from

## 2022-03-30 NOTE — Progress Notes (Signed)
Assessment unchanged. Pt verbalized understanding of dc instructions through teach back including medications and follow up care. Discharged via wc to front entrance accompanied by NT.  

## 2022-03-31 ENCOUNTER — Telehealth: Payer: Self-pay

## 2022-03-31 ENCOUNTER — Ambulatory Visit (HOSPITAL_COMMUNITY): Payer: Medicare Other

## 2022-03-31 NOTE — Telephone Encounter (Signed)
Transition Care Management Follow-up Telephone Call ?Date of discharge and from where: 03/30/22 Tasha Roberts ?How have you been since you were released from the hospital? Feeling ok  ?Any questions or concerns? No ? ?Items Reviewed: ?Did the pt receive and understand the discharge instructions provided? Yes  ?Medications obtained and verified? Yes  ?Other?  N/a ?Any new allergies since your discharge? No  ?Dietary orders reviewed? No ?Do you have support at home? Yes  ? ?Home Care and Equipment/Supplies: ?Were home health services ordered? no ?If so, what is the name of the agency? N/a  ?Has the agency set up a time to come to the patient's home? not applicable ?Were any new equipment or medical supplies ordered?  No ?What is the name of the medical supply agency? N/a ?Were you able to get the supplies/equipment? not applicable ?Do you have any questions related to the use of the equipment or supplies? No ? ?Functional Questionnaire: (I = Independent and D = Dependent) ?ADLs: I ? ?Bathing/Dressing- I ? ?Meal Prep- I ? ?Eating- I ? ?Maintaining continence- I ? ?Transferring/Ambulation- I ? ?Managing Meds- I ? ?Follow up appointments reviewed: ? ?PCP Hospital f/u appt confirmed? Yes  Scheduled to see Posey Pronto  on 04/07/22 @ 1:20. ?[facility] f/u appt confirmed? Yes  Appointment to be scheduled with Urology. ?Are transportation arrangements needed? No  ?If their condition worsens, is the pt aware to call PCP or go to the Emergency Dept.? Yes ?Was the patient provided with contact information for the PCP's office or ED? Yes ?Was to pt encouraged to call back with questions or concerns? Yes  ?

## 2022-04-01 ENCOUNTER — Encounter: Payer: Self-pay | Admitting: Cardiology

## 2022-04-01 ENCOUNTER — Ambulatory Visit (INDEPENDENT_AMBULATORY_CARE_PROVIDER_SITE_OTHER): Payer: Medicare Other | Admitting: Cardiology

## 2022-04-01 VITALS — BP 102/60 | HR 72 | Ht 63.0 in | Wt 139.2 lb

## 2022-04-01 DIAGNOSIS — E782 Mixed hyperlipidemia: Secondary | ICD-10-CM

## 2022-04-01 DIAGNOSIS — I10 Essential (primary) hypertension: Secondary | ICD-10-CM

## 2022-04-01 DIAGNOSIS — I480 Paroxysmal atrial fibrillation: Secondary | ICD-10-CM

## 2022-04-01 NOTE — Progress Notes (Signed)
? ? ? ?Clinical Summary ?Tasha Roberts is a 81 y.o.female seen today for follow up of the following medical problems.  ? ?Previously followed at [facility] cardiology ?  ?1. PAF ?- no recent palpitations.  ?- compliant on eliquis ?- chronic anemia, thrombocytopenia, liver disease, chronic heavy bruising. Despite has been able to remain on eliquis, most recent platelets >50k. Despite liver disease has not had evidence of varcies. Long history of anemia followed by hematology, intermittent iron infusions ? ? ? ?  ?2. Coronary calcifications ?-noted on prior CT imaging at [facility] ?- 08/2019 nuclear stress no ischemia ?  ?3. Thrombocytopenia ?- followed by heme, from notes thought seconcdary to NASH liver disease ?- has been >50k, remains on eliquis ? ?  ?  ?  ?4. HTN ?- compliant with meds ?  ?5. Hyperlipidemia ?04/2020 TC 180 HDL 58 LDL 92 TG 148 ?- she is on statin ? ? ?6. Sepsis secondary to UTI ?- admission 03/2022 with sepsis, UTI ? ? ?7 LIver cirrhosis/NASH ?-  ? ?8. Anemia ?- followed by heme ?- intermittent iron infusions.  ?- Most recent EGD/colonoscopy (03/11/2022 at [facility]) showed portal hypertensive gastropathy with normal esophagus and duodenum.  Colonoscopy unremarkable. ?- Work-up for other sources of anemia was negative - normal SPEP, normal light chain ratio, normal haptoglobin, appropriately elevated erythropoietin ?- Most recent IV iron (Venofer 300 mg x 3, last on 01/17/2022) ?Past Medical History:  ?Diagnosis Date  ? Anemia due to blood loss   ? iron infusion's last one 03-25-2021 (pt bleeding post vulvar bx 02-11-2021, hospiatal admission 02-21-2021)  ? Anticoagulant long-term use   ? eliquis--- managed by pcp   ? Arthritis   ? hands  ? Bronchiectasis ([place])   ? pulmonology --- dr Halford Chessman   (04-01-2021  pt stated no supplemental oxygen use since 01/ 2022 and no inhaler use,  stated checks O2 at home during the day 98-99%)  ? Cancer [facility]) 2013  ? Lumpectomy left breast  ? Cataract 2015  ? Chronic constipation    ? Clotting disorder ([place])   ? On eliquis  ? Coronary artery calcification   ? per lexiscan 08-17-2019 result in care everywhere  ? Depression, major, single episode, moderate ([place]) 12/05/2021  ? DM II (diabetes mellitus, type II), controlled ([place]) 08/27/2005  ? DOE (dyspnea on exertion)   ? 04-01-2021  per pt only going up stairs   ? Gastroesophageal reflux disease 12/05/2021  ? History of diabetes mellitus, type II   ? 04-01-2021  per pt no issues after weight loss, last taken medication approx 2017  ? History of Graves' disease 2017  ? s/p RAI  ? History of left breast cancer   ? dx 2013,  s/p left partial mastecotmy w/ node dissection 09-15-2012, low grade DCIS, no chemo, completed radiation 02/ 2014,  per pt no recurrence  ? History of respiratory syncytial virus (RSV) infection 09/2020  ? w/ hypoxia  , hospital admission 10/ 2021  and acute exacerbation bronchiectasis;  follow up post hospital pulmonogy dr sood  note in epic  ? Hyperlipidemia   ? Hypertension   ? followed by pcp   (pt had lexiscan in care everywhere dated 08-17-2019 no ischemia , non-obstructive extensive calcification, ef 69%)  ? Hypothyroidism   ? Hypothyroidism, postradioiodine therapy   ? followed by pcp  ? Liver cirrhosis secondary to NASH (nonalcoholic steatohepatitis) ([place])   ? followed by dr Merrilee Jansky ([place] [facility])---- dx 2004,  compensated ,  last  liver bx (03/ 2015) fibrosis stage 3;  moderate portal hypertensive gastrophy  ? PAF (paroxysmal atrial fibrillation) [facility]) previous cardiologist lov note w/ Suann Larry PA ([facility] in [place]) dated 01-24-2020 in care everywhere;  (04-01-2021 per pt has appt w/ new cardiologist , dr j. Mitzi Lilja 04-30-2021)  ? first dx 2020--  had event monitor/ stress test/ echo all results in care everywhere (monitor 08-03-2019 short runs AFib conversion pause <2 secconds, rate controlled;  echo 12-14-2019 ef 60%, mild concentric LVH, G2DD, RSVP 50.52mHg)  ? Personal history of  radiation therapy 2013  ? left breast cancer,  completed 02/ 2014  ? PONV (postoperative nausea and vomiting)   ? Sarcoidosis, lung ([facility] pulmonology--- dr sHalford Chessman ? 04-01-2021  per pt dx age [age]s, no issue since  ? Thrombocytopenia ([place]   ? hematology/ oncology--- dr s. kDelton Coombes ? VIN III (vulvar intraepithelial neoplasia III)   ? ? ? ?Allergies  ?Allergen Reactions  ? Dicyclomine Other (See Comments)  ?  Dry cough  ? Glucophage [Metformin] Diarrhea  ? Losartan Other (See Comments)  ?  Heartburn ?  ? ? ? ?Current Outpatient Medications  ?Medication Sig Dispense Refill  ? apixaban (ELIQUIS) 5 MG TABS tablet TAKE (1) TABLET BY MOUTH TWICE DAILY. (Patient taking differently: Take 5 mg by mouth 2 (two) times daily.) 180 tablet 1  ? atorvastatin (LIPITOR) 20 MG tablet TAKE (1) TABLET BY MOUTH ONCE DAILY. (Patient taking differently: Take 20 mg by mouth daily.) 90 tablet 1  ? cefadroxil (DURICEF) 500 MG capsule Take 1 capsule (500 mg total) by mouth 2 (two) times daily for 5 days. 10 capsule 0  ? Cholecalciferol 50 MCG (2000 UT) CAPS Take 1 capsule (2,000 Units total) by mouth daily. 30 capsule   ? clobetasol ointment (TEMOVATE) 0.05 % Apply topically at bedtime.    ? diltiazem (TIAZAC) 120 MG 24 hr capsule Take 120 mg by mouth daily.    ? levothyroxine (SYNTHROID) 100 MCG tablet TAKE 1 TABLET BY MOUTH ON AN EMPTY STOMACH 30 MINUTES BEFORE BREAKFAST. (Patient taking differently: Take 100 mcg by mouth daily before breakfast.) 90 tablet 0  ? lisinopril-hydrochlorothiazide (ZESTORETIC) 20-25 MG tablet TAKE (1) TABLET BY MOUTH EACH MORNING. (Patient taking differently: Take 1 tablet by mouth daily.) 90 tablet 0  ? mupirocin ointment (BACTROBAN) 2 % Apply 1 application topically 2 (two) times daily. 22 g 0  ? nitroGLYCERIN (NITROSTAT) 0.4 MG SL tablet Place 1 tablet (0.4 mg total) under the tongue every 5 (five) minutes as needed for chest pain. 10 tablet 0  ? tamsulosin (FLOMAX) 0.4 MG CAPS capsule Take 1 capsule (0.4 mg  total) by mouth daily after supper for 14 days. 14 capsule 0  ? ursodiol (ACTIGALL) 250 MG tablet Take 250 mg by mouth 2 (two) times daily.    ? vitamin B-12 (CYANOCOBALAMIN) 500 MCG tablet Take 1,000 mcg by mouth daily.    ? chlorhexidine (HIBICLENS) 4 % external liquid Apply topically daily as needed. (Patient not taking: Reported on 03/27/2022) 120 mL 0  ? famotidine (PEPCID) 20 MG tablet Take 1 tablet (20 mg total) by mouth 2 (two) times daily as needed for heartburn or indigestion. (Patient not taking: Reported on 04/01/2022) 60 tablet 2  ? mirtazapine (REMERON) 7.5 MG tablet Take 1 tablet (7.5 mg total) by mouth at bedtime. (Patient not taking: Reported on 03/27/2022) 30 tablet 2  ? ?No current facility-administered medications for this visit.  ? ? ? ?Past Surgical  History:  ?Procedure Laterality Date  ? ABDOMINAL HYSTERECTOMY  1989  ? W/  UNILATERAL SALPINGOOPHORECTOMY  ? APPENDECTOMY  1989  ? BREAST SURGERY  09/15/2012  ? CATARACT EXTRACTION W/ INTRAOCULAR LENS  IMPLANT, BILATERAL  2016  ? CHOLECYSTECTOMY OPEN  1987  ? CO2 LASER APPLICATION N/A 75/17/0017  ? Procedure: CO2 LASER APPLICATION OF THE VULVA;  Surgeon: Everitt Amber, MD;  Location: [facility];  Service: Gynecology;  Laterality: N/A;  ? COLONOSCOPY WITH ESOPHAGOGASTRODUODENOSCOPY (EGD)  last one 08/ 2018  '@[facility]   ? EYE SURGERY  2015  ? Cataract removed both eyes  ? LUMBAR LAMINECTOMY  2009  ? PARTIAL MASTECTOMY WITH AXILLARY SENTINEL LYMPH NODE BIOPSY Left 09-15-2012   [facility]  ? TUBAL LIGATION  08/08/1969  ? ? ? ?Allergies  ?Allergen Reactions  ? Dicyclomine Other (See Comments)  ?  Dry cough  ? Glucophage [Metformin] Diarrhea  ? Losartan Other (See Comments)  ?  Heartburn ?  ? ? ? ? ?Family History  ?Problem Relation Age of Onset  ? Hypertension Mother   ? Dementia Mother   ? Vision loss Mother   ? Heart disease Father   ? Hearing loss Father   ? Stroke Maternal Grandfather   ? Heart disease Brother   ? ? ? ?Social History ?Tasha Roberts  reports that she has never smoked. She has never used smokeless tobacco. ?Tasha Roberts reports that she does not currently use alcohol. ? ? ?Review of Systems ?CONSTITUTIONAL: No weight loss, fever, chills, wea

## 2022-04-01 NOTE — Patient Instructions (Signed)
Medication Instructions:  ?Continue all current medications. ? ?Labwork: ?none ? ?Testing/Procedures: ?none ? ?Follow-Up: ?6 months  ? ?Any Other Special Instructions Will Be Listed Below (If Applicable). ?You have been referred to:  [facility]  ? ?If you need a refill on your cardiac medications before your next appointment, please call your pharmacy. ? ?

## 2022-04-02 LAB — CULTURE, BLOOD (ROUTINE X 2)
Culture: NO GROWTH
Culture: NO GROWTH
Special Requests: ADEQUATE
Special Requests: ADEQUATE

## 2022-04-03 ENCOUNTER — Inpatient Hospital Stay: Payer: Medicare Other | Admitting: Family Medicine

## 2022-04-07 ENCOUNTER — Ambulatory Visit (INDEPENDENT_AMBULATORY_CARE_PROVIDER_SITE_OTHER): Payer: Medicare Other | Admitting: Internal Medicine

## 2022-04-07 ENCOUNTER — Encounter: Payer: Self-pay | Admitting: Internal Medicine

## 2022-04-07 VITALS — BP 112/64 | HR 60 | Resp 18 | Ht 63.0 in | Wt 134.6 lb

## 2022-04-07 DIAGNOSIS — N201 Calculus of ureter: Secondary | ICD-10-CM

## 2022-04-07 DIAGNOSIS — N390 Urinary tract infection, site not specified: Secondary | ICD-10-CM

## 2022-04-07 DIAGNOSIS — R109 Unspecified abdominal pain: Secondary | ICD-10-CM

## 2022-04-07 DIAGNOSIS — E118 Type 2 diabetes mellitus with unspecified complications: Secondary | ICD-10-CM

## 2022-04-07 DIAGNOSIS — Z09 Encounter for follow-up examination after completed treatment for conditions other than malignant neoplasm: Secondary | ICD-10-CM | POA: Insufficient documentation

## 2022-04-07 DIAGNOSIS — A419 Sepsis, unspecified organism: Secondary | ICD-10-CM

## 2022-04-07 MED ORDER — HYOSCYAMINE SULFATE 0.125 MG PO TABS: 0.1250 mg | ORAL_TABLET | Freq: Four times a day (QID) | ORAL | 0 refills | Status: DC | PRN

## 2022-04-07 NOTE — Assessment & Plan Note (Signed)
Likely ureteral stone now ?Did not require urologic intervention ?

## 2022-04-07 NOTE — Assessment & Plan Note (Signed)
Recently had colonoscopy -was benign ?Her abdominal cramping could be due to IBS, improves with BM ?Levsin as needed for now, has intolerance to Bentyl ?

## 2022-04-07 NOTE — Progress Notes (Signed)
? ?Established Patient Office Visit ? ?Subjective:  ?Patient ID: Tasha Roberts, female    DOB: 07-04-1941  Age: 81 y.o. MRN: [id number] ? ?CC:  ?Chief Complaint  ?Patient presents with  ? Transitions Of Care  ?  Pt discharged 03-30-22 was kidney stone pressing against bladder causing UTI does have swelling in left leg   ? ? ?HPI ?Tasha Roberts is a 81 y.o. female with past medical history of HTN, paroxysmal A Fib, NASH, hypothyroidism, breast ca s/p left mastectomy, iron deficiency anemia, thrombocytopenia, osteoporosis and chronic loose BM who presents for f/u after recent hospitalization for left ureteric stone. ? ?She was admitted for left left ureteric stone and sepsis secondary to UTI, which was managed medically with IV fluids and antibiotics. She likely passed urinary stone and did not require urologic intervention. She was discharged on 04/24. She currently denies any dysuria, hematuria, fever or chills. She has bruising over right UE and b/l LE. RUE bruising is from venepunctures. She has chronic LE bruising from Eliquis use. She is going to be evaluated for Watchman device. ? ?She still c/o abdominal cramping, but denies any diarrhea, melena or constipation. She had colonoscopy at [facility] and reports that it was benign. ? ? ?Past Medical History:  ?Diagnosis Date  ? Anemia due to blood loss   ? iron infusion's last one 03-25-2021 (pt bleeding post vulvar bx 02-11-2021, hospiatal admission 02-21-2021)  ? Anticoagulant long-term use   ? eliquis--- managed by pcp   ? Arthritis   ? hands  ? Bronchiectasis ([place])   ? pulmonology --- dr Halford Chessman   (04-01-2021  pt stated no supplemental oxygen use since 01/ 2022 and no inhaler use,  stated checks O2 at home during the day 98-99%)  ? Cancer [facility]) 2013  ? Lumpectomy left breast  ? Cataract 2015  ? Chronic constipation   ? Clotting disorder ([place])   ? On eliquis  ? Coronary artery calcification   ? per lexiscan 08-17-2019 result in care everywhere  ? Depression, major,  single episode, moderate ([place]) 12/05/2021  ? DM II (diabetes mellitus, type II), controlled ([place]) 08/27/2005  ? DOE (dyspnea on exertion)   ? 04-01-2021  per pt only going up stairs   ? Gastroesophageal reflux disease 12/05/2021  ? History of diabetes mellitus, type II   ? 04-01-2021  per pt no issues after weight loss, last taken medication approx 2017  ? History of Graves' disease 2017  ? s/p RAI  ? History of left breast cancer   ? dx 2013,  s/p left partial mastecotmy w/ node dissection 09-15-2012, low grade DCIS, no chemo, completed radiation 02/ 2014,  per pt no recurrence  ? History of respiratory syncytial virus (RSV) infection 09/2020  ? w/ hypoxia  , hospital admission 10/ 2021  and acute exacerbation bronchiectasis;  follow up post hospital pulmonogy dr sood  note in epic  ? Hyperlipidemia   ? Hypertension   ? followed by pcp   (pt had lexiscan in care everywhere dated 08-17-2019 no ischemia , non-obstructive extensive calcification, ef 69%)  ? Hypothyroidism   ? Hypothyroidism, postradioiodine therapy   ? followed by pcp  ? Liver cirrhosis secondary to NASH (nonalcoholic steatohepatitis) ([place])   ? followed by dr Merrilee Jansky ([facility])---- dx 2004,  compensated ,  last liver bx (03/ 2015) fibrosis stage 3;  moderate portal hypertensive gastrophy  ? PAF (paroxysmal atrial fibrillation) ([place]) previous cardiologist lov note w/ Suann Larry PA ([facility]  in [place]) dated 01-24-2020 in care everywhere;  (04-01-2021 per pt has appt w/ new cardiologist , dr j. branch 04-30-2021)  ? first dx 2020--  had event monitor/ stress test/ echo all results in care everywhere (monitor 08-03-2019 short runs AFib conversion pause <2 secconds, rate controlled;  echo 12-14-2019 ef 60%, mild concentric LVH, G2DD, RSVP 50.74mHg)  ? Personal history of radiation therapy 2013  ? left breast cancer,  completed 02/ 2014  ? PONV (postoperative nausea and vomiting)   ? Sarcoidosis, lung ([facility] pulmonology---  dr sHalford Chessman ? 04-01-2021  per pt dx age [age]s, no issue since  ? [place]rombocytopenia ([place]   ? hematology/ oncology--- dr s. kDelton Coombes ? VIN III (vulvar intraepithelial neoplasia III)   ? ? ?Past Surgical History:  ?Procedure Laterality Date  ? ABDOMINAL HYSTERECTOMY  1989  ? W/  UNILATERAL SALPINGOOPHORECTOMY  ? APPENDECTOMY  1989  ? BREAST SURGERY  09/15/2012  ? CATARACT EXTRACTION W/ INTRAOCULAR LENS  IMPLANT, BILATERAL  2016  ? CHOLECYSTECTOMY OPEN  1987  ? CO2 LASER APPLICATION N/A 086/76/7209 ? Procedure: CO2 LASER APPLICATION OF THE VULVA;  Surgeon: REveritt Amber MD;  [facility]  Service: Gynecology;  Laterality: N/A;  ? COLONOSCOPY WITH ESOPHAGOGASTRODUODENOSCOPY (EGD)  last one 08/ 2018  '@[facility]'$   ? EYE SURGERY  2015  ? Cataract removed both eyes  ? LUMBAR LAMINECTOMY  2009  ? PARTIAL MASTECTOMY WITH AXILLARY SENTINEL LYMPH [facility]DE BIOPSY [facility] ? TUBAL LIGATION  08/08/1969  ? ? ?Family History  ?Problem Relation Age of Onset  ? Hypertension Mother   ? Dementia Mother   ? Vision loss Mother   ? Heart disease Father   ? Hearing loss Father   ? Stroke Maternal Grandfather   ? Heart disease Brother   ? ? ?Social History  ? ?Socioeconomic History  ? Marital status: Widowed  ?  Spouse name: Not on file  ? Number of children: 3  ? Years of education: Not on file  ? Highest education level: Not on file  ?Occupational History  ? Occupation: retired  ?Tobacco Use  ? Smoking status: Never  ? Smokeless tobacco: Never  ?Vaping Use  ? Vaping Use: Never used  ?Substance and Sexual Activity  ? Alcohol use: Not Currently  ? Drug use: Never  ? Sexual activity: Not Currently  ?  Birth control/protection: Post-menopausal  ?Other Topics Concern  ? Not on file  ?Social History Narrative  ? Not on file  ? ?Social Determinants of Health  ? ?Financial Resource Strain: Low Risk   ? Difficulty of Paying Living Expenses: Not hard at all  ?Food Insecurity: No Food Insecurity  ? Worried About RPaediatric nursein the Last Year: Never true  ? Ran Out of Food in the Last Year: Never true  ?Transportation Needs: No Transportation Needs  ? Lack of Transportation (Medical): No  ? Lack of Transportation (Non-Medical): No  ?Physical Activity: Insufficiently Active  ? Days of Exercise per Week: 3 days  ? Minutes of Exercise per Session: 30 min  ?Stress: No Stress Concern Present  ? Feeling of Stress : Not at all  ?Social Connections: Moderately Isolated  ? Frequency of Communication with Friends and Family: More than three times a week  ? Frequency of Social Gatherings with Friends and Family: More than three times a week  ? Attends Religious Services: More than 4 times per year  ?  Active Member of Clubs or Organizations: No  ? Attends Archivist Meetings: Never  ? Marital Status: Widowed  ?Intimate Partner Violence: Not At Risk  ? Fear of Current or Ex-Partner: No  ? Emotionally Abused: No  ? Physically Abused: No  ? Sexually Abused: No  ? ? ?Outpatient Medications Prior to Visit  ?Medication Sig Dispense Refill  ? apixaban (ELIQUIS) 5 MG TABS tablet TAKE (1) TABLET BY MOUTH TWICE DAILY. (Patient taking differently: Take 5 mg by mouth 2 (two) times daily.) 180 tablet 1  ? atorvastatin (LIPITOR) 20 MG tablet TAKE (1) TABLET BY MOUTH ONCE DAILY. (Patient taking differently: Take 20 mg by mouth daily.) 90 tablet 1  ? chlorhexidine (HIBICLENS) 4 % external liquid Apply topically daily as needed. 120 mL 0  ? Cholecalciferol 50 MCG (2000 UT) CAPS Take 1 capsule (2,000 Units total) by mouth daily. 30 capsule   ? clobetasol ointment (TEMOVATE) 0.05 % Apply topically at bedtime.    ? diltiazem (TIAZAC) 120 MG 24 hr capsule Take 120 mg by mouth daily.    ? famotidine (PEPCID) 20 MG tablet Take 1 tablet (20 mg total) by mouth 2 (two) times daily as needed for heartburn or indigestion. 60 tablet 2  ? levothyroxine (SYNTHROID) 100 MCG tablet TAKE 1 TABLET BY MOUTH ON AN EMPTY STOMACH 30 MINUTES BEFORE BREAKFAST.  (Patient taking differently: Take 100 mcg by mouth daily before breakfast.) 90 tablet 0  ? lisinopril-hydrochlorothiazide (ZESTORETIC) 20-25 MG tablet TAKE (1) TABLET BY MOUTH EACH MORNING. (Patient taking differe

## 2022-04-07 NOTE — Patient Instructions (Signed)
Please take Levsin as prescribed for abdominal cramping. ? ?Please continue to take other medications as prescribed. ?

## 2022-04-07 NOTE — Assessment & Plan Note (Signed)
Hospital chart reviewed, including discharge summary ?Left ureteral stone and sepsis secondary to UTI -resolved now ?Planned to follow-up with urology in outpatient setting ?

## 2022-04-07 NOTE — Assessment & Plan Note (Signed)
Lab Results  ?Component Value Date  ? HGBA1C 5.6 12/05/2021  ? HGBA1C 5.6 12/05/2021  ? ? ?Diet controlled ?Advised to follow diabetic diet ?On statin and ACEi ?F/u CMP and lipid panel ?Diabetic eye exam: Advised to follow up with Ophthalmology for diabetic eye exam ?

## 2022-04-07 NOTE — Assessment & Plan Note (Signed)
Blood and Urine culture unremarkable ?S/p IV and later oral abx ? ?

## 2022-04-08 LAB — CBC WITH DIFFERENTIAL/PLATELET
Basophils Absolute: 0 10*3/uL (ref 0.0–0.2)
Basos: 1 %
EOS (ABSOLUTE): 0.2 10*3/uL (ref 0.0–0.4)
Eos: 3 %
Hematocrit: 36.4 % (ref 34.0–46.6)
Hemoglobin: 12.6 g/dL (ref 11.1–15.9)
Immature Grans (Abs): 0 10*3/uL (ref 0.0–0.1)
Immature Granulocytes: 0 %
Lymphocytes Absolute: 1.3 10*3/uL (ref 0.7–3.1)
Lymphs: 19 %
MCH: 30.7 pg (ref 26.6–33.0)
MCHC: 34.6 g/dL (ref 31.5–35.7)
MCV: 89 fL (ref 79–97)
Monocytes Absolute: 0.5 10*3/uL (ref 0.1–0.9)
Monocytes: 7 %
Neutrophils Absolute: 4.7 10*3/uL (ref 1.4–7.0)
Neutrophils: 70 %
Platelets: 171 10*3/uL (ref 150–450)
RBC: 4.11 x10E6/uL (ref 3.77–5.28)
RDW: 13.6 % (ref 11.7–15.4)
WBC: 6.7 10*3/uL (ref 3.4–10.8)

## 2022-04-08 LAB — BASIC METABOLIC PANEL
BUN/Creatinine Ratio: 23 (ref 12–28)
BUN: 23 mg/dL (ref 8–27)
CO2: 22 mmol/L (ref 20–29)
Calcium: 9.2 mg/dL (ref 8.7–10.3)
Chloride: 105 mmol/L (ref 96–106)
Creatinine, Ser: 0.99 mg/dL (ref 0.57–1.00)
Glucose: 101 mg/dL — ABNORMAL HIGH (ref 70–99)
Potassium: 3.7 mmol/L (ref 3.5–5.2)
Sodium: 145 mmol/L — ABNORMAL HIGH (ref 134–144)
eGFR: 58 mL/min/{1.73_m2} — ABNORMAL LOW (ref >59)

## 2022-04-08 LAB — HEMOGLOBIN A1C
Est. average glucose Bld gHb Est-mCnc: 108 mg/dL
Hgb A1c MFr Bld: 5.4 % (ref 4.8–5.6)

## 2022-04-09 LAB — MICROALBUMIN / CREATININE URINE RATIO
Creatinine, Urine: 156.5 mg/dL
Microalb/Creat Ratio: 14 mg/g creat (ref 0–29)
Microalbumin, Urine: 21.7 ug/mL

## 2022-04-16 ENCOUNTER — Ambulatory Visit (INDEPENDENT_AMBULATORY_CARE_PROVIDER_SITE_OTHER): Payer: Medicare Other | Admitting: Urology

## 2022-04-16 ENCOUNTER — Encounter: Payer: Self-pay | Admitting: Urology

## 2022-04-16 VITALS — BP 106/62 | HR 59

## 2022-04-16 DIAGNOSIS — N201 Calculus of ureter: Secondary | ICD-10-CM | POA: Diagnosis not present

## 2022-04-16 DIAGNOSIS — R32 Unspecified urinary incontinence: Secondary | ICD-10-CM

## 2022-04-16 LAB — MICROSCOPIC EXAMINATION: RBC, Urine: NONE SEEN /hpf (ref 0–2)

## 2022-04-16 LAB — URINALYSIS, ROUTINE W REFLEX MICROSCOPIC
Bilirubin, UA: NEGATIVE
Glucose, UA: NEGATIVE
Ketones, UA: NEGATIVE
Nitrite, UA: NEGATIVE
Protein,UA: NEGATIVE
Specific Gravity, UA: 1.02 (ref 1.005–1.030)
Urobilinogen, Ur: 0.2 mg/dL (ref 0.2–1.0)
pH, UA: 5.5 (ref 5.0–7.5)

## 2022-04-16 LAB — BLADDER SCAN AMB NON-IMAGING: Scan Result: 0

## 2022-04-16 MED ORDER — MIRABEGRON ER 25 MG PO TB24: 25.0000 mg | ORAL_TABLET | Freq: Every day | ORAL | 0 refills | Status: DC

## 2022-04-16 NOTE — Progress Notes (Signed)
post void residual=0 ?

## 2022-04-16 NOTE — Progress Notes (Signed)
? ?04/16/2022 ?2:51 PM  ? ?Tasha Roberts ?Nov 02, 1941 ?[id number] ? ?Referring provider: Lindell Spar, MD ?[address] ?[place],  [place] [address] ? ?Urinary incontinence ? ? ?HPI: ?Tasha Roberts is a 81yo here for evaluation of urinary incontinence. For the past 3 years she has noted worsening urinary urgency, urge incontinence and nocturia 3x. She was given levsin for irritable bowel which significantly improved her urinary urgency and resolved her urge incontinence. No SUI. No straining to urinate. No numbness/tingling in fingers or toes ?No stone events since discharge from the hospital. No flank pain ? ? ?PMH: ?Past Medical History:  ?Diagnosis Date  ? Anemia due to blood loss   ? iron infusion's last one 03-25-2021 (pt bleeding post vulvar bx 02-11-2021, hospiatal admission 02-21-2021)  ? Anticoagulant long-term use   ? eliquis--- managed by pcp   ? Arthritis   ? hands  ? Bronchiectasis ([place])   ? pulmonology --- dr Halford Chessman   (04-01-2021  pt stated no supplemental oxygen use since 01/ 2022 and no inhaler use,  stated checks O2 at home during the day 98-99%)  ? Cancer [facility]) 2013  ? Lumpectomy left breast  ? Cataract 2015  ? Chronic constipation   ? Clotting disorder ([place])   ? On eliquis  ? Coronary artery calcification   ? per lexiscan 08-17-2019 result in care everywhere  ? Depression, major, single episode, moderate ([place]) 12/05/2021  ? DM II (diabetes mellitus, type II), controlled ([place]) 08/27/2005  ? DOE (dyspnea on exertion)   ? 04-01-2021  per pt only going up stairs   ? Gastroesophageal reflux disease 12/05/2021  ? History of diabetes mellitus, type II   ? 04-01-2021  per pt no issues after weight loss, last taken medication approx 2017  ? History of Graves' disease 2017  ? s/p RAI  ? History of left breast cancer   ? dx 2013,  s/p left partial mastecotmy w/ node dissection 09-15-2012, low grade DCIS, no chemo, completed radiation 02/ 2014,  per pt no recurrence  ? History of respiratory syncytial virus (RSV)  infection 09/2020  ? w/ hypoxia  , hospital admission 10/ 2021  and acute exacerbation bronchiectasis;  follow up post hospital pulmonogy dr sood  note in epic  ? Hyperlipidemia   ? Hypertension   ? followed by pcp   (pt had lexiscan in care everywhere dated 08-17-2019 no ischemia , non-obstructive extensive calcification, ef 69%)  ? Hypothyroidism   ? Hypothyroidism, postradioiodine therapy   ? followed by pcp  ? Liver cirrhosis secondary to NASH (nonalcoholic steatohepatitis) ([place])   ? followed by dr Merrilee Jansky ([facility])---- dx 2004,  compensated ,  last liver bx (03/ 2015) fibrosis stage 3;  moderate portal hypertensive gastrophy  ? PAF (paroxysmal atrial fibrillation) [facility]) previous cardiologist lov note w/ Suann Larry PA ([facility] in [place]) dated 01-24-2020 in care everywhere;  (04-01-2021 per pt has appt w/ new cardiologist , dr j. branch 04-30-2021)  ? first dx 2020--  had event monitor/ stress test/ echo all results in care everywhere (monitor 08-03-2019 short runs AFib conversion pause <2 secconds, rate controlled;  echo 12-14-2019 ef 60%, mild concentric LVH, G2DD, RSVP 50.28mHg)  ? Personal history of radiation therapy 2013  ? left breast cancer,  completed 02/ 2014  ? PONV (postoperative nausea and vomiting)   ? Sarcoidosis, lung ([facility] pulmonology--- dr sHalford Chessman ? 04-01-2021  per pt dx age [age]s, no issue since  ? Thrombocytopenia (  [facility])   ? hematology/ oncology--- dr s. Delton Coombes  ? VIN III (vulvar intraepithelial neoplasia III)   ? ? ?Surgical History: ?Past Surgical History:  ?Procedure Laterality Date  ? ABDOMINAL HYSTERECTOMY  1989  ? W/  UNILATERAL SALPINGOOPHORECTOMY  ? APPENDECTOMY  1989  ? BREAST SURGERY  09/15/2012  ? CATARACT EXTRACTION W/ INTRAOCULAR LENS  IMPLANT, BILATERAL  2016  ? CHOLECYSTECTOMY OPEN  1987  ? CO2 LASER APPLICATION N/A 16/09/9603  ? Procedure: CO2 LASER APPLICATION OF THE VULVA;  Surgeon: Everitt Amber, MD;  Location: [facility];   Service: Gynecology;  Laterality: N/A;  ? COLONOSCOPY WITH ESOPHAGOGASTRODUODENOSCOPY (EGD)  last one 08/ 2018  '@[facility]'$   ? EYE SURGERY  2015  ? Cataract removed both eyes  ? LUMBAR LAMINECTOMY  2009  ? PARTIAL MASTECTOMY WITH AXILLARY SENTINEL LYMPH NODE BIOPSY Left 09-15-2012   [facility]  ? TUBAL LIGATION  08/08/1969  ? ? ?Home Medications:  ?Allergies as of 04/16/2022   ? ?   Reactions  ? Dicyclomine Other (See Comments)  ? Dry cough  ? Glucophage [metformin] Diarrhea  ? Losartan Other (See Comments)  ? Heartburn  ? ?  ? ?  ?Medication List  ?  ? ?  ? Accurate as of Apr 16, 2022  2:51 PM. If you have any questions, ask your nurse or doctor.  ?  ?  ? ?  ? ?atorvastatin 20 MG tablet ?Commonly known as: LIPITOR ?TAKE (1) TABLET BY MOUTH ONCE DAILY. ?What changed: See the new instructions. ?  ?Cholecalciferol 50 MCG (2000 UT) Caps ?Take 1 capsule (2,000 Units total) by mouth daily. ?  ?clobetasol ointment 0.05 % ?Commonly known as: TEMOVATE ?Apply topically at bedtime. ?  ?diltiazem 120 MG 24 hr capsule ?Commonly known as: TIAZAC ?Take 120 mg by mouth daily. ?  ?Eliquis 5 MG Tabs tablet ?Generic drug: apixaban ?TAKE (1) TABLET BY MOUTH TWICE DAILY. ?What changed: See the new instructions. ?  ?famotidine 20 MG tablet ?Commonly known as: Pepcid ?Take 1 tablet (20 mg total) by mouth 2 (two) times daily as needed for heartburn or indigestion. ?  ?Hibiclens 4 % external liquid ?Generic drug: chlorhexidine ?Apply topically daily as needed. ?  ?hyoscyamine 0.125 MG tablet ?Commonly known as: Levsin ?Take 1 tablet (0.125 mg total) by mouth every 6 (six) hours as needed for cramping. ?  ?levothyroxine 100 MCG tablet ?Commonly known as: SYNTHROID ?TAKE 1 TABLET BY MOUTH ON AN EMPTY STOMACH 30 MINUTES BEFORE BREAKFAST. ?What changed: See the new instructions. ?  ?lisinopril-hydrochlorothiazide 20-25 MG tablet ?Commonly known as: ZESTORETIC ?TAKE (1) TABLET BY MOUTH EACH MORNING. ?What changed: See the new instructions. ?   ?mirtazapine 7.5 MG tablet ?Commonly known as: REMERON ?Take 1 tablet (7.5 mg total) by mouth at bedtime. ?  ?mupirocin ointment 2 % ?Commonly known as: BACTROBAN ?Apply 1 application topically 2 (two) times daily. ?  ?nitroGLYCERIN 0.4 MG SL tablet ?Commonly known as: NITROSTAT ?Place 1 tablet (0.4 mg total) under the tongue every 5 (five) minutes as needed for chest pain. ?  ?ursodiol 250 MG tablet ?Commonly known as: ACTIGALL ?Take 250 mg by mouth 2 (two) times daily. ?  ?vitamin B-12 500 MCG tablet ?Commonly known as: CYANOCOBALAMIN ?Take 1,000 mcg by mouth daily. ?  ? ?  ? ? ?Allergies:  ?Allergies  ?Allergen Reactions  ? Dicyclomine Other (See Comments)  ?  Dry cough  ? Glucophage [Metformin] Diarrhea  ? Losartan Other (See Comments)  ?  Heartburn ?  ? ? ?  Family History: ?Family History  ?Problem Relation Age of Onset  ? Hypertension Mother   ? Dementia Mother   ? Vision loss Mother   ? Heart disease Father   ? Hearing loss Father   ? Stroke Maternal Grandfather   ? Heart disease Brother   ? ? ?Social History:  reports that she has never smoked. She has never used smokeless tobacco. She reports that she does not currently use alcohol. She reports that she does not use drugs. ? ?ROS: ?All other review of systems were reviewed and are negative except what is noted above in HPI ? ?Physical Exam: ?BP 106/62   Pulse (!) 59   ?Constitutional:  Alert and oriented, No acute distress. ?HEENT: [place] AT, moist mucus membranes.  Trachea midline, no masses. ?Cardiovascular: No clubbing, cyanosis, or edema. ?Respiratory: Normal respiratory effort, no increased work of breathing. ?GI: Abdomen is soft, nontender, nondistended, no abdominal masses ?GU: No CVA tenderness.  ?Lymph: No cervical or inguinal lymphadenopathy. ?Skin: No rashes, bruises or suspicious lesions. ?Neurologic: Grossly intact, no focal deficits, moving all 4 extremities. ?Psychiatric: Normal mood and affect. ? ?Laboratory Data: ?Lab Results  ?Component Value  Date  ? WBC 6.7 04/07/2022  ? HGB 12.6 04/07/2022  ? HCT 36.4 04/07/2022  ? MCV 89 04/07/2022  ? PLT 171 04/07/2022  ? ? ?Lab Results  ?Component Value Date  ? CREATININE 0.99 04/07/2022  ? ? ?No results found for: PSA

## 2022-04-16 NOTE — Patient Instructions (Signed)

## 2022-04-30 ENCOUNTER — Institutional Professional Consult (permissible substitution): Payer: Medicare Other | Admitting: Cardiovascular Disease

## 2022-04-30 NOTE — Progress Notes (Deleted)
Watchman Consult Note Date:  04/30/2022   ID:  Tasha Roberts, DOB Apr 30, 1941, MRN [id number]  PCP:  Lindell Spar, MD  Cardiologist:  Carlyle Dolly, MD Primary Electrophysiologist: none Referring Physician: Carlyle Dolly, MD   CC: to discuss Watchman implant    History of Present Illness: Tasha Roberts is a 80 y.o. female referred by Dr Harl Bowie for evaluation of atrial fibrillation and stroke prevention. She has paroxysmal atrial fibrillation.  The patient has been evaluated by their referring physician and is felt to be a poor candidate for long term [place] due to NASH cirrhosis, thrombocytopenia, chronic iron deficiency anemia.  She therefore presents today for Watchman evaluation.    Today, she denies symptoms of palpitations, chest pain, shortness of breath, orthopnea, PND, lower extremity edema, claudication, dizziness, presyncope, syncope, bleeding, or neurologic sequela. The patient is tolerating medications without difficulties and is otherwise without complaint today.    Past Medical History:  Diagnosis Date   Anemia due to blood loss    iron infusion's last one 03-25-2021 (pt bleeding post vulvar bx 02-11-2021, hospiatal admission 02-21-2021)   Anticoagulant long-term use    eliquis--- managed by pcp    Arthritis    hands   Bronchiectasis [facility])    pulmonology --- dr Halford Chessman   (04-01-2021  pt stated no supplemental oxygen use since 01/ 2022 and no inhaler use,  stated checks O2 at home during the day 98-99%)   Cancer ([place]) 2013   Lumpectomy left breast   Cataract 2015   Chronic constipation    Clotting disorder ([place])    On eliquis   Coronary artery calcification    per lexiscan 08-17-2019 result in care everywhere   Depression, major, single episode, moderate ([place]) 12/05/2021   DM II (diabetes mellitus, type II), controlled ([place]) 08/27/2005   DOE (dyspnea on exertion)    04-01-2021  per pt only going up stairs    Gastroesophageal reflux disease 12/05/2021   History of  diabetes mellitus, type II    04-01-2021  per pt no issues after weight loss, last taken medication approx 2017   History of Graves' disease 2017   s/p RAI   History of left breast cancer    dx 2013,  s/p left partial mastecotmy w/ node dissection 09-15-2012, low grade DCIS, no chemo, completed radiation 02/ 2014,  per pt no recurrence   History of respiratory syncytial virus (RSV) infection 09/2020   w/ hypoxia  , hospital admission 10/ 2021  and acute exacerbation bronchiectasis;  follow up post hospital pulmonogy dr Halford Chessman  note in epic   Hyperlipidemia    Hypertension    followed by pcp   (pt had lexiscan in care everywhere dated 08-17-2019 no ischemia , non-obstructive extensive calcification, ef 69%)   Hypothyroidism    Hypothyroidism, postradioiodine therapy    followed by pcp   Liver cirrhosis secondary to NASH (nonalcoholic steatohepatitis) ([place])    followed by dr Merrilee Jansky ([facility])---- dx 2004,  compensated ,  last liver bx (03/ 2015) fibrosis stage 3;  moderate portal hypertensive gastrophy   PAF (paroxysmal atrial fibrillation) ([place]) previous cardiologist lov note w/ Suann Larry PA ([facility] in [place]) dated 01-24-2020 in care everywhere;  (04-01-2021 per pt has appt w/ new cardiologist , dr j. branch 04-30-2021)   first dx 2020--  had event monitor/ stress test/ echo all results in care everywhere (monitor 08-03-2019 short runs AFib conversion pause <2 secconds, rate controlled;  echo 12-14-2019 ef  60%, mild concentric LVH, G2DD, RSVP 50.58mHg)   Personal history of radiation therapy 2013   left breast cancer,  completed 02/ 2014   PONV (postoperative nausea and vomiting)    Sarcoidosis, lung ([facility] pulmonology--- dr sHalford Chessman  04-01-2021  per pt dx age 89s, no issue since   Thrombocytopenia ([facility]    hematology/ oncology--- dr s. kDelton Coombes  VIN III (vulvar intraepithelial neoplasia III)    Past Surgical History:  Procedure Laterality Date    [place]  BREAST SURGERY  09/15/2012   CATARACT EXTRACTION W/ INTRAOCULAR LENS  IMPLANT, BILATERAL  2016   CHOLECYSTECTOMY OPEN  13329  CO2 LASER APPLICATION N/A 051/88/4166  Procedure: CO2 LASER APPLICATION OF THE VULVA;  Surgeon: REveritt Amber MD;  [place]  Service: Gynecology;  Laterality: N/A;   COLONOSCOPY WITH ESOPHAGOGASTRODUODENOSCOPY (EGD)  last one 08/ 2018  '@[facility]'$    EYE SURGERY  2015   Cataract removed both eyes   LUMBAR LAMINECTOMY  2009   PARTIAL MASTECTOMY WITH AXILLARY SENTINEL LYMPH NODE BIOPSY [place]  TUBAL LIGATION  08/08/1969     Current Outpatient Medications  Medication Sig Dispense Refill   apixaban (ELIQUIS) 5 MG TABS tablet TAKE (1) TABLET BY MOUTH TWICE DAILY. (Patient taking differently: Take 5 mg by mouth 2 (two) times daily.) 180 tablet 1   atorvastatin (LIPITOR) 20 MG tablet TAKE (1) TABLET BY MOUTH ONCE DAILY. (Patient taking differently: Take 20 mg by mouth daily.) 90 tablet 1   chlorhexidine (HIBICLENS) 4 % external liquid Apply topically daily as needed. 120 mL 0   Cholecalciferol 50 MCG (2000 UT) CAPS Take 1 capsule (2,000 Units total) by mouth daily. 30 capsule    clobetasol ointment (TEMOVATE) 0.05 % Apply topically at bedtime.     diltiazem (TIAZAC) 120 MG 24 hr capsule Take 120 mg by mouth daily.     famotidine (PEPCID) 20 MG tablet Take 1 tablet (20 mg total) by mouth 2 (two) times daily as needed for heartburn or indigestion. 60 tablet 2   hyoscyamine (LEVSIN) 0.125 MG tablet Take 1 tablet (0.125 mg total) by mouth every 6 (six) hours as needed for cramping. 30 tablet 0   levothyroxine (SYNTHROID) 100 MCG tablet TAKE 1 TABLET BY MOUTH ON AN EMPTY STOMACH 30 MINUTES BEFORE BREAKFAST. (Patient taking differently: Take 100 mcg by mouth daily before breakfast.) 90 tablet 0   lisinopril-hydrochlorothiazide (ZESTORETIC) 20-25 MG  tablet TAKE (1) TABLET BY MOUTH EACH MORNING. (Patient taking differently: Take 1 tablet by mouth daily.) 90 tablet 0   mirabegron ER (MYRBETRIQ) 25 MG TB24 tablet Take 1 tablet (25 mg total) by mouth daily. 30 tablet 0   mirtazapine (REMERON) 7.5 MG tablet Take 1 tablet (7.5 mg total) by mouth at bedtime. 30 tablet 2   mupirocin ointment (BACTROBAN) 2 % Apply 1 application topically 2 (two) times daily. 22 g 0   nitroGLYCERIN (NITROSTAT) 0.4 MG SL tablet Place 1 tablet (0.4 mg total) under the tongue every 5 (five) minutes as needed for chest pain. 10 tablet 0   ursodiol (ACTIGALL) 250 MG tablet Take 250 mg by mouth 2 (two) times daily.     vitamin B-12 (CYANOCOBALAMIN) 500 MCG tablet Take 1,000 mcg by mouth daily.     No current facility-administered medications for this visit.    Allergies:   Dicyclomine, Glucophage [metformin],  and Losartan   Social History:  The patient  reports that she has never smoked. She has never used smokeless tobacco. She reports that she does not currently use alcohol. She reports that she does not use drugs.   Family History:  The patient's  family history includes Dementia in her mother; Hearing loss in her father; Heart disease in her brother and father; Hypertension in her mother; Stroke in her maternal grandfather; Vision loss in her mother.    ROS:  Please see the history of present illness.   All other systems are reviewed and negative.    PHYSICAL EXAM: VS:  There were no vitals taken for this visit. , BMI There is no height or weight on file to calculate BMI. GEN: Well nourished, well developed, in no acute distress  HEENT: normal  Neck: no JVD, carotid bruits, or masses Cardiac: RRR; no murmurs, rubs, or gallops,no edema  Respiratory:  clear to auscultation bilaterally, normal work of breathing GI: soft, nontender, nondistended, + BS MS: no deformity or atrophy  Skin: warm and dry  Neuro:  Strength and sensation are intact Psych: euthymic mood,  full affect  EKG:  EKG {ACTION; IS/IS PNT:[id number]} ordered today. The ekg ordered today shows ***   Recent Labs: 07/31/2021: TSH 0.771 03/28/2022: ALT 14; Magnesium 1.8 04/07/2022: BUN 23; Creatinine, Ser 0.99; Hemoglobin 12.6; Platelets 171; Potassium 3.7; Sodium 145    Lipid Panel  No results found for: CHOL, TRIG, HDL, CHOLHDL, VLDL, LDLCALC, LDLDIRECT   Wt Readings from Last 3 Encounters:  04/07/22 134 lb 9.6 oz (61.1 kg)  04/01/22 139 lb 3.2 oz (63.1 kg)  03/27/22 133 lb (60.3 kg)      Other studies Reviewed: Additional studies/ records that were reviewed today include: ***     ASSESSMENT AND PLAN:  1.  Paroxysmal atrial fibrillation I have seen Tasha Roberts is a 81 y.o. female in the office today who has been referred for a Watchman left atrial appendage closure device.  She has a history of paroxysmal atrial fibrillation.    CHA2DS2-VASc Score = 4  {Confirm score is correct.  If not, click here to update score.  REFRESH note.  :1} This indicates a 4.8% annual risk of stroke. The patient's score is based upon: CHF History: 0 HTN History: 1 Diabetes History: 0 Stroke History: 0 Vascular Disease History: 0 Age Score: 2 Gender Score: 1   {This patient has a significant risk of stroke if diagnosed with atrial fibrillation.  Please consider VKA or DOAC agent for anticoagulation if the bleeding risk is acceptable.   You can also use the SmartPhrase .[place] for documentation.   :[id number]}    HAS-BLED score  Hypertension No  Abnormal renal and liver function (Dialysis, transplant, Cr >2.26 mg/dL /Cirrhosis or Bilirubin >2x Normal or AST/ALT/AP >3x Normal) Yes  Stroke No  Bleeding Yes  Labile INR (Unstable/high INR) No  Elderly (>65) Yes  Drugs or alcohol (? 8 drinks/week, anti-plt or NSAID) No   Unfortunately, She is not felt to be a long term anticoagulation candidate secondary to NASH cjirrhosis, chronic thrombocytopenia, and iron deficiency anemia.  The  patients chart has been reviewed and I along with their referring cardiologist feel that they would be a candidate for short term oral anticoagulation.  Procedural risks for the Watchman implant have been reviewed with the patient including a 1% risk of stroke, 1% risk of perforation or pericardial effusion, 0.1% risk of device embolization.  Given the patient's poor candidacy  for long-term oral anticoagulation, ability to tolerate short term oral anticoagulation, we had a shared decision making conversation regarding potential treatment options. With her CHADS-Vasc score of 4, a strategy of stroke prevention with long-term [place] or Watchman LAAO is appropriate. Because of her increased risk of bleeding outlined above, Watchman LAAO is an appropriate consideration. After extensive discussion today, the patient would like to proceed with further evaluation.   Prior to the procedure, I would like to obtain a gated CT scan of the chest with contrast timed for PV/LA visualization.  Additionally, the patient will need ***.  Once these are performed I will review to confirm the patient continues to be a suitable candidate for Watchman implant. If so, we will schedule for the implant procedure at the first available date.  Current medicines are reviewed at length with the patient today.   The patient {ACTIONS; HAS/DOES NOT HAVE:19233} concerns regarding her medicines.  The following changes were made today:  {NONE DEFAULTED:18576}  Labs/ tests ordered today include: *** No orders of the defined types were placed in this encounter.    Signed, Sherren Mocha, MD 04/30/2022  8:39 AM     [place] [address] [phone] (office) [phone] (fax)

## 2022-05-09 ENCOUNTER — Ambulatory Visit (INDEPENDENT_AMBULATORY_CARE_PROVIDER_SITE_OTHER): Payer: Medicare Other | Admitting: Cardiovascular Disease

## 2022-05-09 ENCOUNTER — Encounter: Payer: Self-pay | Admitting: Cardiovascular Disease

## 2022-05-09 VITALS — BP 114/50 | HR 72 | Ht 63.0 in | Wt 131.6 lb

## 2022-05-09 DIAGNOSIS — I480 Paroxysmal atrial fibrillation: Secondary | ICD-10-CM | POA: Diagnosis not present

## 2022-05-09 DIAGNOSIS — Z01810 Encounter for preprocedural cardiovascular examination: Secondary | ICD-10-CM

## 2022-05-09 NOTE — Progress Notes (Signed)
Watchman Consult Note Date:  05/09/2022   ID:  Calirose Mccance, DOB [birth year], MRN [id number]  PCP:  Lindell Spar, MD  Cardiologist:  Dr Harl Bowie Primary Electrophysiologist: none Referring Physician: Dr Harl Bowie   CC: to discuss Watchman implant    History of Present Illness: Nadira Single is a 81 y.o. female referred by Dr Harl Bowie for evaluation of atrial fibrillation and stroke prevention. She has paroxysmal atrial fibrillation.  The patient has been evaluated by their referring physician and is felt to be a poor candidate for long term [place] due to [facility] cirrhosis, thrombocytopenia, extensive bruising, and iron deficiency anemia.  She therefore presents today for Watchman evaluation.  She has required treatment for iron deficiency anemia over the years and she is treated with IV iron infusions.  Earlier this year she underwent EGD and colonoscopy studies.  Her EGD was pertinent for demonstration of moderate portal hypertension gastropathy and her colonoscopy was essentially normal.  She has not had obvious GI bleeding, nor has she required packed red blood cell transfusions.  She is felt to have occult GI blood loss likely worsened by chronic oral anticoagulation.  She complains of generalized fatigue and exertional dyspnea, both stable.  She otherwise has no specific cardiac-related complaints   Today, she denies symptoms of palpitations, chest pain, orthopnea, PND, lower extremity edema, claudication, dizziness, presyncope, syncope, bleeding, or neurologic sequela. The patient is tolerating medications without difficulties and is otherwise without complaint today.    Past Medical History:  Diagnosis Date   Anemia due to blood loss    iron infusion's last one 03-25-2021 (pt bleeding post vulvar bx 02-11-2021, hospiatal admission 02-21-2021)   Anticoagulant long-term use    eliquis--- managed by pcp    Arthritis    hands   Bronchiectasis [facility])    pulmonology --- dr Halford Chessman   (04-01-2021  pt stated  no supplemental oxygen use since 01/ 2022 and no inhaler use,  stated checks O2 at home during the day 98-99%)   Cancer ([place]) 2013   Lumpectomy left breast   Cataract 2015   Chronic constipation    Clotting disorder ([place])    On eliquis   Coronary artery calcification    per lexiscan 08-17-2019 result in care everywhere   Depression, major, single episode, moderate ([place]) 12/05/2021   DM II (diabetes mellitus, type II), controlled ([place]) 08/27/2005   DOE (dyspnea on exertion)    04-01-2021  per pt only going up stairs    Gastroesophageal reflux disease 12/05/2021   History of diabetes mellitus, type II    04-01-2021  per pt no issues after weight loss, last taken medication approx 2017   History of Graves' disease 2017   s/p RAI   History of left breast cancer    dx 2013,  s/p left partial mastecotmy w/ node dissection 09-15-2012, low grade DCIS, no chemo, completed radiation 02/ 2014,  per pt no recurrence   History of respiratory syncytial virus (RSV) infection 09/2020   w/ hypoxia  , hospital admission 10/ 2021  and acute exacerbation bronchiectasis;  follow up post hospital pulmonogy dr Halford Chessman  note in epic   Hyperlipidemia    Hypertension    followed by pcp   (pt had lexiscan in care everywhere dated 08-17-2019 no ischemia , non-obstructive extensive calcification, ef 69%)   Hypothyroidism    Hypothyroidism, postradioiodine therapy    followed by pcp   Liver cirrhosis secondary to NASH (nonalcoholic steatohepatitis) ([place])    followed by  dr Merrilee Jansky ([facility])---- dx 2004,  compensated ,  last liver bx (03/ 2015) fibrosis stage 3;  moderate portal hypertensive gastrophy   PAF (paroxysmal atrial fibrillation) ([place]) previous cardiologist lov note w/ Suann Larry PA ([place] [facility] in [place]) dated 01-24-2020 in care everywhere;  (04-01-2021 per pt has appt w/ new cardiologist , dr j. 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 kDelton Coombes  VIN III (vulvar intraepithelial neoplasia III)    Past Surgical History:  Procedure Laterality Date   [place]  BREAST SURGERY  09/15/2012   CATARACT EXTRACTION W/ INTRAOCULAR LENS  IMPLANT, BILATERAL  2016   CHOLECYSTECTOMY OPEN  16269  CO2 LASER APPLICATION N/A 048/54/6270  Procedure: CO2 LASER APPLICATION OF THE VULVA;  Surgeon: REveritt Amber MD;  Location: [facility]  Service: Gynecology;  Laterality: N/A;   COLONOSCOPY WITH ESOPHAGOGASTRODUODENOSCOPY (EGD)  last one 08/ 2018  _0    EYE SURGERY  2015   Cataract removed both eyes   LUMBAR LAMINECTOMY  2009   PARTIAL MASTECTOMY WITH AXILLARY SENTINEL LYMPH NODE BIOPSY Left 09-15-2012   [place]  TUBAL LIGATION  08/08/1969     Current Outpatient Medications  Medication Sig Dispense Refill   apixaban (ELIQUIS) 5 MG TABS tablet TAKE (1) TABLET BY MOUTH TWICE DAILY. (Patient taking differently: Take 5 mg by mouth 2 (two) times daily.) 180 tablet 1   atorvastatin (LIPITOR) 20 MG tablet TAKE (1) TABLET BY MOUTH ONCE DAILY. (Patient taking differently: Take 20 mg by mouth daily.) 90 tablet 1   Cholecalciferol 50 MCG (2000 UT) CAPS Take 1 capsule (2,000 Units total) by mouth daily. 30 capsule    clobetasol ointment (TEMOVATE) 0.05 % Apply topically at bedtime.     diltiazem (TIAZAC) 120 MG 24 hr capsule Take 120 mg by mouth daily.     famotidine (PEPCID) 20 MG tablet Take 1  tablet (20 mg total) by mouth 2 (two) times daily as needed for heartburn or indigestion. 60 tablet 2   hyoscyamine (LEVSIN) 0.125 MG tablet Take 1 tablet (0.125 mg total) by mouth every 6 (six) hours as needed for cramping. 30 tablet 0   levothyroxine (SYNTHROID) 100 MCG tablet TAKE 1 TABLET BY MOUTH ON AN EMPTY STOMACH 30 MINUTES BEFORE BREAKFAST. (Patient taking differently: Take 100 mcg by mouth daily before breakfast.) 90 tablet 0   lisinopril-hydrochlorothiazide (ZESTORETIC) 20-25 MG tablet TAKE (1) TABLET BY MOUTH EACH MORNING. (Patient taking differently: Take 1 tablet by mouth daily.) 90 tablet 0   mirabegron ER (MYRBETRIQ) 25 MG TB24 tablet Take 1 tablet (25 mg total) by mouth daily. 30 tablet 0   mirtazapine (REMERON) 7.5 MG tablet Take 1 tablet (7.5 mg total) by mouth at bedtime. 30 tablet 2   nitroGLYCERIN (NITROSTAT) 0.4 MG SL tablet Place 1 tablet (0.4 mg total) under the tongue every 5 (  five) minutes as needed for chest pain. 10 tablet 0   ursodiol (ACTIGALL) 250 MG tablet Take 250 mg by mouth 2 (two) times daily.     vitamin B-12 (CYANOCOBALAMIN) 500 MCG tablet Take 500 mcg by mouth daily.     No current facility-administered medications for this visit.    Allergies:   Dicyclomine, Glucophage [metformin], and Losartan   Social History:  The patient  reports that she has never smoked. She has never used smokeless tobacco. She reports that she does not currently use alcohol. She reports that she does not use drugs.   Family History:  The patient's  family history includes Dementia in her mother; Hearing loss in her father; Heart disease in her brother and father; Hypertension in her mother; Stroke in her maternal grandfather; Vision loss in her mother.    ROS:  Please see the history of present illness.   All other systems are reviewed and negative.    PHYSICAL EXAM: VS:  BP (!) 114/50   Pulse 72   Ht _0  (1.6 m)   Wt 131 lb 9.6 oz (59.7 kg)   SpO2 98%   BMI 23.31 kg/m   , BMI Body mass index is 23.31 kg/m. GEN: Well nourished, well developed, in no acute distress  HEENT: normal  Neck: no JVD, carotid bruits, or masses Cardiac: RRR; no murmurs, rubs, or gallops,no edema  Respiratory:  clear to auscultation bilaterally, normal work of breathing GI: soft, nontender, nondistended, + BS MS: no deformity or atrophy  Skin: warm and dry with extensive bruising on her arms and legs Neuro:  Strength and sensation are intact Psych: euthymic mood, full affect  EKG:  EKG is not ordered today.   Recent Labs: 07/31/2021: TSH 0.771 03/28/2022: ALT 14; Magnesium 1.8 04/07/2022: BUN 23; Creatinine, Ser 0.99; Hemoglobin 12.6; Platelets 171; Potassium 3.7; Sodium 145    Lipid Panel  No results found for: CHOL, TRIG, HDL, CHOLHDL, VLDL, LDLCALC, LDLDIRECT   Wt Readings from Last 3 Encounters:  05/09/22 131 lb 9.6 oz (59.7 kg)  04/07/22 134 lb 9.6 oz (61.1 kg)  04/01/22 139 lb 3.2 oz (63.1 kg)      Other studies Reviewed: Additional studies/ records that were reviewed today include:  2D echocardiogram 12/14/2019 [facility] clinic): Summary   1. Overall left ventricular ejection fraction is estimated at 60 to 65%.   2. Normal global left ventricular systolic function.   3. (Grade 2) Moderately abnormal left ventricular diastolic filling.   4. Mild concentric left ventricular hypertrophy.   5. Mildly dilated left atrium.   6. Mild mitral annular calcification.   7. Moderately elevated pulmonary artery systolic pressure.   8. No prior echo.     ASSESSMENT AND PLAN: Paroxysmal atrial fibrillation I have seen Kaia Depaolis is a 81 y.o. female in the office today who has been referred for a Watchman left atrial appendage closure device.  She has a history of paroxysmal atrial fibrillation.  Her atrial fibrillation risk scores are:   CHA2DS2-VASc Score = 4   This indicates a 4.8% annual risk of stroke. The patient's score is based upon: CHF History: 0 HTN  History: 1 Diabetes History: 0 Stroke History: 0 Vascular Disease History: 0 Age Score: 2 Gender Score: 1       HAS-BLED score  Hypertension No  Abnormal renal and liver function (Dialysis, transplant, Cr >2.26 mg/dL /Cirrhosis or Bilirubin >2x Normal or AST/ALT/AP >3x Normal) Yes  Stroke No  Bleeding Yes  Labile INR (Unstable/high INR) No  Elderly (>65) Yes  Drugs or alcohol (? 8 drinks/week, anti-plt or NSAID) No   Unfortunately, She is not felt to be a long term anticoagulation candidate secondary to [place] cirrhosis, thrombocytopenia, extensive bruising.  The patients chart has been reviewed and I along with their referring cardiologist feel that they would be a candidate for short term oral anticoagulation.  I have reviewed her recent blood work with platelet count as low as 56,000, currently on most recent labs demonstrating improved platelet count up to 171,000.  In reviewing old labs her platelet count has trended up and down over the past few years but never less than 50,000.  Her hemoglobin was recently as low as 9.4, but on her labs from Apr 07, 2022 was normalized at 12.6.  Procedural risks for the Watchman implant have been reviewed with the patient including a <1% risk of stroke, <1% risk of perforation or pericardial effusion, 0.1% risk of device embolization.  She understands the procedural risks of life-threatening complication in general are less than 1%.  As part of her evaluation today, I demonstrated a procedural animation of left atrial appendage occlusion with the watchman flex device, reviewed procedural steps, and again discussed potential complications, rationale for implantation, and expected recovery.  We also discussed postprocedural anticoagulation with plans for a TEE at 45 days followed by transition from apixaban to clopidogrel at that time if criteria met.  Given the patient's poor candidacy for long-term oral anticoagulation, anticipated ability to tolerate short  term oral anticoagulation, I think left atrial appendage occlusion therapy is an appropriate treatment option and this is discussed with the patient through a shared decision making conversation with the patient.  She would like to proceed with further evaluation.  Prior to the procedure, I will obtain a gated cardiac CTA to assess left atrial appendage anatomy and anatomic suitability for watchman flex implantation.  Once her CTA study is completed, I will review to confirm the patient continues to be a suitable candidate for Watchman implant. If so, we will schedule for the implant procedure at the first available date.  In the interim she will remain on apixaban 5 mg twice daily.  Current medicines are reviewed at length with the patient today.    The patient does not have concerns regarding her medicines.  The following changes were made today:  none  Labs/ tests ordered today include:  Orders Placed This Encounter  Procedures   CT CARDIAC MORPH/PULM VEIN W/CM&W/O CA SCORE   Basic metabolic panel     Signed, Sherren Mocha, MD 05/09/2022  12:36 PM     [address] [place] [address] [phone] (office) [phone] (fax)

## 2022-05-09 NOTE — Patient Instructions (Addendum)
Medication Instructions:  NONE *If you need a refill on your cardiac medications before your next appointment, please call your pharmacy*   Lab Work: BMET TODAY If you have labs (blood work) drawn today and your tests are completely normal, you will receive your results only by: [place] (if you have MyChart) OR A paper copy in the mail If you have any lab test that is abnormal or we need to change your treatment, we will call you to review the results.  Testing/Procedures: Watchman CT Your physician has requested that you have cardiac CT. Cardiac computed tomography (CT) is a painless test that uses an x-ray machine to take clear, detailed pictures of your heart. For further information please visit [email or web address][email or web address]. Please follow instruction sheet as given.  Follow-Up: At [facility], you and your health needs are our priority.  As part of our continuing mission to provide you with exceptional heart care, we have created designated Provider Care Teams.  These Care Teams include your primary Cardiologist (physician) and Advanced Practice Providers (APPs -  Physician Assistants and Nurse Practitioners) who all work together to provide you with the care you need, when you need it.  Your next appointment:   Structural Team will follow-up  The format for your next appointment:   In Person  Provider:   Sherren Mocha, MD  [facility] CT INSTRUCTIONS: Your WATCHMAN CT will be scheduled at [place] will be called to confirm date and time.  The day of your CT appointment, please enter Tasha Roberts through the [employer] [facility] Entrance (Entrance C off [facility] You may use the FREE valet parking offered at entrance C (encouraged to control the heart rate for the test). Then proceed to the [facility] Radiology Department (first floor) for check-in and test prep 30 minutes prior to your scheduled appointment.   Please follow these instructions  carefully:  On the Night Before the Test: Be sure to Drink plenty of water. Do not consume any caffeinated/decaffeinated beverages or chocolate 12 hours prior to your test. Do not take any antihistamines 12 hours prior to your test.  On the Day of the Test: Drink plenty of water until 1 hour prior to the test. Do not eat any food 4 hours prior to the test. You may take your regular medications prior to the test.  HOLD Lisinopril/Hydrochlorothiazide morning of the test. FEMALES- please wear underwire-free bra if available      After the Test: Drink plenty of water. After receiving IV contrast, you may experience a mild flushed feeling. This is normal. On occasion, you may experience a mild rash up to 24 hours after the test. This is not dangerous. If this occurs, you can take Benadryl 25 mg and increase your fluid intake. If you experience trouble breathing, this can be serious. If it is severe call 911 IMMEDIATELY. If it is mild, please call our office. If you take any of these medications: Glipizide/Metformin, Avandament, Glucavance, please do not take 48 hours after completing test unless otherwise instructed.  Once we have confirmed authorization from your insurance company, you will be called to set up a date and time for your test.   For non-scheduling related questions/concerns about your CT scan, please contact the cardiac imaging nurses: Tasha Roberts, Cardiac Imaging Nurse Navigator Tasha Roberts, Cardiac Imaging Nurse [facility] Direct Office Dial: [phone]   For scheduling needs, including cancellations and rescheduling, please call Tasha Roberts, [phone].  Tasha Roberts,  the Watchman Nurse Navigator, will call you after your CT once the Watchman Team has reviewed your imaging for an update on proceedings. Tasha Roberts's direct number is [phone] if you need assistance.   Important Information About Sugar

## 2022-05-10 LAB — BASIC METABOLIC PANEL
BUN/Creatinine Ratio: 26 (ref 12–28)
BUN: 26 mg/dL (ref 8–27)
CO2: 22 mmol/L (ref 20–29)
Calcium: 9.5 mg/dL (ref 8.7–10.3)
Chloride: 106 mmol/L (ref 96–106)
Creatinine, Ser: 0.99 mg/dL (ref 0.57–1.00)
Glucose: 84 mg/dL (ref 70–99)
Potassium: 4.2 mmol/L (ref 3.5–5.2)
Sodium: 145 mmol/L — ABNORMAL HIGH (ref 134–144)
eGFR: 58 mL/min/{1.73_m2} — ABNORMAL LOW (ref >59)

## 2022-05-14 ENCOUNTER — Ambulatory Visit (HOSPITAL_COMMUNITY)
Admission: RE | Admit: 2022-05-14 | Discharge: 2022-05-14 | Disposition: A | Discharge status: Home / Self Care | Payer: Medicare Other | Source: Ambulatory Visit | Attending: Urology | Admitting: Urology

## 2022-05-14 DIAGNOSIS — N201 Calculus of ureter: Secondary | ICD-10-CM | POA: Insufficient documentation

## 2022-05-16 ENCOUNTER — Other Ambulatory Visit: Payer: Self-pay | Admitting: Cardiology

## 2022-05-16 ENCOUNTER — Encounter: Payer: Self-pay | Admitting: Urology

## 2022-05-16 ENCOUNTER — Ambulatory Visit (INDEPENDENT_AMBULATORY_CARE_PROVIDER_SITE_OTHER): Payer: Medicare Other | Admitting: Urology

## 2022-05-16 DIAGNOSIS — N201 Calculus of ureter: Secondary | ICD-10-CM | POA: Diagnosis not present

## 2022-05-16 DIAGNOSIS — R32 Unspecified urinary incontinence: Secondary | ICD-10-CM | POA: Diagnosis not present

## 2022-05-16 LAB — URINALYSIS, ROUTINE W REFLEX MICROSCOPIC
Bilirubin, UA: NEGATIVE
Glucose, UA: NEGATIVE
Ketones, UA: NEGATIVE
Nitrite, UA: NEGATIVE
Protein,UA: NEGATIVE
Specific Gravity, UA: 1.015 (ref 1.005–1.030)
Urobilinogen, Ur: 0.2 mg/dL (ref 0.2–1.0)
pH, UA: 6 (ref 5.0–7.5)

## 2022-05-16 LAB — MICROSCOPIC EXAMINATION: Epithelial Cells (non renal): >10 /hpf — AB (ref 0–10)

## 2022-05-16 MED ORDER — SOLIFENACIN SUCCINATE 5 MG PO TABS: 5.0000 mg | ORAL_TABLET | Freq: Every day | ORAL | 11 refills | Status: DC

## 2022-05-16 NOTE — Patient Instructions (Signed)

## 2022-05-16 NOTE — Progress Notes (Signed)
05/16/2022 11:37 AM   Tasha Roberts [birth year] [id number]  Referring provider: Lindell Spar, MD [address] [place],  [place] [address]  Followup nephrolithiasis OAB   HPI: Tasha Roberts is a 81yo here for followup for nephrolithiasis and OAB. Renal US yesterday shows no calculi and no hydronephrosis. She noted no improvement in OAB symptoms with mirabegron.    PMH: Past Medical History:  Diagnosis Date   Anemia due to blood loss    iron infusion's last one 03-25-2021 (pt bleeding post vulvar bx 02-11-2021, hospiatal admission 02-21-2021)   Anticoagulant long-term use    eliquis--- managed by pcp    Arthritis    hands   Bronchiectasis [facility])    pulmonology --- dr Halford Chessman   (04-01-2021  pt stated no supplemental oxygen use since 01/ 2022 and no inhaler use,  stated checks O2 at home during the day 98-99%)   Cancer ([place]) 2013   Lumpectomy left breast   Cataract 2015   Chronic constipation    Clotting disorder ([place])    On eliquis   Coronary artery calcification    per lexiscan 08-17-2019 result in care everywhere   Depression, major, single episode, moderate ([place]) 12/05/2021   DM II (diabetes mellitus, type II), controlled ([place]) 08/27/2005   DOE (dyspnea on exertion)    04-01-2021  per pt only going up stairs    Gastroesophageal reflux disease 12/05/2021   History of diabetes mellitus, type II    04-01-2021  per pt no issues after weight loss, last taken medication approx 2017   History of Graves' disease 2017   s/p RAI   History of left breast cancer    dx 2013,  s/p left partial mastecotmy w/ node dissection 09-15-2012, low grade DCIS, no chemo, completed radiation 02/ 2014,  per pt no recurrence   History of respiratory syncytial virus (RSV) infection 09/2020   w/ hypoxia  , hospital admission 10/ 2021  and acute exacerbation bronchiectasis;  follow up post hospital pulmonogy dr Halford Chessman  note in epic   Hyperlipidemia    Hypertension    followed by pcp   (pt had lexiscan in  care everywhere dated 08-17-2019 no ischemia , non-obstructive extensive calcification, ef 69%)   Hypothyroidism    Hypothyroidism, postradioiodine therapy    followed by pcp   Liver cirrhosis secondary to NASH (nonalcoholic steatohepatitis) ([place])    followed by dr Merrilee Jansky ([facility])---- dx 2004,  compensated ,  last liver bx (03/ 2015) fibrosis stage 3;  moderate portal hypertensive gastrophy   PAF (paroxysmal atrial fibrillation) ([place]) previous cardiologist lov note w/ Suann Larry PA ([facility] in [place]) dated 01-24-2020 in care everywhere;  (04-01-2021 per pt has appt w/ new cardiologist , dr j. branch 04-30-2021)   first dx 2020--  had event monitor/ stress test/ echo all results in care everywhere (monitor 08-03-2019 short runs AFib conversion pause <2 secconds, rate controlled;  echo 12-14-2019 ef 60%, mild concentric LVH, G2DD, RSVP 50.54mHg)   Personal history of radiation therapy 2013   left breast cancer,  completed 02/ 2014   PONV (postoperative nausea and vomiting)    Sarcoidosis, lung ([facility] pulmonology--- dr sHalford Chessman  04-01-2021  per pt dx age [age]s, no issue since   Thrombocytopenia ([facility]    hematology/ oncology--- dr s. kDelton Coombes  VIN III (vulvar intraepithelial neoplasia III)     Surgical History: Past Surgical History:  Procedure Laterality Date   [place]  W/  UNILATERAL SALPINGOOPHORECTOMY   APPENDECTOMY  1989   BREAST SURGERY  09/15/2012   CATARACT EXTRACTION W/ INTRAOCULAR LENS  IMPLANT, BILATERAL  2016   CHOLECYSTECTOMY OPEN  8502   CO2 LASER APPLICATION N/A 77/41/2878   Procedure: CO2 LASER APPLICATION OF THE VULVA;  Surgeon: Everitt Amber, MD;  Location: [facility];  Service: Gynecology;  Laterality: N/A;   COLONOSCOPY WITH ESOPHAGOGASTRODUODENOSCOPY (EGD)  last one 08/ 2018  '@[facility]'$    EYE SURGERY  2015   Cataract removed both eyes   LUMBAR LAMINECTOMY  2009   PARTIAL MASTECTOMY WITH AXILLARY  SENTINEL LYMPH NODE BIOPSY Left 09-15-2012   [facility] [place]   TUBAL LIGATION  08/08/1969    Home Medications:  Allergies as of 05/16/2022       Reactions   Dicyclomine Other (See Comments)   Dry cough   Glucophage [metformin] Diarrhea   Losartan Other (See Comments)   Heartburn        Medication List        Accurate as of May 16, 2022 11:37 AM. If you have any questions, ask your nurse or doctor.          atorvastatin 20 MG tablet Commonly known as: LIPITOR TAKE (1) TABLET BY MOUTH ONCE DAILY. What changed: See the new instructions.   Cholecalciferol 50 MCG (2000 UT) Caps Take 1 capsule (2,000 Units total) by mouth daily.   clobetasol ointment 0.05 % Commonly known as: TEMOVATE Apply topically at bedtime.   diltiazem 120 MG 24 hr capsule Commonly known as: TIAZAC Take 120 mg by mouth daily.   diltiazem 120 MG 24 hr capsule Commonly known as: CARDIZEM CD TAKE (1) CAPSULE BY MOUTH ONCE DAILY. Started by: Carlyle Dolly, MD   Eliquis 5 MG Tabs tablet Generic drug: apixaban TAKE (1) TABLET BY MOUTH TWICE DAILY. What changed: See the new instructions.   famotidine 20 MG tablet Commonly known as: Pepcid Take 1 tablet (20 mg total) by mouth 2 (two) times daily as needed for heartburn or indigestion.   hyoscyamine 0.125 MG tablet Commonly known as: Levsin Take 1 tablet (0.125 mg total) by mouth every 6 (six) hours as needed for cramping.   levothyroxine 100 MCG tablet Commonly known as: SYNTHROID TAKE 1 TABLET BY MOUTH ON AN EMPTY STOMACH 30 MINUTES BEFORE BREAKFAST. What changed: See the new instructions.   lisinopril-hydrochlorothiazide 20-25 MG tablet Commonly known as: ZESTORETIC TAKE (1) TABLET BY MOUTH EACH MORNING. What changed: See the new instructions.   mirabegron ER 25 MG Tb24 tablet Commonly known as: MYRBETRIQ Take 1 tablet (25 mg total) by mouth daily.   mirtazapine 7.5 MG tablet Commonly known as: REMERON Take 1 tablet (7.5 mg total) by  mouth at bedtime.   nitroGLYCERIN 0.4 MG SL tablet Commonly known as: NITROSTAT Place 1 tablet (0.4 mg total) under the tongue every 5 (five) minutes as needed for chest pain.   ursodiol 250 MG tablet Commonly known as: ACTIGALL Take 250 mg by mouth 2 (two) times daily.   vitamin B-12 500 MCG tablet Commonly known as: CYANOCOBALAMIN Take 500 mcg by mouth daily.        Allergies:  Allergies  Allergen Reactions   Dicyclomine Other (See Comments)    Dry cough   Glucophage [Metformin] Diarrhea   Losartan Other (See Comments)    Heartburn     Family History: Family History  Problem Relation Age of Onset   Hypertension Mother    Dementia Mother  Vision loss Mother    Heart disease Father    Hearing loss Father    Stroke Maternal Grandfather    Heart disease Brother     Social History:  reports that she has never smoked. She has never used smokeless tobacco. She reports that she does not currently use alcohol. She reports that she does not use drugs.  ROS: All other review of systems were reviewed and are negative except what is noted above in HPI  Physical Exam: There were no vitals taken for this visit.  Constitutional:  Alert and oriented, No acute distress. HEENT: [place] AT, moist mucus membranes.  Trachea midline, no masses. Cardiovascular: No clubbing, cyanosis, or edema. Respiratory: Normal respiratory effort, no increased work of breathing. GI: Abdomen is soft, nontender, nondistended, no abdominal masses GU: No CVA tenderness.  Lymph: No cervical or inguinal lymphadenopathy. Skin: No rashes, bruises or suspicious lesions. Neurologic: Grossly intact, no focal deficits, moving all 4 extremities. Psychiatric: Normal mood and affect.  Laboratory Data: Lab Results  Component Value Date   WBC 6.7 04/07/2022   HGB 12.6 04/07/2022   HCT 36.4 04/07/2022   MCV 89 04/07/2022   PLT 171 04/07/2022    Lab Results  Component Value Date   CREATININE 0.99  05/09/2022    No results found for: "PSA"  No results found for: "TESTOSTERONE"  Lab Results  Component Value Date   HGBA1C 5.4 04/07/2022    Urinalysis    Component Value Date/Time   COLORURINE YELLOW 03/27/2022 1555   APPEARANCEUR Clear 04/16/2022 1433   LABSPEC 1.013 03/27/2022 1555   PHURINE 6.0 03/27/2022 1555   GLUCOSEU Negative 04/16/2022 1433   HGBUR MODERATE (A) 03/27/2022 1555   BILIRUBINUR Negative 04/16/2022 1433   KETONESUR NEGATIVE 03/27/2022 1555   PROTEINUR Negative 04/16/2022 1433   PROTEINUR NEGATIVE 03/27/2022 1555   UROBILINOGEN 0.2 08/13/2021 0853   NITRITE Negative 04/16/2022 1433   NITRITE NEGATIVE 03/27/2022 1555   LEUKOCYTESUR 1+ (A) 04/16/2022 1433   LEUKOCYTESUR SMALL (A) 03/27/2022 1555    Lab Results  Component Value Date   LABMICR See below: 04/16/2022   WBCUA 0-5 04/16/2022   LABEPIT 0-10 04/16/2022   MUCUS Present (A) 04/16/2022   BACTERIA Few (A) 04/16/2022    Pertinent Imaging: Renal US yesterday: Images reviewed and discussed with the patient  No results found for this or any previous visit.  No results found for this or any previous visit.  No results found for this or any previous visit.  No results found for this or any previous visit.  Results for orders placed during the hospital encounter of 05/14/22  Ultrasound renal complete  Narrative CLINICAL DATA:  nephrolithiasis  EXAM: RENAL / URINARY TRACT ULTRASOUND COMPLETE  COMPARISON:  CT dated March 27, 2022  FINDINGS: Right Kidney:  Renal measurements: 9.8 x 3.7 x 5.3 cm = volume: 99 mL. Echogenicity within normal limits. No mass visualized. There is mild pelviectasis. Degree of pelviectasis is similar in comparison to prior CT scan.  Left Kidney:  Limited assessment secondary to suboptimal acoustic windows. Renal measurements: 8.7 x 4.1 x 3.7 cm = volume: 69 mL. Echogenicity within normal limits. No mass visualized. Previously described prominence of  the renal collecting system has decreased in comparison to prior.  Bladder:  Not visualized secondary to shadowing bowel gas.  Other:  None.  IMPRESSION: 1. Decreased prominence of the LEFT-sided renal collecting system in comparison to prior. 2. Mild RIGHT-sided pelviectasis, morphologically similar in comparison to prior  CT scan.   Electronically Signed By: Valentino Saxon M.D. On: 05/15/2022 14:22  No results found for this or any previous visit.  No results found for this or any previous visit.  No results found for this or any previous visit.   Assessment & Plan:    1. Ureteral stone -RTC 6 months with a renal US - Urinalysis, Routine w reflex microscopic  2. Urinary incontinence, unspecified type -We will trial vesicare '5mg'$  daily   No follow-ups on file.  Nicolette Bang, MD  [facility] [place]

## 2022-05-29 ENCOUNTER — Telehealth (HOSPITAL_COMMUNITY): Payer: Self-pay | Admitting: *Deleted

## 2022-05-29 NOTE — Telephone Encounter (Signed)
Reaching out to patient to offer assistance regarding upcoming cardiac imaging study; pt verbalizes understanding of appt date/time, parking situation and where to check in, pre-test NPO status, and verified current allergies; name and call back number provided for further questions should they arise  Gordy Clement RN Navigator Cardiac Imaging [facility] [phone] office [phone] cell  Patient aware to arrive at 11am.

## 2022-05-30 ENCOUNTER — Ambulatory Visit (HOSPITAL_COMMUNITY)
Admission: RE | Admit: 2022-05-30 | Discharge: 2022-05-30 | Disposition: A | Discharge status: Home / Self Care | Payer: Medicare Other | Source: Ambulatory Visit | Attending: Cardiovascular Disease | Admitting: Cardiovascular Disease

## 2022-05-30 DIAGNOSIS — I480 Paroxysmal atrial fibrillation: Secondary | ICD-10-CM | POA: Diagnosis present

## 2022-05-30 MED ORDER — IOHEXOL 350 MG/ML SOLN
100.0000 mL | Freq: Once | INTRAVENOUS | Status: AC | PRN
  Administered 2022-05-30: 100 mL via INTRAVENOUS

## 2022-06-03 ENCOUNTER — Telehealth: Payer: Self-pay

## 2022-06-06 NOTE — Telephone Encounter (Signed)
Reviewed with Dr. Alyson Ingles, Patient to come to office to discuss alternatives.

## 2022-06-07 ENCOUNTER — Other Ambulatory Visit: Payer: Self-pay | Admitting: Internal Medicine

## 2022-06-12 ENCOUNTER — Other Ambulatory Visit: Payer: Self-pay

## 2022-06-12 DIAGNOSIS — I4891 Unspecified atrial fibrillation: Secondary | ICD-10-CM

## 2022-06-12 DIAGNOSIS — D500 Iron deficiency anemia secondary to blood loss (chronic): Secondary | ICD-10-CM

## 2022-06-12 MED ORDER — SODIUM CHLORIDE 0.9 % IV SOLN: INTRAVENOUS | Status: DC

## 2022-06-16 ENCOUNTER — Ambulatory Visit (INDEPENDENT_AMBULATORY_CARE_PROVIDER_SITE_OTHER): Payer: Medicare Other | Admitting: Urology

## 2022-06-16 ENCOUNTER — Encounter: Payer: Self-pay | Admitting: Urology

## 2022-06-16 VITALS — BP 86/50 | HR 75

## 2022-06-16 DIAGNOSIS — N3001 Acute cystitis with hematuria: Secondary | ICD-10-CM

## 2022-06-16 DIAGNOSIS — R32 Unspecified urinary incontinence: Secondary | ICD-10-CM

## 2022-06-16 LAB — URINALYSIS, ROUTINE W REFLEX MICROSCOPIC
Bilirubin, UA: NEGATIVE
Glucose, UA: NEGATIVE
Ketones, UA: NEGATIVE
Nitrite, UA: POSITIVE — AB
Protein,UA: NEGATIVE
Specific Gravity, UA: 1.02 (ref 1.005–1.030)
Urobilinogen, Ur: 0.2 mg/dL (ref 0.2–1.0)
pH, UA: 5 (ref 5.0–7.5)

## 2022-06-16 LAB — MICROSCOPIC EXAMINATION: Renal Epithel, UA: NONE SEEN /hpf

## 2022-06-16 MED ORDER — TOLTERODINE TARTRATE ER 4 MG PO CP24: 4.0000 mg | ORAL_CAPSULE | Freq: Every day | ORAL | 11 refills | Status: DC

## 2022-06-16 NOTE — Patient Instructions (Signed)

## 2022-06-16 NOTE — Progress Notes (Signed)
06/16/2022 1:57 PM   Tasha Roberts 05/13/1941 [id number]  Referring provider: Lindell Spar, MD [address] [place],  [place] [address]  Followup OAB  HPI: Ms Tasha Roberts is a 81yo here for followup for OAB. Vesicare worked well but caused severe drymouth. She was able to stop wearing pads. No straining to urinate. Overall she is very happy with her response to vesicare. No other complaints today   PMH: Past Medical History:  Diagnosis Date   Anemia due to blood loss    iron infusion's last one 03-25-2021 (pt bleeding post vulvar bx 02-11-2021, hospiatal admission 02-21-2021)   Anticoagulant long-term use    eliquis--- managed by pcp    Arthritis    hands   Bronchiectasis [facility])    pulmonology --- dr Halford Chessman   (04-01-2021  pt stated no supplemental oxygen use since 01/ 2022 and no inhaler use,  stated checks O2 at home during the day 98-99%)   Cancer ([place]) 2013   Lumpectomy left breast   Cataract 2015   Chronic constipation    Clotting disorder ([place])    On eliquis   Coronary artery calcification    per lexiscan 08-17-2019 result in care everywhere   Depression, major, single episode, moderate ([place]) 12/05/2021   DM II (diabetes mellitus, type II), controlled ([place]) 08/27/2005   DOE (dyspnea on exertion)    04-01-2021  per pt only going up stairs    Gastroesophageal reflux disease 12/05/2021   History of diabetes mellitus, type II    04-01-2021  per pt no issues after weight loss, last taken medication approx 2017   History of Graves' disease 2017   s/p RAI   History of left breast cancer    dx 2013,  s/p left partial mastecotmy w/ node dissection 09-15-2012, low grade DCIS, no chemo, completed radiation 02/ 2014,  per pt no recurrence   History of respiratory syncytial virus (RSV) infection 09/2020   w/ hypoxia  , hospital admission 10/ 2021  and acute exacerbation bronchiectasis;  follow up post hospital pulmonogy dr Halford Chessman  note in epic   Hyperlipidemia    Hypertension     followed by pcp   (pt had lexiscan in care everywhere dated 08-17-2019 no ischemia , non-obstructive extensive calcification, ef 69%)   Hypothyroidism    Hypothyroidism, postradioiodine therapy    followed by pcp   Liver cirrhosis secondary to NASH (nonalcoholic steatohepatitis) ([place])    followed by dr Merrilee Jansky ([facility])---- dx 2004,  compensated ,  last liver bx (03/ 2015) fibrosis stage 3;  moderate portal hypertensive gastrophy   PAF (paroxysmal atrial fibrillation) ([place]) previous cardiologist lov note w/ Suann Larry PA ([facility] in [place]) dated 01-24-2020 in care everywhere;  (04-01-2021 per pt has appt w/ new cardiologist , dr j. branch 04-30-2021)   first dx 2020--  had event monitor/ stress test/ echo all results in care everywhere (monitor 08-03-2019 short runs AFib conversion pause <2 secconds, rate controlled;  echo 12-14-2019 ef 60%, mild concentric LVH, G2DD, RSVP 50.45mHg)   Personal history of radiation therapy 2013   left breast cancer,  completed 02/ 2014   PONV (postoperative nausea and vomiting)    Sarcoidosis, lung ([facility] pulmonology--- dr sHalford Chessman  04-01-2021  per pt dx age [age]s, no issue since   Thrombocytopenia ([facility]    hematology/ oncology--- dr s. kDelton Coombes  VIN III (vulvar intraepithelial neoplasia III)     Surgical History: Past Surgical History:  Procedure Laterality Date   ABDOMINAL HYSTERECTOMY  1989   W/  UNILATERAL SALPINGOOPHORECTOMY   APPENDECTOMY  1989   BREAST SURGERY  09/15/2012   CATARACT EXTRACTION W/ INTRAOCULAR LENS  IMPLANT, BILATERAL  2016   CHOLECYSTECTOMY OPEN  9179   CO2 LASER APPLICATION N/A 15/04/6978   Procedure: CO2 LASER APPLICATION OF THE VULVA;  Surgeon: Everitt Amber, MD;  Location: [place];  Service: Gynecology;  Laterality: N/A;   COLONOSCOPY WITH ESOPHAGOGASTRODUODENOSCOPY (EGD)  last one 08/ 2018  '@[facility]'$    EYE SURGERY  2015   Cataract removed both eyes   LUMBAR LAMINECTOMY  2009    PARTIAL MASTECTOMY WITH AXILLARY SENTINEL LYMPH NODE BIOPSY Left 09-15-2012   [facility] [place]   TUBAL LIGATION  08/08/1969    Home Medications:  Allergies as of 06/16/2022       Reactions   Dicyclomine Other (See Comments)   Dry cough   Glucophage [metformin] Diarrhea   Losartan Other (See Comments)   Heartburn        Medication List        Accurate as of June 16, 2022  1:57 PM. If you have any questions, ask your nurse or doctor.          atorvastatin 20 MG tablet Commonly known as: LIPITOR TAKE (1) TABLET BY MOUTH ONCE DAILY. What changed: See the new instructions.   Cholecalciferol 50 MCG (2000 UT) Caps Take 1 capsule (2,000 Units total) by mouth daily.   clobetasol ointment 0.05 % Commonly known as: TEMOVATE Apply topically at bedtime.   diltiazem 120 MG 24 hr capsule Commonly known as: TIAZAC Take 120 mg by mouth daily.   diltiazem 120 MG 24 hr capsule Commonly known as: CARDIZEM CD TAKE (1) CAPSULE BY MOUTH ONCE DAILY.   Eliquis 5 MG Tabs tablet Generic drug: apixaban TAKE (1) TABLET BY MOUTH TWICE DAILY. What changed: See the new instructions.   famotidine 20 MG tablet Commonly known as: Pepcid Take 1 tablet (20 mg total) by mouth 2 (two) times daily as needed for heartburn or indigestion.   hyoscyamine 0.125 MG tablet Commonly known as: Levsin Take 1 tablet (0.125 mg total) by mouth every 6 (six) hours as needed for cramping.   levothyroxine 100 MCG tablet Commonly known as: SYNTHROID Take 1 tablet (100 mcg total) by mouth daily before breakfast.   lisinopril-hydrochlorothiazide 20-25 MG tablet Commonly known as: ZESTORETIC TAKE (1) TABLET BY MOUTH EACH MORNING.   mirabegron ER 25 MG Tb24 tablet Commonly known as: MYRBETRIQ Take 1 tablet (25 mg total) by mouth daily.   mirtazapine 7.5 MG tablet Commonly known as: REMERON Take 1 tablet (7.5 mg total) by mouth at bedtime.   nitroGLYCERIN 0.4 MG SL tablet Commonly known as: NITROSTAT Place 1  tablet (0.4 mg total) under the tongue every 5 (five) minutes as needed for chest pain.   solifenacin 5 MG tablet Commonly known as: VESICARE Take 1 tablet (5 mg total) by mouth daily.   ursodiol 250 MG tablet Commonly known as: ACTIGALL Take 250 mg by mouth 2 (two) times daily.   vitamin B-12 500 MCG tablet Commonly known as: CYANOCOBALAMIN Take 500 mcg by mouth daily.        Allergies:  Allergies  Allergen Reactions   Dicyclomine Other (See Comments)    Dry cough   Glucophage [Metformin] Diarrhea   Losartan Other (See Comments)    Heartburn     Family History: Family History  Problem Relation Age of Onset  Hypertension Mother    Dementia Mother    Vision loss Mother    Heart disease Father    Hearing loss Father    Stroke Maternal Grandfather    Heart disease Brother     Social History:  reports that she has never smoked. She has never used smokeless tobacco. She reports that she does not currently use alcohol. She reports that she does not use drugs.  ROS: All other review of systems were reviewed and are negative except what is noted above in HPI  Physical Exam: BP (!) 86/50   Pulse 75   Constitutional:  Alert and oriented, No acute distress. HEENT: [place] AT, moist mucus membranes.  Trachea midline, no masses. Cardiovascular: No clubbing, cyanosis, or edema. Respiratory: Normal respiratory effort, no increased work of breathing. GI: Abdomen is soft, nontender, nondistended, no abdominal masses GU: No CVA tenderness.  Lymph: No cervical or inguinal lymphadenopathy. Skin: No rashes, bruises or suspicious lesions. Neurologic: Grossly intact, no focal deficits, moving all 4 extremities. Psychiatric: Normal mood and affect.  Laboratory Data: Lab Results  Component Value Date   WBC 6.7 04/07/2022   HGB 12.6 04/07/2022   HCT 36.4 04/07/2022   MCV 89 04/07/2022   PLT 171 04/07/2022    Lab Results  Component Value Date   CREATININE 0.99 05/09/2022     No results found for: "PSA"  No results found for: "TESTOSTERONE"  Lab Results  Component Value Date   HGBA1C 5.4 04/07/2022    Urinalysis    Component Value Date/Time   COLORURINE YELLOW 03/27/2022 1555   APPEARANCEUR Clear 05/16/2022 1134   LABSPEC 1.013 03/27/2022 1555   PHURINE 6.0 03/27/2022 1555   GLUCOSEU Negative 05/16/2022 1134   HGBUR MODERATE (A) 03/27/2022 1555   BILIRUBINUR Negative 05/16/2022 1134   KETONESUR NEGATIVE 03/27/2022 1555   PROTEINUR Negative 05/16/2022 1134   PROTEINUR NEGATIVE 03/27/2022 1555   UROBILINOGEN 0.2 08/13/2021 0853   NITRITE Negative 05/16/2022 1134   NITRITE NEGATIVE 03/27/2022 1555   LEUKOCYTESUR Trace (A) 05/16/2022 1134   LEUKOCYTESUR SMALL (A) 03/27/2022 1555    Lab Results  Component Value Date   LABMICR See below: 05/16/2022   WBCUA 0-5 05/16/2022   LABEPIT >10 (A) 05/16/2022   MUCUS Present (A) 04/16/2022   BACTERIA Moderate (A) 05/16/2022    Pertinent Imaging:  No results found for this or any previous visit.  No results found for this or any previous visit.  No results found for this or any previous visit.  No results found for this or any previous visit.  Results for orders placed during the hospital encounter of 05/14/22  Ultrasound renal complete  Narrative CLINICAL DATA:  nephrolithiasis  EXAM: RENAL / URINARY TRACT ULTRASOUND COMPLETE  COMPARISON:  CT dated March 27, 2022  FINDINGS: Right Kidney:  Renal measurements: 9.8 x 3.7 x 5.3 cm = volume: 99 mL. Echogenicity within normal limits. No mass visualized. There is mild pelviectasis. Degree of pelviectasis is similar in comparison to prior CT scan.  Left Kidney:  Limited assessment secondary to suboptimal acoustic windows. Renal measurements: 8.7 x 4.1 x 3.7 cm = volume: 69 mL. Echogenicity within normal limits. No mass visualized. Previously described prominence of the renal collecting system has decreased in comparison to  prior.  Bladder:  Not visualized secondary to shadowing bowel gas.  Other:  None.  IMPRESSION: 1. Decreased prominence of the LEFT-sided renal collecting system in comparison to prior. 2. Mild RIGHT-sided pelviectasis, morphologically similar in comparison to  prior CT scan.   Electronically Signed By: Valentino Saxon M.D. On: 05/15/2022 14:22  No results found for this or any previous visit.  No results found for this or any previous visit.  No results found for this or any previous visit.   Assessment & Plan:    1. Acute cystitis with hematuria -urine for culture - Urinalysis, Routine w reflex microscopic  2. Urinary incontinence, unspecified type -We will trial detrol '4mg'$  LA. We also discussed PTNS, botox and interstim.    No follow-ups on file.  Nicolette Bang, MD  [facility]rology [place]

## 2022-06-19 ENCOUNTER — Other Ambulatory Visit: Payer: Self-pay | Admitting: Physician Assistant

## 2022-06-19 ENCOUNTER — Telehealth: Payer: Self-pay

## 2022-06-19 ENCOUNTER — Other Ambulatory Visit: Payer: Self-pay

## 2022-06-19 ENCOUNTER — Other Ambulatory Visit (HOSPITAL_COMMUNITY): Payer: Medicare Other

## 2022-06-19 LAB — URINE CULTURE

## 2022-06-19 MED ORDER — NITROFURANTOIN MONOHYD MACRO 100 MG PO CAPS: 100.0000 mg | ORAL_CAPSULE | Freq: Two times a day (BID) | ORAL | 0 refills | Status: DC

## 2022-06-19 MED ORDER — SULFAMETHOXAZOLE-TRIMETHOPRIM 800-160 MG PO TABS: 1.0000 | ORAL_TABLET | Freq: Two times a day (BID) | ORAL | 0 refills | Status: DC

## 2022-06-19 NOTE — Telephone Encounter (Signed)
-----   Message from Reynaldo Minium, [place] sent at 06/19/2022  4:01 PM EDT ----- Please let pt know her urine culture is positive and prescription has been sent to her pharmacy.  ----- Message ----- From: Iris Pert, LPN Sent: 3/33/8329  11:35 AM EDT To: Cleon Gustin, MD; #  No tx started

## 2022-06-19 NOTE — Telephone Encounter (Signed)
Left voicemail informing pt of positive results and rx has been sent to her pharmacy.  Asked patient to return our call for more details concerning her abx.

## 2022-06-19 NOTE — Progress Notes (Signed)
Urine culture reviewed and treatment initiated with Bactrim DS twice daily.

## 2022-06-23 ENCOUNTER — Other Ambulatory Visit (HOSPITAL_COMMUNITY): Payer: Medicare Other

## 2022-06-23 LAB — HM DIABETES EYE EXAM

## 2022-06-24 ENCOUNTER — Inpatient Hospital Stay (HOSPITAL_COMMUNITY): Payer: Medicare Other | Attending: Hematology

## 2022-06-24 DIAGNOSIS — K7460 Unspecified cirrhosis of liver: Secondary | ICD-10-CM | POA: Insufficient documentation

## 2022-06-24 DIAGNOSIS — E785 Hyperlipidemia, unspecified: Secondary | ICD-10-CM | POA: Insufficient documentation

## 2022-06-24 DIAGNOSIS — E039 Hypothyroidism, unspecified: Secondary | ICD-10-CM | POA: Diagnosis not present

## 2022-06-24 DIAGNOSIS — I10 Essential (primary) hypertension: Secondary | ICD-10-CM | POA: Insufficient documentation

## 2022-06-24 DIAGNOSIS — Z8249 Family history of ischemic heart disease and other diseases of the circulatory system: Secondary | ICD-10-CM | POA: Diagnosis not present

## 2022-06-24 DIAGNOSIS — G479 Sleep disorder, unspecified: Secondary | ICD-10-CM | POA: Diagnosis not present

## 2022-06-24 DIAGNOSIS — Z822 Family history of deafness and hearing loss: Secondary | ICD-10-CM | POA: Diagnosis not present

## 2022-06-24 DIAGNOSIS — Z79890 Hormone replacement therapy (postmenopausal): Secondary | ICD-10-CM | POA: Insufficient documentation

## 2022-06-24 DIAGNOSIS — D509 Iron deficiency anemia, unspecified: Secondary | ICD-10-CM | POA: Diagnosis present

## 2022-06-24 DIAGNOSIS — Z821 Family history of blindness and visual loss: Secondary | ICD-10-CM | POA: Insufficient documentation

## 2022-06-24 DIAGNOSIS — R42 Dizziness and giddiness: Secondary | ICD-10-CM | POA: Diagnosis not present

## 2022-06-24 DIAGNOSIS — Z823 Family history of stroke: Secondary | ICD-10-CM | POA: Insufficient documentation

## 2022-06-24 DIAGNOSIS — R32 Unspecified urinary incontinence: Secondary | ICD-10-CM | POA: Diagnosis not present

## 2022-06-24 DIAGNOSIS — R161 Splenomegaly, not elsewhere classified: Secondary | ICD-10-CM | POA: Insufficient documentation

## 2022-06-24 DIAGNOSIS — R519 Headache, unspecified: Secondary | ICD-10-CM | POA: Diagnosis not present

## 2022-06-24 DIAGNOSIS — Z90721 Acquired absence of ovaries, unilateral: Secondary | ICD-10-CM | POA: Insufficient documentation

## 2022-06-24 DIAGNOSIS — F32A Depression, unspecified: Secondary | ICD-10-CM | POA: Insufficient documentation

## 2022-06-24 DIAGNOSIS — Z79899 Other long term (current) drug therapy: Secondary | ICD-10-CM | POA: Diagnosis not present

## 2022-06-24 DIAGNOSIS — R5383 Other fatigue: Secondary | ICD-10-CM | POA: Diagnosis not present

## 2022-06-24 DIAGNOSIS — Z9049 Acquired absence of other specified parts of digestive tract: Secondary | ICD-10-CM | POA: Diagnosis not present

## 2022-06-24 DIAGNOSIS — D696 Thrombocytopenia, unspecified: Secondary | ICD-10-CM

## 2022-06-24 DIAGNOSIS — Z7901 Long term (current) use of anticoagulants: Secondary | ICD-10-CM | POA: Diagnosis not present

## 2022-06-24 DIAGNOSIS — G2581 Restless legs syndrome: Secondary | ICD-10-CM | POA: Insufficient documentation

## 2022-06-24 DIAGNOSIS — I480 Paroxysmal atrial fibrillation: Secondary | ICD-10-CM | POA: Diagnosis not present

## 2022-06-24 DIAGNOSIS — D500 Iron deficiency anemia secondary to blood loss (chronic): Secondary | ICD-10-CM

## 2022-06-24 DIAGNOSIS — K7581 Nonalcoholic steatohepatitis (NASH): Secondary | ICD-10-CM | POA: Diagnosis not present

## 2022-06-24 LAB — CBC WITH DIFFERENTIAL/PLATELET
Abs Immature Granulocytes: 0.01 10*3/uL (ref 0.00–0.07)
Basophils Absolute: 0 10*3/uL (ref 0.0–0.1)
Basophils Relative: 0 %
Eosinophils Absolute: 0.1 10*3/uL (ref 0.0–0.5)
Eosinophils Relative: 2 %
HCT: 24.8 % — ABNORMAL LOW (ref 36.0–46.0)
Hemoglobin: 8.2 g/dL — ABNORMAL LOW (ref 12.0–15.0)
Immature Granulocytes: 0 %
Lymphocytes Relative: 23 %
Lymphs Abs: 1 10*3/uL (ref 0.7–4.0)
MCH: 30.9 pg (ref 26.0–34.0)
MCHC: 33.1 g/dL (ref 30.0–36.0)
MCV: 93.6 fL (ref 80.0–100.0)
Monocytes Absolute: 0.4 10*3/uL (ref 0.1–1.0)
Monocytes Relative: 8 %
Neutro Abs: 2.9 10*3/uL (ref 1.7–7.7)
Neutrophils Relative %: 67 %
Platelets: 99 10*3/uL — ABNORMAL LOW (ref 150–400)
RBC: 2.65 MIL/uL — ABNORMAL LOW (ref 3.87–5.11)
RDW: 14.6 % (ref 11.5–15.5)
WBC: 4.4 10*3/uL (ref 4.0–10.5)
nRBC: 0 % (ref 0.0–0.2)

## 2022-06-24 LAB — IRON AND TIBC
Iron: 51 ug/dL (ref 28–170)
Saturation Ratios: 19 % (ref 10.4–31.8)
TIBC: 267 ug/dL (ref 250–450)
UIBC: 216 ug/dL

## 2022-06-24 LAB — FERRITIN: Ferritin: 68 ng/mL (ref 11–307)

## 2022-06-26 ENCOUNTER — Ambulatory Visit (HOSPITAL_COMMUNITY): Payer: Medicare Other | Admitting: Physician Assistant

## 2022-06-30 ENCOUNTER — Inpatient Hospital Stay (HOSPITAL_BASED_OUTPATIENT_CLINIC_OR_DEPARTMENT_OTHER): Payer: Medicare Other | Admitting: Physician Assistant

## 2022-06-30 VITALS — BP 111/41 | HR 63 | Temp 98.3°F | Resp 18 | Ht 63.0 in | Wt 131.3 lb

## 2022-06-30 DIAGNOSIS — D696 Thrombocytopenia, unspecified: Secondary | ICD-10-CM | POA: Diagnosis not present

## 2022-06-30 DIAGNOSIS — D500 Iron deficiency anemia secondary to blood loss (chronic): Secondary | ICD-10-CM | POA: Diagnosis not present

## 2022-06-30 DIAGNOSIS — D509 Iron deficiency anemia, unspecified: Secondary | ICD-10-CM | POA: Diagnosis not present

## 2022-06-30 DIAGNOSIS — Z853 Personal history of malignant neoplasm of breast: Secondary | ICD-10-CM | POA: Diagnosis not present

## 2022-06-30 DIAGNOSIS — E538 Deficiency of other specified B group vitamins: Secondary | ICD-10-CM | POA: Diagnosis not present

## 2022-06-30 NOTE — Progress Notes (Signed)
Tasha Roberts, Tasha Roberts   CLINIC:  Medical Oncology/Hematology  PCP:  Lindell Spar, MD [address] 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  She was last seen by Tarri Abernethy PA-C on 03/24/2022.  At today's visit, she reports feeling fatigued and worn down.  ***MULT CHRONIC ISSUES *** She has not had any recent hospitalizations, surgeries, or changes in baseline health status.   *** She continues to take Eliquis for atrial fibrillation.  *** CARDIOLOGIST WANTS HER ON WATCHMAN DEVICE INSTEAD *** She denies any epistaxis, hematemesis, hematochezia, or other major bleeding events. - BMs have been black for a few months, taking iron pill and B12 500 mcg *** She does have significant bruising and reports that she will bruise with the slightest provocation. *** She denies any petechial rash. *** She continues to have significant fatigue, which was unimproved after most recent IV iron. *** She has occasional restless legs at night, occasional headaches, and intermittent episodes of lightheadedness. *** She denies any pica, chest pain, dyspnea on exertion, or syncope. *** She denies any B symptoms such as fever, chills, night sweats, unintentional weight loss.  She has  *** % energy and  *** appetite. She endorses that she is maintaining a stable weight. ***    REVIEW OF SYSTEMS:  ***  Review of Systems  Constitutional:  Positive for appetite change and fatigue. Negative for chills, diaphoresis, fever and unexpected weight change.  HENT:    Negative for lump/mass and nosebleeds.   Eyes:  Negative for eye problems.  Respiratory:  Negative for cough, hemoptysis and shortness of breath.   Cardiovascular:  Negative for chest pain, leg swelling and palpitations.  Gastrointestinal:  Positive for constipation. Negative for abdominal pain, blood in stool, diarrhea, nausea and vomiting.  Genitourinary:  Positive for bladder incontinence. Negative for hematuria.   Musculoskeletal:  Positive for arthralgias and back pain.  Skin: Negative.   Neurological:  Negative for dizziness, headaches and light-headedness.  Hematological:  Does not bruise/bleed easily.  Psychiatric/Behavioral:  Positive for depression and sleep disturbance. The patient is nervous/anxious.       PAST MEDICAL/SURGICAL HISTORY:  Past Medical History:  Diagnosis Date   Anemia due to blood loss    iron infusion's last one 03-25-2021 (pt bleeding post vulvar bx 02-11-2021, hospiatal admission 02-21-2021)   Anticoagulant long-term use    eliquis--- managed by pcp    Arthritis    hands   Bronchiectasis [facility])    pulmonology --- dr Halford Chessman   (04-01-2021  pt stated no supplemental oxygen use since 01/ 2022 and no inhaler use,  stated checks O2 at home during the day 98-99%)   Cancer ([place]) 2013   Lumpectomy left breast   Cataract 2015   Chronic constipation    Clotting disorder ([place])    On eliquis   Coronary artery calcification    per lexiscan 08-17-2019 result in care everywhere   Depression, major, single episode, moderate ([place]) 12/05/2021   DM II (diabetes mellitus, type II), controlled ([place]) 08/27/2005   DOE (dyspnea on exertion)    04-01-2021  per pt only going up stairs    Gastroesophageal reflux disease 12/05/2021   History of diabetes mellitus, type II    04-01-2021  per pt no issues after weight loss, last taken medication approx 2017   History of Graves' disease 2017   s/p RAI   History of left breast cancer    dx 2013,  s/p left partial mastecotmy w/  node dissection 09-15-2012, low grade DCIS, no chemo, completed radiation 02/ 2014,  per pt no recurrence   History of respiratory syncytial virus (RSV) infection 09/2020   w/ hypoxia  , hospital admission 10/ 2021  and acute exacerbation bronchiectasis;  follow up post hospital pulmonogy dr Halford Chessman  note in epic   Hyperlipidemia    Hypertension    followed by pcp   (pt had lexiscan in care everywhere dated 08-17-2019 no ischemia , non-obstructive extensive calcification, ef 69%)   Hypothyroidism    Hypothyroidism, postradioiodine therapy    followed by pcp   Liver cirrhosis secondary to NASH (nonalcoholic steatohepatitis) ([place])    followed by dr Merrilee Jansky ([facility])---- dx 2004,  compensated ,  last liver bx (03/ 2015) fibrosis stage 3;  moderate portal hypertensive gastrophy   PAF (paroxysmal atrial fibrillation) ([place]) previous cardiologist lov note w/ Suann Larry PA ([facility] in [place]) dated 01-24-2020 in care everywhere;  (04-01-2021 per pt has appt w/ new cardiologist , dr j. branch 04-30-2021)   first dx 2020--  had event monitor/ stress test/ echo all results in care everywhere (monitor 08-03-2019 short runs AFib conversion pause <2 secconds, rate controlled;  echo 12-14-2019 ef 60%, mild concentric LVH, G2DD, RSVP 50.67mHg)   Personal history of radiation therapy 2013   left breast cancer,  completed 02/ 2014   PONV (postoperative nausea and vomiting)    Sarcoidosis, lung ([facility] pulmonology--- dr sHalford Chessman  04-01-2021  per pt dx age [age]s, no issue since   Thrombocytopenia ([facility]    hematology/ oncology--- dr s. kDelton Coombes  VIN III (vulvar intraepithelial neoplasia III)    Past Surgical History:  Procedure Laterality Date   ABDOMINAL HYSTERECTOMY  1989   W/  UNILATERAL SALPINGOOPHORECTOMY   APPENDECTOMY  1989   BREAST SURGERY  09/15/2012   CATARACT EXTRACTION W/ INTRAOCULAR LENS  IMPLANT, BILATERAL  2016   CHOLECYSTECTOMY OPEN  14034  CO2 LASER APPLICATION  N/A [id number]  Procedure: CO2 LASER APPLICATION OF THE VULVA;  Surgeon: REveritt Amber MD;  [facility] [facility]  Service: Gynecology;  Laterality: N/A;   COLONOSCOPY WITH ESOPHAGOGASTRODUODENOSCOPY (EGD)  last one 08/ 2018  '@[facility]'$    EYE SURGERY  2015   Cataract removed both eyes   LUMBAR LAMINECTOMY  2009   PARTIAL MASTECTOMY WITH AXILLARY SENTINEL LYMPH NODE BIOPSY [facility]15-2012   [facility] [place]  TUBAL LIGATION  08/08/1969     SOCIAL HISTORY:  Social History   Socioeconomic History   Marital status: Widowed    Spouse name: Not on file   Number of children: 3   Years of education: Not on file   Highest education level: Not on file  Occupational History   Occupation: retired  Tobacco Use   Smoking status: Never   Smokeless tobacco: Never  Vaping Use   Vaping Use: Never used  Substance and Sexual Activity   Alcohol use: Not Currently   Drug use: Never   Sexual activity: Not Currently    Birth control/protection: Post-menopausal  Other Topics Concern  Not on file  Social History Narrative   Not on file   Social Determinants of Health   Financial Resource Strain: Low Risk  (08/10/2021)   Overall Financial Resource Strain (CARDIA)    Difficulty of Paying Living Expenses: Not hard at all  Food Insecurity: No Food Insecurity (08/10/2021)   Hunger Vital Sign    Worried About Running Out of Food in the Last Year: Never true    [place] in the Last Year: Never true  Transportation Needs: No Transportation Needs (08/10/2021)   PRAPARE - Hydrologist (Medical): No    Lack of Transportation (Non-Medical): No  Physical Activity: Insufficiently Active (08/10/2021)   Exercise Vital Sign    Days of Exercise per Week: 3 days    Minutes of Exercise per Session: 30 min  Stress: No Stress Concern Present (08/10/2021)   [place]    Feeling of Stress : Not at all  Social  Connections: Moderately Isolated (08/10/2021)   Social Connection and Isolation Panel [NHANES]    Frequency of Communication with Friends and Family: More than three times a week    Frequency of Social Gatherings with Friends and Family: More than three times a week    Attends Religious Services: More than 4 times per year    Active Member of [employer] or Organizations: No    Attends Archivist Meetings: Never    Marital Status: Widowed  Intimate Partner Violence: Not At Risk (08/10/2021)   Humiliation, Afraid, Rape, and Kick questionnaire    Fear of Current or Ex-Partner: No    Emotionally Abused: No    Physically Abused: No    Sexually Abused: No    FAMILY HISTORY:  Family History  Problem Relation Age of Onset   Hypertension Mother    Dementia Mother    Vision loss Mother    Heart disease Father    Hearing loss Father    Stroke Maternal Grandfather    Heart disease Brother     CURRENT MEDICATIONS:  Outpatient Encounter Medications as of 06/30/2022  Medication Sig   apixaban (ELIQUIS) 5 MG TABS tablet TAKE (1) TABLET BY MOUTH TWICE DAILY. (Patient taking differently: Take 5 mg by mouth 2 (two) times daily.)   atorvastatin (LIPITOR) 20 MG tablet TAKE (1) TABLET BY MOUTH ONCE DAILY. (Patient taking differently: Take 20 mg by mouth daily.)   Cholecalciferol 50 MCG (2000 UT) CAPS Take 1 capsule (2,000 Units total) by mouth daily.   clobetasol ointment (TEMOVATE) 0.05 % Apply topically at bedtime.   diltiazem (CARDIZEM CD) 120 MG 24 hr capsule TAKE (1) CAPSULE BY MOUTH ONCE DAILY.   diltiazem (TIAZAC) 120 MG 24 hr capsule Take 120 mg by mouth daily.   famotidine (PEPCID) 20 MG tablet Take 1 tablet (20 mg total) by mouth 2 (two) times daily as needed for heartburn or indigestion.   hyoscyamine (LEVSIN) 0.125 MG tablet Take 1 tablet (0.125 mg total) by mouth every 6 (six) hours as needed for cramping.   levothyroxine (SYNTHROID) 100 MCG tablet Take 1 tablet (100 mcg total) by mouth  daily before breakfast.   lisinopril-hydrochlorothiazide (ZESTORETIC) 20-25 MG tablet TAKE (1) TABLET BY MOUTH EACH MORNING.   mirabegron ER (MYRBETRIQ) 25 MG TB24 tablet Take 1 tablet (25 mg total) by mouth daily.   mirtazapine (REMERON) 7.5 MG tablet Take 1 tablet (7.5 mg total) by mouth at bedtime.   nitroGLYCERIN (NITROSTAT)  0.4 MG SL tablet Place 1 tablet (0.4 mg total) under the tongue every 5 (five) minutes as needed for chest pain.   solifenacin (VESICARE) 5 MG tablet Take 1 tablet (5 mg total) by mouth daily.   sulfamethoxazole-trimethoprim (BACTRIM DS) 800-160 MG tablet Take 1 tablet by mouth 2 (two) times daily.   tolterodine (DETROL LA) 4 MG 24 hr capsule Take 1 capsule (4 mg total) by mouth daily.   ursodiol (ACTIGALL) 250 MG tablet Take 250 mg by mouth 2 (two) times daily.   vitamin B-12 (CYANOCOBALAMIN) 500 MCG tablet Take 500 mcg by mouth daily.   Facility-Administered Encounter Medications as of 06/30/2022  Medication   0.9 %  sodium chloride infusion   0.9 %  sodium chloride infusion    ALLERGIES:  Allergies  Allergen Reactions   Dicyclomine Other (See Comments)    Dry cough   Glucophage [Metformin] Diarrhea   Losartan Other (See Comments)    Heartburn      PHYSICAL EXAM:  ***  ECOG PERFORMANCE STATUS: 2 - Symptomatic, <50% confined to bed  There were no vitals filed for this visit. There were no vitals filed for this visit. Physical Exam Constitutional:      Appearance: Normal appearance.  HENT:     Head: Normocephalic and atraumatic.     Mouth/Throat:     Mouth: Mucous membranes are moist.  Eyes:     Extraocular Movements: Extraocular movements intact.     Pupils: Pupils are equal, Tasha, and reactive to light.  Cardiovascular:     Rate and Rhythm: Bradycardia present. Rhythm irregular.     Pulses: Normal pulses.     Heart sounds: Normal heart sounds.  Pulmonary:     Effort: Pulmonary effort is normal.     Breath sounds: Normal breath sounds.   Abdominal:     General: Bowel sounds are normal.     Palpations: Abdomen is soft.     Tenderness: There is no abdominal tenderness.  Musculoskeletal:        General: No swelling.     Right lower leg: No edema.     Left lower leg: No edema.  Lymphadenopathy:     Cervical: No cervical adenopathy.  Skin:    General: Skin is warm and dry.     Findings: Bruising (significant bruising of all four extremities) present.     Comments: Hyperpigmentation of bilateral lower extremities  Neurological:     General: No focal deficit present.     Mental Status: She is alert and oriented to person, place, and time.  Psychiatric:        Mood and Affect: Mood normal.        Behavior: Behavior normal.      LABORATORY DATA:  I have reviewed the labs as listed.  CBC    Component Value Date/Time   WBC 4.4 06/24/2022 1158   RBC 2.65 (L) 06/24/2022 1158   HGB 8.2 (L) 06/24/2022 1158   HGB 12.6 04/07/2022 1515   HCT 24.8 (L) 06/24/2022 1158   HCT 36.4 04/07/2022 1515   PLT 99 (L) 06/24/2022 1158   PLT 171 04/07/2022 1515   MCV 93.6 06/24/2022 1158   MCV 89 04/07/2022 1515   MCH 30.9 06/24/2022 1158   MCHC 33.1 06/24/2022 1158   RDW 14.6 06/24/2022 1158   RDW 13.6 04/07/2022 1515   LYMPHSABS 1.0 06/24/2022 1158   LYMPHSABS 1.3 04/07/2022 1515   MONOABS 0.4 06/24/2022 1158   EOSABS 0.1 06/24/2022 1158  EOSABS 0.2 04/07/2022 1515   BASOSABS 0.0 06/24/2022 1158   BASOSABS 0.0 04/07/2022 1515      Latest Ref Rng & Units 05/09/2022   12:05 PM 04/07/2022    3:15 PM 03/30/2022    4:42 AM  CMP  Glucose 70 - 99 mg/dL 84  101  83   BUN 8 - 27 mg/dL '26  23  15   '$ Creatinine 0.57 - 1.00 mg/dL 0.99  0.99  0.79   Sodium 134 - 144 mmol/L 145  145  140   Potassium 3.5 - 5.2 mmol/L 4.2  3.7  3.7   Chloride 96 - 106 mmol/L 106  105  113   CO2 20 - 29 mmol/L '22  22  22   '$ Calcium 8.7 - 10.3 mg/dL 9.5  9.2  8.5     DIAGNOSTIC IMAGING:  I have independently reviewed the relevant imaging and discussed  with the patient.   ASSESSMENT & PLAN: 1.  Thrombocytopenia, mild to moderate - Ongoing problem since at least 2016 (platelets 74) - has been ranging from mild to moderate thrombocytopenia for the past 5 years - Likely secondary to NASH cirrhosis and splenomegaly - CT [facility] showed cirrhosis of the liver without evidence of HCC; splenomegaly and small caliber upper abdominal venous collaterals were noted - Work-up to rule out other causes of thrombocytopenia was unremarkable:  Vitamin D normal at 57.86, copper slightly decreased at 77, methylmalonic acid normal 298; hepatitis panel and HIV nonreactive ANA and rheumatoid factor normal. Recently checked (1/69/4503) folic acid and U88 were within normal  - Most recent labs (06/24/2022): Platelets 99 - Admits to easy bruising, but denies petechial rash   ***  - PLAN: No indication for treatment at this time.  Continue to monitor with repeat CBC and RTC in 3 months. *** - *** We will recheck copper next visit, along with ceruloplasmin levels.     2.  Normocytic anemia - Likely secondary to longstanding chronic disease, occult GI blood loss, and recent postoperative bleeding after vulvar biopsy while on Eliquis - Most recent EGD/[facility]oscopy ([facility]11/20[facility]) showed portal hypertensive gastropathy with normal esophagus and duodenum.  Colonoscopy unremarkable. - Episode of blood loss from postop bleeding in March 2022 following vulvar biopsy (02/11/2021) - resolved after laser cauterization - No history of blood transfusions - She is on Eliquis for atrial fibrillation   ***  - Work-up for other sources of anemia was negative - normal SPEP, normal light chain ratio, normal haptoglobin, appropriately elevated erythropoietin - Most recent IV iron (Venofer 300 mg x 3, last on 01/17/2022) - Reports intermittent melena for the past 2 to 3 years, last episode was a week ago and resolved within 24 hours.  ***  She follows with GI and has made  them aware of her melena.     ***  - She is symptomatic with significant fatigue   ***  - Most recent labs (06/24/2022): Hgb 8.2/MCV 93.6, ferritin 68, iron saturation 19% - She is taking iron supplement *** - PLAN: Recommend IV Venofer 300 mg x 3.  *** - Continue *** taking oral iron supplementation (ferrous sulfate 325 mg daily) - Continue to monitor with repeat labs and RTC in 3 months.   3.  Vitamin B12 deficiency - She had been taking vitamin B12 1000 mcg daily since January 2023. - Most recent labs (03/17/2022): B12 elevated at 2373, with normal methylmalonic acid (previously elevated at 711) - Decreased to vitamin B12  500 mcg daily since April 2023 *** - PLAN: Continue dose of vitamin B12 500 mcg daily.  ***  - We will recheck B12/methylmalonic acid in 3 months.   4.  Left breast DCIS: - Lumpectomy on 09/15/2012-DCIS 3 x 2 cm, negative for invasive malignancy, 0/2 sentinel lymph nodes positive.  ER/PR positive. - Completed XRT on 01/23/2013. - She does not recollect taking any tamoxifen or anastrazole - Bilateral diagnostic mammogram with left ultrasound on 08/02/2019 was benign. - Previously followed with the breast care center at [facility] in [place], [place] - recently relocated to [facility].  (External notes from NP Johnsie Kindred reviewed by me) - Mammogram (03/15/2021) was BI-RADS Category 2, benign, no mammographic evidence of malignancy - Mammogram ordered for this year, not yet obtained *** - Breast exam *** - PLAN: We will schedule patient for mammogram and we will review this at her next visit. - Breast exam was offered today, but patient wishes to defer to next visit.   5.  Social history - Patient is a lifelong non-smoker.  No tobacco or illicit drug use. - She ambulates with a Rollator due to generalized weakness. - She currently lives at home with her daughter.  Patient's husband passed away in 03/13/21 - Patient retired from work as a Arts administrator.   PLAN SUMMARY & DISPOSITION: Schedule mammogram *** IV Venofer 300 mg x 3 *** Labs in 3 months RTC after labs  All questions were answered. The patient knows to call the clinic with any problems, questions or concerns.  Medical decision making: Moderate ***   Time spent on visit: I spent  ***  minutes counseling the patient face to face. The total time spent in the appointment was  ***  minutes and more than 50% was on counseling.   Tasha Rush, PA-C   ***

## 2022-06-30 NOTE — Patient Instructions (Signed)
Tasha Roberts at [facility] Discharge Instructions  You were seen today by Tarri Abernethy PA-C for your low platelets (thrombocytopenia) and low blood count (iron deficiency anemia).    LOW PLATELETS: Your low platelets are related to your liver disease.  They are currently stable.  IRON DEFICIENCY ANEMIA: Your iron blood and iron levels are low.  I will schedule you for 3 doses of IV iron.  I would like to recheck your iron and blood labs again in 3 months.  B12 DEFICIENCY: Continue taking your B12 supplement 500 mcg daily.  HISTORY OF BREAST CANCER: We will schedule you for mammogram.  Your annual breast exam was completed today.  FOLLOW-UP APPOINTMENT: Return for follow-up with [name], PA-C in 3 months   - - - - - - - - - - - - - - - - - -    Thank you for choosing [facility] at [facility] to provide your oncology and hematology care.  To afford each patient quality time with our provider, please arrive at least 15 minutes before your scheduled appointment time.   If you have a lab appointment with the [facility] please come in thru the Main Entrance and check in at the main information desk.  You need to re-schedule your appointment should you arrive 10 or more minutes late.  We strive to give you quality time with our providers, and arriving late affects you and other patients whose appointments are after yours.  Also, if you no show three or more times for appointments you may be dismissed from the clinic at the providers discretion.     Again, thank you for choosing [facility].  Our hope is that these requests will decrease the amount of time that you wait before being seen by our physicians.       _____________________________________________________________  Should you have questions after your visit to [facility], please contact our office at [phone] and follow the prompts.  Our  office hours are 8:00 a.m. and 4:30 p.m. Monday - Friday.  Please note that voicemails left after 4:00 p.m. may not be returned until the following business day.  We are closed weekends and major holidays.  You do have access to a nurse 24-7, just call the main number to the clinic [phone] and do not press any options, hold on the line and a nurse will answer the phone.    For prescription refill requests, have your pharmacy contact our office and allow 72 hours.    Due to Covid, you will need to wear a mask upon entering the hospital. If you do not have a mask, a mask will be given to you at the Main Entrance upon arrival. For doctor visits, patients may have 1 support person age 79 or older with them. For treatment visits, patients can not have anyone with them due to social distancing guidelines and our immunocompromised population.

## 2022-07-01 ENCOUNTER — Encounter (HOSPITAL_COMMUNITY): Payer: Self-pay | Admitting: Hematology

## 2022-07-07 ENCOUNTER — Ambulatory Visit (HOSPITAL_COMMUNITY): Payer: Medicare Other

## 2022-07-09 ENCOUNTER — Ambulatory Visit: Payer: Medicare Other | Admitting: Urology

## 2022-07-10 ENCOUNTER — Ambulatory Visit (HOSPITAL_COMMUNITY)
Admission: RE | Admit: 2022-07-10 | Discharge: 2022-07-10 | Disposition: A | Discharge status: Home / Self Care | Payer: Medicare Other | Source: Ambulatory Visit | Attending: Physician Assistant | Admitting: Physician Assistant

## 2022-07-10 DIAGNOSIS — Z1231 Encounter for screening mammogram for malignant neoplasm of breast: Secondary | ICD-10-CM | POA: Insufficient documentation

## 2022-07-10 DIAGNOSIS — Z853 Personal history of malignant neoplasm of breast: Secondary | ICD-10-CM | POA: Insufficient documentation

## 2022-07-11 ENCOUNTER — Ambulatory Visit: Payer: Medicare Other | Admitting: Urology

## 2022-07-12 ENCOUNTER — Encounter (HOSPITAL_COMMUNITY): Payer: Self-pay

## 2022-07-12 ENCOUNTER — Emergency Department (HOSPITAL_COMMUNITY)
Admission: EM | Admit: 2022-07-12 | Discharge: 2022-07-12 | Disposition: A | Discharge status: Home / Self Care | Payer: Medicare Other | Attending: Emergency Medicine | Admitting: Emergency Medicine

## 2022-07-12 DIAGNOSIS — M545 Low back pain, unspecified: Secondary | ICD-10-CM | POA: Insufficient documentation

## 2022-07-12 DIAGNOSIS — Z7901 Long term (current) use of anticoagulants: Secondary | ICD-10-CM | POA: Diagnosis not present

## 2022-07-12 MED ORDER — KETOROLAC TROMETHAMINE 15 MG/ML IJ SOLN
15.0000 mg | Freq: Once | INTRAMUSCULAR | Status: AC
  Administered 2022-07-12: 15 mg via INTRAMUSCULAR
  Filled 2022-07-12: qty 1

## 2022-07-12 MED ORDER — LIDOCAINE 5 % EX PTCH: 1.0000 | MEDICATED_PATCH | CUTANEOUS | 0 refills | Status: DC

## 2022-07-12 MED ORDER — CYCLOBENZAPRINE HCL 10 MG PO TABS: 10.0000 mg | ORAL_TABLET | Freq: Two times a day (BID) | ORAL | 0 refills | Status: DC | PRN

## 2022-07-12 MED ORDER — LIDOCAINE 5 % EX PTCH
1.0000 | MEDICATED_PATCH | CUTANEOUS | Status: DC
  Administered 2022-07-12: 1 via TRANSDERMAL
  Filled 2022-07-12: qty 1

## 2022-07-12 NOTE — Discharge Instructions (Addendum)
Please take Flexeril and lidocaine patches as prescribed.  I was originally going to prescribe you naproxen but you cannot take this with your Eliquis.  These other medications will work great.  Like you to follow-up with your primary care doctor or spine doctor as needed.  Return to the emergency room if any worsening symptoms you might have.

## 2022-07-12 NOTE — ED Triage Notes (Signed)
Pt BIBA from home.Pt states she has been having lower back pain since night when bending over to pick up item. Pain worse on left. Hx of sciatica.

## 2022-07-12 NOTE — ED Provider Notes (Signed)
[facility] EMERGENCY DEPARTMENT Provider Note   CSN: [id number] Arrival date & time: 07/12/22  1140     History Chief Complaint  Patient presents with   Back Pain    Tasha Roberts is a 81 y.o. female with history of sciatica who presents to the emergency department with left lower back pain that started last night while she was at dinner with her daughter.  This started suddenly.  She states this feels similar to her sciatica.  Pain radiates down the buttock but not into the lower extremity.  No bowel incontinence.  Patient does state that she has urinary incontinence but this is not a new problem this has been chronic for some time.  No fever or chills.   Back Pain      Home Medications Prior to Admission medications   Medication Sig Start Date End Date Taking? Authorizing Provider  cyclobenzaprine (FLEXERIL) 10 MG tablet Take 1 tablet (10 mg total) by mouth 2 (two) times daily as needed for muscle spasms. 07/12/22  Yes Orva Gwaltney M, PA-C  lidocaine (LIDODERM) 5 % Place 1 patch onto the skin daily. Remove & Discard patch within 12 hours or as directed by MD 07/12/22  Yes Raul Del, Consuelo Thayne M, PA-C  apixaban (ELIQUIS) 5 MG TABS tablet TAKE (1) TABLET BY MOUTH TWICE DAILY. Patient taking differently: Take 5 mg by mouth 2 (two) times daily. 09/19/21   Arnoldo Lenis, MD  atorvastatin (LIPITOR) 20 MG tablet TAKE (1) TABLET BY MOUTH ONCE DAILY. Patient taking differently: Take 20 mg by mouth daily. 03/05/22   Lindell Spar, MD  Cholecalciferol 50 MCG (2000 UT) CAPS Take 1 capsule (2,000 Units total) by mouth daily. 03/30/22   Antonieta Pert, MD  clobetasol ointment (TEMOVATE) 0.05 % Apply topically at bedtime. 01/08/22   [provider]  diltiazem (CARDIZEM CD) 120 MG 24 hr capsule TAKE (1) CAPSULE BY MOUTH ONCE DAILY. 05/16/22   Arnoldo Lenis, MD  diltiazem (TIAZAC) 120 MG 24 hr capsule Take 120 mg by mouth daily.    [provider]  famotidine (PEPCID) 20 MG tablet  Take 1 tablet (20 mg total) by mouth 2 (two) times daily as needed for heartburn or indigestion. 12/05/21   Lindell Spar, MD  hyoscyamine (LEVSIN) 0.125 MG tablet Take 1 tablet (0.125 mg total) by mouth every 6 (six) hours as needed for cramping. 04/07/22   Lindell Spar, MD  levothyroxine (SYNTHROID) 100 MCG tablet Take 1 tablet (100 mcg total) by mouth daily before breakfast. 06/09/22   Lindell Spar, MD  lisinopril-hydrochlorothiazide (ZESTORETIC) 20-25 MG tablet TAKE (1) TABLET BY MOUTH EACH MORNING. 06/09/22   Lindell Spar, MD  mirabegron ER (MYRBETRIQ) 25 MG TB24 tablet Take 1 tablet (25 mg total) by mouth daily. 04/16/22   McKenzie, Candee Furbish, MD  mirtazapine (REMERON) 7.5 MG tablet Take 1 tablet (7.5 mg total) by mouth at bedtime. 12/05/21   Lindell Spar, MD  nitroGLYCERIN (NITROSTAT) 0.4 MG SL tablet Place 1 tablet (0.4 mg total) under the tongue every 5 (five) minutes as needed for chest pain. 07/31/21   Lindell Spar, MD  solifenacin (VESICARE) 5 MG tablet Take 1 tablet (5 mg total) by mouth daily. 05/16/22   McKenzie, Candee Furbish, MD  sulfamethoxazole-trimethoprim (BACTRIM DS) 800-160 MG tablet Take 1 tablet by mouth 2 (two) times daily. 06/19/22   Summerlin, Berneice Heinrich, PA-C  tolterodine (DETROL LA) 4 MG 24 hr capsule Take 1 capsule (4 mg total)  by mouth daily. 06/16/22   McKenzie, Candee Furbish, MD  ursodiol (ACTIGALL) 250 MG tablet Take 250 mg by mouth 2 (two) times daily.    [provider]  vitamin B-12 (CYANOCOBALAMIN) 500 MCG tablet Take 500 mcg by mouth daily.    [provider]      Allergies    Dicyclomine, Glucophage [metformin], and Losartan    Review of Systems   Review of Systems  Musculoskeletal:  Positive for back pain.  All other systems reviewed and are negative.   Physical Exam Updated Vital Signs BP (!) 110/41 (BP Location: Right Arm)   Pulse 66   Temp 98 F (36.7 C) (Oral)   Resp 18   Ht '5\' 3"'$  (1.6 m)   Wt 59.4 kg   SpO2 100%    BMI 23.21 kg/m  Physical Exam Vitals and nursing note reviewed.  Constitutional:      Appearance: Normal appearance.  HENT:     Head: Normocephalic and atraumatic.  Eyes:     General:        Right eye: No discharge.        Left eye: No discharge.     Conjunctiva/sclera: Conjunctivae normal.  Pulmonary:     Effort: Pulmonary effort is normal.  Musculoskeletal:     Comments: No midline tenderness of the lumbar spine.  There is point tenderness over the left paralumbar musculature into the left buttock.  Skin:    General: Skin is warm and dry.     Findings: No rash.  Neurological:     General: No focal deficit present.     Mental Status: She is alert.  Psychiatric:        Mood and Affect: Mood normal.        Behavior: Behavior normal.     ED Results / Procedures / Treatments   Labs (all labs ordered are listed, but only abnormal results are displayed) Labs Reviewed - No data to display  EKG None  Radiology No results found.  Procedures Procedures    Medications Ordered in ED Medications  lidocaine (LIDODERM) 5 % 1 patch (1 patch Transdermal Patch Applied 07/12/22 1254)  ketorolac (TORADOL) 15 MG/ML injection 15 mg (15 mg Intramuscular Given 07/12/22 1253)    ED Course/ Medical Decision Making/ A&P                           Medical Decision Making Tasha Roberts is a 81 y.o. female patient who presents to the emergency department today for further evaluation of lower back pain.  This seems to be consistent with possible sciatica or muscular spasm.  Do not feel that imaging is warranted at this time considering she is having no midline lumbar tenderness.  We will start with Toradol and Lidoderm patch and plan to reassess.  Patient feeling much better after Toradol and lidocaine patch.  Her range of motion is improved.  I will prescribe her naproxen and lidocaine patches to go home with.  Patient mental this plan.  I will have her follow-up with her primary care doctor or  spine doctor.  She is safe for discharge at this time.  Risk Prescription drug management.    Final Clinical Impression(s) / ED Diagnoses Final diagnoses:  Acute left-sided low back pain, unspecified whether sciatica present    Rx / DC Orders ED Discharge Orders          Ordered    lidocaine (LIDODERM) 5 %  Every 24 hours        07/12/22 1422    cyclobenzaprine (FLEXERIL) 10 MG tablet  2 times daily PRN        07/12/22 1422              Myna Bright [place], [place] 07/12/22 1424    Milton Ferguson, MD 07/12/22 1811

## 2022-07-14 ENCOUNTER — Other Ambulatory Visit (HOSPITAL_COMMUNITY): Payer: Self-pay | Admitting: Physician Assistant

## 2022-07-14 DIAGNOSIS — R928 Other abnormal and inconclusive findings on diagnostic imaging of breast: Secondary | ICD-10-CM

## 2022-07-17 ENCOUNTER — Ambulatory Visit: Payer: Medicare Other | Admitting: Nurse Practitioner

## 2022-07-21 ENCOUNTER — Ambulatory Visit (HOSPITAL_COMMUNITY): Payer: Medicare Other

## 2022-07-22 ENCOUNTER — Other Ambulatory Visit: Payer: Self-pay | Admitting: Neurosurgery

## 2022-07-22 ENCOUNTER — Other Ambulatory Visit (HOSPITAL_COMMUNITY): Payer: Self-pay | Admitting: Neurosurgery

## 2022-07-22 ENCOUNTER — Encounter: Payer: Self-pay | Admitting: *Deleted

## 2022-07-22 DIAGNOSIS — M5441 Lumbago with sciatica, right side: Secondary | ICD-10-CM

## 2022-07-22 NOTE — Progress Notes (Signed)
I called patient and she is confined to a wheelchair at this point and having a difficult time.  Expressed the importance of scheduling mammogram and missed iron infusions. She said she would call to schedule appointments next week.  Provider aware.

## 2022-07-23 ENCOUNTER — Other Ambulatory Visit: Payer: Self-pay

## 2022-07-23 ENCOUNTER — Encounter (HOSPITAL_COMMUNITY): Payer: Self-pay | Admitting: Emergency Medicine

## 2022-07-23 ENCOUNTER — Emergency Department (HOSPITAL_COMMUNITY)
Admission: EM | Admit: 2022-07-23 | Discharge: 2022-07-23 | Disposition: A | Discharge status: Home / Self Care | Payer: Medicare Other | Source: Home / Self Care | Attending: Emergency Medicine | Admitting: Emergency Medicine

## 2022-07-23 ENCOUNTER — Emergency Department (HOSPITAL_COMMUNITY): Payer: Medicare Other

## 2022-07-23 DIAGNOSIS — Z79899 Other long term (current) drug therapy: Secondary | ICD-10-CM | POA: Insufficient documentation

## 2022-07-23 DIAGNOSIS — Z7901 Long term (current) use of anticoagulants: Secondary | ICD-10-CM | POA: Insufficient documentation

## 2022-07-23 DIAGNOSIS — S32029A Unspecified fracture of second lumbar vertebra, initial encounter for closed fracture: Secondary | ICD-10-CM | POA: Insufficient documentation

## 2022-07-23 DIAGNOSIS — I480 Paroxysmal atrial fibrillation: Secondary | ICD-10-CM | POA: Insufficient documentation

## 2022-07-23 DIAGNOSIS — X58XXXA Exposure to other specified factors, initial encounter: Secondary | ICD-10-CM | POA: Insufficient documentation

## 2022-07-23 DIAGNOSIS — I10 Essential (primary) hypertension: Secondary | ICD-10-CM | POA: Insufficient documentation

## 2022-07-23 DIAGNOSIS — Z7984 Long term (current) use of oral hypoglycemic drugs: Secondary | ICD-10-CM | POA: Insufficient documentation

## 2022-07-23 DIAGNOSIS — I2510 Atherosclerotic heart disease of native coronary artery without angina pectoris: Secondary | ICD-10-CM | POA: Insufficient documentation

## 2022-07-23 DIAGNOSIS — S32020A Wedge compression fracture of second lumbar vertebra, initial encounter for closed fracture: Secondary | ICD-10-CM

## 2022-07-23 DIAGNOSIS — N390 Urinary tract infection, site not specified: Secondary | ICD-10-CM | POA: Diagnosis not present

## 2022-07-23 DIAGNOSIS — E119 Type 2 diabetes mellitus without complications: Secondary | ICD-10-CM | POA: Insufficient documentation

## 2022-07-23 DIAGNOSIS — I4891 Unspecified atrial fibrillation: Secondary | ICD-10-CM | POA: Diagnosis not present

## 2022-07-23 DIAGNOSIS — E039 Hypothyroidism, unspecified: Secondary | ICD-10-CM | POA: Insufficient documentation

## 2022-07-23 LAB — URINALYSIS, ROUTINE W REFLEX MICROSCOPIC
Bilirubin Urine: NEGATIVE
Glucose, UA: NEGATIVE mg/dL
Ketones, ur: NEGATIVE mg/dL
Nitrite: NEGATIVE
Protein, ur: NEGATIVE mg/dL
Specific Gravity, Urine: 1.016 (ref 1.005–1.030)
pH: 5 (ref 5.0–8.0)

## 2022-07-23 LAB — CBC WITH DIFFERENTIAL/PLATELET
Abs Immature Granulocytes: 0.09 10*3/uL — ABNORMAL HIGH (ref 0.00–0.07)
Basophils Absolute: 0 10*3/uL (ref 0.0–0.1)
Basophils Relative: 0 %
Eosinophils Absolute: 0 10*3/uL (ref 0.0–0.5)
Eosinophils Relative: 0 %
HCT: 32.3 % — ABNORMAL LOW (ref 36.0–46.0)
Hemoglobin: 11 g/dL — ABNORMAL LOW (ref 12.0–15.0)
Immature Granulocytes: 1 %
Lymphocytes Relative: 11 %
Lymphs Abs: 1.1 10*3/uL (ref 0.7–4.0)
MCH: 31.2 pg (ref 26.0–34.0)
MCHC: 34.1 g/dL (ref 30.0–36.0)
MCV: 91.5 fL (ref 80.0–100.0)
Monocytes Absolute: 0.8 10*3/uL (ref 0.1–1.0)
Monocytes Relative: 8 %
Neutro Abs: 8.3 10*3/uL — ABNORMAL HIGH (ref 1.7–7.7)
Neutrophils Relative %: 80 %
Platelets: 154 10*3/uL (ref 150–400)
RBC: 3.53 MIL/uL — ABNORMAL LOW (ref 3.87–5.11)
RDW: 13.5 % (ref 11.5–15.5)
WBC: 10.3 10*3/uL (ref 4.0–10.5)
nRBC: 0 % (ref 0.0–0.2)

## 2022-07-23 LAB — BASIC METABOLIC PANEL
Anion gap: 7 (ref 5–15)
BUN: 46 mg/dL — ABNORMAL HIGH (ref 8–23)
CO2: 24 mmol/L (ref 22–32)
Calcium: 9.6 mg/dL (ref 8.9–10.3)
Chloride: 111 mmol/L (ref 98–111)
Creatinine, Ser: 1.17 mg/dL — ABNORMAL HIGH (ref 0.44–1.00)
GFR, Estimated: 47 mL/min — ABNORMAL LOW (ref >60)
Glucose, Bld: 107 mg/dL — ABNORMAL HIGH (ref 70–99)
Potassium: 4 mmol/L (ref 3.5–5.1)
Sodium: 142 mmol/L (ref 135–145)

## 2022-07-23 MED ORDER — OXYCODONE HCL 5 MG PO TABS: 5.0000 mg | ORAL_TABLET | ORAL | 0 refills | Status: DC | PRN

## 2022-07-23 MED ORDER — KETOROLAC TROMETHAMINE 30 MG/ML IJ SOLN
30.0000 mg | Freq: Once | INTRAMUSCULAR | Status: AC
  Administered 2022-07-23: 30 mg via INTRAVENOUS
  Filled 2022-07-23: qty 1

## 2022-07-23 MED ORDER — MORPHINE SULFATE (PF) 4 MG/ML IV SOLN
4.0000 mg | Freq: Once | INTRAVENOUS | Status: AC
  Administered 2022-07-23: 4 mg via INTRAVENOUS
  Filled 2022-07-23: qty 1

## 2022-07-23 NOTE — ED Provider Notes (Signed)
[facility] EMERGENCY DEPARTMENT Provider Note   CSN: [id number] Arrival date & time: 07/23/22  0104     History  Chief Complaint  Patient presents with   Back Pain    Tasha Roberts is a 81 y.o. female.  Patient is a 81 year old female with past medical history of coronary artery disease, paroxysmal atrial fibrillation on Eliquis, type 2 diabetes, Graves' disease, GERD.  Patient presenting today for evaluation of low back pain.  She has been experiencing this for the past 2 months, however has become worse over the past week.  She was seen here several days ago and given Flexeril and Lidoderm, however this does not seem to be helping.  Patient describes severe pain to the low back with no radiation into the legs.  She denies any bowel or bladder complaints.  Pain is exacerbated with any movement and requires significant assistance to sit up and get to the bathroom.  She denies history of prior back surgery.  The history is provided by the patient.       Home Medications Prior to Admission medications   Medication Sig Start Date End Date Taking? Authorizing Provider  apixaban (ELIQUIS) 5 MG TABS tablet TAKE (1) TABLET BY MOUTH TWICE DAILY. Patient taking differently: Take 5 mg by mouth 2 (two) times daily. 09/19/21   Arnoldo Lenis, MD  atorvastatin (LIPITOR) 20 MG tablet TAKE (1) TABLET BY MOUTH ONCE DAILY. Patient taking differently: Take 20 mg by mouth daily. 03/05/22   Lindell Spar, MD  Cholecalciferol 50 MCG (2000 UT) CAPS Take 1 capsule (2,000 Units total) by mouth daily. 03/30/22   Antonieta Pert, MD  clobetasol ointment (TEMOVATE) 0.05 % Apply topically at bedtime. 01/08/22   [provider]  cyclobenzaprine (FLEXERIL) 10 MG tablet Take 1 tablet (10 mg total) by mouth 2 (two) times daily as needed for muscle spasms. 07/12/22   Myna Bright M, PA-C  diltiazem (CARDIZEM CD) 120 MG 24 hr capsule TAKE (1) CAPSULE BY MOUTH ONCE DAILY. 05/16/22   Arnoldo Lenis, MD   diltiazem (TIAZAC) 120 MG 24 hr capsule Take 120 mg by mouth daily.    [provider]  famotidine (PEPCID) 20 MG tablet Take 1 tablet (20 mg total) by mouth 2 (two) times daily as needed for heartburn or indigestion. 12/05/21   Lindell Spar, MD  hyoscyamine (LEVSIN) 0.125 MG tablet Take 1 tablet (0.125 mg total) by mouth every 6 (six) hours as needed for cramping. 04/07/22   Lindell Spar, MD  levothyroxine (SYNTHROID) 100 MCG tablet Take 1 tablet (100 mcg total) by mouth daily before breakfast. 06/09/22   Lindell Spar, MD  lidocaine (LIDODERM) 5 % Place 1 patch onto the skin daily. Remove & Discard patch within 12 hours or as directed by MD 07/12/22   Hendricks Limes, PA-C  lisinopril-hydrochlorothiazide (ZESTORETIC) 20-25 MG tablet TAKE (1) TABLET BY MOUTH EACH MORNING. 06/09/22   Lindell Spar, MD  mirabegron ER (MYRBETRIQ) 25 MG TB24 tablet Take 1 tablet (25 mg total) by mouth daily. 04/16/22   McKenzie, Candee Furbish, MD  mirtazapine (REMERON) 7.5 MG tablet Take 1 tablet (7.5 mg total) by mouth at bedtime. 12/05/21   Lindell Spar, MD  nitroGLYCERIN (NITROSTAT) 0.4 MG SL tablet Place 1 tablet (0.4 mg total) under the tongue every 5 (five) minutes as needed for chest pain. 07/31/21   Lindell Spar, MD  solifenacin (VESICARE) 5 MG tablet Take 1 tablet (5 mg total) by  mouth daily. 05/16/22   McKenzie, Candee Furbish, MD  sulfamethoxazole-trimethoprim (BACTRIM DS) 800-160 MG tablet Take 1 tablet by mouth 2 (two) times daily. 06/19/22   Summerlin, Berneice Heinrich, PA-C  tolterodine (DETROL LA) 4 MG 24 hr capsule Take 1 capsule (4 mg total) by mouth daily. 06/16/22   McKenzie, Candee Furbish, MD  ursodiol (ACTIGALL) 250 MG tablet Take 250 mg by mouth 2 (two) times daily.    [provider]  vitamin B-12 (CYANOCOBALAMIN) 500 MCG tablet Take 500 mcg by mouth daily.    [provider]      Allergies    Dicyclomine, Glucophage [metformin], and Losartan    Review of Systems   Review  of Systems  All other systems reviewed and are negative.   Physical Exam Updated Vital Signs BP 136/86 (BP Location: Right Arm)   Pulse (!) 101   Temp (!) 97.5 F (36.4 C) (Oral)   Resp 17   Ht 5' 3"  (1.6 m)   Wt 59.4 kg   SpO2 98%   BMI 23.20 kg/m  Physical Exam Vitals and nursing note reviewed.  Constitutional:      General: She is not in acute distress.    Appearance: She is well-developed. She is not diaphoretic.  HENT:     Head: Normocephalic and atraumatic.  Cardiovascular:     Rate and Rhythm: Normal rate and regular rhythm.     Heart sounds: No murmur heard.    No friction rub. No gallop.  Pulmonary:     Effort: Pulmonary effort is normal. No respiratory distress.     Breath sounds: Normal breath sounds. No wheezing.  Abdominal:     General: Bowel sounds are normal. There is no distension.     Palpations: Abdomen is soft.     Tenderness: There is no abdominal tenderness.  Musculoskeletal:        General: Normal range of motion.     Cervical back: Normal range of motion and neck supple.     Comments: There is tenderness to palpation in the lower lumbar region.  There is no bony tenderness or step-off.  Skin:    General: Skin is warm and dry.  Neurological:     General: No focal deficit present.     Mental Status: She is alert and oriented to person, place, and time.     Comments: Strength is 5 out of 5 in both lower extremities.  Sensation is intact throughout.     ED Results / Procedures / Treatments   Labs (all labs ordered are listed, but only abnormal results are displayed) Labs Reviewed - No data to display  EKG None  Radiology No results found.  Procedures Procedures    Medications Ordered in ED Medications  morphine (PF) 4 MG/ML injection 4 mg (has no administration in time range)  ketorolac (TORADOL) 30 MG/ML injection 30 mg (has no administration in time range)    ED Course/ Medical Decision Making/ A&P  Patient is a 81 year old  female presenting with complaints of severe low back pain, worsening over the past several days.  She was given Lidoderm and Flexeril in the ER several days ago, however this has not helped.  Her pain is worsening.  There are no red flags on exam that would suggest an emergent situation.  She is having no weakness or numbness and no bowel or bladder complaints.  Work-up shows an L2 compression fracture with 30% height loss and 4 mm of retropulsion.  Patient  given medication for pain and seems to be feeling somewhat better.  She was able to ambulate in the emergency department, however slowly and with antalgic gait.  At this point, disposition was discussed with patient and daughter at bedside.  Both are comfortable with her going home.  I will prescribe oxycodone which she can take for pain.  I will also notify Dr. Arnoldo Morale from neurosurgery of the patient's ER visit and diagnosis as they have an appointment to see him in the upcoming weeks.  Final Clinical Impression(s) / ED Diagnoses Final diagnoses:  None    Rx / DC Orders ED Discharge Orders     None         Veryl Speak, MD 07/23/22 [phone]

## 2022-07-23 NOTE — Discharge Instructions (Signed)
Begin taking oxycodone as prescribed.  Follow-up with Dr. Arnoldo Morale in the next few days.  His contact information has been provided in this discharge summary for you to call and make these arrangements.

## 2022-07-23 NOTE — ED Triage Notes (Signed)
Pt states the back pain is not getting better and pain is not under control.

## 2022-07-24 ENCOUNTER — Inpatient Hospital Stay (HOSPITAL_COMMUNITY)
Admission: EM | Admit: 2022-07-24 | Discharge: 2022-07-28 | DRG: 689 | Disposition: A | Discharge status: Skilled Nursing Facility | Payer: Medicare Other | Attending: Internal Medicine | Admitting: Internal Medicine

## 2022-07-24 ENCOUNTER — Telehealth: Payer: Self-pay | Admitting: Internal Medicine

## 2022-07-24 ENCOUNTER — Encounter (HOSPITAL_COMMUNITY): Payer: Self-pay

## 2022-07-24 ENCOUNTER — Other Ambulatory Visit: Payer: Self-pay

## 2022-07-24 DIAGNOSIS — I2510 Atherosclerotic heart disease of native coronary artery without angina pectoris: Secondary | ICD-10-CM | POA: Diagnosis present

## 2022-07-24 DIAGNOSIS — Z853 Personal history of malignant neoplasm of breast: Secondary | ICD-10-CM

## 2022-07-24 DIAGNOSIS — I4891 Unspecified atrial fibrillation: Secondary | ICD-10-CM

## 2022-07-24 DIAGNOSIS — K219 Gastro-esophageal reflux disease without esophagitis: Secondary | ICD-10-CM | POA: Diagnosis present

## 2022-07-24 DIAGNOSIS — Z1611 Resistance to penicillins: Secondary | ICD-10-CM | POA: Diagnosis present

## 2022-07-24 DIAGNOSIS — I480 Paroxysmal atrial fibrillation: Secondary | ICD-10-CM | POA: Diagnosis present

## 2022-07-24 DIAGNOSIS — E119 Type 2 diabetes mellitus without complications: Secondary | ICD-10-CM | POA: Diagnosis present

## 2022-07-24 DIAGNOSIS — Z79890 Hormone replacement therapy (postmenopausal): Secondary | ICD-10-CM

## 2022-07-24 DIAGNOSIS — K7581 Nonalcoholic steatohepatitis (NASH): Secondary | ICD-10-CM | POA: Diagnosis present

## 2022-07-24 DIAGNOSIS — E890 Postprocedural hypothyroidism: Secondary | ICD-10-CM | POA: Diagnosis present

## 2022-07-24 DIAGNOSIS — I959 Hypotension, unspecified: Secondary | ICD-10-CM | POA: Diagnosis present

## 2022-07-24 DIAGNOSIS — M4856XA Collapsed vertebra, not elsewhere classified, lumbar region, initial encounter for fracture: Secondary | ICD-10-CM | POA: Diagnosis present

## 2022-07-24 DIAGNOSIS — M19041 Primary osteoarthritis, right hand: Secondary | ICD-10-CM | POA: Diagnosis present

## 2022-07-24 DIAGNOSIS — D649 Anemia, unspecified: Secondary | ICD-10-CM

## 2022-07-24 DIAGNOSIS — B961 Klebsiella pneumoniae [K. pneumoniae] as the cause of diseases classified elsewhere: Secondary | ICD-10-CM | POA: Diagnosis present

## 2022-07-24 DIAGNOSIS — I10 Essential (primary) hypertension: Secondary | ICD-10-CM | POA: Diagnosis present

## 2022-07-24 DIAGNOSIS — R6510 Systemic inflammatory response syndrome (SIRS) of non-infectious origin without acute organ dysfunction: Secondary | ICD-10-CM

## 2022-07-24 DIAGNOSIS — Z9851 Tubal ligation status: Secondary | ICD-10-CM

## 2022-07-24 DIAGNOSIS — R4182 Altered mental status, unspecified: Principal | ICD-10-CM

## 2022-07-24 DIAGNOSIS — Z79899 Other long term (current) drug therapy: Secondary | ICD-10-CM

## 2022-07-24 DIAGNOSIS — Y842 Radiological procedure and radiotherapy as the cause of abnormal reaction of the patient, or of later complication, without mention of misadventure at the time of the procedure: Secondary | ICD-10-CM | POA: Diagnosis present

## 2022-07-24 DIAGNOSIS — E039 Hypothyroidism, unspecified: Secondary | ICD-10-CM | POA: Diagnosis present

## 2022-07-24 DIAGNOSIS — Z8249 Family history of ischemic heart disease and other diseases of the circulatory system: Secondary | ICD-10-CM

## 2022-07-24 DIAGNOSIS — E782 Mixed hyperlipidemia: Secondary | ICD-10-CM | POA: Diagnosis present

## 2022-07-24 DIAGNOSIS — Z7901 Long term (current) use of anticoagulants: Secondary | ICD-10-CM

## 2022-07-24 DIAGNOSIS — G9341 Metabolic encephalopathy: Secondary | ICD-10-CM | POA: Diagnosis present

## 2022-07-24 DIAGNOSIS — D689 Coagulation defect, unspecified: Secondary | ICD-10-CM | POA: Diagnosis present

## 2022-07-24 DIAGNOSIS — Z87412 Personal history of vulvar dysplasia: Secondary | ICD-10-CM

## 2022-07-24 DIAGNOSIS — Z90710 Acquired absence of both cervix and uterus: Secondary | ICD-10-CM

## 2022-07-24 DIAGNOSIS — M19042 Primary osteoarthritis, left hand: Secondary | ICD-10-CM | POA: Diagnosis present

## 2022-07-24 DIAGNOSIS — K7460 Unspecified cirrhosis of liver: Secondary | ICD-10-CM | POA: Diagnosis present

## 2022-07-24 DIAGNOSIS — Z9012 Acquired absence of left breast and nipple: Secondary | ICD-10-CM

## 2022-07-24 DIAGNOSIS — D638 Anemia in other chronic diseases classified elsewhere: Secondary | ICD-10-CM | POA: Diagnosis present

## 2022-07-24 DIAGNOSIS — S32020A Wedge compression fracture of second lumbar vertebra, initial encounter for closed fracture: Secondary | ICD-10-CM

## 2022-07-24 DIAGNOSIS — Z7984 Long term (current) use of oral hypoglycemic drugs: Secondary | ICD-10-CM

## 2022-07-24 DIAGNOSIS — N390 Urinary tract infection, site not specified: Principal | ICD-10-CM | POA: Diagnosis present

## 2022-07-24 DIAGNOSIS — Z888 Allergy status to other drugs, medicaments and biological substances status: Secondary | ICD-10-CM

## 2022-07-24 DIAGNOSIS — K5909 Other constipation: Secondary | ICD-10-CM | POA: Diagnosis present

## 2022-07-24 DIAGNOSIS — N3000 Acute cystitis without hematuria: Secondary | ICD-10-CM

## 2022-07-24 LAB — BASIC METABOLIC PANEL
Anion gap: 11 (ref 5–15)
BUN: 46 mg/dL — ABNORMAL HIGH (ref 8–23)
CO2: 23 mmol/L (ref 22–32)
Calcium: 9.5 mg/dL (ref 8.9–10.3)
Chloride: 109 mmol/L (ref 98–111)
Creatinine, Ser: 1.12 mg/dL — ABNORMAL HIGH (ref 0.44–1.00)
GFR, Estimated: 50 mL/min — ABNORMAL LOW (ref >60)
Glucose, Bld: 88 mg/dL (ref 70–99)
Potassium: 3.9 mmol/L (ref 3.5–5.1)
Sodium: 143 mmol/L (ref 135–145)

## 2022-07-24 LAB — CBC WITH DIFFERENTIAL/PLATELET
Abs Immature Granulocytes: 0.05 10*3/uL (ref 0.00–0.07)
Basophils Absolute: 0 10*3/uL (ref 0.0–0.1)
Basophils Relative: 0 %
Eosinophils Absolute: 0.1 10*3/uL (ref 0.0–0.5)
Eosinophils Relative: 1 %
HCT: 31.3 % — ABNORMAL LOW (ref 36.0–46.0)
Hemoglobin: 10.4 g/dL — ABNORMAL LOW (ref 12.0–15.0)
Immature Granulocytes: 1 %
Lymphocytes Relative: 15 %
Lymphs Abs: 1.3 10*3/uL (ref 0.7–4.0)
MCH: 30.7 pg (ref 26.0–34.0)
MCHC: 33.2 g/dL (ref 30.0–36.0)
MCV: 92.3 fL (ref 80.0–100.0)
Monocytes Absolute: 0.5 10*3/uL (ref 0.1–1.0)
Monocytes Relative: 7 %
Neutro Abs: 6.3 10*3/uL (ref 1.7–7.7)
Neutrophils Relative %: 76 %
Platelets: 127 10*3/uL — ABNORMAL LOW (ref 150–400)
RBC: 3.39 MIL/uL — ABNORMAL LOW (ref 3.87–5.11)
RDW: 13.5 % (ref 11.5–15.5)
WBC: 8.2 10*3/uL (ref 4.0–10.5)
nRBC: 0 % (ref 0.0–0.2)

## 2022-07-24 MED ORDER — SODIUM CHLORIDE 0.9 % IV BOLUS
500.0000 mL | Freq: Once | INTRAVENOUS | Status: AC
  Administered 2022-07-24: 500 mL via INTRAVENOUS

## 2022-07-24 MED ORDER — OXYCODONE-ACETAMINOPHEN 5-325 MG PO TABS
2.0000 | ORAL_TABLET | Freq: Once | ORAL | Status: AC
  Administered 2022-07-24: 2 via ORAL
  Filled 2022-07-24: qty 2

## 2022-07-24 MED ORDER — DILTIAZEM HCL 25 MG/5ML IV SOLN
10.0000 mg | Freq: Once | INTRAVENOUS | Status: AC
  Administered 2022-07-24: 10 mg via INTRAVENOUS
  Filled 2022-07-24: qty 5

## 2022-07-24 NOTE — ED Provider Notes (Signed)
[facility] EMERGENCY DEPARTMENT Provider Note   CSN: [id number] Arrival date & time: 07/24/22  2040     History Chief Complaint  Patient presents with   Back Pain    Tasha Roberts is a 81 y.o. female patient who presents to the emergency department today for further evaluation of back pain and altered mental status.  Patient was seen here yesterday and had a CT lumbar which showed compression fracture of L2.  She was prescribed oxycodone.  She is been taking this.  Today, the daughter noticed that she has been increasingly altered.  She states 1 particular instance the patient was trying to use the bathroom but went to the kitchen and tried to use the bathroom at the kitchen table.  She denies any urinary symptoms, focal weakness or numbness to the lower extremities, any new injury, fever, chills, chest pain or shortness of breath.   Back Pain      Home Medications Prior to Admission medications   Medication Sig Start Date End Date Taking? Authorizing Provider  apixaban (ELIQUIS) 5 MG TABS tablet TAKE (1) TABLET BY MOUTH TWICE DAILY. Patient taking differently: Take 5 mg by mouth 2 (two) times daily. 09/19/21   Arnoldo Lenis, MD  atorvastatin (LIPITOR) 20 MG tablet TAKE (1) TABLET BY MOUTH ONCE DAILY. Patient taking differently: Take 20 mg by mouth daily. 03/05/22   Lindell Spar, MD  Cholecalciferol 50 MCG (2000 UT) CAPS Take 1 capsule (2,000 Units total) by mouth daily. 03/30/22   Antonieta Pert, MD  clobetasol ointment (TEMOVATE) 0.05 % Apply topically at bedtime. 01/08/22   [provider]  cyclobenzaprine (FLEXERIL) 10 MG tablet Take 1 tablet (10 mg total) by mouth 2 (two) times daily as needed for muscle spasms. 07/12/22   Myna Bright M, PA-C  diltiazem (CARDIZEM CD) 120 MG 24 hr capsule TAKE (1) CAPSULE BY MOUTH ONCE DAILY. 05/16/22   Arnoldo Lenis, MD  diltiazem (TIAZAC) 120 MG 24 hr capsule Take 120 mg by mouth daily.    [provider]  famotidine  (PEPCID) 20 MG tablet Take 1 tablet (20 mg total) by mouth 2 (two) times daily as needed for heartburn or indigestion. 12/05/21   Lindell Spar, MD  hyoscyamine (LEVSIN) 0.125 MG tablet Take 1 tablet (0.125 mg total) by mouth every 6 (six) hours as needed for cramping. 04/07/22   Lindell Spar, MD  levothyroxine (SYNTHROID) 100 MCG tablet Take 1 tablet (100 mcg total) by mouth daily before breakfast. 06/09/22   Lindell Spar, MD  lidocaine (LIDODERM) 5 % Place 1 patch onto the skin daily. Remove & Discard patch within 12 hours or as directed by MD 07/12/22   Hendricks Limes, PA-C  lisinopril-hydrochlorothiazide (ZESTORETIC) 20-25 MG tablet TAKE (1) TABLET BY MOUTH EACH MORNING. 06/09/22   Lindell Spar, MD  mirabegron ER (MYRBETRIQ) 25 MG TB24 tablet Take 1 tablet (25 mg total) by mouth daily. 04/16/22   McKenzie, Candee Furbish, MD  mirtazapine (REMERON) 7.5 MG tablet Take 1 tablet (7.5 mg total) by mouth at bedtime. 12/05/21   Lindell Spar, MD  nitroGLYCERIN (NITROSTAT) 0.4 MG SL tablet Place 1 tablet (0.4 mg total) under the tongue every 5 (five) minutes as needed for chest pain. 07/31/21   Lindell Spar, MD  oxyCODONE (OXY IR/ROXICODONE) 5 MG immediate release tablet Take 1 tablet (5 mg total) by mouth every 4 (four) hours as needed for severe pain. 07/23/22   Veryl Speak, MD  solifenacin (VESICARE) 5 MG tablet Take 1 tablet (5 mg total) by mouth daily. 05/16/22   McKenzie, Candee Furbish, MD  sulfamethoxazole-trimethoprim (BACTRIM DS) 800-160 MG tablet Take 1 tablet by mouth 2 (two) times daily. 06/19/22   Summerlin, Berneice Heinrich, PA-C  tolterodine (DETROL LA) 4 MG 24 hr capsule Take 1 capsule (4 mg total) by mouth daily. 06/16/22   McKenzie, Candee Furbish, MD  ursodiol (ACTIGALL) 250 MG tablet Take 250 mg by mouth 2 (two) times daily.    [provider]  vitamin B-12 (CYANOCOBALAMIN) 500 MCG tablet Take 500 mcg by mouth daily.    [provider]      Allergies    Dicyclomine,  Glucophage [metformin], and Losartan    Review of Systems   Review of Systems  Musculoskeletal:  Positive for back pain.  All other systems reviewed and are negative.   Physical Exam Updated Vital Signs BP 139/76   Pulse 64   Temp 98.3 F (36.8 C)   Resp 11   Ht 5' 3"  (1.6 m)   Wt 59.4 kg   SpO2 99%   BMI 23.20 kg/m  Physical Exam Vitals and nursing note reviewed.  Constitutional:      Appearance: Normal appearance.  HENT:     Head: Normocephalic and atraumatic.  Eyes:     General:        Right eye: No discharge.        Left eye: No discharge.     Conjunctiva/sclera: Conjunctivae normal.  Pulmonary:     Effort: Pulmonary effort is normal.  Musculoskeletal:     Comments: There is midline lumbar tenderness.  Skin:    General: Skin is warm and dry.     Findings: No rash.  Neurological:     General: No focal deficit present.     Mental Status: She is alert.  Psychiatric:        Mood and Affect: Mood normal.        Behavior: Behavior normal.     ED Results / Procedures / Treatments   Labs (all labs ordered are listed, but only abnormal results are displayed) Labs Reviewed  CBC WITH DIFFERENTIAL/PLATELET  BASIC METABOLIC PANEL  URINALYSIS, ROUTINE W REFLEX MICROSCOPIC    EKG None  Radiology CT Lumbar Spine Wo Contrast  Result Date: 07/23/2022 CLINICAL DATA:  Low back pain since 8/10 EXAM: CT LUMBAR SPINE WITHOUT CONTRAST TECHNIQUE: Multidetector CT imaging of the lumbar spine was performed without intravenous contrast administration. Multiplanar CT image reconstructions were also generated. RADIATION DOSE REDUCTION: This exam was performed according to the departmental dose-optimization program which includes automated exposure control, adjustment of the mA and/or kV according to patient size and/or use of iterative reconstruction technique. COMPARISON:  No prior CT of the lumbar spine. Correlation is made with 03/27/2022 CT abdomen pelvis FINDINGS:  Segmentation: 5 lumbar type vertebrae. Alignment: Trace retrolisthesis of L1 on L2 and L2 on L3. Straightening of the normal lumbar lordosis. Mild levocurvature. Vertebrae: Compression fracture of L2, with approximately 30% vertebral body height loss likely acute. 4 mm retropulsion of the posteroinferior cortex vertebral body heights are otherwise unchanged compared to April 2023. No suspicious osseous lesion. Paraspinal and other soft tissues: Aortic atherosclerosis. Disc levels: T12-L1: Seen only on the sagittal images. No significant disc bulge, spinal canal stenosis, or neural foraminal narrowing. L1-L2: Mild calcified disc bulge. Mild facet arthropathy. No spinal canal stenosis or neural foraminal narrowing. L2-L3: Moderate disc bulge. Mild facet arthropathy. Ligamentum flavum hypertrophy.  Moderate spinal canal stenosis. No neural foraminal narrowing. L3-L4: Moderate disc bulge. Moderate facet arthropathy. Ligamentum flavum hypertrophy. Moderate spinal canal stenosis. No neural foraminal narrowing. L4-L5: Disc height loss with mild disc osteophyte complex. Moderate facet arthropathy. No spinal canal stenosis. Mild bilateral neural foraminal narrowing. L5-S1: No significant disc bulge. Moderate facet arthropathy. No spinal canal stenosis or neural foraminal narrowing. IMPRESSION: 1. Acute appearing compression fracture of L2 with approximately 30% vertebral body height loss and 4 mm retropulsion of the posteroinferior cortex. 2. 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 3. L4-L5 mild bilateral foraminal narrowing. These results were called by telephone at the time of interpretation on 07/23/2022 at 4:01 am to provider Veryl Speak , who verbally acknowledged these results. Electronically Signed   By: Merilyn Baba M.D.   On: 07/23/2022 04:03    Procedures Procedures    Medications Ordered in ED Medications  oxyCODONE-acetaminophen (PERCOCET/ROXICET) 5-325 MG per tablet 2 tablet (2 tablets Oral Given  07/24/22 2227)    ED Course/ Medical Decision Making/ A&P Clinical Course as of 07/24/22 2319  Thu Jul 24, 2022  2317 CBC with Differential(!) No evidence of leukocytosis.  There is mild anemia which seems to be at the patient's baseline. [CF]  4128 Basic metabolic panel(!) Electrolytes are normal.  Creatinine at baseline. [CF]    Clinical Course User Index [CF] Hendricks Limes, PA-C                           Medical Decision Making Teagan Ozawa is a 81 y.o. female patient who presents to the emergency department for further evaluation of back pain and altered mental status.  Her back pain is likely secondary to the compression fracture she was diagnosed with yesterday.  With regards to the altered mental status, this could be medication related from the narcotics that she was prescribed yesterday.  We will get basic labs and urinalysis to evaluate for any other etiologies.  Patient answers all my questions appropriately.  She does appear in pain but is in no acute distress.  I will plan to reassess once labs and urine results.  CBC and BMP are normal.  Urine studies to be collected.  If urine is positive, she should likely be treated for urinary tract infection.  If negative this is likely secondary to medication and new narcotics.  I do believe the patient can likely be discharged. Due to shift change, her care will be transferred to Dr. Sedonia Small where ultimate disposition will be made.   Amount and/or Complexity of Data Reviewed Labs: ordered. Decision-making details documented in ED Course.  Risk Prescription drug management.   Final Clinical Impression(s) / ED Diagnoses Final diagnoses:  None    Rx / DC Orders ED Discharge Orders     None         Cherrie Gauze 07/24/22 2323    Teressa Lower, MD 07/25/22 [phone]

## 2022-07-24 NOTE — Telephone Encounter (Signed)
Patient Video visit scheduled , patient / daughter aware.  Patient unable to come in for office visit due to minimal movement and pain   Fracture in lower back

## 2022-07-24 NOTE — ED Triage Notes (Addendum)
Pt presents with chronic back pain due to compression fracture. Pt taking percocet at home and states she does not like it because it makes her "loopy". Was seen here last night for same. Pt has not had pain meds since this morning at 10am due to daughter not wanting to give her that percocet since it made her confused. Pt now in pain 10/10.

## 2022-07-24 NOTE — Telephone Encounter (Signed)
noted 

## 2022-07-24 NOTE — Telephone Encounter (Signed)
Called in on patient behalf in regard to ER visit on Tuesday  .  Patient received shot of morphine and has been confused.  Daughter wanted a call back , could it be the morphine wearing off or  something else.  Also patient has not been eating since Tuesday.

## 2022-07-24 NOTE — ED Provider Notes (Signed)
  Provider Note MRN:  [id number]  Arrival date & time: 07/25/22    ED Course and Medical Decision Making  Assumed care from Dr. Matilde Sprang at shift change.  Back pain, known compression fracture, no neurological concerns, some mild confusion at home, favoring due to recent prescription of narcotics.  Awaiting urinalysis.  Anticipating discharge.  11:45 PM update: On my assessment patient is with heart rate in the 140s, A-fib with RVR on the monitor.  Per chart review she has a long history of A-fib, takes Eliquis, takes diltiazem.  Patient is confused and generally cannot answer simple questions.  Does not know if she took her medicines today.  I called her daughter, who is with her all day, she gave her all of her medications today.  Daughter is fairly concerned about this acute change to her mental status, pharmacologic causes considered but will broaden differential diagnosis given this A-fib with RVR, considering UTI, sepsis, electrolyte disturbance, intracranial process.  Given her anticoagulated status and daughter's concern that she is too altered to be at home, concerned that she will fall, will consider admission.  At this time will attempt rate control.  2 AM update: After IV diltiazem patient seems to be converting back to sinus rhythm with rates in the 70s.  Urinalysis is showing evidence of infection.  She remains mildly altered.  Will admit to medicine.  Starting ceftriaxone.  .Critical Care  Performed by: Maudie Flakes, MD Authorized by: Maudie Flakes, MD   Critical care provider statement:    Critical care time (minutes):  35   Critical care was necessary to treat or prevent imminent or life-threatening deterioration of the following conditions: A-fib with RVR.   Critical care was time spent personally by me on the following activities:  Development of treatment plan with patient or surrogate, discussions with consultants, evaluation of patient's response to treatment, examination  of patient, ordering and review of laboratory studies, ordering and review of radiographic studies, ordering and performing treatments and interventions, pulse oximetry, re-evaluation of patient's condition and review of old charts   Final Clinical Impressions(s) / ED Diagnoses     ICD-10-CM   1. Altered mental status, unspecified altered mental status type  R41.82     2. Atrial fibrillation with rapid ventricular response (HCC)  I48.91     3. Acute cystitis without hematuria  N30.00       ED Discharge Orders     None       Discharge Instructions   None     Barth Kirks. [place]onia Small, [place] [email or web address]    Maudie Flakes, MD 07/25/22 [phone]

## 2022-07-24 NOTE — Telephone Encounter (Signed)
Patient needs an ER follow up with any provider please schedule

## 2022-07-25 ENCOUNTER — Inpatient Hospital Stay: Payer: Medicare Other

## 2022-07-25 ENCOUNTER — Telehealth: Payer: Medicare Other | Admitting: Family Medicine

## 2022-07-25 ENCOUNTER — Emergency Department (HOSPITAL_COMMUNITY): Payer: Medicare Other

## 2022-07-25 DIAGNOSIS — S32020A Wedge compression fracture of second lumbar vertebra, initial encounter for closed fracture: Secondary | ICD-10-CM

## 2022-07-25 DIAGNOSIS — N390 Urinary tract infection, site not specified: Secondary | ICD-10-CM | POA: Diagnosis present

## 2022-07-25 DIAGNOSIS — E119 Type 2 diabetes mellitus without complications: Secondary | ICD-10-CM | POA: Diagnosis present

## 2022-07-25 DIAGNOSIS — Z1611 Resistance to penicillins: Secondary | ICD-10-CM | POA: Diagnosis present

## 2022-07-25 DIAGNOSIS — Y842 Radiological procedure and radiotherapy as the cause of abnormal reaction of the patient, or of later complication, without mention of misadventure at the time of the procedure: Secondary | ICD-10-CM | POA: Diagnosis present

## 2022-07-25 DIAGNOSIS — M19041 Primary osteoarthritis, right hand: Secondary | ICD-10-CM | POA: Diagnosis present

## 2022-07-25 DIAGNOSIS — I480 Paroxysmal atrial fibrillation: Secondary | ICD-10-CM | POA: Diagnosis present

## 2022-07-25 DIAGNOSIS — I2510 Atherosclerotic heart disease of native coronary artery without angina pectoris: Secondary | ICD-10-CM | POA: Diagnosis present

## 2022-07-25 DIAGNOSIS — M19042 Primary osteoarthritis, left hand: Secondary | ICD-10-CM | POA: Diagnosis present

## 2022-07-25 DIAGNOSIS — K7581 Nonalcoholic steatohepatitis (NASH): Secondary | ICD-10-CM | POA: Diagnosis present

## 2022-07-25 DIAGNOSIS — G9341 Metabolic encephalopathy: Secondary | ICD-10-CM | POA: Diagnosis present

## 2022-07-25 DIAGNOSIS — K219 Gastro-esophageal reflux disease without esophagitis: Secondary | ICD-10-CM | POA: Diagnosis present

## 2022-07-25 DIAGNOSIS — D689 Coagulation defect, unspecified: Secondary | ICD-10-CM | POA: Diagnosis present

## 2022-07-25 DIAGNOSIS — Z79890 Hormone replacement therapy (postmenopausal): Secondary | ICD-10-CM | POA: Diagnosis not present

## 2022-07-25 DIAGNOSIS — M4856XA Collapsed vertebra, not elsewhere classified, lumbar region, initial encounter for fracture: Secondary | ICD-10-CM | POA: Diagnosis present

## 2022-07-25 DIAGNOSIS — K7460 Unspecified cirrhosis of liver: Secondary | ICD-10-CM | POA: Diagnosis present

## 2022-07-25 DIAGNOSIS — D638 Anemia in other chronic diseases classified elsewhere: Secondary | ICD-10-CM | POA: Diagnosis present

## 2022-07-25 DIAGNOSIS — K5909 Other constipation: Secondary | ICD-10-CM | POA: Diagnosis present

## 2022-07-25 DIAGNOSIS — B961 Klebsiella pneumoniae [K. pneumoniae] as the cause of diseases classified elsewhere: Secondary | ICD-10-CM | POA: Diagnosis present

## 2022-07-25 DIAGNOSIS — Z9851 Tubal ligation status: Secondary | ICD-10-CM | POA: Diagnosis not present

## 2022-07-25 DIAGNOSIS — S32020S Wedge compression fracture of second lumbar vertebra, sequela: Secondary | ICD-10-CM | POA: Diagnosis not present

## 2022-07-25 DIAGNOSIS — E782 Mixed hyperlipidemia: Secondary | ICD-10-CM | POA: Diagnosis present

## 2022-07-25 DIAGNOSIS — E890 Postprocedural hypothyroidism: Secondary | ICD-10-CM | POA: Diagnosis present

## 2022-07-25 DIAGNOSIS — I10 Essential (primary) hypertension: Secondary | ICD-10-CM | POA: Diagnosis present

## 2022-07-25 DIAGNOSIS — I4891 Unspecified atrial fibrillation: Secondary | ICD-10-CM | POA: Diagnosis present

## 2022-07-25 DIAGNOSIS — R6510 Systemic inflammatory response syndrome (SIRS) of non-infectious origin without acute organ dysfunction: Secondary | ICD-10-CM

## 2022-07-25 DIAGNOSIS — Z79899 Other long term (current) drug therapy: Secondary | ICD-10-CM | POA: Diagnosis not present

## 2022-07-25 DIAGNOSIS — Z7901 Long term (current) use of anticoagulants: Secondary | ICD-10-CM | POA: Diagnosis not present

## 2022-07-25 DIAGNOSIS — I959 Hypotension, unspecified: Secondary | ICD-10-CM | POA: Diagnosis present

## 2022-07-25 LAB — URINALYSIS, ROUTINE W REFLEX MICROSCOPIC
Bilirubin Urine: NEGATIVE
Glucose, UA: NEGATIVE mg/dL
Hgb urine dipstick: NEGATIVE
Ketones, ur: NEGATIVE mg/dL
Nitrite: POSITIVE — AB
Protein, ur: NEGATIVE mg/dL
Specific Gravity, Urine: 1.016 (ref 1.005–1.030)
Trans Epithel, UA: <1
WBC, UA: >50 WBC/hpf — ABNORMAL HIGH (ref 0–5)
pH: 5 (ref 5.0–8.0)

## 2022-07-25 LAB — CBC WITH DIFFERENTIAL/PLATELET
Abs Immature Granulocytes: 0.02 10*3/uL (ref 0.00–0.07)
Basophils Absolute: 0 10*3/uL (ref 0.0–0.1)
Basophils Relative: 0 %
Eosinophils Absolute: 0.1 10*3/uL (ref 0.0–0.5)
Eosinophils Relative: 1 %
HCT: 28.8 % — ABNORMAL LOW (ref 36.0–46.0)
Hemoglobin: 9.3 g/dL — ABNORMAL LOW (ref 12.0–15.0)
Immature Granulocytes: 0 %
Lymphocytes Relative: 13 %
Lymphs Abs: 0.8 10*3/uL (ref 0.7–4.0)
MCH: 30.4 pg (ref 26.0–34.0)
MCHC: 32.3 g/dL (ref 30.0–36.0)
MCV: 94.1 fL (ref 80.0–100.0)
Monocytes Absolute: 0.4 10*3/uL (ref 0.1–1.0)
Monocytes Relative: 6 %
Neutro Abs: 4.8 10*3/uL (ref 1.7–7.7)
Neutrophils Relative %: 80 %
Platelets: 100 10*3/uL — ABNORMAL LOW (ref 150–400)
RBC: 3.06 MIL/uL — ABNORMAL LOW (ref 3.87–5.11)
RDW: 13.7 % (ref 11.5–15.5)
WBC: 6.1 10*3/uL (ref 4.0–10.5)
nRBC: 0 % (ref 0.0–0.2)

## 2022-07-25 LAB — PROTIME-INR
INR: 2.1 — ABNORMAL HIGH (ref 0.8–1.2)
Prothrombin Time: 23.3 seconds — ABNORMAL HIGH (ref 11.4–15.2)

## 2022-07-25 LAB — CORTISOL-AM, BLOOD: Cortisol - AM: 17 ug/dL (ref 6.7–22.6)

## 2022-07-25 LAB — COMPREHENSIVE METABOLIC PANEL
ALT: 55 U/L — ABNORMAL HIGH (ref 0–44)
AST: 43 U/L — ABNORMAL HIGH (ref 15–41)
Albumin: 3 g/dL — ABNORMAL LOW (ref 3.5–5.0)
Alkaline Phosphatase: 114 U/L (ref 38–126)
Anion gap: 9 (ref 5–15)
BUN: 39 mg/dL — ABNORMAL HIGH (ref 8–23)
CO2: 23 mmol/L (ref 22–32)
Calcium: 8.5 mg/dL — ABNORMAL LOW (ref 8.9–10.3)
Chloride: 110 mmol/L (ref 98–111)
Creatinine, Ser: 0.97 mg/dL (ref 0.44–1.00)
GFR, Estimated: 59 mL/min — ABNORMAL LOW (ref >60)
Glucose, Bld: 82 mg/dL (ref 70–99)
Potassium: 3.8 mmol/L (ref 3.5–5.1)
Sodium: 142 mmol/L (ref 135–145)
Total Bilirubin: 0.8 mg/dL (ref 0.3–1.2)
Total Protein: 6 g/dL — ABNORMAL LOW (ref 6.5–8.1)

## 2022-07-25 LAB — LACTIC ACID, PLASMA
Lactic Acid, Venous: 1.6 mmol/L (ref 0.5–1.9)
Lactic Acid, Venous: 1.4 mmol/L (ref 0.5–1.9)

## 2022-07-25 LAB — TROPONIN I (HIGH SENSITIVITY): Troponin I (High Sensitivity): 17 ng/L (ref <18)

## 2022-07-25 LAB — TSH: TSH: 4.145 u[IU]/mL (ref 0.350–4.500)

## 2022-07-25 LAB — PROCALCITONIN: Procalcitonin: 0.11 ng/mL

## 2022-07-25 LAB — MAGNESIUM: Magnesium: 2.2 mg/dL (ref 1.7–2.4)

## 2022-07-25 MED ORDER — ACETAMINOPHEN 650 MG RE SUPP: 650.0000 mg | Freq: Four times a day (QID) | RECTAL | Status: DC | PRN

## 2022-07-25 MED ORDER — APIXABAN 5 MG PO TABS
5.0000 mg | ORAL_TABLET | Freq: Two times a day (BID) | ORAL | Status: DC
  Administered 2022-07-25 – 2022-07-28 (×7): 5 mg via ORAL
  Filled 2022-07-25 (×7): qty 1

## 2022-07-25 MED ORDER — ACETAMINOPHEN 325 MG PO TABS: 650.0000 mg | ORAL_TABLET | Freq: Four times a day (QID) | ORAL | Status: DC | PRN

## 2022-07-25 MED ORDER — MIRABEGRON ER 25 MG PO TB24: 25.0000 mg | ORAL_TABLET | Freq: Every day | ORAL | Status: DC

## 2022-07-25 MED ORDER — ATORVASTATIN CALCIUM 20 MG PO TABS
20.0000 mg | ORAL_TABLET | Freq: Every day | ORAL | Status: DC
  Administered 2022-07-25 – 2022-07-28 (×4): 20 mg via ORAL
  Filled 2022-07-25 (×4): qty 1

## 2022-07-25 MED ORDER — FAMOTIDINE 20 MG PO TABS: 20.0000 mg | ORAL_TABLET | Freq: Two times a day (BID) | ORAL | Status: DC | PRN

## 2022-07-25 MED ORDER — SODIUM CHLORIDE 0.9 % IV SOLN
2.0000 g | INTRAVENOUS | Status: DC
  Administered 2022-07-26 – 2022-07-27 (×2): 2 g via INTRAVENOUS
  Filled 2022-07-25 (×2): qty 20

## 2022-07-25 MED ORDER — LEVOTHYROXINE SODIUM 100 MCG PO TABS
100.0000 ug | ORAL_TABLET | Freq: Every day | ORAL | Status: DC
  Administered 2022-07-25 – 2022-07-28 (×4): 100 ug via ORAL
  Filled 2022-07-25 (×4): qty 1

## 2022-07-25 MED ORDER — MIDODRINE HCL 5 MG PO TABS
10.0000 mg | ORAL_TABLET | Freq: Three times a day (TID) | ORAL | Status: DC
  Administered 2022-07-25 – 2022-07-27 (×8): 10 mg via ORAL
  Filled 2022-07-25 (×8): qty 2

## 2022-07-25 MED ORDER — LIDOCAINE 5 % EX PTCH
1.0000 | MEDICATED_PATCH | CUTANEOUS | Status: DC
  Administered 2022-07-25 – 2022-07-28 (×4): 1 via TRANSDERMAL
  Filled 2022-07-25 (×4): qty 1

## 2022-07-25 MED ORDER — LACTATED RINGERS IV BOLUS
500.0000 mL | Freq: Once | INTRAVENOUS | Status: AC
Start: 2022-07-25 — End: 2022-07-25
  Administered 2022-07-25: 500 mL via INTRAVENOUS

## 2022-07-25 MED ORDER — LACTATED RINGERS IV SOLN: INTRAVENOUS | Status: DC

## 2022-07-25 MED ORDER — ONDANSETRON HCL 4 MG PO TABS: 4.0000 mg | ORAL_TABLET | Freq: Four times a day (QID) | ORAL | Status: DC | PRN

## 2022-07-25 MED ORDER — FESOTERODINE FUMARATE ER 4 MG PO TB24
4.0000 mg | ORAL_TABLET | Freq: Every day | ORAL | Status: DC
  Administered 2022-07-25 – 2022-07-28 (×4): 4 mg via ORAL
  Filled 2022-07-25 (×4): qty 1

## 2022-07-25 MED ORDER — SODIUM CHLORIDE 0.9 % IV SOLN
2.0000 g | Freq: Once | INTRAVENOUS | Status: AC
  Administered 2022-07-25: 2 g via INTRAVENOUS
  Filled 2022-07-25: qty 20

## 2022-07-25 MED ORDER — ONDANSETRON HCL 4 MG/2ML IJ SOLN: 4.0000 mg | Freq: Four times a day (QID) | INTRAMUSCULAR | Status: DC | PRN

## 2022-07-25 MED ORDER — LACTATED RINGERS IV SOLN: INTRAVENOUS | Status: AC

## 2022-07-25 MED ORDER — MIRTAZAPINE 15 MG PO TABS
7.5000 mg | ORAL_TABLET | Freq: Every day | ORAL | Status: DC
  Administered 2022-07-25 – 2022-07-27 (×3): 7.5 mg via ORAL
  Filled 2022-07-25 (×3): qty 1

## 2022-07-25 MED ORDER — OXYCODONE HCL 5 MG PO TABS
5.0000 mg | ORAL_TABLET | ORAL | Status: DC | PRN
  Administered 2022-07-25 – 2022-07-26 (×4): 5 mg via ORAL
  Filled 2022-07-25 (×5): qty 1

## 2022-07-25 MED ORDER — DILTIAZEM HCL ER COATED BEADS 120 MG PO CP24
120.0000 mg | ORAL_CAPSULE | Freq: Every day | ORAL | Status: DC
  Administered 2022-07-25 – 2022-07-28 (×4): 120 mg via ORAL
  Filled 2022-07-25 (×4): qty 1

## 2022-07-25 NOTE — Progress Notes (Signed)
Tasha Roberts is a 81 y.o. female with medical history significant of chronic constipation, clotting disorder, depression, diabetes mellitus type 2, GERD, breast cancer, hyperlipidemia, hypertension, hypothyroidism, Tasha Roberts liver cirrhosis, paroxysmal atrial fibrillation, and more primarily presented to the ED with decreasing alertness and worsening confusion.  She continues to have ongoing back pain related to L2 compression fracture and has plans for outpatient lumbar MRI on 8/30 as well as follow-up with neurosurgery Dr. Arnoldo Morale outpatient.  She has been admitted with acute metabolic encephalopathy secondary to UTI and remains on Rocephin empirically with cultures pending.  CT head with no acute findings noted.  Continue medications as noted with time-limited IV fluid and follow a.m. labs.  TSH 4.145.  Patient seen and evaluated at bedside and has been admitted after midnight.  She still appears quite confused.  Discussion had with daughter on phone 8/18 who states that the main concern is her worsening alertness and confusion.  She was recently seen in the ED 2 days prior for her back pain.  PT evaluation pending for this and TLSO brace ordered as well to assist with symptom management until further outpatient follow-up.  Continue IV antibiotics for now and follow urine cultures in a.m along with repeat labs.  Total care time: 35 minutes.

## 2022-07-25 NOTE — H&P (Signed)
History and Physical    Patient: Tasha Roberts JQZ:[id number] DOB: [birth year] DOA: 07/24/2022 DOS: the patient was seen and examined on 07/25/2022 PCP: Lindell Spar, MD  Patient coming from: Home  Chief Complaint:  Chief Complaint  Patient presents with   Back Pain   HPI: Tasha Roberts is a 81 y.o. female with medical history significant of chronic constipation, clotting disorder, depression, diabetes mellitus type 2, GERD, breast cancer, hyperlipidemia, hypertension, hypothyroidism, Karlene Lineman liver cirrhosis, paroxysmal atrial fibrillation, and more presents to the ED with a chief complaint of back pain that is not getting better.  Patient was recently diagnosed with a compression fracture on 8/16.  She was started on oxycodone.  Prior to that ER visit she had been given Flexeril and Lidoderm which also did not help her pain.  Patient is alert and oriented x3, but is not able to provide much history.  She reports that she gets confused sometimes.  However it was reported that she was confused beyond her baseline as well.  Patient reports that her pain is intermittent.  When it is there it is sharp and severe.  It is worse with movement/postural changes.  Being in head of bed inclined position relieves the pain somewhat.  She has no urinary symptoms.  Patient does report constipation, but cannot remember when her last bowel movement was.  She thinks it must of been in the last few days.  She does have a history of chronic constipation.  She reports no difficulty with controlling her bowels or bladder.  She has no saddle anesthesia.  Patient reports no trauma.  She is covered in bruises, while she is on Eliquis, it still seems better must of been some kind of trauma.  Much of this history can be cleared up when family can be reached.  Patient has no other complaints at this time.   Review of Systems: As mentioned in the history of present illness. All other systems reviewed and are negative.  Accuracy of  review of systems is questionable given patient's confusion and memory problems. Past Medical History:  Diagnosis Date   Anemia due to blood loss    iron infusion's last one 03-25-2021 (pt bleeding post vulvar bx 02-11-2021, hospiatal admission 02-21-2021)   Anticoagulant long-term use    eliquis--- managed by pcp    Arthritis    hands   Bronchiectasis [facility])    pulmonology --- dr Halford Chessman   (04-01-2021  pt stated no supplemental oxygen use since 01/ 2022 and no inhaler use,  stated checks O2 at home during the day 98-99%)   Cancer ([place]) 2013   Lumpectomy left breast   Cataract 2015   Chronic constipation    Clotting disorder ([place])    On eliquis   Coronary artery calcification    per lexiscan 08-17-2019 result in care everywhere   Depression, major, single episode, moderate ([place]) 12/05/2021   DM II (diabetes mellitus, type II), controlled ([place]) 08/27/2005   DOE (dyspnea on exertion)    04-01-2021  per pt only going up stairs    Gastroesophageal reflux disease 12/05/2021   History of diabetes mellitus, type II    04-01-2021  per pt no issues after weight loss, last taken medication approx 2017   History of Graves' disease 2017   s/p RAI   History of left breast cancer    dx 2013,  s/p left partial mastecotmy w/ node dissection 09-15-2012, low grade DCIS, no chemo, completed radiation 02/ 2014,  per pt  no recurrence   History of respiratory syncytial virus (RSV) infection 09/2020   w/ hypoxia  , hospital admission 10/ 2021  and acute exacerbation bronchiectasis;  follow up post hospital pulmonogy dr Halford Chessman  note in epic   Hyperlipidemia    Hypertension    followed by pcp   (pt had lexiscan in care everywhere dated 08-17-2019 no ischemia , non-obstructive extensive calcification, ef 69%)   Hypothyroidism    Hypothyroidism, postradioiodine therapy    followed by pcp   Liver cirrhosis secondary to NASH (nonalcoholic steatohepatitis) ([place])    followed by dr Merrilee Jansky ([facility])---- dx 2004,  compensated ,  last liver bx (03/ 2015) fibrosis stage 3;  moderate portal hypertensive gastrophy   PAF (paroxysmal atrial fibrillation) ([place]) previous cardiologist lov note w/ Suann Larry PA ([facility] in [place]) dated 01-24-2020 in care everywhere;  (04-01-2021 per pt has appt w/ new cardiologist , dr j. branch 04-30-2021)   first dx 2020--  had event monitor/ stress test/ echo all results in care everywhere (monitor 08-03-2019 short runs AFib conversion pause <2 secconds, rate controlled;  echo 12-14-2019 ef 60%, mild concentric LVH, G2DD, RSVP 50.62mHg)   Personal history of radiation therapy 2013   left breast cancer,  completed 02/ 2014   PONV (postoperative nausea and vomiting)    Sarcoidosis, lung ([facility] pulmonology--- dr sHalford Chessman  04-01-2021  per pt dx age 89s, no issue since   Thrombocytopenia ([facility]    hematology/ oncology--- dr s. kDelton Coombes  VIN III (vulvar intraepithelial neoplasia III)    Past Surgical History:  Procedure Laterality Date   [place]  BREAST SURGERY  09/15/2012   CATARACT EXTRACTION W/ INTRAOCULAR LENS  IMPLANT, BILATERAL  2016   CHOLECYSTECTOMY OPEN  10160  CO2 LASER APPLICATION N/A 010/93/2355  Procedure: CO2 LASER APPLICATION OF THE VULVA;  Surgeon: REveritt Amber MD;  [place]  Service: Gynecology;  Laterality: N/A;   COLONOSCOPY WITH ESOPHAGOGASTRODUODENOSCOPY (EGD)  last one 08/ 2018  @[facility]    EYE SURGERY  2015   Cataract removed both eyes   LUMBAR LAMINECTOMY  2009   PARTIAL MASTECTOMY WITH AXILLARY SENTINEL LYMPH NODE BIOPSY [facility]15-2012   [facility]  TUBAL LIGATION  08/08/1969   Social History:  reports that she has never smoked. She has never used smokeless tobacco. She reports that she does not currently use alcohol. She reports that she does not use drugs.  Allergies  Allergen Reactions   Dicyclomine Other  (See Comments)    Dry cough   Glucophage [Metformin] Diarrhea   Losartan Other (See Comments)    Heartburn     Family History  Problem Relation Age of Onset   Hypertension Mother    Dementia Mother    Vision loss Mother    Heart disease Father    Hearing loss Father    Stroke Maternal Grandfather    Heart disease Brother     Prior to Admission medications   Medication Sig Start Date End Date Taking? Authorizing Provider  apixaban (ELIQUIS) 5 MG TABS tablet TAKE (1) TABLET BY MOUTH TWICE DAILY. Patient taking differently: Take 5 mg by mouth 2 (two) times daily. 09/19/21   BArnoldo Lenis MD  atorvastatin (LIPITOR) 20 MG tablet TAKE (1) TABLET BY MOUTH ONCE DAILY. Patient taking differently: Take 20 mg by mouth daily. 03/05/22  Lindell Spar, MD  Cholecalciferol 50 MCG (2000 UT) CAPS Take 1 capsule (2,000 Units total) by mouth daily. 03/30/22   Antonieta Pert, MD  clobetasol ointment (TEMOVATE) 0.05 % Apply topically at bedtime. 01/08/22   [provider]  cyclobenzaprine (FLEXERIL) 10 MG tablet Take 1 tablet (10 mg total) by mouth 2 (two) times daily as needed for muscle spasms. 07/12/22   Myna Bright M, PA-C  diltiazem (CARDIZEM CD) 120 MG 24 hr capsule TAKE (1) CAPSULE BY MOUTH ONCE DAILY. 05/16/22   Arnoldo Lenis, MD  diltiazem (TIAZAC) 120 MG 24 hr capsule Take 120 mg by mouth daily.    [provider]  famotidine (PEPCID) 20 MG tablet Take 1 tablet (20 mg total) by mouth 2 (two) times daily as needed for heartburn or indigestion. 12/05/21   Lindell Spar, MD  hyoscyamine (LEVSIN) 0.125 MG tablet Take 1 tablet (0.125 mg total) by mouth every 6 (six) hours as needed for cramping. 04/07/22   Lindell Spar, MD  levothyroxine (SYNTHROID) 100 MCG tablet Take 1 tablet (100 mcg total) by mouth daily before breakfast. 06/09/22   Lindell Spar, MD  lidocaine (LIDODERM) 5 % Place 1 patch onto the skin daily. Remove & Discard patch within 12 hours or as directed by MD  07/12/22   Hendricks Limes, PA-C  lisinopril-hydrochlorothiazide (ZESTORETIC) 20-25 MG tablet TAKE (1) TABLET BY MOUTH EACH MORNING. 06/09/22   Lindell Spar, MD  mirabegron ER (MYRBETRIQ) 25 MG TB24 tablet Take 1 tablet (25 mg total) by mouth daily. 04/16/22   McKenzie, Candee Furbish, MD  mirtazapine (REMERON) 7.5 MG tablet Take 1 tablet (7.5 mg total) by mouth at bedtime. 12/05/21   Lindell Spar, MD  nitroGLYCERIN (NITROSTAT) 0.4 MG SL tablet Place 1 tablet (0.4 mg total) under the tongue every 5 (five) minutes as needed for chest pain. 07/31/21   Lindell Spar, MD  oxyCODONE (OXY IR/ROXICODONE) 5 MG immediate release tablet Take 1 tablet (5 mg total) by mouth every 4 (four) hours as needed for severe pain. 07/23/22   Veryl Speak, MD  solifenacin (VESICARE) 5 MG tablet Take 1 tablet (5 mg total) by mouth daily. 05/16/22   McKenzie, Candee Furbish, MD  sulfamethoxazole-trimethoprim (BACTRIM DS) 800-160 MG tablet Take 1 tablet by mouth 2 (two) times daily. 06/19/22   Summerlin, Berneice Heinrich, PA-C  tolterodine (DETROL LA) 4 MG 24 hr capsule Take 1 capsule (4 mg total) by mouth daily. 06/16/22   McKenzie, Candee Furbish, MD  ursodiol (ACTIGALL) 250 MG tablet Take 250 mg by mouth 2 (two) times daily.    [provider]  vitamin B-12 (CYANOCOBALAMIN) 500 MCG tablet Take 500 mcg by mouth daily.    [provider]    Physical Exam: Vitals:   07/25/22 0420 07/25/22 0430 07/25/22 0440 07/25/22 0529  BP: (!) 117/45 (!) 111/44 (!) 111/51 (!) 115/57  Pulse: 64 66 62 71  Resp: 17 16 17 20   Temp:    98.5 F (36.9 C)  SpO2: 100% 100% 100% 98%  Weight:      Height:       1.  General: Patient lying supine in bed,  no acute distress   2. Psychiatric: Alert and oriented x 3, mood and behavior normal for situation, pleasant and cooperative with exam   3. Neurologic: Speech and language are normal, face is symmetric, moves all 4 extremities voluntarily, at baseline without acute deficits on  limited exam   4.  HEENMT:  Head is atraumatic, normocephalic, pupils reactive to light, neck is supple, trachea is midline, mucous membranes are moist   5. Respiratory : Lungs are clear to auscultation bilaterally without wheezing, rhonchi, rales, no cyanosis, no increase in work of breathing or accessory muscle use   6. Cardiovascular : Heart rate normal, rhythm is regular, no murmurs, rubs or gallops, no peripheral edema, peripheral pulses palpated   7. Gastrointestinal:  Abdomen is soft, nondistended, tender in the left lower quadrant, bowel sounds active, no masses or organomegaly palpated   8. Skin:  Skin tear on left hand, bruising to right arm, left leg, and multiple other small bruises   9.Musculoskeletal:  No acute deformities, no asymmetry in tone, no peripheral edema, peripheral pulses palpated, no tenderness to palpation in the extremities  Data Reviewed: In the ED Temp 98.3, heart rate 64-125, respiratory rate 11-28, blood pressure 89/56-156/91, satting at 100% No leukocytosis with white blood cell count of 8.2, hemoglobin 10.4, platelets 127 Chemistry reveals a BUN of 46, creatinine of 1.12, very near to baseline of 0.99 UA is indicative of UTI CT head shows no acute abnormality EKG shows a heart rate of 68, sinus rhythm, QTc 425 Patient was given oxycodone, normal saline 500 mL bolus due to hypotension, Rocephin, diltiazem in the ED CT lumbar spine from August 16 shows acute appearing compression fracture of L2 with approximately 30% vertebral body height loss and 4 mm retropulsion of the posterior inferior cortex.  Patient was discharged with recommendation to follow-up with neurosurgery - Admission was requested for acute metabolic encephalopathy   Assessment and Plan: * Acute metabolic encephalopathy - CT head shows no acute abnormality - Patient reports that she does get confused at baseline sometimes and that she lives with her daughter - There was some  concern that her encephalopathy was triggered by recently started oxycodone for compression fracture; this is likely a factor - UTI could also be contributing - At a more reasonable hour for contacting family, we will likely get a better idea of what her baseline is and how she is different from that now - Check TSH given history of hypothyroidism - Continue to monitor  Compression fracture of L2 ([place]) - Diagnosed August 16 - Patient was discharged from ER with recommendation to follow-up with neurosurgery - She was given oxycodone and had already been given Flexeril prior to that - If pain cannot be controlled, she may need transfer to [facility] for evaluation by neurosurgery and patient  UTI (urinary tract infection) Continue Rocephin Urine culture pending  SIRS (systemic inflammatory response syndrome) ([place]) -Coronary as high as 125, respiratory rate 28, and blood pressure 89/56 -Only endorgan damage would be the encephalopathy, but it is unclear if she is actually encephalopathic or at her baseline -Also some of these vital sign abnormalities are likely secondary to pain -UA indicative of UTI -Continue Rocephin -Urine culture pending -Procalcitonin pending -Continue to monitor  Mixed hyperlipidemia Continue statin  Gastroesophageal reflux disease Continue famotidine  Acquired hypothyroidism -continue synthroid -given acute metabolic encephalopathy, check TSH  Essential hypertension - Holding hydrochlorothiazide/lisinopril in the setting of hypotension with blood pressure as low as 89/56 in the ER  AF (paroxysmal atrial fibrillation) ([place]) - A-fib with RVR at presentation - RVR likely secondary to infection/UTI - Heart rate as high as 125 - Normalized with 1 injection of 10 mg diltiazem - Monitor on telemetry - Continue Eliquis - Continue oral Cardizem - Continue to monitor  Advance Care Planning:   Code Status: Full Code   Consults: None  Family  Communication: No family at bedside  Severity of Illness: The appropriate patient status for this patient is INPATIENT. Inpatient status is judged to be reasonable and necessary in order to provide the required intensity of service to ensure the patient's safety. The patient's presenting symptoms, physical exam findings, and initial radiographic and laboratory data in the context of their chronic comorbidities is felt to place them at high risk for further clinical deterioration. Furthermore, it is not anticipated that the patient will be medically stable for discharge from the hospital within 2 midnights of admission.   * I certify that at the point of admission it is my clinical judgment that the patient will require inpatient hospital care spanning beyond 2 midnights from the point of admission due to high intensity of service, high risk for further deterioration and high frequency of surveillance required.*  Author: Rolla Plate, DO 07/25/2022 5:44 AM  For on call review [email or web address].

## 2022-07-25 NOTE — Plan of Care (Signed)
  Problem: Acute Rehab PT Goals(only PT should resolve) Goal: Pt Will Go Supine/Side To Sit Outcome: Progressing Flowsheets (Taken 07/25/2022 1457) Pt will go Supine/Side to Sit: with minimal assist Goal: Pt Will Go Sit To Supine/Side Outcome: Progressing Flowsheets (Taken 07/25/2022 1457) Pt will go Sit to Supine/Side: with minimal assist Goal: Patient Will Transfer Sit To/From Stand Outcome: Progressing Flowsheets (Taken 07/25/2022 1457) Patient will transfer sit to/from stand: with minimal assist Goal: Pt Will Transfer Bed To Chair/Chair To Bed Outcome: Progressing Flowsheets (Taken 07/25/2022 1457) Pt will Transfer Bed to Chair/Chair to Bed: with min assist Goal: Pt Will Ambulate Outcome: Progressing Flowsheets (Taken 07/25/2022 1457) Pt will Ambulate:  15 feet  with rolling walker  with minimal assist Goal: Pt/caregiver will Perform Home Exercise Program Outcome: Progressing Flowsheets (Taken 07/25/2022 1457) Pt/caregiver will Perform Home Exercise Program:  For increased strengthening  For improved balance  Independently  2:58 PM, 07/25/22 Mearl Latin PT, DPT Physical Therapist at [facility]

## 2022-07-25 NOTE — Assessment & Plan Note (Signed)
-  Coronary as high as 125, respiratory rate 28, and blood pressure 89/56 -Only endorgan damage would be the encephalopathy, but it is unclear if she is actually encephalopathic or at her baseline -Also some of these vital sign abnormalities are likely secondary to pain -UA indicative of UTI -Continue Rocephin -Urine culture pending -Procalcitonin pending -Continue to monitor

## 2022-07-25 NOTE — Assessment & Plan Note (Signed)
Chronic atrial fibrillation with RVR.  Patient has converted to sinus rhythm.  Plan to continue diltiazem for rate control and apixaban for anticoagulation.  Continue telemetry monitoring.

## 2022-07-25 NOTE — Assessment & Plan Note (Signed)
Pain with improved control but not back to baseline.   Plan to continue with acetaminophen, change to scheduled dosing.  Continue oxycodone and add tid flexeril. Out of bed to chair tid with meals, Pt and Ot.

## 2022-07-25 NOTE — Assessment & Plan Note (Addendum)
Severe sepsis, present on admission.   Urine culture positive for Klebsiella, sensitive to cephalosporins.  Change to cephalexin 500 mg tid.

## 2022-07-25 NOTE — Assessment & Plan Note (Signed)
Continue famotidine

## 2022-07-25 NOTE — Assessment & Plan Note (Signed)
Multifactorial encephalopathy in the setting or persistent back pain and urinary tract infection.  Her mentation has improved.  Plan to continue supportive medical therapy, out of bed to chair tid with meals. Pt and Ot.

## 2022-07-25 NOTE — Assessment & Plan Note (Signed)
Patient has been placed on midodrine for blood pressure support Currently off antihypertensive medications.   Systolic blood pressure 894 to 130 range.

## 2022-07-25 NOTE — TOC Initial Note (Signed)
Transition of Care [facility]) - Initial/Assessment Note    Patient Details  Name: Tasha Roberts MRN: [id number] Date of Birth: 24-Aug-1941  Transition of Care [facility]) CM/SW Contact:    Boneta Lucks, RN Phone Number: 07/25/2022, 3:45 PM  Clinical Narrative:       Patient admitted with acute metabolic encephalopathy. Patient lives with her daughter, uses a Radiation protection practitioner. PT is recommending SNF.  Patient states she is weak, she has not been her long and wants to see how she does. TOC spoke with her daughter, Tasha Roberts. She feels after she gets better her in the hospital, he can handle her at home with HHPT.  DC planning for the weekend.  TOC to follow.      Expected Discharge Plan: [place] Barriers to Discharge: Continued Medical Work up   Patient Goals and CMS Choice Patient states their goals for this hospitalization and ongoing recovery are:: to go home. CMS Medicare.gov Compare Post Acute Care list provided to:: Patient Choice offered to / list presented to : Patient  Expected Discharge Plan and Services Expected Discharge Plan: [place]      Living arrangements for the past 2 months: Single Family Home          Prior Living Arrangements/Services Living arrangements for the past 2 months: Single Family Home Lives with:: Adult Children Patient language and need for interpreter reviewed:: Yes        Need for Family Participation in Patient Care: Yes (Comment) Care giver support system in place?: Yes (comment) Current home services: DME Criminal Activity/Legal Involvement Pertinent to Current Situation/Hospitalization: No - Comment as needed  Activities of Daily Living Home Assistive Devices/Equipment: Walker (specify type) ADL Screening (condition at time of admission) Patient's cognitive ability adequate to safely complete daily activities?: No Is the patient deaf or have difficulty hearing?: No Does the patient have difficulty seeing, even when  wearing glasses/contacts?: No Does the patient have difficulty concentrating, remembering, or making decisions?: Yes Patient able to express need for assistance with ADLs?: Yes Does the patient have difficulty dressing or bathing?: Yes Independently performs ADLs?: No Communication: Needs assistance Is this a change from baseline?: Pre-admission baseline Dressing (OT): Needs assistance Is this a change from baseline?: Change from baseline, expected to last >3 days Grooming: Needs assistance Is this a change from baseline?: Change from baseline, expected to last >3 days Feeding: Independent Bathing: Needs assistance Is this a change from baseline?: Change from baseline, expected to last >3 days Toileting: Needs assistance Is this a change from baseline?: Change from baseline, expected to last >3days In/Out Bed: Needs assistance Is this a change from baseline?: Change from baseline, expected to last >3 days Walks in Home: Needs assistance Is this a change from baseline?: Change from baseline, expected to last >3 days Does the patient have difficulty walking or climbing stairs?: Yes Weakness of Legs: Both Weakness of Arms/Hands: None  Permission Sought/Granted   Emotional Assessment      Orientation: : Oriented to Self, Oriented to Place, Oriented to Situation Alcohol / Substance Use: Not Applicable Psych Involvement: No (comment)  Admission diagnosis:  Atrial fibrillation with rapid ventricular response ([place]) [I48.91] Acute cystitis without hematuria [N30.00] Altered mental status, unspecified altered mental status type [V74.82] Acute metabolic encephalopathy [L07.86] Patient Active Problem List   Diagnosis Date Noted   Acute metabolic encephalopathy 75/44/9201   SIRS (systemic inflammatory response syndrome) ([place]) 07/25/2022   UTI (urinary tract infection) 07/25/2022   Compression fracture  of L2 ([place]) 07/25/2022   Hospital discharge follow-up 04/07/2022   Abdominal cramping  04/07/2022   Left ureteral stone 03/28/2022   Nausea & vomiting 03/28/2022   Sepsis secondary to UTI ([place]) 03/27/2022   Serrated adenoma of colon 01/09/2022   Depression, major, single episode, moderate ([place]) 12/05/2021   Gastroesophageal reflux disease 12/05/2021   Physical deconditioning 12/05/2021   Mixed hyperlipidemia 12/05/2021   Essential hypertension 07/31/2021   Age related osteoporosis 07/31/2021   Acquired hypothyroidism 07/31/2021   Chronic diarrhea 07/31/2021   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   Iron deficiency anemia due to chronic blood loss 03/04/2021   Thrombocytopenia ([place]) 03/04/2021   Acquired thrombophilia ([place]) 02/21/2021   CAD (coronary artery disease)    Lichen planus 34/28/7681   Bronchiectasis with (acute) exacerbation ([place]) 10/02/2020   AF (paroxysmal atrial fibrillation) ([place]) 10/02/2020   Liver cirrhosis secondary to NASH ([place]) 10/02/2020   Graves disease 12/22/2014   Obesity 15/72/6203   Nonalcoholic steatohepatitis ([place]) 10/12/2012   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   Vitamin D deficiency 04/13/2007   Angina pectoris ([place]) 08/27/2005   DM II (diabetes mellitus, type II), controlled ([place]) 08/27/2005   Sarcoidosis 1965   PCP:  Lindell Spar, MD Pharmacy:   [facility] [place] [address] [place] [address] Phone: [phone] Fax: [phone]   Readmission Risk Interventions    07/25/2022    3:44 PM  Readmission Risk Prevention Plan  Transportation Screening Complete  HRI or Home Care Consult Complete  Social Work Consult for [place] Planning/Counseling Complete  Palliative Care Screening Complete  Medication Review Press photographer) Complete

## 2022-07-25 NOTE — Assessment & Plan Note (Signed)
Continue with levothyroxine.

## 2022-07-25 NOTE — Evaluation (Signed)
Physical Therapy Evaluation Patient Details Name: Tasha Roberts MRN: [id number] DOB: 1941-06-11 Today's Date: 07/25/2022  History of Present Illness  Dagny Fiorentino is a 81 y.o. female with medical history significant of chronic constipation, clotting disorder, depression, diabetes mellitus type 2, GERD, breast cancer, hyperlipidemia, hypertension, hypothyroidism, [place] liver cirrhosis, paroxysmal atrial fibrillation, and more presents to the ED with a chief complaint of back pain that is not getting better.  Patient was recently diagnosed with a compression fracture on 8/16.  She was started on oxycodone.  Prior to that ER visit she had been given Flexeril and Lidoderm which also did not help her pain.  Patient is alert and oriented x3, but is not able to provide much history.  She reports that she gets confused sometimes.  However it was reported that she was confused beyond her baseline as well.  Patient reports that her pain is intermittent.  When it is there it is sharp and severe.  It is worse with movement/postural changes.  Being in head of bed inclined position relieves the pain somewhat.  She has no urinary symptoms.  Patient does report constipation, but cannot remember when her last bowel movement was.  She thinks it must of been in the last few days.  She does have a history of chronic constipation.  She reports no difficulty with controlling her bowels or bladder.  She has no saddle anesthesia.  Patient reports no trauma.  She is covered in bruises, while she is on Eliquis, it still seems better must of been some kind of trauma.  Much of this history can be cleared up when family can be reached.  Patient has no other complaints at this time.   Clinical Impression  Patient limited for functional mobility as stated below secondary to back pain, BLE weakness, fatigue and poor standing balance. Patient requires assist for all mobility due to weakness and pain. She demonstrates fair sitting tolerance at  EOB and requires assist to transfer to standing with RW. She is limited to a few small, lateral steps at bedside and is assisted back to bed at end of session. Patient will benefit from continued physical therapy in hospital and recommended venue below to increase strength, balance, endurance for safe ADLs and gait.        Recommendations for follow up therapy are one component of a multi-disciplinary discharge planning process, led by the attending physician.  Recommendations may be updated based on patient status, additional functional criteria and insurance authorization.  Follow Up Recommendations Skilled nursing-short term rehab (<3 hours/day) Can patient physically be transported by private vehicle: No    Assistance Recommended at Discharge Intermittent Supervision/Assistance  Patient can return home with the following  A lot of help with walking and/or transfers;A lot of help with bathing/dressing/bathroom;Assistance with cooking/housework    Equipment Recommendations None recommended by PT  Recommendations for Other Services       Functional Status Assessment Patient has had a recent decline in their functional status and demonstrates the ability to make significant improvements in function in a reasonable and predictable amount of time.     Precautions / Restrictions Precautions Precautions: Fall Restrictions Weight Bearing Restrictions: No      Mobility  Bed Mobility Overal bed mobility: Needs Assistance Bed Mobility: Supine to Sit, Sit to Supine     Supine to sit: Mod assist, HOB elevated Sit to supine: Mod assist   General bed mobility comments: labored, assist to upright trunk and transition to seated EoB  and for assist back into bed    Transfers Overall transfer level: Needs assistance Equipment used: Rolling walker (2 wheels) Transfers: Sit to/from Stand Sit to Stand: Mod assist                Ambulation/Gait Ambulation/Gait assistance: Min  assist, Mod assist Gait Distance (Feet): 2 Feet Assistive device: Rolling walker (2 wheels)   Gait velocity: decreased     General Gait Details: limited to a few shuffled lateral steps at bedside  Stairs            Wheelchair Mobility    Modified Rankin (Stroke Patients Only)       Balance Overall balance assessment: Needs assistance Sitting-balance support: Feet supported, No upper extremity supported Sitting balance-Leahy Scale: Fair Sitting balance - Comments: seated EOB   Standing balance support: Bilateral upper extremity supported Standing balance-Leahy Scale: Fair Standing balance comment: with RW                             Pertinent Vitals/Pain Pain Assessment Pain Assessment: Faces Faces Pain Scale: Hurts even more Pain Location: back Pain Descriptors / Indicators: Sore, Grimacing, Guarding Pain Intervention(s): Limited activity within patient's tolerance, Monitored during session, Repositioned    Home Living Family/patient expects to be discharged to:: Private residence Living Arrangements: Children Available Help at Discharge: Family Type of Home: Mobile home Home Access: Stairs to enter Entrance Stairs-Rails: Right;Left;Can reach both Entrance Stairs-Number of Steps: 4   Home Layout: One level Home Equipment: Conservation officer, nature (2 wheels) Additional Comments: patient with confusion and is unsure of all equipment    Prior Function Prior Level of Function : Independent/Modified Independent             Mobility Comments: patient questionable historian due to confusion but states household ambulation with RW ADLs Comments: patient unsure     Hand Dominance        Extremity/Trunk Assessment                Communication   Communication: No difficulties  Cognition Arousal/Alertness: Awake/alert, Suspect due to medications Behavior During Therapy: WFL for tasks assessed/performed, Flat affect Overall Cognitive Status: No  family/caregiver present to determine baseline cognitive functioning                                          General Comments      Exercises     Assessment/Plan    PT Assessment Patient needs continued PT services  PT Problem List Decreased strength;Decreased mobility;Decreased activity tolerance;Decreased balance;Decreased range of motion;Pain       PT Treatment Interventions DME instruction;Therapeutic exercise;Gait training;Balance training;Stair training;Neuromuscular re-education;Functional mobility training;Therapeutic activities;Patient/family education    PT Goals (Current goals can be found in the Care Plan section)  Acute Rehab PT Goals Patient Stated Goal: feel better PT Goal Formulation: With patient Time For Goal Achievement: 08/08/22 Potential to Achieve Goals: Fair    Frequency Min 3X/week     Co-evaluation               AM-PAC PT "6 Clicks" Mobility  Outcome Measure Help needed turning from your back to your side while in a flat bed without using bedrails?: A Lot Help needed moving from lying on your back to sitting on the side of a flat bed without using bedrails?: A Lot  Help needed moving to and from a bed to a chair (including a wheelchair)?: A Lot Help needed standing up from a chair using your arms (e.g., wheelchair or bedside chair)?: A Lot Help needed to walk in hospital room?: A Lot Help needed climbing 3-5 steps with a railing? : A Lot 6 Click Score: 12    End of Session   Activity Tolerance: Patient limited by pain;Patient limited by fatigue Patient left: in bed;with call bell/phone within reach;with bed alarm set Nurse Communication: Mobility status PT Visit Diagnosis: Unsteadiness on feet (R26.81);Other abnormalities of gait and mobility (R26.89);Muscle weakness (generalized) (M62.81)    Time: 2240-0180 PT Time Calculation (min) (ACUTE ONLY): 23 min   Charges:   PT Evaluation $PT Eval Low Complexity: 1 Low PT  Treatments $Therapeutic Activity: 8-22 mins        2:56 PM, 07/25/22 Mearl Latin PT, DPT Physical Therapist at [facility]

## 2022-07-25 NOTE — Assessment & Plan Note (Signed)
Continue statin. 

## 2022-07-25 NOTE — ED Notes (Signed)
Pt BP trending low. Hospitalist is made aware. 300 called and explained situation and the need for BP improvement before pt is transported upstairs.

## 2022-07-26 DIAGNOSIS — N390 Urinary tract infection, site not specified: Secondary | ICD-10-CM | POA: Diagnosis not present

## 2022-07-26 DIAGNOSIS — I480 Paroxysmal atrial fibrillation: Secondary | ICD-10-CM | POA: Diagnosis not present

## 2022-07-26 DIAGNOSIS — I10 Essential (primary) hypertension: Secondary | ICD-10-CM

## 2022-07-26 DIAGNOSIS — G9341 Metabolic encephalopathy: Secondary | ICD-10-CM | POA: Diagnosis not present

## 2022-07-26 LAB — BASIC METABOLIC PANEL
Anion gap: 6 (ref 5–15)
BUN: 24 mg/dL — ABNORMAL HIGH (ref 8–23)
CO2: 25 mmol/L (ref 22–32)
Calcium: 8.2 mg/dL — ABNORMAL LOW (ref 8.9–10.3)
Chloride: 106 mmol/L (ref 98–111)
Creatinine, Ser: 0.98 mg/dL (ref 0.44–1.00)
GFR, Estimated: 59 mL/min — ABNORMAL LOW (ref >60)
Glucose, Bld: 85 mg/dL (ref 70–99)
Potassium: 3.8 mmol/L (ref 3.5–5.1)
Sodium: 137 mmol/L (ref 135–145)

## 2022-07-26 LAB — CBC
HCT: 28.9 % — ABNORMAL LOW (ref 36.0–46.0)
Hemoglobin: 9.5 g/dL — ABNORMAL LOW (ref 12.0–15.0)
MCH: 30.4 pg (ref 26.0–34.0)
MCHC: 32.9 g/dL (ref 30.0–36.0)
MCV: 92.3 fL (ref 80.0–100.0)
Platelets: 149 10*3/uL — ABNORMAL LOW (ref 150–400)
RBC: 3.13 MIL/uL — ABNORMAL LOW (ref 3.87–5.11)
RDW: 14 % (ref 11.5–15.5)
WBC: 9.9 10*3/uL (ref 4.0–10.5)
nRBC: 0 % (ref 0.0–0.2)

## 2022-07-26 LAB — COMPREHENSIVE METABOLIC PANEL
ALT: 40 U/L (ref 0–44)
AST: 26 U/L (ref 15–41)
Albumin: 2.7 g/dL — ABNORMAL LOW (ref 3.5–5.0)
Alkaline Phosphatase: 101 U/L (ref 38–126)
Anion gap: 7 (ref 5–15)
BUN: 27 mg/dL — ABNORMAL HIGH (ref 8–23)
CO2: 26 mmol/L (ref 22–32)
Calcium: 8.6 mg/dL — ABNORMAL LOW (ref 8.9–10.3)
Chloride: 108 mmol/L (ref 98–111)
Creatinine, Ser: 0.92 mg/dL (ref 0.44–1.00)
GFR, Estimated: >60 mL/min (ref >60)
Glucose, Bld: 90 mg/dL (ref 70–99)
Potassium: 3.5 mmol/L (ref 3.5–5.1)
Sodium: 141 mmol/L (ref 135–145)
Total Bilirubin: 0.6 mg/dL (ref 0.3–1.2)
Total Protein: 5.3 g/dL — ABNORMAL LOW (ref 6.5–8.1)

## 2022-07-26 LAB — MAGNESIUM: Magnesium: 1.9 mg/dL (ref 1.7–2.4)

## 2022-07-26 MED ORDER — ENSURE ENLIVE PO LIQD
237.0000 mL | Freq: Two times a day (BID) | ORAL | Status: DC
Start: 2022-07-27 — End: 2022-07-28
  Administered 2022-07-27 – 2022-07-28 (×4): 237 mL via ORAL

## 2022-07-26 MED ORDER — METOPROLOL TARTRATE 5 MG/5ML IV SOLN
INTRAVENOUS | Status: AC
  Filled 2022-07-26: qty 5

## 2022-07-26 MED ORDER — METOPROLOL TARTRATE 5 MG/5ML IV SOLN
5.0000 mg | Freq: Once | INTRAVENOUS | Status: AC
  Administered 2022-07-26: 5 mg via INTRAVENOUS

## 2022-07-26 MED ORDER — CYCLOBENZAPRINE HCL 10 MG PO TABS
5.0000 mg | ORAL_TABLET | Freq: Three times a day (TID) | ORAL | Status: DC
  Administered 2022-07-26 – 2022-07-27 (×3): 5 mg via ORAL
  Filled 2022-07-26 (×4): qty 1

## 2022-07-26 MED ORDER — ACETAMINOPHEN 325 MG PO TABS
650.0000 mg | ORAL_TABLET | Freq: Four times a day (QID) | ORAL | Status: DC
  Administered 2022-07-26 – 2022-07-28 (×8): 650 mg via ORAL
  Filled 2022-07-26 (×8): qty 2

## 2022-07-26 NOTE — Progress Notes (Signed)
Called [facility] for TLSO brace per order.

## 2022-07-26 NOTE — Hospital Course (Addendum)
Tasha Roberts was admitted to the hospital with the working diagnosis of acute metabolic encephalopathy.   81 yo female with the past medical history of depression, T2DM, GERD, hypertension, cirrhosis, paroxysmal atrial fibrillation and hypothyroidism, who presented with back pain. Recent diagnosis of compression fracture on 08/16 and placed on oxycodone for pain control. At home patient continue to have pain, worse with movement. Noticed worsening confusion, prompting her to come to the ED. On her initial physical examination her blood pressure was 177/45, HR 64, R 17 and 02 saturation 100%, lungs with no wheezing or rales, heart with S1 and S2 present and rhythmic, abdomen soft and non tender, no lower extremity edema.   Na 142, K 4,0 CL 111 bicarbonate 24, glucose 107 bun 46 cr 1,17  Wbc 10,3 hgb 11,0 plt 154  Urine analysis with SG 1,016., wbc 11-20  Urine culture positive for klebsiella pneumonia 100,000 CFU   CT spine with acute appearing compression fracture of L2 with approximately 30% vertebral body hight loss and 4 mm retropulsion of the posterior inferior cortex.   CT head with no acute changes.   EKG 145 bpm, normal axis, normal qtc, atrial fibrillation rhythm with no significant ST segment or T wave changes.

## 2022-07-26 NOTE — Progress Notes (Signed)
Orthopedic Tech Progress Note Patient Details:  Tasha Roberts 1941-04-03 [id number]  Patient ID: [name], female   DOB: [birth year], 81 y.o.   MRN: [id number]  Order for a TLSO called into [facility] at [phone].    Carin Primrose 07/26/2022, 11:39 AM

## 2022-07-26 NOTE — Progress Notes (Signed)
Tele called stating patient is in afib. Vitals are hr 116, bp 108/64, resp 17, o2 98. MD Arrien notified. Scheduled cardizem given.

## 2022-07-26 NOTE — Progress Notes (Signed)
Progress Note   Patient: Tasha Roberts LEX:[id number] DOB: Sep 10, 1941 DOA: 07/24/2022     1 DOS: the patient was seen and examined on 07/26/2022   Brief hospital course: Mrs. Wyss was admitted to the hospital with the working diagnosis of acute metabolic encephalopathy.   81 yo female with the past medical history of depression, T2DM, GERD, hypertension, cirrhosis, paroxysmal atrial fibrillation and hypothyroidism, who presented with back pain. Recent diagnosis of compression fracture on 08/16 and placed on oxycodone for pain control. At home patient continue to have pain, worse with movement. Noticed worsening confusion, prompting her to come to the ED. On her initial physical examination her blood pressure was 177/45, HR 64, R 17 and 02 saturation 100%, lungs with no wheezing or rales, heart with S1 and S2 present and rhythmic, abdomen soft and non tender, no lower extremity edema.   Na 142, K 4,0 CL 111 bicarbonate 24, glucose 107 bun 46 cr 1,17  Wbc 10,3 hgb 11,0 plt 154  Urine analysis with SG 1,016., wbc 11-20  Urine culture positive for klebsiella pneumonia 100,000 CFU   CT spine with acute appearing compression fracture of L2 with approximately 30% vertebral body hight loss and 4 mm retropulsion of the posterior inferior cortex.   CT head with no acute changes.   EKG 145 bpm, normal axis, normal qtc, atrial fibrillation rhythm with no significant ST segment or T wave changes.     Assessment and Plan: * Acute metabolic encephalopathy Multifactorial encephalopathy in the setting or persistent back pain and urinary tract infection.  Her mentation has improved.  Plan to continue supportive medical therapy, out of bed to chair tid with meals. Pt and Ot.   AF (paroxysmal atrial fibrillation) (HCC) Chronic atrial fibrillation with RVR.  Patient has converted to sinus rhythm.  Plan to continue diltiazem for rate control and apixaban for anticoagulation.  Continue telemetry  monitoring.    Compression fracture of L2 (HCC) Pain with improved control but not back to baseline.   Plan to continue with acetaminophen, change to scheduled dosing.  Continue oxycodone and add tid flexeril. Out of bed to chair tid with meals, Pt and Ot.   Acquired hypothyroidism Continue with levothyroxine.   Essential hypertension Patient has been placed on midodrine for blood pressure support Currently off antihypertensive medications.   Systolic blood pressure 449 to 130 range.   Gastroesophageal reflux disease Continue famotidine  Mixed hyperlipidemia Continue statin  UTI (urinary tract infection) Severe sepsis, present on admission.   Urine culture positive for Klebsiella Plan to continue with IV ceftriaxone and follow up on culture sensitivities Hold on IV fluids.  Continue with midodrine for blood pressure support.         Subjective: Patient is feeling better, no chest pain or dyspnea, continue to have back pain, improved but not back to her baseline   Physical Exam: Vitals:   07/26/22 0043 07/26/22 0428 07/26/22 0908 07/26/22 1400  BP: 120/63 127/68 108/64 (!) 114/45  Pulse: 72 70 (!) 116 65  Resp: 20 20 17 18   Temp: 97.8 F (36.6 C) 98.9 F (37.2 C)  98.2 F (36.8 C)  TempSrc: Oral     SpO2: 91% 97% 98% 99%  Weight:      Height:       Neurology somnolent but easy to arouse ENT with mild pallor Cardiovascular with S1 and S2 present and rhythmic with no gallops or murmurs Respiratory with no rales or wheezing' Abdomen not distended or tender No  lower extremity edema  Data Reviewed:    Family Communication: no family at the bedside   Disposition: Status is: Inpatient Remains inpatient appropriate because: IV antibiotics   Planned Discharge Destination: Home      Author: Tawni Millers, MD 07/26/2022 4:34 PM  For on call review [email or web address].

## 2022-07-26 NOTE — Progress Notes (Addendum)
Patient has refused all meals today. This LPN offered an ensure, but patient refused as well. MD Arrien notified.

## 2022-07-27 DIAGNOSIS — I10 Essential (primary) hypertension: Secondary | ICD-10-CM | POA: Diagnosis not present

## 2022-07-27 DIAGNOSIS — K219 Gastro-esophageal reflux disease without esophagitis: Secondary | ICD-10-CM

## 2022-07-27 DIAGNOSIS — S32020S Wedge compression fracture of second lumbar vertebra, sequela: Secondary | ICD-10-CM | POA: Diagnosis not present

## 2022-07-27 DIAGNOSIS — I480 Paroxysmal atrial fibrillation: Secondary | ICD-10-CM | POA: Diagnosis not present

## 2022-07-27 DIAGNOSIS — G9341 Metabolic encephalopathy: Secondary | ICD-10-CM | POA: Diagnosis not present

## 2022-07-27 DIAGNOSIS — D649 Anemia, unspecified: Secondary | ICD-10-CM

## 2022-07-27 LAB — URINE CULTURE: Culture: >=100000 — AB

## 2022-07-27 LAB — CBC
HCT: 27.2 % — ABNORMAL LOW (ref 36.0–46.0)
Hemoglobin: 8.5 g/dL — ABNORMAL LOW (ref 12.0–15.0)
MCH: 30 pg (ref 26.0–34.0)
MCHC: 31.3 g/dL (ref 30.0–36.0)
MCV: 96.1 fL (ref 80.0–100.0)
Platelets: 125 10*3/uL — ABNORMAL LOW (ref 150–400)
RBC: 2.83 MIL/uL — ABNORMAL LOW (ref 3.87–5.11)
RDW: 14 % (ref 11.5–15.5)
WBC: 6.7 10*3/uL (ref 4.0–10.5)
nRBC: 0 % (ref 0.0–0.2)

## 2022-07-27 MED ORDER — CYCLOBENZAPRINE HCL 10 MG PO TABS
5.0000 mg | ORAL_TABLET | Freq: Three times a day (TID) | ORAL | Status: DC
  Administered 2022-07-27 – 2022-07-28 (×3): 5 mg via ORAL
  Filled 2022-07-27 (×3): qty 1

## 2022-07-27 MED ORDER — CEPHALEXIN 500 MG PO CAPS
500.0000 mg | ORAL_CAPSULE | Freq: Three times a day (TID) | ORAL | Status: DC
  Administered 2022-07-27 – 2022-07-28 (×2): 500 mg via ORAL
  Filled 2022-07-27 (×2): qty 1

## 2022-07-27 MED ORDER — CELECOXIB 100 MG PO CAPS
200.0000 mg | ORAL_CAPSULE | Freq: Two times a day (BID) | ORAL | Status: DC
Start: 2022-07-27 — End: 2022-07-28
  Filled 2022-07-27 (×2): qty 2

## 2022-07-27 MED ORDER — CELECOXIB 100 MG PO CAPS: 200.0000 mg | ORAL_CAPSULE | Freq: Two times a day (BID) | ORAL | Status: DC

## 2022-07-27 MED ORDER — SODIUM CHLORIDE 0.9 % IV BOLUS
500.0000 mL | Freq: Once | INTRAVENOUS | Status: AC
  Administered 2022-07-27: 500 mL via INTRAVENOUS

## 2022-07-27 NOTE — Progress Notes (Addendum)
Progress Note   Patient: Tasha Roberts GDJ:[id number] DOB: [birth year] DOA: 07/24/2022     2 DOS: the patient was seen and examined on 07/27/2022   Brief hospital course: Tasha Roberts was admitted to the hospital with the working diagnosis of acute metabolic encephalopathy in the setting of urinary tract infection and acute L 2 compression fracture.   81 yo female with the past medical history of depression, T2DM, GERD, hypertension, cirrhosis, paroxysmal atrial fibrillation and hypothyroidism, who presented with back pain. Recent diagnosis of compression fracture on 08/16 and placed on oxycodone for pain control. At home patient continue to have pain, worse with movement. Noticed worsening confusion, prompting her to come to the ED. On her initial physical examination her blood pressure was 177/45, HR 64, R 17 and 02 saturation 100%, lungs with no wheezing or rales, heart with S1 and S2 present and rhythmic, abdomen soft and non tender, no lower extremity edema.   Na 142, K 4,0 CL 111 bicarbonate 24, glucose 107 bun 46 cr 1,17  Wbc 10,3 hgb 11,0 plt 154  Urine analysis with SG 1,016., wbc 11-20  Urine culture positive for klebsiella pneumonia 100,000 CFU   CT spine with acute appearing compression fracture of L2 with approximately 30% vertebral body hight loss and 4 mm retropulsion of the posterior inferior cortex.   CT head with no acute changes.   EKG 145 bpm, normal axis, normal qtc, atrial fibrillation rhythm with no significant ST segment or T wave changes.   Patient has been placed on analgesics with improvement in her back pain but not yet back to baseline.   Assessment and Plan: * Acute metabolic encephalopathy Multifactorial encephalopathy in the setting or persistent back pain and urinary tract infection.  Encephalopathy has resolved, continue supportive medical therapy. Out of bed to chair tid with meals.   AF (paroxysmal atrial fibrillation) (HCC) Chronic atrial fibrillation with  RVR.  Patient has converted to sinus rhythm, HR 50 to 60 bpm.   Continue diltiazem for rate control and apixaban for anticoagulation.  Continue telemetry monitoring.    Compression fracture of L2 (HCC) Continue to improve back pain but still limited mobility.    Plan to continue with acetaminophen and flexeril.  Oxycodone and morphine for moderate and severe pain.  Add Celebrex bid Out of bed to chair, Pt and Ot.   Acquired hypothyroidism Continue with levothyroxine.   Essential hypertension Blood pressure has been stable with systolic 622 to 297 mmHg.  Plan to discontinue midodrine and continue close blood pressure monitoring.   Gastroesophageal reflux disease Continue famotidine  Mixed hyperlipidemia Continue statin  UTI (urinary tract infection) Severe sepsis, present on admission.   Urine culture positive for Klebsiella, sensitive to cephalosporins.  Change to cephalexin 500 mg tid.      Chronic anemia Anemia of chronic disease, her hgb has been stable at 8,5 and 9,5         Subjective: Patient with improvement in back pain, no chest pain or dyspnea, continue having difficulty moving, not back to her baseline   Physical Exam: Vitals:   07/26/22 2050 07/27/22 0553 07/27/22 0600 07/27/22 0832  BP: (!) 124/31 (!) 112/39 (!) 106/42 (!) 116/44  Pulse: (!) 55 (!) 52    Resp: 18 18    Temp: 97.7 F (36.5 C) (!) 97.4 F (36.3 C)    TempSrc: Oral Oral    SpO2: 99% 97%    Weight:      Height:  Neurology awake and alert Ent with no pallor Cardiovascular with S1 and S2 present and rhythmic with no gallops Respiratory with no rales or wheezing Abdomen with no distention  No lower extremity edema.  Data Reviewed:    Family Communication: no family at the bedside   Disposition: Status is: Inpatient Remains inpatient appropriate because: back pain   Planned Discharge Destination: Home    Author: Tawni Millers, MD 07/27/2022 1:16  PM  For on call review [email or web address].

## 2022-07-27 NOTE — Progress Notes (Signed)
Patient ate breakfast this morning.

## 2022-07-27 NOTE — Assessment & Plan Note (Signed)
Anemia of chronic disease, her hgb has been stable at 8,5 and 9,5

## 2022-07-28 ENCOUNTER — Ambulatory Visit (HOSPITAL_COMMUNITY): Payer: Medicare Other

## 2022-07-28 DIAGNOSIS — G9341 Metabolic encephalopathy: Secondary | ICD-10-CM | POA: Diagnosis not present

## 2022-07-28 LAB — BASIC METABOLIC PANEL
Anion gap: 8 (ref 5–15)
BUN: 17 mg/dL (ref 8–23)
CO2: 20 mmol/L — ABNORMAL LOW (ref 22–32)
Calcium: 8 mg/dL — ABNORMAL LOW (ref 8.9–10.3)
Chloride: 109 mmol/L (ref 98–111)
Creatinine, Ser: 0.92 mg/dL (ref 0.44–1.00)
GFR, Estimated: >60 mL/min (ref >60)
Glucose, Bld: 139 mg/dL — ABNORMAL HIGH (ref 70–99)
Potassium: 3.7 mmol/L (ref 3.5–5.1)
Sodium: 137 mmol/L (ref 135–145)

## 2022-07-28 MED ORDER — CYCLOBENZAPRINE HCL 5 MG PO TABS: 5.0000 mg | ORAL_TABLET | Freq: Three times a day (TID) | ORAL | 0 refills | Status: DC

## 2022-07-28 MED ORDER — CELECOXIB 200 MG PO CAPS: 200.0000 mg | ORAL_CAPSULE | Freq: Two times a day (BID) | ORAL | 0 refills | Status: DC

## 2022-07-28 MED ORDER — OXYCODONE HCL 5 MG PO TABS
5.0000 mg | ORAL_TABLET | ORAL | 0 refills | Status: DC | PRN
Start: 2022-07-28 — End: 2022-10-20

## 2022-07-28 MED ORDER — ENSURE ENLIVE PO LIQD: 237.0000 mL | Freq: Two times a day (BID) | ORAL | 12 refills | Status: DC

## 2022-07-28 NOTE — Discharge Summary (Signed)
Physician Discharge Summary  Tasha Roberts NID:[id number] DOB: 1941-06-25 DOA: 07/24/2022  PCP: Lindell Spar, MD  Admit date: 07/24/2022  Discharge date: 07/28/2022  Admitted From:Home  Disposition:  SNF  Recommendations for Outpatient Follow-up:  Follow up with PCP in 1-2 weeks Continue on medications as noted below Patient has follow-up lumbar MRI in 8/30 as well as follow-up with neurosurgery regarding compression fracture in the near future  Home Health: None  Equipment/Devices: TLSO brace  Discharge Condition:Stable  CODE STATUS: Full  Diet recommendation: Heart Healthy/carb modified  Brief/Interim Summary: 81 yo female with the past medical history of depression, T2DM, GERD, hypertension, cirrhosis, paroxysmal atrial fibrillation and hypothyroidism, who presented with back pain. Tasha Roberts was admitted to the hospital with the working diagnosis of acute metabolic encephalopathy in the setting of urinary tract infection and acute L 2 compression fracture.  Patient was treated with IV Rocephin for period of 3 days on account of acute metabolic encephalopathy secondary to UTI with Klebsiella pneumonia.  She was initially thought to have sepsis, but then this was ruled out.  She continued to have some low back pain related to her compression fracture at L2 and this will need to be followed up outpatient.  Pain management has been prescribed as noted below and she has seen PT with recommendations for SNF to which she is currently agreeable.  No other acute events noted throughout the course of the stay and she has completed treatment for UTI with no further need for antibiotics noted.  Discharge Diagnoses:  Principal Problem:   Acute metabolic encephalopathy Active Problems:   AF (paroxysmal atrial fibrillation) (HCC)   Compression fracture of L2 (HCC)   Acquired hypothyroidism   Essential hypertension   Gastroesophageal reflux disease   Mixed hyperlipidemia   UTI (urinary tract  infection)   Chronic anemia  Principal discharge diagnosis: Acute metabolic encephalopathy secondary to Klebsiella UTI-sepsis ruled out.  Compression fracture of L2.  Discharge Instructions  Discharge Instructions     Diet - low sodium heart healthy   Complete by: As directed    Increase activity slowly   Complete by: As directed       Allergies as of 07/28/2022       Reactions   Dicyclomine Other (See Comments)   Dry cough   Glucophage [metformin] Diarrhea   Losartan Other (See Comments)   Heartburn        Medication List     STOP taking these medications    lisinopril-hydrochlorothiazide 20-25 MG tablet Commonly known as: ZESTORETIC   sulfamethoxazole-trimethoprim 800-160 MG tablet Commonly known as: BACTRIM DS       TAKE these medications    atorvastatin 20 MG tablet Commonly known as: LIPITOR TAKE (1) TABLET BY MOUTH ONCE DAILY. What changed: See the new instructions.   celecoxib 200 MG capsule Commonly known as: CELEBREX Take 1 capsule (200 mg total) by mouth 2 (two) times daily.   Cholecalciferol 50 MCG (2000 UT) Caps Take 1 capsule (2,000 Units total) by mouth daily.   clobetasol ointment 0.05 % Commonly known as: TEMOVATE Apply topically at bedtime.   cyclobenzaprine 10 MG tablet Commonly known as: FLEXERIL Take 1 tablet (10 mg total) by mouth 2 (two) times daily as needed for muscle spasms. What changed: Another medication with the same name was added. Make sure you understand how and when to take each.   cyclobenzaprine 5 MG tablet Commonly known as: FLEXERIL Take 1 tablet (5 mg total) by mouth 3 (three)  times daily. What changed: You were already taking a medication with the same name, and this prescription was added. Make sure you understand how and when to take each.   diltiazem 120 MG 24 hr capsule Commonly known as: CARDIZEM CD TAKE (1) CAPSULE BY MOUTH ONCE DAILY.   Eliquis 5 MG Tabs tablet Generic drug: apixaban TAKE (1)  TABLET BY MOUTH TWICE DAILY. What changed: See the new instructions.   famotidine 20 MG tablet Commonly known as: Pepcid Take 1 tablet (20 mg total) by mouth 2 (two) times daily as needed for heartburn or indigestion.   feeding supplement Liqd Take 237 mLs by mouth 2 (two) times daily between meals.   hyoscyamine 0.125 MG tablet Commonly known as: Levsin Take 1 tablet (0.125 mg total) by mouth every 6 (six) hours as needed for cramping.   levothyroxine 100 MCG tablet Commonly known as: SYNTHROID Take 1 tablet (100 mcg total) by mouth daily before breakfast.   lidocaine 5 % Commonly known as: Lidoderm Place 1 patch onto the skin daily. Remove & Discard patch within 12 hours or as directed by MD   mirabegron ER 25 MG Tb24 tablet Commonly known as: MYRBETRIQ Take 1 tablet (25 mg total) by mouth daily.   mirtazapine 7.5 MG tablet Commonly known as: REMERON Take 1 tablet (7.5 mg total) by mouth at bedtime.   nitroGLYCERIN 0.4 MG SL tablet Commonly known as: NITROSTAT Place 1 tablet (0.4 mg total) under the tongue every 5 (five) minutes as needed for chest pain.   oxyCODONE 5 MG immediate release tablet Commonly known as: Oxy IR/ROXICODONE Take 1 tablet (5 mg total) by mouth every 4 (four) hours as needed for severe pain.   solifenacin 5 MG tablet Commonly known as: VESICARE Take 1 tablet (5 mg total) by mouth daily.   tolterodine 4 MG 24 hr capsule Commonly known as: Detrol LA Take 1 capsule (4 mg total) by mouth daily.   ursodiol 250 MG tablet Commonly known as: ACTIGALL Take 250 mg by mouth 2 (two) times daily.   vitamin B-12 500 MCG tablet Commonly known as: CYANOCOBALAMIN Take 500 mcg by mouth daily.        Follow-up Information     Lindell Spar, MD. Schedule an appointment as soon as possible for a visit in 1 week(s).   Specialty: Internal Medicine Contact information: [place] [address] [phone]                 Allergies  Allergen Reactions   Dicyclomine Other (See Comments)    Dry cough   Glucophage [Metformin] Diarrhea   Losartan Other (See Comments)    Heartburn     Consultations: None   Procedures/Studies: CT HEAD WO CONTRAST (5MM)  Result Date: 07/25/2022 CLINICAL DATA:  Altered mental status EXAM: CT HEAD WITHOUT CONTRAST TECHNIQUE: Contiguous axial images were obtained from the base of the skull through the vertex without intravenous contrast. RADIATION DOSE REDUCTION: This exam was performed according to the departmental dose-optimization program which includes automated exposure control, adjustment of the mA and/or kV according to patient size and/or use of iterative reconstruction technique. COMPARISON:  None Available. FINDINGS: Brain: No evidence of acute infarction, hemorrhage, hydrocephalus, extra-axial collection or mass lesion/mass effect. Mild atrophic changes and chronic white matter ischemic changes are seen. Vascular: No hyperdense vessel or unexpected calcification. Skull: Normal. Negative for fracture or focal lesion. Sinuses/Orbits: No acute finding. Other: None. IMPRESSION: Chronic atrophic and ischemic changes without acute  abnormality. Electronically Signed   By: Inez Catalina M.D.   On: 07/25/2022 00:29   CT Lumbar Spine Wo Contrast  Result Date: 07/23/2022 CLINICAL DATA:  Low back pain since 8/10 EXAM: CT LUMBAR SPINE WITHOUT CONTRAST TECHNIQUE: Multidetector CT imaging of the lumbar spine was performed without intravenous contrast administration. Multiplanar CT image reconstructions were also generated. RADIATION DOSE REDUCTION: This exam was performed according to the departmental dose-optimization program which includes automated exposure control, adjustment of the mA and/or kV according to patient size and/or use of iterative reconstruction technique. COMPARISON:  No prior CT of the lumbar spine. Correlation is made with 03/27/2022 CT abdomen pelvis FINDINGS:  Segmentation: 5 lumbar type vertebrae. Alignment: Trace retrolisthesis of L1 on L2 and L2 on L3. Straightening of the normal lumbar lordosis. Mild levocurvature. Vertebrae: Compression fracture of L2, with approximately 30% vertebral body height loss likely acute. 4 mm retropulsion of the posteroinferior cortex vertebral body heights are otherwise unchanged compared to April 2023. No suspicious osseous lesion. Paraspinal and other soft tissues: Aortic atherosclerosis. Disc levels: T12-L1: Seen only on the sagittal images. No significant disc bulge, spinal canal stenosis, or neural foraminal narrowing. L1-L2: Mild calcified disc bulge. Mild facet arthropathy. No spinal canal stenosis or neural foraminal narrowing. L2-L3: Moderate disc bulge. Mild facet arthropathy. Ligamentum flavum hypertrophy. Moderate spinal canal stenosis. No neural foraminal narrowing. L3-L4: Moderate disc bulge. Moderate facet arthropathy. Ligamentum flavum hypertrophy. Moderate spinal canal stenosis. No neural foraminal narrowing. L4-L5: Disc height loss with mild disc osteophyte complex. Moderate facet arthropathy. No spinal canal stenosis. Mild bilateral neural foraminal narrowing. L5-S1: No significant disc bulge. Moderate facet arthropathy. No spinal canal stenosis or neural foraminal narrowing. IMPRESSION: 1. Acute appearing compression fracture of L2 with approximately 30% vertebral body height loss and 4 mm retropulsion of the posteroinferior cortex. 2. 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 3. L4-L5 mild bilateral foraminal narrowing. These results were called by telephone at the time of interpretation on 07/23/2022 at 4:01 am to provider Veryl Speak , who verbally acknowledged these results. Electronically Signed   By: Merilyn Baba M.D.   On: 07/23/2022 04:03   MM 3D SCREEN BREAST BILATERAL  Result Date: 07/11/2022 CLINICAL DATA:  Screening. EXAM: DIGITAL SCREENING BILATERAL MAMMOGRAM WITH TOMOSYNTHESIS AND CAD TECHNIQUE:  Bilateral screening digital craniocaudal and mediolateral oblique mammograms were obtained. Bilateral screening digital breast tomosynthesis was performed. The images were evaluated with computer-aided detection. COMPARISON:  Previous exam(s). ACR Breast Density Category b: There are scattered areas of fibroglandular density. FINDINGS: In the right breast , calcifications requires further evaluation. In the left breast , a possible mass requires further evaluation. This possible mass is seen within the retroareolar LEFT breast, cc views only, cc 1/2 slice 42 and cc 2/2 slice 43. IMPRESSION: Further evaluation is suggested for calcifications in the right breast. Further evaluation is suggested for possible mass in the left breast. RECOMMENDATION: Diagnostic mammogram and possibly ultrasound of both breasts. (Code:[id number]) The patient will be contacted regarding the findings, and additional imaging will be scheduled. BI-RADS CATEGORY  0: Incomplete. Need additional imaging evaluation and/or prior mammograms for comparison. Electronically Signed   By: Franki Cabot M.D.   On: 07/11/2022 15:33     Discharge Exam: Vitals:   07/27/22 2053 07/28/22 0555  BP: (!) 133/43 (!) 123/42  Pulse: 61 (!) 56  Resp: 20 20  Temp: 97.8 F (36.6 C) 98.1 F (36.7 C)  SpO2: 100% 99%   Vitals:   07/27/22 0832 07/27/22 1400  07/27/22 2053 07/28/22 0555  BP: (!) 116/44 (!) 117/45 (!) 133/43 (!) 123/42  Pulse:  81 61 (!) 56  Resp:  20 20 20   Temp:  97.8 F (36.6 C) 97.8 F (36.6 C) 98.1 F (36.7 C)  TempSrc:  Oral Oral Oral  SpO2:  98% 100% 99%  Weight:      Height:        General: Pt is alert, awake, not in acute distress Cardiovascular: RRR, S1/S2 +, no rubs, no gallops Respiratory: CTA bilaterally, no wheezing, no rhonchi Abdominal: Soft, NT, ND, bowel sounds + Extremities: no edema, no cyanosis    The results of significant diagnostics from this hospitalization (including imaging, microbiology,  ancillary and laboratory) are listed below for reference.     Microbiology: Recent Results (from the past 240 hour(s))  Urine Culture     Status: Abnormal   Collection Time: 07/25/22  1:07 AM   Specimen: Urine, Clean Catch  Result Value Ref Range Status   Specimen Description   Final    URINE, CLEAN CATCH Performed at [facility], [address] [place], [place] [address]    Special Requests   Final    NONE Performed at [facility], [address] [place],  [address]    Culture >=100,000 COLONIES/mL KLEBSIELLA PNEUMONIAE (A)  Final   Report Status 07/27/2022 FINAL  Final   Organism ID, Bacteria KLEBSIELLA PNEUMONIAE (A)  Final      Susceptibility   Klebsiella pneumoniae - MIC*    AMPICILLIN RESISTANT Resistant     CEFAZOLIN <=4 SENSITIVE Sensitive     CEFEPIME <=0.12 SENSITIVE Sensitive     CEFTRIAXONE <=0.25 SENSITIVE Sensitive     CIPROFLOXACIN <=0.25 SENSITIVE Sensitive     GENTAMICIN <=1 SENSITIVE Sensitive     IMIPENEM <=0.25 SENSITIVE Sensitive     NITROFURANTOIN 64 INTERMEDIATE Intermediate     TRIMETH/SULFA <=20 SENSITIVE Sensitive     AMPICILLIN/SULBACTAM <=2 SENSITIVE Sensitive     PIP/TAZO <=4 SENSITIVE Sensitive     * >=100,000 COLONIES/mL KLEBSIELLA PNEUMONIAE     Labs: BNP (last 3 results) No results for input(s): "BNP" in the last 8760 hours. Basic Metabolic Panel: Recent Labs  Lab 07/24/22 2242 07/25/22 0704 07/26/22 0508 07/26/22 1702 07/28/22 0601  NA 143 142 141 137 137  K 3.9 3.8 3.5 3.8 3.7  CL 109 110 108 106 109  CO2 23 23 26 25  20*  GLUCOSE 88 82 90 85 139*  BUN 46* 39* 27* 24* 17  CREATININE 1.12* 0.97 0.92 0.98 0.92  CALCIUM 9.5 8.5* 8.6* 8.2* 8.0*  MG  --  2.2 1.9  --   --    Liver Function Tests: Recent Labs  Lab 07/25/22 0704 07/26/22 0508  AST 43* 26  ALT 55* 40  ALKPHOS 114 101  BILITOT 0.8 0.6  PROT 6.0* 5.3*  ALBUMIN 3.0* 2.7*   No results for input(s): "LIPASE", "AMYLASE" in the last 168 hours. No results  for input(s): "AMMONIA" in the last 168 hours. CBC: Recent Labs  Lab 07/23/22 0345 07/24/22 2242 07/25/22 0704 07/26/22 0508 07/27/22 0407  WBC 10.3 8.2 6.1 9.9 6.7  NEUTROABS 8.3* 6.3 4.8  --   --   HGB 11.0* 10.4* 9.3* 9.5* 8.5*  HCT 32.3* 31.3* 28.8* 28.9* 27.2*  MCV 91.5 92.3 94.1 92.3 96.1  PLT 154 127* 100* 149* 125*   Cardiac Enzymes: No results for input(s): "CKTOTAL", "CKMB", "CKMBINDEX", "TROPONINI" in the last 168 hours. BNP: Invalid input(s): "POCBNP"  CBG: No results for input(s): "GLUCAP" in the last 168 hours. D-Dimer No results for input(s): "DDIMER" in the last 72 hours. Hgb A1c No results for input(s): "HGBA1C" in the last 72 hours. Lipid Profile No results for input(s): "CHOL", "HDL", "LDLCALC", "TRIG", "CHOLHDL", "LDLDIRECT" in the last 72 hours. Thyroid function studies No results for input(s): "TSH", "T4TOTAL", "T3FREE", "THYROIDAB" in the last 72 hours.  Invalid input(s): "FREET3" Anemia work up No results for input(s): "VITAMINB12", "FOLATE", "FERRITIN", "TIBC", "IRON", "RETICCTPCT" in the last 72 hours. Urinalysis    Component Value Date/Time   COLORURINE YELLOW 07/25/2022 0107   APPEARANCEUR HAZY (A) 07/25/2022 0107   APPEARANCEUR Clear 06/16/2022 1340   LABSPEC 1.016 07/25/2022 0107   PHURINE 5.0 07/25/2022 0107   GLUCOSEU NEGATIVE 07/25/2022 0107   HGBUR NEGATIVE 07/25/2022 0107   BILIRUBINUR NEGATIVE 07/25/2022 0107   BILIRUBINUR Negative 06/16/2022 1340   KETONESUR NEGATIVE 07/25/2022 0107   PROTEINUR NEGATIVE 07/25/2022 0107   UROBILINOGEN 0.2 08/13/2021 0853   NITRITE POSITIVE (A) 07/25/2022 0107   LEUKOCYTESUR MODERATE (A) 07/25/2022 0107   Sepsis Labs Recent Labs  Lab 07/24/22 2242 07/25/22 0704 07/26/22 0508 07/27/22 0407  WBC 8.2 6.1 9.9 6.7   Microbiology Recent Results (from the past 240 hour(s))  Urine Culture     Status: Abnormal   Collection Time: 07/25/22  1:07 AM   Specimen: Urine, Clean Catch  Result Value  Ref Range Status   Specimen Description   Final    URINE, CLEAN CATCH Performed at [facility], [address], [place], [place] [address]    Special Requests   Final    NONE Performed at [facility], [address] [place], [place] [address]    Culture >=100,000 COLONIES/mL KLEBSIELLA PNEUMONIAE (A)  Final   Report Status 07/27/2022 FINAL  Final   Organism ID, Bacteria KLEBSIELLA PNEUMONIAE (A)  Final      Susceptibility   Klebsiella pneumoniae - MIC*    AMPICILLIN RESISTANT Resistant     CEFAZOLIN <=4 SENSITIVE Sensitive     CEFEPIME <=0.12 SENSITIVE Sensitive     CEFTRIAXONE <=0.25 SENSITIVE Sensitive     CIPROFLOXACIN <=0.25 SENSITIVE Sensitive     GENTAMICIN <=1 SENSITIVE Sensitive     IMIPENEM <=0.25 SENSITIVE Sensitive     NITROFURANTOIN 64 INTERMEDIATE Intermediate     TRIMETH/SULFA <=20 SENSITIVE Sensitive     AMPICILLIN/SULBACTAM <=2 SENSITIVE Sensitive     PIP/TAZO <=4 SENSITIVE Sensitive     * >=100,000 COLONIES/mL KLEBSIELLA PNEUMONIAE     Time coordinating discharge: 35 minutes  SIGNED:   Rodena Goldmann, DO [facility] 07/28/2022, 12:21 PM  If 7PM-7AM, please contact night-coverage [email or web address]

## 2022-07-28 NOTE — NC FL2 (Signed)
[place] LEVEL OF CARE SCREENING TOOL     IDENTIFICATION  Patient Name: Tasha Roberts Birthdate: 1941-04-17 Sex: female Admission Date (Current Location): 07/24/2022  [facility] and [place] Number:  [employer] and Address:  [place] [address], [place]      Provider Number: [id number]  Attending Physician Name and Address:  Rodena Goldmann, DO  Relative Name and Phone Number:  Duaine Dredge (Daughter)   [phone]    Current Level of Care: Hospital Recommended Level of Care: [place] Prior Approval Number:    Date Approved/Denied:   PASRR Number: [id number]  Discharge Plan: SNF    Current Diagnoses: Patient Active Problem List   Diagnosis Date Noted   Chronic anemia 78/93/8101   Acute metabolic encephalopathy 75/09/2584   UTI (urinary tract infection) 07/25/2022   Compression fracture of L2 ([place]) 07/25/2022   Hospital discharge follow-up 04/07/2022   Abdominal cramping 04/07/2022   Left ureteral stone 03/28/2022   Nausea & vomiting 03/28/2022   Sepsis secondary to UTI ([place]) 03/27/2022   Serrated adenoma of colon 01/09/2022   Depression, major, single episode, moderate ([place]) 12/05/2021   Gastroesophageal reflux disease 12/05/2021   Physical deconditioning 12/05/2021   Mixed hyperlipidemia 12/05/2021   Essential hypertension 07/31/2021   Age related osteoporosis 07/31/2021   Acquired hypothyroidism 07/31/2021   Chronic diarrhea 07/31/2021   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   Iron deficiency anemia due to chronic blood loss 03/04/2021   Thrombocytopenia ([place]) 03/04/2021   Acquired thrombophilia ([place]) 02/21/2021   CAD (coronary artery disease)    Lichen planus 27/78/2423   Bronchiectasis with (acute) exacerbation (HCC) 10/02/2020   AF (paroxysmal atrial fibrillation) ([place]) 10/02/2020   Liver cirrhosis secondary to NASH ([place]) 10/02/2020   Graves disease 12/22/2014    Obesity 53/61/4431   Nonalcoholic steatohepatitis ([facility]) 10/12/2012   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   Vitamin D deficiency 04/13/2007   Angina pectoris ([place]) 08/27/2005   DM II (diabetes mellitus, type II), controlled ([place]) 08/27/2005   Sarcoidosis 1965    Orientation RESPIRATION BLADDER Height & Weight     Self, Time, Place, Situation  Normal External catheter Weight: 59.7 kg Height:  5' 3"  (160 cm)  BEHAVIORAL SYMPTOMS/MOOD NEUROLOGICAL BOWEL NUTRITION STATUS      Continent Diet (See DC summary)  AMBULATORY STATUS COMMUNICATION OF NEEDS Skin   Extensive Assist Verbally Normal                       Personal Care Assistance Level of Assistance  Bathing, Feeding, Dressing Bathing Assistance: Maximum assistance Feeding assistance: Limited assistance Dressing Assistance: Maximum assistance     Functional Limitations Info  Sight, Hearing, Speech Sight Info: Adequate Hearing Info: Adequate Speech Info: Adequate    SPECIAL CARE FACTORS FREQUENCY  PT (By licensed PT)     PT Frequency: 5 times a week              Contractures Contractures Info: Not present    Additional Factors Info  Code Status, Allergies Code Status Info: FULL Allergies Info: Dicyclomine,Glucophage, Losartan           Current Medications (07/28/2022):  This is the current hospital active medication list Current Facility-Administered Medications  Medication Dose Route Frequency Provider Last Rate Last Admin   acetaminophen (TYLENOL) tablet 650 mg  650 mg Oral Q6H Arrien, Jimmy Picket, MD   650 mg at 07/28/22 [phone]  apixaban (ELIQUIS) tablet 5 mg  5 mg Oral BID Zierle-Ghosh, Asia B, DO   5 mg at 07/28/22 0902   atorvastatin (LIPITOR) tablet 20 mg  20 mg Oral Daily Zierle-Ghosh, Asia B, DO   20 mg at 07/28/22 1747   celecoxib (CELEBREX) capsule 200 mg  200 mg Oral BID Arrien, Jimmy Picket, MD       cyclobenzaprine (FLEXERIL) tablet 5 mg  5 mg Oral TID Tawni Millers, MD   5 mg at 07/28/22 0902   diltiazem (CARDIZEM CD) 24 hr capsule 120 mg  120 mg Oral Daily Zierle-Ghosh, Asia B, DO   120 mg at 07/28/22 0902   famotidine (PEPCID) tablet 20 mg  20 mg Oral BID PRN Zierle-Ghosh, Asia B, DO       feeding supplement (ENSURE ENLIVE / ENSURE PLUS) liquid 237 mL  237 mL Oral BID BM Arrien, Jimmy Picket, MD   237 mL at 07/28/22 0905   fesoterodine (TOVIAZ) tablet 4 mg  4 mg Oral Daily Zierle-Ghosh, Asia B, DO   4 mg at 07/28/22 1595   levothyroxine (SYNTHROID) tablet 100 mcg  100 mcg Oral Q0600 Zierle-Ghosh, Asia B, DO   100 mcg at 07/28/22 0512   lidocaine (LIDODERM) 5 % 1 patch  1 patch Transdermal Q24H Zierle-Ghosh, Asia B, DO   1 patch at 07/28/22 0513   mirtazapine (REMERON) tablet 7.5 mg  7.5 mg Oral QHS Zierle-Ghosh, Asia B, DO   7.5 mg at 07/27/22 2248   ondansetron (ZOFRAN) tablet 4 mg  4 mg Oral Q6H PRN Zierle-Ghosh, Asia B, DO       Or   ondansetron (ZOFRAN) injection 4 mg  4 mg Intravenous Q6H PRN Zierle-Ghosh, Asia B, DO       oxyCODONE (Oxy IR/ROXICODONE) immediate release tablet 5 mg  5 mg Oral Q4H PRN Zierle-Ghosh, Asia B, DO   5 mg at 07/26/22 1226     Discharge Medications: Please see discharge summary for a list of discharge medications.  Relevant Imaging Results:  Relevant Lab Results:   Additional Information SS# [id number]  Boneta Lucks, RN

## 2022-07-28 NOTE — TOC Transition Note (Signed)
Transition of Care [facility]) - CM/SW Discharge Note   Patient Details  Name: Tasha Roberts MRN: [id number] Date of Birth: 07-04-1941  [facility] CM/SW Contact:  Boneta Lucks, RN Phone Number: 07/28/2022, 3:38 PM   Clinical Narrative:   Patient and daughter request SNF for short term this Tasha. TOC sent out and got bed offer from [facility] rehab. [name] is agreeable. RN will call report. Med necessity printed. TOC will call EMS when RN is ready.    Final next level of care: Skilled Nursing Facility Barriers to Discharge: Barriers Resolved   Patient Goals and CMS Choice Patient states their goals for this hospitalization and ongoing recovery are:: wants to go to SNF. CMS Medicare.gov Compare Post Acute Care list provided to:: Patient Choice offered to / list presented to : Patient  Discharge Placement             [facility]   Patient to be transferred to facility by: EMS Name of family member notified: Debbie Patient and family notified of of transfer: 07/28/22  Discharge Plan and Services      Readmission Risk Interventions    07/25/2022    3:44 PM  Readmission Risk Prevention Plan  Transportation Screening Complete  HRI or Agenda Complete  Social Work Consult for [place] Planning/Counseling Complete  Palliative Care Screening Complete  Medication Review Press photographer) Complete

## 2022-08-01 ENCOUNTER — Inpatient Hospital Stay: Payer: Medicare Other

## 2022-08-04 ENCOUNTER — Ambulatory Visit: Payer: Medicare Other

## 2022-08-06 ENCOUNTER — Telehealth: Payer: Self-pay | Admitting: Internal Medicine

## 2022-08-06 ENCOUNTER — Encounter (HOSPITAL_COMMUNITY): Payer: Self-pay

## 2022-08-06 ENCOUNTER — Telehealth: Payer: Self-pay | Admitting: *Deleted

## 2022-08-06 ENCOUNTER — Telehealth: Payer: Self-pay

## 2022-08-06 ENCOUNTER — Ambulatory Visit (HOSPITAL_COMMUNITY): Payer: Medicare Other | Attending: Neurosurgery

## 2022-08-06 DIAGNOSIS — S12500D Unspecified displaced fracture of sixth cervical vertebra, subsequent encounter for fracture with routine healing: Secondary | ICD-10-CM | POA: Insufficient documentation

## 2022-08-06 NOTE — Telephone Encounter (Signed)
Called in on patient behalf.  Needs order for hospital bed. States she spoke with provider during her visit (daughter)   Patient will be coming home 8/31 or 9/1   Wants a  call back

## 2022-08-06 NOTE — Telephone Encounter (Signed)
Spoke with the patient's daughter.  Tasha Roberts has recently been diagnosed with dementia, had a fall, hit her head, has discontinued Eliquis, and has several fractures in her back. She is currently hospitalized in [facility].  After much discussion, it was agreed to cancel [place] scheduled 9/14. She understands I will call in several weeks to see how Tasha Roberts is doing. If at that time they wish to proceed, will schedule appointment with Dr. Burt Knack prior to procedure. At this time, the patient's daughter wishes to hold a spot on the November schedule - procedure postponed.  She was grateful for call and agrees with plan.

## 2022-08-06 NOTE — Telephone Encounter (Signed)
Spoke with daughter, [name] regarding patient's current status, as we wanted to follow up on missed Iron infusions and mammogram.  She advised that she fell while at the [facility] on Monday and is now in house at [facility].  She is receiving blood and possible iron, per [name].  As for the mammogram, [name] will keep it in mind to schedule when patient is able to complete.

## 2022-08-06 NOTE — Telephone Encounter (Signed)
Spoke with Debbie Appt made for [facility] will call back when patient is discharged

## 2022-08-08 ENCOUNTER — Inpatient Hospital Stay: Payer: Medicare Other

## 2022-08-11 ENCOUNTER — Encounter (HOSPITAL_COMMUNITY): Payer: Self-pay

## 2022-08-11 ENCOUNTER — Emergency Department (HOSPITAL_COMMUNITY)
Admission: EM | Admit: 2022-08-11 | Discharge: 2022-08-11 | Disposition: A | Discharge status: Home / Self Care | Payer: Medicare Other | Attending: Emergency Medicine | Admitting: Emergency Medicine

## 2022-08-11 ENCOUNTER — Other Ambulatory Visit: Payer: Self-pay

## 2022-08-11 DIAGNOSIS — K5903 Drug induced constipation: Secondary | ICD-10-CM

## 2022-08-11 DIAGNOSIS — K644 Residual hemorrhoidal skin tags: Secondary | ICD-10-CM | POA: Diagnosis not present

## 2022-08-11 DIAGNOSIS — Z7901 Long term (current) use of anticoagulants: Secondary | ICD-10-CM | POA: Diagnosis not present

## 2022-08-11 DIAGNOSIS — K5900 Constipation, unspecified: Secondary | ICD-10-CM | POA: Diagnosis present

## 2022-08-11 DIAGNOSIS — T398X5A Adverse effect of other nonopioid analgesics and antipyretics, not elsewhere classified, initial encounter: Secondary | ICD-10-CM | POA: Insufficient documentation

## 2022-08-11 MED ORDER — DOCUSATE SODIUM 100 MG PO CAPS: 100.0000 mg | ORAL_CAPSULE | Freq: Two times a day (BID) | ORAL | 0 refills | Status: DC

## 2022-08-11 MED ORDER — POLYETHYLENE GLYCOL 3350 17 G PO PACK: 17.0000 g | PACK | Freq: Every day | ORAL | 0 refills | Status: DC | PRN

## 2022-08-11 NOTE — ED Triage Notes (Signed)
Pt arrived from home w c/o unable to have a bm since Friday d/t pain caused by a hemorrhoid.

## 2022-08-11 NOTE — ED Provider Notes (Signed)
[facility] EMERGENCY DEPARTMENT Provider Note   CSN: [id number] Arrival date & time: 08/11/22  3329     History  Chief Complaint  Patient presents with   Hemorrhoids    Tasha Roberts is a 81 y.o. female.  Patient presents to the emergency department for evaluation of constipation and a hemorrhoid.  Patient was recently hospitalized after a fall.  She was discharged from the hospital last week.  Since coming home from the hospital, she has not been able to have a bowel movement.  She has the urge to defecate, feels pressure in her rectum but cannot get anything out.  Daughter reports that she has "something poking out" that she assumes is a hemorrhoid.       Home Medications Prior to Admission medications   Medication Sig Start Date End Date Taking? Authorizing Provider  docusate sodium (COLACE) 100 MG capsule Take 1 capsule (100 mg total) by mouth every 12 (twelve) hours. 08/11/22  Yes Aditya Nastasi, Gwenyth Allegra, MD  polyethylene glycol (MIRALAX / GLYCOLAX) 17 g packet Take 17 g by mouth daily as needed for moderate constipation. 08/11/22  Yes Abeeha Twist, Gwenyth Allegra, MD  apixaban (ELIQUIS) 5 MG TABS tablet TAKE (1) TABLET BY MOUTH TWICE DAILY. Patient taking differently: Take 5 mg by mouth 2 (two) times daily. 09/19/21   Arnoldo Lenis, MD  atorvastatin (LIPITOR) 20 MG tablet TAKE (1) TABLET BY MOUTH ONCE DAILY. Patient taking differently: Take 20 mg by mouth daily. 03/05/22   Lindell Spar, MD  celecoxib (CELEBREX) 200 MG capsule Take 1 capsule (200 mg total) by mouth 2 (two) times daily. 07/28/22 08/27/22  Heath Lark D, DO  Cholecalciferol 50 MCG (2000 UT) CAPS Take 1 capsule (2,000 Units total) by mouth daily. 03/30/22   Antonieta Pert, MD  clobetasol ointment (TEMOVATE) 0.05 % Apply topically at bedtime. Patient not taking: Reported on 07/25/2022 01/08/22   [provider]  cyclobenzaprine (FLEXERIL) 10 MG tablet Take 1 tablet (10 mg total) by mouth 2 (two) times daily as needed  for muscle spasms. 07/12/22   Myna Bright M, PA-C  cyclobenzaprine (FLEXERIL) 5 MG tablet Take 1 tablet (5 mg total) by mouth 3 (three) times daily. 07/28/22   Manuella Ghazi, Pratik D, DO  diltiazem (CARDIZEM CD) 120 MG 24 hr capsule TAKE (1) CAPSULE BY MOUTH ONCE DAILY. 05/16/22   Arnoldo Lenis, MD  famotidine (PEPCID) 20 MG tablet Take 1 tablet (20 mg total) by mouth 2 (two) times daily as needed for heartburn or indigestion. 12/05/21   Lindell Spar, MD  feeding supplement (ENSURE ENLIVE / ENSURE PLUS) LIQD Take 237 mLs by mouth 2 (two) times daily between meals. 07/28/22   Manuella Ghazi, Pratik D, DO  hyoscyamine (LEVSIN) 0.125 MG tablet Take 1 tablet (0.125 mg total) by mouth every 6 (six) hours as needed for cramping. Patient not taking: Reported on 07/25/2022 04/07/22   Lindell Spar, MD  levothyroxine (SYNTHROID) 100 MCG tablet Take 1 tablet (100 mcg total) by mouth daily before breakfast. 06/09/22   Lindell Spar, MD  lidocaine (LIDODERM) 5 % Place 1 patch onto the skin daily. Remove & Discard patch within 12 hours or as directed by MD 07/12/22   Hendricks Limes, PA-C  mirabegron ER (MYRBETRIQ) 25 MG TB24 tablet Take 1 tablet (25 mg total) by mouth daily. 04/16/22   McKenzie, Candee Furbish, MD  mirtazapine (REMERON) 7.5 MG tablet Take 1 tablet (7.5 mg total) by mouth at bedtime. Patient not taking: Reported  on 07/25/2022 12/05/21   Lindell Spar, MD  nitroGLYCERIN (NITROSTAT) 0.4 MG SL tablet Place 1 tablet (0.4 mg total) under the tongue every 5 (five) minutes as needed for chest pain. 07/31/21   Lindell Spar, MD  oxyCODONE (OXY IR/ROXICODONE) 5 MG immediate release tablet Take 1 tablet (5 mg total) by mouth every 4 (four) hours as needed for severe pain. 07/28/22   Manuella Ghazi, Pratik D, DO  solifenacin (VESICARE) 5 MG tablet Take 1 tablet (5 mg total) by mouth daily. 05/16/22   McKenzie, Candee Furbish, MD  tolterodine (DETROL LA) 4 MG 24 hr capsule Take 1 capsule (4 mg total) by mouth daily. 06/16/22   McKenzie, Candee Furbish, MD  ursodiol (ACTIGALL) 250 MG tablet Take 250 mg by mouth 2 (two) times daily.    [provider]  vitamin B-12 (CYANOCOBALAMIN) 500 MCG tablet Take 500 mcg by mouth daily.    [provider]      Allergies    Dicyclomine, Glucophage [metformin], and Losartan    Review of Systems   Review of Systems  Physical Exam Updated Vital Signs BP (!) 114/52   Pulse 85   Temp 98.1 F (36.7 C) (Oral)   Resp 20   SpO2 92%  Physical Exam Vitals and nursing note reviewed.  Constitutional:      General: She is not in acute distress.    Appearance: She is well-developed.  HENT:     Head: Normocephalic and atraumatic.     Mouth/Throat:     Mouth: Mucous membranes are moist.  Eyes:     General: Vision grossly intact. Gaze aligned appropriately.     Extraocular Movements: Extraocular movements intact.     Conjunctiva/sclera: Conjunctivae normal.  Cardiovascular:     Rate and Rhythm: Normal rate and regular rhythm.     Pulses: Normal pulses.     Heart sounds: Normal heart sounds, S1 normal and S2 normal. No murmur heard.    No friction rub. No gallop.  Pulmonary:     Effort: Pulmonary effort is normal. No respiratory distress.     Breath sounds: Normal breath sounds.  Abdominal:     General: Bowel sounds are normal.     Palpations: Abdomen is soft.     Tenderness: There is no abdominal tenderness. There is no guarding or rebound.     Hernia: No hernia is present.  Genitourinary:    Comments: Multiple nonthrombosed, noninflamed external hemorrhoids noted.  Large amount of soft stool in rectum Musculoskeletal:        General: No swelling.     Cervical back: Full passive range of motion without pain, normal range of motion and neck supple. No spinous process tenderness or muscular tenderness. Normal range of motion.     Right lower leg: No edema.     Left lower leg: No edema.  Skin:    General: Skin is warm and dry.     Capillary Refill: Capillary refill takes less  than 2 seconds.     Findings: No ecchymosis, erythema, rash or wound.  Neurological:     General: No focal deficit present.     Mental Status: She is alert and oriented to person, place, and time.     GCS: GCS eye subscore is 4. GCS verbal subscore is 5. GCS motor subscore is 6.     Cranial Nerves: Cranial nerves 2-12 are intact.     Sensory: Sensation is intact.     Motor: Motor function  is intact.     Coordination: Coordination is intact.  Psychiatric:        Attention and Perception: Attention normal.        Mood and Affect: Mood normal.        Speech: Speech normal.        Behavior: Behavior normal.     ED Results / Procedures / Treatments   Labs (all labs ordered are listed, but only abnormal results are displayed) Labs Reviewed - No data to display  EKG None  Radiology No results found.  Procedures Procedures    Medications Ordered in ED Medications - No data to display  ED Course/ Medical Decision Making/ A&P                           Medical Decision Making  Presents to the emergency department because of constipation.  Patient is on analgesia after a fall resulting in compression fractures in her spine.  She has not had a bowel movement for several days.  Rectal examination reveals large stool ball in the rectum that was manually disimpacted by myself.  She was then ministered a soapsuds enema.  Patient feels improvement.  Abdominal exam is benign, nontender.  She appears well otherwise.  Will place on MiraLAX, follow-up with PCP.  Given return precautions.        Final Clinical Impression(s) / ED Diagnoses Final diagnoses:  Drug-induced constipation    Rx / DC Orders ED Discharge Orders          Ordered    docusate sodium (COLACE) 100 MG capsule  Every 12 hours        08/11/22 0500    polyethylene glycol (MIRALAX / GLYCOLAX) 17 g packet  Daily PRN        08/11/22 0500              Orpah Greek, MD 08/11/22 0500

## 2022-08-12 ENCOUNTER — Ambulatory Visit (INDEPENDENT_AMBULATORY_CARE_PROVIDER_SITE_OTHER): Payer: Medicare Other | Admitting: Internal Medicine

## 2022-08-12 ENCOUNTER — Encounter: Payer: Self-pay | Admitting: Internal Medicine

## 2022-08-12 ENCOUNTER — Telehealth: Payer: Self-pay | Admitting: *Deleted

## 2022-08-12 DIAGNOSIS — Z09 Encounter for follow-up examination after completed treatment for conditions other than malignant neoplasm: Secondary | ICD-10-CM

## 2022-08-12 DIAGNOSIS — L98421 Non-pressure chronic ulcer of back limited to breakdown of skin: Secondary | ICD-10-CM

## 2022-08-12 DIAGNOSIS — S32020D Wedge compression fracture of second lumbar vertebra, subsequent encounter for fracture with routine healing: Secondary | ICD-10-CM

## 2022-08-12 DIAGNOSIS — S12590D Other displaced fracture of sixth cervical vertebra, subsequent encounter for fracture with routine healing: Secondary | ICD-10-CM | POA: Diagnosis not present

## 2022-08-12 DIAGNOSIS — G9341 Metabolic encephalopathy: Secondary | ICD-10-CM

## 2022-08-12 DIAGNOSIS — R110 Nausea: Secondary | ICD-10-CM

## 2022-08-12 MED ORDER — ONDANSETRON HCL 4 MG PO TABS: 4.0000 mg | ORAL_TABLET | Freq: Three times a day (TID) | ORAL | 0 refills | Status: DC | PRN

## 2022-08-12 NOTE — Assessment & Plan Note (Signed)
Hospital chart reviewed, including discharge summary Had fall due to AMS (?)/dizziness - has cervical and lumbar fractures, has cervical collar and back brace Sepsis and acute metabolic encephalopathy secondary to UTI -resolved now Planned to follow-up with urology in outpatient setting

## 2022-08-12 NOTE — Assessment & Plan Note (Signed)
Likely due to UTI - now resolved

## 2022-08-12 NOTE — Progress Notes (Signed)
Virtual Visit via Video Note   Because of Tasha Roberts's co-morbid illnesses, she is at least at moderate risk for complications without adequate follow up.  This format is felt to be most appropriate for this patient at this time.  All issues noted in this document were discussed and addressed.  A limited physical exam was performed with this format.    Evaluation Performed:  Follow-up visit  Date:  08/12/2022   ID:  Tasha Roberts, DOB 1941/06/30, MRN [id number]  Patient Location: Home Provider Location: Office/Clinic  Participants: Patient and daughter Location of Patient: Home Location of Provider: Telehealth Consent was obtain for visit to be over via telehealth. I verified that I am speaking with the correct person using two identifiers.  PCP:  Lindell Spar, MD   Chief Complaint: Hospital discharge follow-up  History of Present Illness:    Tasha Roberts is a 81 y.o. female with PMH of HTN, paroxysmal A Fib, NASH, hypothyroidism, breast ca s/p left mastectomy, iron deficiency anemia, thrombocytopenia, osteoporosis and chronic loose BM who has a video visit for re mentationcent hospitalization from 08/28-09/01 for a fall at nursing facility.  She was admitted after falling at [facility] and rehab. She was found to have altered mental status which daughter states has been present for the last couple of weeks as well as fractures of the C spine. Orthopedic trauma surgeon at [facility] was consulted and recommended c-collar, discontinuation of Eliquis, and outpatient referral. She was admitted for treatment and management.  Fall: Accidental fall, compression fracture of L2, scalp hematoma, C6 and C7 fractures; c-collar 24 hours a day. She was given TLSO brace when sitting or ambulating. MRI ruled out epidural hematoma. She had head injury - laceration - staples were placed. No discharge reported.  UTI: She was admitted with acute metabolic encephalopathy due to UTI from 08/17-08/21 and  was later discharged to [facility] and rehab, where she had fall.  Daughter reports that her mother's mental status has improved since getting home. Denies any delusion or hallucination. Denies dysuria, hematuria, fever, chills or vomiting. She c/o nausea, but also reports that she has not been eating well, but slowly improving her eating habits.     The patient does not have symptoms concerning for COVID-19 infection (fever, chills, cough, or new shortness of breath).   Past Medical, Surgical, Social History, Allergies, and Medications have been Reviewed.  Past Medical History:  Diagnosis Date   Anemia due to blood loss    iron infusion's last one 03-25-2021 (pt bleeding post vulvar bx 02-11-2021, hospiatal admission 02-21-2021)   Anticoagulant long-term use    eliquis--- managed by pcp    Arthritis    hands   Bronchiectasis [facility])    pulmonology --- dr Halford Chessman   (04-01-2021  pt stated no supplemental oxygen use since 01/ 2022 and no inhaler use,  stated checks O2 at home during the day 98-99%)   Cancer ([place]) 2013   Lumpectomy left breast   Cataract 2015   Chronic constipation    Clotting disorder ([place])    On eliquis   Coronary artery calcification    per lexiscan 08-17-2019 result in care everywhere   Depression, major, single episode, moderate ([place]) 12/05/2021   DM II (diabetes mellitus, type II), controlled ([place]) 08/27/2005   DOE (dyspnea on exertion)    04-01-2021  per pt only going up stairs    Gastroesophageal reflux disease 12/05/2021   History of diabetes mellitus, type II  04-01-2021  per pt no issues after weight loss, last taken medication approx 2017   History of Graves' disease 2017   s/p RAI   History of left breast cancer    dx 2013,  s/p left partial mastecotmy w/ node dissection 09-15-2012, low grade DCIS, no chemo, completed radiation 02/ 2014,  per pt no recurrence   History of respiratory syncytial virus (RSV) infection 09/2020   w/ hypoxia  , hospital  admission 10/ 2021  and acute exacerbation bronchiectasis;  follow up post hospital pulmonogy dr Halford Chessman  note in epic   Hyperlipidemia    Hypertension    followed by pcp   (pt had lexiscan in care everywhere dated 08-17-2019 no ischemia , non-obstructive extensive calcification, ef 69%)   Hypothyroidism    Hypothyroidism, postradioiodine therapy    followed by pcp   Liver cirrhosis secondary to NASH (nonalcoholic steatohepatitis) ([place])    followed by dr Merrilee Jansky ([facility])---- dx 2004,  compensated ,  last liver bx (03/ 2015) fibrosis stage 3;  moderate portal hypertensive gastrophy   PAF (paroxysmal atrial fibrillation) ([place]) previous cardiologist lov note w/ Suann Larry PA ([facility]) dated 01-24-2020 in care everywhere;  (04-01-2021 per pt has appt w/ new cardiologist , dr j. branch 04-30-2021)   first dx 2020--  had event monitor/ stress test/ echo all results in care everywhere (monitor 08-03-2019 short runs AFib conversion pause <2 secconds, rate controlled;  echo 12-14-2019 ef 60%, mild concentric LVH, G2DD, RSVP 50.66mHg)   Personal history of radiation therapy 2013   left breast cancer,  completed 02/ 2014   PONV (postoperative nausea and vomiting)    Sarcoidosis, lung ([facility] pulmonology--- dr sHalford Chessman  04-01-2021  per pt dx age [age]s, no issue since   Thrombocytopenia ([facility]    hematology/ oncology--- dr s. kDelton Coombes  VIN III (vulvar intraepithelial neoplasia III)    Past Surgical History:  Procedure Laterality Date   [place]  BREAST SURGERY  09/15/2012   CATARACT EXTRACTION W/ INTRAOCULAR LENS  IMPLANT, BILATERAL  2016   CHOLECYSTECTOMY OPEN  14665  CO2 LASER APPLICATION N/A [id number]  Procedure: CO2 LASER APPLICATION OF THE VULVA;  Surgeon: REveritt Amber MD;  [facility]: [facility]  Service: Gynecology;  Laterality: N/A;   COLONOSCOPY WITH  ESOPHAGOGASTRODUODENOSCOPY (EGD)  last one 08/ 2018  @[facility]    EYE SURGERY  2015   Cataract removed both eyes   LUMBAR LAMINECTOMY  2009   PARTIAL MASTECTOMY WITH AXILLARY SENTINEL LYMPH NODE BIOPSY [place]  TUBAL LIGATION  08/08/1969     Current Meds  Medication Sig   atorvastatin (LIPITOR) 20 MG tablet TAKE (1) TABLET BY MOUTH ONCE DAILY. (Patient taking differently: Take 20 mg by mouth daily.)   Cholecalciferol 50 MCG (2000 UT) CAPS Take 1 capsule (2,000 Units total) by mouth daily.   clobetasol ointment (TEMOVATE) 0.05 % Apply topically at bedtime.   cyclobenzaprine (FLEXERIL) 10 MG tablet Take 1 tablet (10 mg total) by mouth 2 (two) times daily as needed for muscle spasms.   diltiazem (CARDIZEM CD) 120 MG 24 hr capsule TAKE (1) CAPSULE BY MOUTH ONCE DAILY.   docusate sodium (COLACE) 100 MG capsule Take 1 capsule (100 mg total) by mouth every 12 (twelve) hours.   feeding supplement (ENSURE ENLIVE / ENSURE PLUS) LIQD Take 237 mLs  by mouth 2 (two) times daily between meals.   levothyroxine (SYNTHROID) 100 MCG tablet Take 1 tablet (100 mcg total) by mouth daily before breakfast.   nitroGLYCERIN (NITROSTAT) 0.4 MG SL tablet Place 1 tablet (0.4 mg total) under the tongue every 5 (five) minutes as needed for chest pain.   ondansetron (ZOFRAN) 4 MG tablet Take 1 tablet (4 mg total) by mouth every 8 (eight) hours as needed for nausea or vomiting.   oxyCODONE (OXY IR/ROXICODONE) 5 MG immediate release tablet Take 1 tablet (5 mg total) by mouth every 4 (four) hours as needed for severe pain.   polyethylene glycol (MIRALAX / GLYCOLAX) 17 g packet Take 17 g by mouth daily as needed for moderate constipation.   vitamin B-12 (CYANOCOBALAMIN) 500 MCG tablet Take 500 mcg by mouth daily.   Current Facility-Administered Medications for the 08/12/22 encounter (Office Visit) with Lindell Spar, MD  Medication   0.9 %  sodium chloride infusion   0.9 %  sodium chloride infusion      Allergies:   Dicyclomine, Glucophage [metformin], and Losartan   ROS:   Please see the history of present illness.     All other systems reviewed and are negative.   Labs/Other Tests and Data Reviewed:    Recent Labs: 07/25/2022: TSH 4.145 07/26/2022: ALT 40; Magnesium 1.9 07/27/2022: Hemoglobin 8.5; Platelets 125 07/28/2022: BUN 17; Creatinine, Ser 0.92; Potassium 3.7; Sodium 137   Recent Lipid Panel No results found for: "CHOL", "TRIG", "HDL", "CHOLHDL", "LDLCALC", "LDLDIRECT"  Wt Readings from Last 3 Encounters:  07/25/22 131 lb 9.8 oz (59.7 kg)  07/23/22 130 lb 15.3 oz (59.4 kg)  07/12/22 131 lb (59.4 kg)     ASSESSMENT & PLAN:    Closed displaced fracture of sixth cervical vertebra with routine healing Has cervical collar in place Denies severe neck pain currently F/u with Dr Arnoldo Morale  Compression fracture of L2 [facility]) Getting home PT Has back brace Pain meds PRN F/u with Dr [facility] discharge follow-up Hospital chart reviewed, including discharge summary Had fall due to AMS (?)/dizziness - has cervical and lumbar fractures, has cervical collar and back brace Sepsis and acute metabolic encephalopathy secondary to UTI -resolved now Planned to follow-up with urology in outpatient setting  Skin ulcer of sacrum, limited to breakdown of skin ([place]) Has stage I-II sacral ulcer Frequent repositioning Referred to home health for wound care and nursing evaluation  Acute metabolic encephalopathy Likely due to UTI - now resolved  Nausea Zofran PRN Needs to improve hydration and eat at regular intervals  Time:   Today, I have spent 25 minutes reviewing the chart, including problem list, medications, and with the patient with telehealth technology discussing the above problems.   Medication Adjustments/Labs and Tests Ordered: Current medicines are reviewed at length with the patient today.  Concerns regarding medicines are outlined above.   Tests  Ordered: Orders Placed This Encounter  Procedures   Ambulatory referral to Home Health    Medication Changes: Meds ordered this encounter  Medications   ondansetron (ZOFRAN) 4 MG tablet    Sig: Take 1 tablet (4 mg total) by mouth every 8 (eight) hours as needed for nausea or vomiting.    Dispense:  20 tablet    Refill:  0     Note: This dictation was prepared with Dragon dictation along with smaller phrase technology. Similar sounding words can be transcribed inadequately or may not be corrected upon review. Any transcriptional errors that result from this  process are unintentional.      Disposition:  Follow up  Signed, Lindell Spar, MD  08/12/2022 5:53 PM     [place]

## 2022-08-12 NOTE — Telephone Encounter (Signed)
Transition Care Management Follow-up Telephone Call Date of discharge and from where: 08-08-22 How have you been since you were released from the hospital? Doing pretty good but has constipation Any questions or concerns? No  Items Reviewed: Did the pt receive and understand the discharge instructions provided? Yes  Medications obtained and verified? Yes  Other? No  Any new allergies since your discharge? No  Dietary orders reviewed? Yes Do you have support at home? Yes   Home Care and Equipment/Supplies: Were home health services ordered? yes If so, what is the name of the agency? [facility] Has the agency set up a time to come to the patient's home? yes Were any new equipment or medical supplies ordered?  No What is the name of the medical supply agency? NA Were you able to get the supplies/equipment? not applicable Do you have any questions related to the use of the equipment or supplies? No  Functional Questionnaire: (I = Independent and D = Dependent) ADLs: d  Bathing/Dressing- d  Meal Prep- d  Eating- i  Maintaining continence- d  Transferring/Ambulation- d  Managing Meds- d  Follow up appointments reviewed:  PCP Hospital f/u appt confirmed? Yes  Scheduled to see Posey Pronto on 08-12-22 @ 8:20. [facility] f/u appt confirmed? No   Are transportation arrangements needed? No  If their condition worsens, is the pt aware to call PCP or go to the Emergency Dept.? Yes Was the patient provided with contact information for the PCP's office or ED? Yes Was to pt encouraged to call back with questions or concerns? Yes

## 2022-08-12 NOTE — Assessment & Plan Note (Signed)
Has cervical collar in place Denies severe neck pain currently F/u with Dr Arnoldo Morale

## 2022-08-12 NOTE — Assessment & Plan Note (Signed)
Getting home PT Has back brace Pain meds PRN F/u with Dr Arnoldo Morale

## 2022-08-12 NOTE — Assessment & Plan Note (Signed)
Has stage I-II sacral ulcer Frequent repositioning Referred to home health for wound care and nursing evaluation

## 2022-08-12 NOTE — Patient Instructions (Signed)
Please continue taking medications as prescribed.  Please maintain adequate hydration by taking at least 50 ounces of fluid in a day and eat at regular intervals.

## 2022-08-13 ENCOUNTER — Ambulatory Visit: Payer: Medicare Other | Admitting: Internal Medicine

## 2022-08-15 ENCOUNTER — Inpatient Hospital Stay: Payer: Medicare Other

## 2022-08-18 ENCOUNTER — Ambulatory Visit (INDEPENDENT_AMBULATORY_CARE_PROVIDER_SITE_OTHER): Payer: Medicare Other

## 2022-08-18 DIAGNOSIS — Z0000 Encounter for general adult medical examination without abnormal findings: Secondary | ICD-10-CM

## 2022-08-18 NOTE — Progress Notes (Signed)
Subjective:   Tasha Roberts is a 81 y.o. female who presents for Medicare Annual (Subsequent) preventive examination. I connected with  [name] on 08/18/22 by a audio enabled telemedicine application and verified that I am speaking with the correct person using two identifiers.  Patient Location: Home  Provider Location: Office/Clinic  I discussed the limitations of evaluation and management by telemedicine. The patient expressed understanding and agreed to proceed.   Review of Systems     Tasha Roberts , Thank you for taking time to come for your Medicare Wellness Visit. I appreciate your ongoing commitment to your health goals. Please review the following plan we discussed and let me know if I can assist you in the future.   These are the goals we discussed:  Goals      Patient Stated     Get new car        This is a list of the screening recommended for you and due dates:  Health Maintenance  Topic Date Due   Eye exam for diabetics  Never done   Tetanus Vaccine  Never done   Zoster (Shingles) Vaccine (1 of 2) Never done   DEXA scan (bone density measurement)  Never done   COVID-19 Vaccine (3 - Moderna risk series) 08/12/2021   Flu Shot  07/08/2022   Hemoglobin A1C  10/08/2022   Complete foot exam   04/08/2023   Urine Protein Check  04/08/2023   Pneumonia Vaccine  Completed   Hepatitis C Screening: USPSTF Recommendation to screen - Ages 18-79 yo.  Completed   HPV Vaccine  Aged Out          Objective:    There were no vitals filed for this visit. There is no height or weight on file to calculate BMI.     08/11/2022    2:31 AM 07/25/2022    5:49 AM 07/24/2022    8:50 PM 07/23/2022    1:34 AM 07/12/2022   11:45 AM 06/30/2022    3:21 PM 03/28/2022   12:00 PM  Advanced Directives  Does Patient Have a Medical Advance Directive? No Yes No No No No No  Type of Advance Directive Healthcare Power of [place]       Does patient want to make  changes to medical advance directive? No - Patient declined Yes (Inpatient - patient defers changing a medical advance directive and declines information at this time)       Copy of [place] in Chart? No - copy requested No - copy requested       Would patient like information on creating a medical advance directive? No - Patient declined No - Patient declined  No - Patient declined  No - Patient declined     Current Medications (verified) Outpatient Encounter Medications as of 08/18/2022  Medication Sig   atorvastatin (LIPITOR) 20 MG tablet TAKE (1) TABLET BY MOUTH ONCE DAILY. (Patient taking differently: Take 20 mg by mouth daily.)   Cholecalciferol 50 MCG (2000 UT) CAPS Take 1 capsule (2,000 Units total) by mouth daily.   clobetasol ointment (TEMOVATE) 0.05 % Apply topically at bedtime.   cyclobenzaprine (FLEXERIL) 10 MG tablet Take 1 tablet (10 mg total) by mouth 2 (two) times daily as needed for muscle spasms.   diltiazem (CARDIZEM CD) 120 MG 24 hr capsule TAKE (1) CAPSULE BY MOUTH ONCE DAILY.   docusate sodium (COLACE) 100 MG capsule Take 1 capsule (100 mg total) by  mouth every 12 (twelve) hours.   feeding supplement (ENSURE ENLIVE / ENSURE PLUS) LIQD Take 237 mLs by mouth 2 (two) times daily between meals.   levothyroxine (SYNTHROID) 100 MCG tablet Take 1 tablet (100 mcg total) by mouth daily before breakfast.   nitroGLYCERIN (NITROSTAT) 0.4 MG SL tablet Place 1 tablet (0.4 mg total) under the tongue every 5 (five) minutes as needed for chest pain.   ondansetron (ZOFRAN) 4 MG tablet Take 1 tablet (4 mg total) by mouth every 8 (eight) hours as needed for nausea or vomiting.   oxyCODONE (OXY IR/ROXICODONE) 5 MG immediate release tablet Take 1 tablet (5 mg total) by mouth every 4 (four) hours as needed for severe pain.   polyethylene glycol (MIRALAX / GLYCOLAX) 17 g packet Take 17 g by mouth daily as needed for moderate constipation.   vitamin B-12 (CYANOCOBALAMIN) 500 MCG  tablet Take 500 mcg by mouth daily.   Facility-Administered Encounter Medications as of 08/18/2022  Medication   0.9 %  sodium chloride infusion   0.9 %  sodium chloride infusion    Allergies (verified) Dicyclomine, Glucophage [metformin], and Losartan   History: Past Medical History:  Diagnosis Date   Anemia due to blood loss    iron infusion's last one 03-25-2021 (pt bleeding post vulvar bx 02-11-2021, hospiatal admission 02-21-2021)   Anticoagulant long-term use    eliquis--- managed by pcp    Arthritis    hands   Bronchiectasis [facility])    pulmonology --- dr Halford Chessman   (04-01-2021  pt stated no supplemental oxygen use since 01/ 2022 and no inhaler use,  stated checks O2 at home during the day 98-99%)   Cancer ([place]) 2013   Lumpectomy left breast   Cataract 2015   Chronic constipation    Clotting disorder ([place])    On eliquis   Coronary artery calcification    per lexiscan 08-17-2019 result in care everywhere   Depression, major, single episode, moderate ([place]) 12/05/2021   DM II (diabetes mellitus, type II), controlled ([place]) 08/27/2005   DOE (dyspnea on exertion)    04-01-2021  per pt only going up stairs    Gastroesophageal reflux disease 12/05/2021   History of diabetes mellitus, type II    04-01-2021  per pt no issues after weight loss, last taken medication approx 2017   History of Graves' disease 2017   s/p RAI   History of left breast cancer    dx 2013,  s/p left partial mastecotmy w/ node dissection 09-15-2012, low grade DCIS, no chemo, completed radiation 02/ 2014,  per pt no recurrence   History of respiratory syncytial virus (RSV) infection 09/2020   w/ hypoxia  , hospital admission 10/ 2021  and acute exacerbation bronchiectasis;  follow up post hospital pulmonogy dr Halford Chessman  note in epic   Hyperlipidemia    Hypertension    followed by pcp   (pt had lexiscan in care everywhere dated 08-17-2019 no ischemia , non-obstructive extensive calcification, ef 69%)    Hypothyroidism    Hypothyroidism, postradioiodine therapy    followed by pcp   Liver cirrhosis secondary to NASH (nonalcoholic steatohepatitis) ([place])    followed by dr Merrilee Jansky ([facility])---- dx 2004,  compensated ,  last liver bx (03/ 2015) fibrosis stage 3;  moderate portal hypertensive gastrophy   PAF (paroxysmal atrial fibrillation) [facility]) previous cardiologist lov note w/ Suann Larry PA ([facility] in [place]) dated 01-24-2020 in care everywhere;  (04-01-2021 per pt has appt w/  new cardiologist , dr j. 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 kDelton Coombes  VIN III (vulvar intraepithelial neoplasia III)    Past Surgical History:  Procedure Laterality Date   [place]  BREAST SURGERY  09/15/2012   CATARACT EXTRACTION W/ INTRAOCULAR LENS  IMPLANT, BILATERAL  2016   CHOLECYSTECTOMY OPEN  10623  CO2 LASER APPLICATION N/A [id number]/[id number]/3151  Procedure: CO2 LASER APPLICATION OF THE VULVA;  Surgeon: REveritt Amber MD;  [facility]  Service: Gynecology;  Laterality: N/A;   COLONOSCOPY WITH ESOPHAGOGASTRODUODENOSCOPY (EGD)  last one 08/ 2018  @[facility]    EYE SURGERY  2015   Cataract removed both eyes   LUMBAR LAMINECTOMY  2009   PARTIAL MASTECTOMY WITH AXILLARY SENTINEL LYMPH NODE BIOPSY [place]  TUBAL LIGATION  08/08/1969   Family History  Problem Relation Age  of Onset   Hypertension Mother    Dementia Mother    Vision loss Mother    Heart disease Father    Hearing loss Father    Stroke Maternal Grandfather    Heart disease Brother    Social History   Socioeconomic History   Marital status: Widowed    Spouse name: Not on file   Number of children: 3   Years of education: Not on file   Highest education level: Not on file  Occupational History   Occupation: retired  Tobacco Use   Smoking status: Never   Smokeless tobacco: Never  Vaping Use   Vaping Use: Never used  Substance and Sexual Activity   Alcohol use: Not Currently   Drug use: Never   Sexual activity: Not Currently    Birth control/protection: Post-menopausal  Other Topics Concern   Not on file  Social History Narrative   Not on file   Social Determinants of Health   Financial Resource Strain: [place] (08/10/2021)   Overall Financial Resource Strain (CARDIA)    Difficulty of Paying Living Expenses: Not hard at all  Food Insecurity: No Food Insecurity (08/10/2021)   Hunger Vital Sign    Worried About Running Out of Food in the Last Year: Never true    RStephensonin the Last Year: Never true  Transportation Needs: No Transportation Needs (08/10/2021)   PRAPARE - THydrologist(Medical): No    Lack of Transportation (Non-Medical): No  Physical Activity: Insufficiently Active (08/10/2021)   Exercise Vital Sign    Days of Exercise per Week: 3 days    Minutes of Exercise per Session: 30 min  Stress: No Stress Concern Present (08/10/2021)   [place]   Feeling of Stress : Not at all  Social Connections: Moderately Isolated (08/10/2021)   Social Connection and Isolation Panel [NHANES]    Frequency of Communication with  Friends and Family: More than three times a week    Frequency of Social Gatherings with Friends and Family: More than three times a week    Attends Religious  Services: More than 4 times per year    Active Member of [employer] or Organizations: No    Attends Archivist Meetings: Never    Marital Status: Widowed    Tobacco Counseling Counseling given: Not Answered   Clinical Intake:                 Diabetic? No         Activities of Daily Living    07/25/2022    6:02 AM 07/25/2022    5:56 AM  In your present state of health, do you have any difficulty performing the following activities:  Doing errands, shopping? 1 1    Patient Care Team: Lindell Spar, MD as PCP - General (Internal Medicine) Harl Bowie Alphonse Guild, MD as PCP - Cardiology (Cardiology)  Indicate any recent Medical Services you may have received from other than [facility] providers in the past year (date may be approximate).     Assessment:   This is a routine wellness examination for Tasha Roberts.  Hearing/Vision screen No results found.  Dietary issues and exercise activities discussed:     Goals Addressed   None   Depression Screen    08/12/2022    8:40 AM 08/12/2022    8:38 AM 04/07/2022    2:16 PM 12/05/2021    1:55 PM 12/05/2021    1:13 PM 08/10/2021   12:18 PM 08/10/2021   12:13 PM  PHQ 2/9 Scores  PHQ - 2 Score 0 0 0 0 0 0 0  PHQ- 9 Score 0   0       Fall Risk    08/12/2022    8:39 AM 08/12/2022    8:37 AM 04/07/2022    2:16 PM 12/05/2021    1:13 PM 08/10/2021   12:18 PM  Fall Risk   Falls in the past year? 0 0 0 0 0  Number falls in past yr: 0 0 0 0 0  Injury with Fall? 0 0 0 0 0  Risk for fall due to : No Fall Risks No Fall Risks No Fall Risks No Fall Risks No Fall Risks  Follow up Falls evaluation completed Falls evaluation completed Falls evaluation completed Falls evaluation completed Falls evaluation completed    [place]:  Any stairs in or around the home? Yes  If so, are there any without handrails? No  Home free of loose throw rugs in walkways, pet beds, electrical cords, etc? Yes  Adequate  lighting in your home to reduce risk of falls? Yes   ASSISTIVE DEVICES UTILIZED TO PREVENT FALLS:  Life alert? No  Use of a cane, walker or w/c? Yes  Grab bars in the bathroom? No  Shower chair or bench in shower? No  Elevated toilet seat or a handicapped toilet? Yes     Cognitive Function:    08/10/2021   12:20 PM  MMSE - Mini Mental State Exam  Not completed: Unable to complete        08/10/2021   12:20 PM  6CIT Screen  What Year? 0 points  What month? 0 points  What time? 0 points  Count back from 20 0 points  Months in reverse 0 points  Repeat phrase 0 points  Total Score 0 points    Immunizations  Immunization History  Administered Date(s) Administered   Fluad Quad(high Dose 65+) 12/02/2017, 10/10/2021   Influenza Split 09/01/2012   Influenza, High Dose Seasonal PF 09/17/2016, 11/15/2018   Influenza,inj,Quad PF,6+ Mos 09/16/2010, 09/01/2012, 09/06/2019   Moderna SARS-COV2 Booster Vaccination 07/15/2021   Moderna Sars-Covid-2 Vaccination 02/29/2020, 03/31/2020   Pneumococcal Conjugate-13 03/06/2016   Pneumococcal Polysaccharide-23 09/01/2012    TDAP status: Due, Education has been provided regarding the importance of this vaccine. Advised may receive this vaccine at local pharmacy or Health Dept. Aware to provide a copy of the vaccination record if obtained from local pharmacy or Health Dept. Verbalized acceptance and understanding.  Flu Vaccine status: Due, Education has been provided regarding the importance of this vaccine. Advised may receive this vaccine at local pharmacy or Health Dept. Aware to provide a copy of the vaccination record if obtained from local pharmacy or Health Dept. Verbalized acceptance and understanding.  Pneumococcal vaccine status: Up to date  Covid-19 vaccine status: Completed vaccines  Qualifies for Shingles Vaccine? Yes   Zostavax completed No   Shingrix Completed?: No.    Education has been provided regarding the importance of this  vaccine. Patient has been advised to call insurance company to determine out of pocket expense if they have not yet received this vaccine. Advised may also receive vaccine at local pharmacy or Health Dept. Verbalized acceptance and understanding.  Screening Tests Health Maintenance  Topic Date Due   OPHTHALMOLOGY EXAM  Never done   TETANUS/TDAP  Never done   Zoster Vaccines- Shingrix (1 of 2) Never done   DEXA SCAN  Never done   COVID-19 Vaccine (3 - Moderna risk series) 08/12/2021   INFLUENZA VACCINE  07/08/2022   HEMOGLOBIN A1C  10/08/2022   FOOT EXAM  04/08/2023   URINE MICROALBUMIN  04/08/2023   Pneumonia Vaccine 54+ Years old  Completed   Hepatitis C Screening  Completed   HPV VACCINES  Aged Out    Health Maintenance  Health Maintenance Due  Topic Date Due   OPHTHALMOLOGY EXAM  Never done   TETANUS/TDAP  Never done   Zoster Vaccines- Shingrix (1 of 2) Never done   DEXA SCAN  Never done   COVID-19 Vaccine (3 - Moderna risk series) 08/12/2021   INFLUENZA VACCINE  07/08/2022    Colorectal cancer screening: No longer required.   Mammogram status: No longer required due to age.    Lung Cancer Screening: (Low Dose CT Chest recommended if Age 26-80 years, 30 pack-year currently smoking OR have quit w/in 15years.) does not qualify.     Additional Screening:  Hepatitis C Screening: does qualify; Completed 03/15/2021  Vision Screening: Recommended annual ophthalmology exams for early detection of glaucoma and other disorders of the eye. Is the patient up to date with their annual eye exam?  Yes  Who is the provider or what is the name of the office in which the patient attends annual eye exams? [facility]  Dental Screening: Recommended annual dental exams for proper oral hygiene  Community Resource Referral / Chronic Care Management: CRR required this visit?  No   CCM required this visit?  No      Plan:     I have personally reviewed and noted the following  in the patient's chart:   Medical and social history Use of alcohol, tobacco or illicit drugs  Current medications and supplements including opioid prescriptions. Patient is currently taking opioid prescriptions. Information provided to patient regarding non-opioid alternatives. Patient advised to discuss  non-opioid treatment plan with their provider. Functional ability and status Nutritional status Physical activity Advanced directives List of other physicians Hospitalizations, surgeries, and ER visits in previous 12 months Vitals Screenings to include cognitive, depression, and falls Referrals and appointments  In addition, I have reviewed and discussed with patient certain preventive protocols, quality metrics, and best practice recommendations. A written personalized care plan for preventive services as well as general preventive health recommendations were provided to patient.     Johny Drilling, [place]   08/18/2022   Nurse Notes:  Tasha Roberts , Thank you for taking time to come for your Medicare Wellness Visit. I appreciate your ongoing commitment to your health goals. Please review the following plan we discussed and let me know if I can assist you in the future.   These are the goals we discussed:  Goals      Patient Stated     Get new car        This is a list of the screening recommended for you and due dates:  Health Maintenance  Topic Date Due   Eye exam for diabetics  Never done   Tetanus Vaccine  Never done   Zoster (Shingles) Vaccine (1 of 2) Never done   DEXA scan (bone density measurement)  Never done   COVID-19 Vaccine (3 - Moderna risk series) 08/12/2021   Flu Shot  07/08/2022   Hemoglobin A1C  10/08/2022   Complete foot exam   04/08/2023   Urine Protein Check  04/08/2023   Pneumonia Vaccine  Completed   Hepatitis C Screening: USPSTF Recommendation to screen - Ages 18-79 yo.  Completed   HPV Vaccine  Aged Out

## 2022-08-18 NOTE — Patient Instructions (Signed)
Tasha Roberts , Thank you for taking time to come for your Medicare Wellness Visit. I appreciate your ongoing commitment to your health goals. Please review the following plan we discussed and let me know if I can assist you in the future.   Screening recommendations/referrals: Recommended yearly ophthalmology/optometry visit for glaucoma screening and checkup Recommended yearly dental visit for hygiene and checkup  Vaccinations: Influenza vaccine: Due Pneumococcal vaccine: Completed Tdap vaccine: Due Shingles vaccine: Due    Advanced directives: Bring a copy  Conditions/risks identified: Falls, Hypertension  Next appointment: 1 year   Preventive Care 79 Years and Older, Female Preventive care refers to lifestyle choices and visits with your health care provider that can promote health and wellness. What does preventive care include? A yearly physical exam. This is also called an annual well check. Dental exams once or twice a year. Routine eye exams. Ask your health care provider how often you should have your eyes checked. Personal lifestyle choices, including: Daily care of your teeth and gums. Regular physical activity. Eating a healthy diet. Avoiding tobacco and drug use. Limiting alcohol use. Practicing safe sex. Taking low-dose aspirin every day. Taking vitamin and mineral supplements as recommended by your health care provider. What happens during an annual well check? The services and screenings done by your health care provider during your annual well check will depend on your age, overall health, lifestyle risk factors, and family history of disease. Counseling  Your health care provider may ask you questions about your: Alcohol use. Tobacco use. Drug use. Emotional well-being. Home and relationship well-being. Sexual activity. Eating habits. History of falls. Memory and ability to understand (cognition). Work and work Statistician. Reproductive  health. Screening  You may have the following tests or measurements: Height, weight, and BMI. Blood pressure. Lipid and cholesterol levels. These may be checked every 5 years, or more frequently if you are over 19 years old. Skin check. Lung cancer screening. You may have this screening every year starting at age 12 if you have a 30-pack-year history of smoking and currently smoke or have quit within the past 15 years. Fecal occult blood test (FOBT) of the stool. You may have this test every year starting at age 46. Flexible sigmoidoscopy or colonoscopy. You may have a sigmoidoscopy every 5 years or a colonoscopy every 10 years starting at age 7. Hepatitis C blood test. Hepatitis B blood test. Sexually transmitted disease (STD) testing. Diabetes screening. This is done by checking your blood sugar (glucose) after you have not eaten for a while (fasting). You may have this done every 1-3 years. Bone density scan. This is done to screen for osteoporosis. You may have this done starting at age 25. Mammogram. This may be done every 1-2 years. Talk to your health care provider about how often you should have regular mammograms. Talk with your health care provider about your test results, treatment options, and if necessary, the need for more tests. Vaccines  Your health care provider may recommend certain vaccines, such as: Influenza vaccine. This is recommended every year. Tetanus, diphtheria, and acellular pertussis (Tdap, Td) vaccine. You may need a Td booster every 10 years. Zoster vaccine. You may need this after age 75. Pneumococcal 13-valent conjugate (PCV13) vaccine. One dose is recommended after age 73. Pneumococcal polysaccharide (PPSV23) vaccine. One dose is recommended after age 66. Talk to your health care provider about which screenings and vaccines you need and how often you need them. This information is not intended to replace  advice given to you by your health care provider.  Make sure you discuss any questions you have with your health care provider. Document Released: 12/21/2015 Document Revised: 08/13/2016 Document Reviewed: 09/25/2015 Elsevier Interactive Patient Education  2017 [employer] Prevention in the Home Falls can cause injuries. They can happen to people of all ages. There are many things you can do to make your home safe and to help prevent falls. What can I do on the outside of my home? Regularly fix the edges of walkways and driveways and fix any cracks. Remove anything that might make you trip as you walk through a door, such as a raised step or threshold. Trim any bushes or trees on the path to your home. Use bright outdoor lighting. Clear any walking paths of anything that might make someone trip, such as rocks or tools. Regularly check to see if handrails are loose or broken. Make sure that both sides of any steps have handrails. Any raised decks and porches should have guardrails on the edges. Have any leaves, snow, or ice cleared regularly. Use sand or salt on walking paths during winter. Clean up any spills in your garage right away. This includes oil or grease spills. What can I do in the bathroom? Use night lights. Install grab bars by the toilet and in the tub and shower. Do not use towel bars as grab bars. Use non-skid mats or decals in the tub or shower. If you need to sit down in the shower, use a plastic, non-slip stool. Keep the floor dry. Clean up any water that spills on the floor as soon as it happens. Remove soap buildup in the tub or shower regularly. Attach bath mats securely with double-sided non-slip rug tape. Do not have throw rugs and other things on the floor that can make you trip. What can I do in the bedroom? Use night lights. Make sure that you have a light by your bed that is easy to reach. Do not use any sheets or blankets that are too big for your bed. They should not hang down onto the floor. Have a  firm chair that has side arms. You can use this for support while you get dressed. Do not have throw rugs and other things on the floor that can make you trip. What can I do in the kitchen? Clean up any spills right away. Avoid walking on wet floors. Keep items that you use a lot in easy-to-reach places. If you need to reach something above you, use a strong step stool that has a grab bar. Keep electrical cords out of the way. Do not use floor polish or wax that makes floors slippery. If you must use wax, use non-skid floor wax. Do not have throw rugs and other things on the floor that can make you trip. What can I do with my stairs? Do not leave any items on the stairs. Make sure that there are handrails on both sides of the stairs and use them. Fix handrails that are broken or loose. Make sure that handrails are as long as the stairways. Check any carpeting to make sure that it is firmly attached to the stairs. Fix any carpet that is loose or worn. Avoid having throw rugs at the top or bottom of the stairs. If you do have throw rugs, attach them to the floor with carpet tape. Make sure that you have a light switch at the top of the stairs and the bottom  of the stairs. If you do not have them, ask someone to add them for you. What else can I do to help prevent falls? Wear shoes that: Do not have high heels. Have rubber bottoms. Are comfortable and fit you well. Are closed at the toe. Do not wear sandals. If you use a stepladder: Make sure that it is fully opened. Do not climb a closed stepladder. Make sure that both sides of the stepladder are locked into place. Ask someone to hold it for you, if possible. Clearly mark and make sure that you can see: Any grab bars or handrails. First and last steps. Where the edge of each step is. Use tools that help you move around (mobility aids) if they are needed. These include: Canes. Walkers. Scooters. Crutches. Turn on the lights when you  go into a dark area. Replace any light bulbs as soon as they burn out. Set up your furniture so you have a clear path. Avoid moving your furniture around. If any of your floors are uneven, fix them. If there are any pets around you, be aware of where they are. Review your medicines with your doctor. Some medicines can make you feel dizzy. This can increase your chance of falling. Ask your doctor what other things that you can do to help prevent falls. This information is not intended to replace advice given to you by your health care provider. Make sure you discuss any questions you have with your health care provider. Document Released: 09/20/2009 Document Revised: 05/01/2016 Document Reviewed: 12/29/2014 Elsevier Interactive Patient Education  2017 [employer].

## 2022-08-19 DIAGNOSIS — K7460 Unspecified cirrhosis of liver: Secondary | ICD-10-CM | POA: Diagnosis not present

## 2022-08-19 DIAGNOSIS — K7581 Nonalcoholic steatohepatitis (NASH): Secondary | ICD-10-CM

## 2022-08-19 DIAGNOSIS — E079 Disorder of thyroid, unspecified: Secondary | ICD-10-CM

## 2022-08-19 DIAGNOSIS — E119 Type 2 diabetes mellitus without complications: Secondary | ICD-10-CM | POA: Diagnosis not present

## 2022-08-19 DIAGNOSIS — E559 Vitamin D deficiency, unspecified: Secondary | ICD-10-CM

## 2022-08-19 DIAGNOSIS — M5090 Cervical disc disorder, unspecified, unspecified cervical region: Secondary | ICD-10-CM

## 2022-08-19 DIAGNOSIS — D649 Anemia, unspecified: Secondary | ICD-10-CM

## 2022-08-19 DIAGNOSIS — E785 Hyperlipidemia, unspecified: Secondary | ICD-10-CM

## 2022-08-19 DIAGNOSIS — I10 Essential (primary) hypertension: Secondary | ICD-10-CM | POA: Diagnosis not present

## 2022-08-19 DIAGNOSIS — F32A Depression, unspecified: Secondary | ICD-10-CM

## 2022-08-19 DIAGNOSIS — I480 Paroxysmal atrial fibrillation: Secondary | ICD-10-CM | POA: Diagnosis not present

## 2022-08-19 DIAGNOSIS — M8008XD Age-related osteoporosis with current pathological fracture, vertebra(e), subsequent encounter for fracture with routine healing: Secondary | ICD-10-CM

## 2022-08-21 DIAGNOSIS — I4891 Unspecified atrial fibrillation: Secondary | ICD-10-CM

## 2022-08-22 ENCOUNTER — Telehealth: Payer: Self-pay

## 2022-08-22 NOTE — Telephone Encounter (Signed)
[name] nurse with [facility] [phone] called in concerned about patient having brown liquid stools, becoming more nauseated, and all quadrants being hyperactive. She is concerned that the pt may be experiencing a bowel blockage. Spoke with Kristin Bruins, advised nurse that Kristin Bruins recommends that she goes to the ER. [name] states that she will tell the pt but she doesn't think she will go and if need be will call Monday for other orders. [name] also wanted a verbal order to treat pt for a stage II pressure ulcer on buttocks. Kristin Bruins gave verbal. Sending to provider and Kristin Bruins as Juluis Rainier.

## 2022-08-22 NOTE — Telephone Encounter (Signed)
Called [name] to advise of Dr Serita Grit message no answer left vm called pt's [name] advised of Dr Serita Grit message she verbalized understanding

## 2022-08-28 ENCOUNTER — Telehealth: Payer: Self-pay

## 2022-08-28 ENCOUNTER — Other Ambulatory Visit: Payer: Self-pay

## 2022-08-28 ENCOUNTER — Telehealth: Payer: Self-pay | Admitting: Cardiology

## 2022-08-28 DIAGNOSIS — R32 Unspecified urinary incontinence: Secondary | ICD-10-CM

## 2022-08-28 MED ORDER — TOLTERODINE TARTRATE ER 4 MG PO CP24: 4.0000 mg | ORAL_CAPSULE | Freq: Every day | ORAL | 8 refills | Status: DC

## 2022-08-28 NOTE — Telephone Encounter (Signed)
Called and spoke to pt's daughter Tasha Roberts.   Pt was in the hospital 08/04/2022 - 08/08/2022 [facility] [place]) Pt's Eliquis was discontinued at that time and pt was sent home with [facility] services. Daughter stated pt has not been on Eliquis since then. Daughter stated pt was taken off Eliquis because she had a fall and had to have staples in her head. The staples since then have been removed.   Pt is scheduled for a watchman procedure on 10/23/2022.   Pt's daughter stated that the [facility] nurse informed them they needed to ask about restarting Eliquis.   Per care everywhere, summary from [facility] note from hospital stay 08/04/2022-08/08/2022:   Tasha Roberts is a 81 y.o. female who was admitted 8/29 after falling at [facility] and rehab. She was found to have altered mental status which daughter states has been present for the last couple of weeks as well as fractures of the C spine. Orthopedic trauma surgeon at [facility] was consulted and recommended c-collar, discontinuation of Eliquis, and outpatient referral. She was admitted for treatment and management.    Anemia; this is chronic for her. She was scheduled to get iron infusions outpatient but has been unable to get them due to being a resident at [facility] and rehab. Hemoglobin 9.7 today, received a total of 2 units of blood while admitted (8/30, 8/31). Patient has not noticed any blood in her stool, guaiac ordered. Hemoglobin was as low as 6.8. 4. Paroxysmal atrial fibrillation; rate is controlled, 70s to 80s. Eliquis has been stopped due to risk-benefit decision and discussion with daughter Tasha Roberts due to frequent falls.

## 2022-08-28 NOTE — Telephone Encounter (Signed)
Returned patients call. Detrol d/c'd during hospital visit. New rx sent to pharmacy.

## 2022-08-28 NOTE — Telephone Encounter (Signed)
I'll defer to Dr Harl Bowie, but could consider eliquis 2.5 mg BID as a compromise. While that won't fully protect her from stroke, it's better than nothing and there is data for this dose around Watchman implantation.

## 2022-08-28 NOTE — Telephone Encounter (Signed)
Patient called advising she needed a refill on last medication she was prescribed by Dr. Alyson Ingles sent to below pharmacy.   Pharmacy:  [place], [place]

## 2022-08-28 NOTE — Telephone Encounter (Signed)
Pt c/o medication issue:  1. Name of Medication:  Eliquis  2. How are you currently taking this medication (dosage and times per day)?   3. Are you having a reaction (difficulty breathing--STAT)?   4. What is your medication issue? Patient states she was taken off of Eliquis while admitted and she would like to know if she should start back taking it. She would also like to discuss having watchman procedure.

## 2022-08-28 NOTE — Telephone Encounter (Signed)
Spoke with the patient's daughter, [name], who states the patient is doing much better after her hospitalization and she still wants to have the Watchman implant.  The patient is currently holding Eliquis   She is tentatively scheduled for LAAO on 11/16 with Dr. Burt Knack. Scheduled the patient for evaluation with Dr. Burt Knack on 10/18.  She understands at that time they will decide together to proceed or cancel procedure. She understands that if the patient has not restarted Eliquis prior to eval with Dr. Burt Knack, he will tell her when to restart for the procedure. [name] was grateful for call and agrees with plan.

## 2022-09-01 ENCOUNTER — Ambulatory Visit (INDEPENDENT_AMBULATORY_CARE_PROVIDER_SITE_OTHER): Payer: Medicare Other | Admitting: Internal Medicine

## 2022-09-01 ENCOUNTER — Encounter: Payer: Self-pay | Admitting: Internal Medicine

## 2022-09-01 ENCOUNTER — Other Ambulatory Visit: Payer: Self-pay | Admitting: *Deleted

## 2022-09-01 ENCOUNTER — Telehealth: Payer: Self-pay | Admitting: Cardiology

## 2022-09-01 VITALS — BP 128/82 | HR 83 | Resp 18 | Ht 63.0 in | Wt 120.4 lb

## 2022-09-01 DIAGNOSIS — E039 Hypothyroidism, unspecified: Secondary | ICD-10-CM | POA: Diagnosis not present

## 2022-09-01 DIAGNOSIS — S32020S Wedge compression fracture of second lumbar vertebra, sequela: Secondary | ICD-10-CM | POA: Diagnosis not present

## 2022-09-01 DIAGNOSIS — R110 Nausea: Secondary | ICD-10-CM

## 2022-09-01 DIAGNOSIS — I480 Paroxysmal atrial fibrillation: Secondary | ICD-10-CM | POA: Diagnosis not present

## 2022-09-01 DIAGNOSIS — E440 Moderate protein-calorie malnutrition: Secondary | ICD-10-CM

## 2022-09-01 DIAGNOSIS — N3941 Urge incontinence: Secondary | ICD-10-CM

## 2022-09-01 DIAGNOSIS — R1311 Dysphagia, oral phase: Secondary | ICD-10-CM

## 2022-09-01 MED ORDER — ONDANSETRON HCL 4 MG PO TABS: 4.0000 mg | ORAL_TABLET | Freq: Three times a day (TID) | ORAL | 0 refills | Status: DC | PRN

## 2022-09-01 NOTE — Telephone Encounter (Signed)
Pt c/o medication issue:  1. Name of Medication: Eliquis  2. How are you currently taking this medication (dosage and times per day)? N/A  3. Are you having a reaction (difficulty breathing--STAT)? No  4. What is your medication issue? Daughter states that she is awaiting call as to whether or not pt should continue Not taking medication or start back.Please advise

## 2022-09-01 NOTE — Assessment & Plan Note (Signed)
Lab Results  Component Value Date   TSH 4.145 07/25/2022   On Levothyroxine

## 2022-09-01 NOTE — Telephone Encounter (Signed)
See 08/28/2022 phone note.  "Sherren Mocha, MD to Me  Gwenlyn Saran, MD  Lutricia Feil, RN     08/28/22  2:47 PM Note I'll defer to Dr Harl Bowie, but could consider eliquis 2.5 mg BID as a compromise. While that won't fully protect her from stroke, it's better than nothing and there is data for this dose around Watchman implantation.      Will route to Dr. Harl Bowie and his Triage team.

## 2022-09-01 NOTE — Progress Notes (Signed)
Established Patient Office Visit  Subjective:  Patient ID: Tasha Roberts, female    DOB: 1941/04/03  Age: 81 y.o. MRN: [id number]  CC:  Chief Complaint  Patient presents with   Follow-up    Follow up ever since coming home from hospital in September pt cannot eat solid foods she gags     HPI Tasha Roberts is a 81 y.o. female with past medical history of HTN, paroxysmal A Fib, NASH, hypothyroidism, breast ca s/p left mastectomy, iron deficiency anemia, thrombocytopenia, osteoporosis and chronic loose BM who presents for f/u of her chronic medical conditions.  UTI and urinary incontinence: She has had recent hospitalizations for acute metabolic encephalopathy due to UTI.  She has been placed on Detrol for urinary incontinence, which is better controlled now.  She denies any dysuria or hematuria currently.  She had compression fracture of L2 and C6-7.  Her c-collar and TLSO brace have been removed now after seeing neurosurgeon-Dr. Arnoldo Morale.  Her back pain has been improving slowly.  She complains of chronic dysphagia to solids, but later states that she does not have dentures.  She has gag reflex right after trying to put solid food in her mouth.  A-fib: Her Eliquis was held recently due to a fall.  She is currently taking diltiazem for rate control.  Denies any chest pain, dyspnea or palpitations.  Past Medical History:  Diagnosis Date   Age related osteoporosis    Anemia due to blood loss    iron infusion's last one 03-25-2021 (pt bleeding post vulvar bx 02-11-2021, hospiatal admission 02-21-2021)   Anticoagulant long-term use    eliquis--- managed by pcp    Arthritis    hands   Bronchiectasis [facility])    pulmonology --- dr Halford Chessman   (04-01-2021  pt stated no supplemental oxygen use since 01/ 2022 and no inhaler use,  stated checks O2 at home during the day 98-99%)   Cancer ([place]) 2013   Lumpectomy left breast   Cataract 2015   Chronic constipation    Clotting disorder ([place])    On eliquis    Coronary artery calcification    per lexiscan 08-17-2019 result in care everywhere   Depression, major, single episode, moderate ([place]) 12/05/2021   DM II (diabetes mellitus, type II), controlled ([place]) 08/27/2005   DOE (dyspnea on exertion)    04-01-2021  per pt only going up stairs    Gastroesophageal reflux disease 12/05/2021   History of diabetes mellitus, type II    04-01-2021  per pt no issues after weight loss, last taken medication approx 2017   History of Graves' disease 2017   s/p RAI   History of left breast cancer    dx 2013,  s/p left partial mastecotmy w/ node dissection 09-15-2012, low grade DCIS, no chemo, completed radiation 02/ 2014,  per pt no recurrence   History of respiratory syncytial virus (RSV) infection 09/2020   w/ hypoxia  , hospital admission 10/ 2021  and acute exacerbation bronchiectasis;  follow up post hospital pulmonogy dr Halford Chessman  note in epic   Hyperlipidemia    Hypertension    followed by pcp   (pt had lexiscan in care everywhere dated 08-17-2019 no ischemia , non-obstructive extensive calcification, ef 69%)   Hypothyroidism    Hypothyroidism, postradioiodine therapy    followed by pcp   Liver cirrhosis secondary to NASH (nonalcoholic steatohepatitis) ([place])    followed by dr Merrilee Jansky ([facility])---- dx 2004,  compensated ,  last liver  bx (03/ 2015) fibrosis stage 3;  moderate portal hypertensive gastrophy   PAF (paroxysmal atrial fibrillation) ([place]) previous cardiologist lov note w/ Suann Larry PA ([facility] in [place]) dated 01-24-2020 in care everywhere;  (04-01-2021 per pt has appt w/ new cardiologist , dr j. branch 04-30-2021)   first dx 2020--  had event monitor/ stress test/ echo all results in care everywhere (monitor 08-03-2019 short runs AFib conversion pause <2 secconds, rate controlled;  echo 12-14-2019 ef 60%, mild concentric LVH, G2DD, RSVP 50.81mHg)   Personal history of radiation therapy 2013   left breast cancer,   completed 02/ 2014   PONV (postoperative nausea and vomiting)    Sarcoidosis, lung ([facility] pulmonology--- dr sHalford Chessman  04-01-2021  per pt dx age [age]s, no issue since   Sepsis secondary to UTI ([place] 03/27/2022   Thrombocytopenia ([facility]    hematology/ oncology--- dr s. kDelton Coombes  VIN III (vulvar intraepithelial neoplasia III)     Past Surgical History:  Procedure Laterality Date   [place]  BREAST SURGERY  09/15/2012   CATARACT EXTRACTION W/ INTRAOCULAR LENS  IMPLANT, BILATERAL  2016   CHOLECYSTECTOMY OPEN  19675  CO2 LASER APPLICATION N/A [id number]  Procedure: CO2 LASER APPLICATION OF THE VULVA;  Surgeon: REveritt Amber MD;  [place] [facility]  Service: Gynecology;  Laterality: N/A;   COLONOSCOPY WITH ESOPHAGOGASTRODUODENOSCOPY (EGD)  last one 08/ 2018  _0    EYE SURGERY  2015   Cataract removed both eyes   LUMBAR LAMINECTOMY  2009   PARTIAL MASTECTOMY WITH AXILLARY SENTINEL LYMPH NODE BIOPSY [place]15-[place]  TUBAL LIGATION  08/08/1969    Family History  Problem Relation Age of Onset   Hypertension Mother    Dementia Mother    Vision loss Mother    Heart disease Father    Hearing loss Father    Stroke Maternal Grandfather    Heart disease Brother     Social History   Socioeconomic History   Marital status: Widowed    Spouse name: Not on file   Number of children: 3   Years of education: Not on file   Highest education level: Not on file  Occupational History   Occupation: retired  Tobacco Use   Smoking status: Never   Smokeless tobacco: Never  Vaping Use   Vaping Use: Never used  Substance and Sexual Activity   Alcohol use: Not Currently   Drug use: Never   Sexual activity: Not Currently    Birth control/protection: Post-menopausal  Other Topics Concern   Not on file  Social History Narrative   Not on file   Social Determinants of Health    Financial Resource Strain: Low Risk  (08/18/2022)   Overall Financial Resource Strain (CARDIA)    Difficulty of Paying Living Expenses: Not hard at all  Food Insecurity: No Food Insecurity (08/18/2022)   Hunger Vital Sign    Worried About Running Out of Food in the Last Year: Never true    [place] the Last [place]: Never true  Transportation Needs: No Transportation Needs (08/18/2022)   PRAPARE - THydrologist(Medical): No    Lack of Transportation (Non-Medical): No  Physical Activity: Insufficiently Active (08/18/2022)   Exercise Vital Sign    Days of Exercise per Week: 2 days    Minutes  of Exercise per Session: 30 min  Stress: No Stress Concern Present (08/18/2022)   [place]    Feeling of Stress : Not at all  Social Connections: Moderately Isolated (08/18/2022)   Social Connection and Isolation Panel [NHANES]    Frequency of Communication with Friends and Family: More than three times a week    Frequency of Social Gatherings with Friends and Family: More than three times a week    Attends Religious Services: More than 4 times per year    Active Member of [employer] or Organizations: No    Attends Archivist Meetings: Never    Marital Status: Widowed  Intimate Partner Violence: Not At Risk (08/18/2022)   Humiliation, Afraid, Rape, and Kick questionnaire    Fear of Current or Ex-Partner: No    Emotionally Abused: No    Physically Abused: No    Sexually Abused: No    Outpatient Medications Prior to Visit  Medication Sig Dispense Refill   atorvastatin (LIPITOR) 20 MG tablet TAKE (1) TABLET BY MOUTH ONCE DAILY. (Patient taking differently: Take 20 mg by mouth daily.) 90 tablet 1   Cholecalciferol 50 MCG (2000 UT) CAPS Take 1 capsule (2,000 Units total) by mouth daily. 30 capsule    clobetasol ointment (TEMOVATE) 0.05 % Apply topically at bedtime.     cyclobenzaprine  (FLEXERIL) 10 MG tablet Take 1 tablet (10 mg total) by mouth 2 (two) times daily as needed for muscle spasms. 20 tablet 0   diltiazem (CARDIZEM CD) 120 MG 24 hr capsule TAKE (1) CAPSULE BY MOUTH ONCE DAILY. 90 capsule 1   docusate sodium (COLACE) 100 MG capsule Take 1 capsule (100 mg total) by mouth every 12 (twelve) hours. 60 capsule 0   feeding supplement (ENSURE ENLIVE / ENSURE PLUS) LIQD Take 237 mLs by mouth 2 (two) times daily between meals. 237 mL 12   levothyroxine (SYNTHROID) 100 MCG tablet Take 1 tablet (100 mcg total) by mouth daily before breakfast. 90 tablet 1   nitroGLYCERIN (NITROSTAT) 0.4 MG SL tablet Place 1 tablet (0.4 mg total) under the tongue every 5 (five) minutes as needed for chest pain. 10 tablet 0   oxyCODONE (OXY IR/ROXICODONE) 5 MG immediate release tablet Take 1 tablet (5 mg total) by mouth every 4 (four) hours as needed for severe pain. 10 tablet 0   polyethylene glycol (MIRALAX / GLYCOLAX) 17 g packet Take 17 g by mouth daily as needed for moderate constipation. 14 each 0   tolterodine (DETROL LA) 4 MG 24 hr capsule Take 1 capsule (4 mg total) by mouth daily. 30 capsule 8   vitamin B-12 (CYANOCOBALAMIN) 500 MCG tablet Take 500 mcg by mouth daily.     ondansetron (ZOFRAN) 4 MG tablet Take 1 tablet (4 mg total) by mouth every 8 (eight) hours as needed for nausea or vomiting. 20 tablet 0   Facility-Administered Medications Prior to Visit  Medication Dose Route Frequency Provider Last Rate Last Admin   0.9 %  sodium chloride infusion   Intravenous Continuous Sherren Mocha, MD       0.9 %  sodium chloride infusion   Intravenous Continuous Sherren Mocha, MD        Allergies  Allergen Reactions   Dicyclomine Other (See Comments)    Dry cough   Glucophage [Metformin] Diarrhea   Losartan Other (See Comments)    Heartburn     ROS Review of Systems  Constitutional:  Negative for chills  and fever.  HENT:  Negative for congestion, sinus pressure, sinus pain and  sore throat.   Eyes:  Negative for pain and discharge.  Respiratory:  Negative for cough and shortness of breath.   Cardiovascular:  Negative for chest pain and palpitations.  Gastrointestinal:  Positive for nausea. Negative for constipation and vomiting.  Endocrine: Negative for polydipsia and polyuria.  Genitourinary:  Negative for dysuria and hematuria.  Musculoskeletal:  Positive for back pain. Negative for neck pain and neck stiffness.  Skin:  Negative for rash.  Neurological:  Positive for weakness. Negative for dizziness.  Psychiatric/Behavioral:  Positive for sleep disturbance. Negative for agitation and behavioral problems. The patient is nervous/anxious.       Objective:    Physical Exam Vitals reviewed.  Constitutional:      General: She is not in acute distress.    Appearance: She is not diaphoretic.  HENT:     Head: Normocephalic and atraumatic.     Nose: Nose normal.     Mouth/Throat:     Mouth: Mucous membranes are moist.  Eyes:     General: No scleral icterus.    Extraocular Movements: Extraocular movements intact.  Neck:     Vascular: Carotid bruit present.  Cardiovascular:     Rate and Rhythm: Normal rate and regular rhythm.     Pulses: Normal pulses.     Heart sounds: Normal heart sounds. No murmur heard. Pulmonary:     Breath sounds: Normal breath sounds. No wheezing or rales.  Abdominal:     Palpations: Abdomen is soft.     Tenderness: There is no abdominal tenderness.  Musculoskeletal:     Cervical back: Neck supple. No tenderness.     Right lower leg: No edema.     Left lower leg: No edema.  Skin:    General: Skin is warm.     Findings: Bruising (Over b/l UE) present.  Neurological:     General: No focal deficit present.     Mental Status: She is alert and oriented to person, place, and time.     Sensory: No sensory deficit.     Motor: Weakness (B/l LE - 4/5) present.  Psychiatric:        Mood and Affect: Mood normal.        Behavior:  Behavior normal.     BP 128/82 (BP Location: Right Arm, Patient Position: Sitting, Cuff Size: Normal)   Pulse 83   Resp 18   Ht _0  (1.6 m)   Wt 120 lb 6.4 oz (54.6 kg)   SpO2 96%   BMI 21.33 kg/m  Wt Readings from Last 3 Encounters:  09/01/22 120 lb 6.4 oz (54.6 kg)  07/25/22 131 lb 9.8 oz (59.7 kg)  07/23/22 130 lb 15.3 oz (59.4 kg)    Lab Results  Component Value Date   TSH 4.145 07/25/2022   Lab Results  Component Value Date   WBC 6.7 07/27/2022   HGB 8.5 (L) 07/27/2022   HCT 27.2 (L) 07/27/2022   MCV 96.1 07/27/2022   PLT 125 (L) 07/27/2022   Lab Results  Component Value Date   NA 137 07/28/2022   K 3.7 07/28/2022   CO2 20 (L) 07/28/2022   GLUCOSE 139 (H) 07/28/2022   BUN 17 07/28/2022   CREATININE 0.92 07/28/2022   BILITOT 0.6 07/26/2022   ALKPHOS 101 07/26/2022   AST 26 07/26/2022   ALT 40 07/26/2022   PROT 5.3 (L) 07/26/2022   ALBUMIN 2.7 (L)  07/26/2022   CALCIUM 8.0 (L) 07/28/2022   ANIONGAP 8 07/28/2022   EGFR 58 (L) 05/09/2022   No results found for: "CHOL" No results found for: "HDL" No results found for: "LDLCALC" No results found for: "TRIG" No results found for: "CHOLHDL" Lab Results  Component Value Date   HGBA1C 5.4 04/07/2022      Assessment & Plan:   Problem List Items Addressed This Visit       Cardiovascular and Mediastinum   AF (paroxysmal atrial fibrillation) ([place]) - Primary    On Cardizem Recently held Eliquis due to a fall Followed by cardiology and [facility] Advised to contact them for Eliquis Planned to get left atrial appendage occlusion In sinus rhythm currently        Digestive   Oral phase dysphagia    Likely due to lack of dentures She has been getting EGD by her liver specialist, unlikely to be esophageal etiology        Endocrine   Acquired hypothyroidism    Lab Results  Component Value Date   TSH 4.145 07/25/2022  On Levothyroxine        Musculoskeletal and Integument   Compression  fracture of L2 ([place])    Getting home PT Had back brace Pain meds PRN F/u with Dr Arnoldo Morale        Other   Urge incontinence of urine    Well controlled with Detrol now Followed by Urology      Moderate protein-calorie malnutrition ([place])    Has lost about 14 lbs since 05/23 Likely due to poor PO intake due to lack of dentures, needs dental evaluation Advised to take Ensure supplement for now       No orders of the defined types were placed in this encounter.   Follow-up: Return in about 4 months (around 01/01/2023) for Weight check and dysphagia.    Lindell Spar, MD

## 2022-09-01 NOTE — Assessment & Plan Note (Signed)
Has lost about 14 lbs since 05/23 Likely due to poor PO intake due to lack of dentures, needs dental evaluation Advised to take Ensure supplement for now

## 2022-09-01 NOTE — Assessment & Plan Note (Signed)
Likely due to lack of dentures She has been getting EGD by her liver specialist, unlikely to be esophageal etiology

## 2022-09-01 NOTE — Assessment & Plan Note (Signed)
Well controlled with Detrol now Followed by Urology

## 2022-09-01 NOTE — Assessment & Plan Note (Signed)
Getting home PT Had back brace Pain meds PRN F/u with Dr Arnoldo Morale

## 2022-09-01 NOTE — Assessment & Plan Note (Signed)
On Cardizem Recently held Eliquis due to a fall Followed by cardiology and [facility] Advised to contact them for Eliquis Planned to get left atrial appendage occlusion In sinus rhythm currently

## 2022-09-01 NOTE — Patient Instructions (Signed)
Please continue taking medications as prescribed.  Please contact Dr Nelly Laurence office for restarting Eliquis.  Please get dental evaluation for denture evaluation.

## 2022-09-02 ENCOUNTER — Other Ambulatory Visit (HOSPITAL_COMMUNITY): Payer: Self-pay | Admitting: Specialist

## 2022-09-02 ENCOUNTER — Telehealth: Payer: Self-pay | Admitting: *Deleted

## 2022-09-02 DIAGNOSIS — K219 Gastro-esophageal reflux disease without esophagitis: Secondary | ICD-10-CM

## 2022-09-02 DIAGNOSIS — R1312 Dysphagia, oropharyngeal phase: Secondary | ICD-10-CM

## 2022-09-02 NOTE — Telephone Encounter (Signed)
Okay per JB - can schedule VV for Tuesday, 09/09/22 at 1:20 [place] office.

## 2022-09-02 NOTE — Telephone Encounter (Signed)
The patient verbally consented for a telehealth phone visit with [facility] and understands that his/her insurance company will be billed for the encounter.

## 2022-09-02 NOTE — Telephone Encounter (Signed)
FYI

## 2022-09-09 ENCOUNTER — Inpatient Hospital Stay: Payer: Medicare Other | Attending: Cardiology | Admitting: Cardiology

## 2022-09-09 ENCOUNTER — Telehealth: Payer: Self-pay

## 2022-09-09 ENCOUNTER — Encounter: Payer: Self-pay | Admitting: Cardiology

## 2022-09-09 VITALS — BP 104/58 | HR 71 | Temp 97.5°F | Ht 63.0 in | Wt 120.0 lb

## 2022-09-09 DIAGNOSIS — I4891 Unspecified atrial fibrillation: Secondary | ICD-10-CM | POA: Diagnosis not present

## 2022-09-09 DIAGNOSIS — D6869 Other thrombophilia: Secondary | ICD-10-CM | POA: Diagnosis not present

## 2022-09-09 DIAGNOSIS — Z79899 Other long term (current) drug therapy: Secondary | ICD-10-CM

## 2022-09-09 NOTE — Progress Notes (Signed)
Virtual Visit via Telephone Note   Because of Tasha Roberts's co-morbid illnesses, she is at least at moderate risk for complications without adequate follow up.  This format is felt to be most appropriate for this patient at this time.  The patient did not have access to video technology/had technical difficulties with video requiring transitioning to audio format only (telephone).  All issues noted in this document were discussed and addressed.  No physical exam could be performed with this format.  Please refer to the patient's chart for her consent to telehealth for [facility].    Date:  09/09/2022   ID:  Tasha Roberts, DOB 10/02/1941, MRN [id number] The patient was identified using 2 identifiers.  Patient Location: Home Provider Location: Office/Clinic   PCP:  Lindell Spar, [facility] Cardiologist:  Carlyle Dolly, MD     Evaluation Performed:  Follow-Up Visit  Chief Complaint:  Follow up visit  History of Present Illness:    Tasha Roberts is a 81 y.o. female seen today for follow up of the following medical problems. This is a focused follow for discussion about her afib and anticoagulaton.  Previously followed at [facility] cardiology   1. PAF - no recent palpitations.  - compliant on eliquis - chronic anemia, thrombocytopenia, liver disease, chronic heavy bruising. Despite has been able to remain on eliquis, most recent platelets >50k. Despite liver disease has not had evidence of varcies. Long history of anemia followed by hematology, intermittent iron infusions   -admit 07/2022 with fall and AMS. Chronic anemia noted during that admit, Hgb down to 6.8 and transfused 2 units. Eliquis was stopped due to fall - in discussion with daughter, reports only 2 falls over the last several years. One was a mechanical fall 4 months ago losing footing while stepping on trashcan pedal. The other was the recent 07/2022 which led to cervical fracture,  at that time she was at a nursing facility, also had AMS with UTI at the time -anemia has been chronic for her, considering watchman in the next few months.    2.Fall - admit 07/2022 to [facility] after fall and AMS, suffered cervical fractures.       Other medical issues not addressed this visit   2. Coronary calcifications -noted on prior CT imaging at [facility] - 08/2019 nuclear stress no ischemia   3. 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 HTN - compliant with meds   5. Hyperlipidemia 04/2020 TC 180 HDL 58 LDL 92 TG 148 - she is on statin     6. Sepsis secondary to UTI - admission 03/2022 with sepsis, UTI     7 LIver cirrhosis/NASH -    8. Anemia - followed by heme - intermittent iron infusions.  - Most recent EGD/colonoscopy (03/11/2022 at [facility]) showed portal hypertensive gastropathy with normal esophagus and duodenum.  Colonoscopy unremarkable. - Work-up for other sources of anemia was negative - normal SPEP, normal light chain ratio, normal haptoglobin, appropriately elevated erythropoietin - Most recent IV iron (Venofer 300 mg x 3, last on 01/17/2022) Past Medical History:  Diagnosis Date   Age related osteoporosis    Anemia due to blood loss    iron infusion's last one 03-25-2021 (pt bleeding post vulvar bx 02-11-2021, hospiatal admission 02-21-2021)   Anticoagulant long-term use    eliquis--- managed by  pcp    Arthritis    hands   Bronchiectasis [facility])    pulmonology --- dr Halford Chessman   (04-01-2021  pt stated no supplemental oxygen use since 01/ 2022 and no inhaler use,  stated checks O2 at home during the day 98-99%)   Cancer ([place]) 2013   Lumpectomy left breast   Cataract 2015   Chronic constipation    Clotting disorder ([place])    On eliquis   Coronary artery calcification    per lexiscan 08-17-2019 result in care everywhere   Depression, major, single  episode, moderate ([place]) 12/05/2021   DM II (diabetes mellitus, type II), controlled ([place]) 08/27/2005   DOE (dyspnea on exertion)    04-01-2021  per pt only going up stairs    Gastroesophageal reflux disease 12/05/2021   History of diabetes mellitus, type II    04-01-2021  per pt no issues after weight loss, last taken medication approx 2017   History of Graves' disease 2017   s/p RAI   History of left breast cancer    dx 2013,  s/p left partial mastecotmy w/ node dissection 09-15-2012, low grade DCIS, no chemo, completed radiation 02/ 2014,  per pt no recurrence   History of respiratory syncytial virus (RSV) infection 09/2020   w/ hypoxia  , hospital admission 10/ 2021  and acute exacerbation bronchiectasis;  follow up post hospital pulmonogy dr Halford Chessman  note in epic   Hyperlipidemia    Hypertension    followed by pcp   (pt had lexiscan in care everywhere dated 08-17-2019 no ischemia , non-obstructive extensive calcification, ef 69%)   Hypothyroidism    Hypothyroidism, postradioiodine therapy    followed by pcp   Liver cirrhosis secondary to NASH (nonalcoholic steatohepatitis) ([place])    followed by dr Merrilee Jansky ([facility])---- dx 2004,  compensated ,  last liver bx (03/ 2015) fibrosis stage 3;  moderate portal hypertensive gastrophy   PAF (paroxysmal atrial fibrillation) ([place]) previous cardiologist lov note w/ Suann Larry PA ([facility] in [place]) dated 01-24-2020 in care everywhere;  (04-01-2021 per pt has appt w/ new cardiologist , dr j. 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 kDelton Coombes  VIN III (vulvar intraepithelial neoplasia III)    Past Surgical History:  Procedure Laterality Date   [place]  BREAST SURGERY  09/15/2012   CATARACT EXTRACTION W/ INTRAOCULAR LENS  IMPLANT, BILATERAL  2016   CHOLECYSTECTOMY OPEN  18366  CO2 LASER APPLICATION N/A [id number]/[id number]/6546  Procedure: CO2 LASER APPLICATION OF THE VULVA;  Surgeon: REveritt Amber MD;  [place]  Service: Gynecology;  Laterality: N/A;   COLONOSCOPY WITH ESOPHAGOGASTRODUODENOSCOPY (EGD)  last one 08/ 2018  @[facility]    EYE SURGERY  2015   Cataract removed both eyes   LUMBAR LAMINECTOMY  2009  PARTIAL MASTECTOMY WITH AXILLARY SENTINEL LYMPH NODE BIOPSY Left 09-15-2012   [facility]   TUBAL LIGATION  08/08/1969     Current Meds  Medication Sig   atorvastatin (LIPITOR) 20 MG tablet TAKE (1) TABLET BY MOUTH ONCE DAILY. (Patient taking differently: Take 20 mg by mouth daily.)   Cholecalciferol 50 MCG (2000 UT) CAPS Take 1 capsule (2,000 Units total) by mouth daily.   clobetasol ointment (TEMOVATE) 0.05 % Apply topically at bedtime.   cyclobenzaprine (FLEXERIL) 10 MG tablet Take 1 tablet (10 mg total) by mouth 2 (two) times daily as needed for muscle spasms.   diltiazem (CARDIZEM CD) 120 MG 24 hr capsule TAKE (1) CAPSULE BY MOUTH ONCE DAILY.   docusate sodium (COLACE) 100 MG capsule Take 1 capsule (100 mg total) by mouth every 12 (twelve) hours.   feeding supplement (ENSURE ENLIVE / ENSURE PLUS) LIQD Take 237 mLs by mouth 2 (two) times daily between meals.   levothyroxine (SYNTHROID) 100 MCG tablet Take 1 tablet (100 mcg total) by mouth daily before breakfast.   lisinopril-hydrochlorothiazide (ZESTORETIC) 20-25 MG tablet Take 1 tablet by mouth daily.   nitroGLYCERIN  (NITROSTAT) 0.4 MG SL tablet Place 1 tablet (0.4 mg total) under the tongue every 5 (five) minutes as needed for chest pain.   ondansetron (ZOFRAN) 4 MG tablet Take 1 tablet (4 mg total) by mouth every 8 (eight) hours as needed for nausea or vomiting.   oxyCODONE (OXY IR/ROXICODONE) 5 MG immediate release tablet Take 1 tablet (5 mg total) by mouth every 4 (four) hours as needed for severe pain.   polyethylene glycol (MIRALAX / GLYCOLAX) 17 g packet Take 17 g by mouth daily as needed for moderate constipation.   tolterodine (DETROL LA) 4 MG 24 hr capsule Take 1 capsule (4 mg total) by mouth daily.   vitamin B-12 (CYANOCOBALAMIN) 500 MCG tablet Take 500 mcg by mouth daily.   Current Facility-Administered Medications for the 09/09/22 encounter (Office Visit) with Arnoldo Lenis, MD  Medication   0.9 %  sodium chloride infusion   0.9 %  sodium chloride infusion     Allergies:   Dicyclomine, Glucophage [metformin], and Losartan   Social History   Tobacco Use   Smoking status: Never   Smokeless tobacco: Never  Vaping Use   Vaping Use: Never used  Substance Use Topics   Alcohol use: Not Currently   Drug use: Never     Family Hx: The patient's family history includes Dementia in her mother; Hearing loss in her father; Heart disease in her brother and father; Hypertension in her mother; Stroke in her maternal grandfather; Vision loss in her mother.  ROS:   Please see the history of present illness.    All other systems reviewed and are negative.   Prior CV studies:   The following studies were reviewed today:  Jan 2021 echo [place] Summary   1. Overall left ventricular ejection fraction is estimated at 60 to 65%.   2. Normal global left ventricular systolic function.   3. (Grade 2) Moderately abnormal left ventricular diastolic filling.   4. Mild concentric left ventricular hypertrophy.   5. Mildly dilated left atrium.   6. Mild mitral annular calcification.   7. Moderately  elevated pulmonary artery systolic pressure.   8. No prior echo.    08/2019 nuclear stress 1.  Normal myocardial perfusion scan.  2.  Normal left ventricular systolic function.  3.  Dense coronary calcification as described.  4.  Prominent ascending  aorta.   Labs/Other Tests and Data Reviewed:    EKG:  No ECG reviewed.  Recent Labs: 07/25/2022: TSH 4.145 07/26/2022: ALT 40; Magnesium 1.9 07/27/2022: Hemoglobin 8.5; Platelets 125 07/28/2022: BUN 17; Creatinine, Ser 0.92; Potassium 3.7; Sodium 137   Recent Lipid Panel No results found for: "CHOL", "TRIG", "HDL", "CHOLHDL", "LDLCALC", "LDLDIRECT"  Wt Readings from Last 3 Encounters:  09/09/22 120 lb (54.4 kg)  09/01/22 120 lb 6.4 oz (54.6 kg)  07/25/22 131 lb 9.8 oz (59.7 kg)     Risk Assessment/Calculations:          Objective:    Vital Signs:  Ht 5' 3"  (1.6 m)   Wt 120 lb (54.4 kg)   BMI 21.26 kg/m    Discussion is with patient's daughter as patient is working with OT at time of our conversation.   ASSESSMENT & PLAN:    1. PAF/acquired thrombophilia -off anticoag after recent admission with fall - daughter reports only 2 falls over the last several years. First was mechanical, most recent occurred at nursing home complicated by AMS with UTI.  - she has chronic anemia, most recent transfusion during 07/2022 admission. Being considered for watchman device, has had some prior issues with low platelets - recheck cbc. Falls are not regular for her, I would restart eliquis 70m bid pending Hgb and platelets. Ultimately she is undergoing watchman device evaluation with upcoming appt and may be able to get off anticoag in the near future.         Time:   Today, I have spent 15 minutes with the patient with telehealth technology discussing the above problems.     Medication Adjustments/Labs and Tests Ordered: Current medicines are reviewed at length with the patient today.  Concerns regarding medicines are outlined above.    Tests Ordered: No orders of the defined types were placed in this encounter.   Medication Changes: No orders of the defined types were placed in this encounter.   Follow Up:  4 months  Signed, BCarlyle Dolly MD  09/09/2022 11:59 AM    [place]

## 2022-09-09 NOTE — Patient Instructions (Addendum)
Medication Instructions:    Your physician recommends that you continue on your current medications as directed. Please refer to the Current Medication list given to you today.   Labwork:  CBC at [facility]  Testing/Procedures: None today  Follow-Up:  12/23/2022 at 1 pm with Cadence Kathlen Mody, PA-C  Any Other Special Instructions Will Be Listed Below (If Applicable).  If you need a refill on your cardiac medications before your next appointment, please call your pharmacy.

## 2022-09-09 NOTE — Telephone Encounter (Signed)
  Patient Consent for Virtual Visit        Tasha Roberts has provided verbal consent on 09/09/2022 for a virtual visit (video or telephone).   CONSENT FOR VIRTUAL VISIT FOR:  Tasha Roberts  By participating in this virtual visit I agree to the following:  I hereby voluntarily request, consent and authorize [facility] and its employed or contracted physicians, physician assistants, nurse practitioners or other licensed health care professionals (the Practitioner), to provide me with telemedicine health care services (the "Services") as deemed necessary by the treating Practitioner. I acknowledge and consent to receive the Services by the Practitioner via telemedicine. I understand that the telemedicine visit will involve communicating with the Practitioner through live audiovisual communication technology and the disclosure of certain medical information by electronic transmission. I acknowledge that I have been given the opportunity to request an in-person assessment or other available alternative prior to the telemedicine visit and am voluntarily participating in the telemedicine visit.  I understand that I have the right to withhold or withdraw my consent to the use of telemedicine in the course of my care at any time, without affecting my right to future care or treatment, and that the Practitioner or I may terminate the telemedicine visit at any time. I understand that I have the right to inspect all information obtained and/or recorded in the course of the telemedicine visit and may receive copies of available information for a reasonable fee.  I understand that some of the potential risks of receiving the Services via telemedicine include:  Delay or interruption in medical evaluation due to technological equipment failure or disruption; Information transmitted may not be sufficient (e.g. poor resolution of images) to allow for appropriate medical decision making by the Practitioner;  and/or  In rare instances, security protocols could fail, causing a breach of personal health information.  Furthermore, I acknowledge that it is my responsibility to provide information about my medical history, conditions and care that is complete and accurate to the best of my ability. I acknowledge that Practitioner's advice, recommendations, and/or decision may be based on factors not within their control, such as incomplete or inaccurate data provided by me or distortions of diagnostic images or specimens that may result from electronic transmissions. I understand that the practice of medicine is not an exact science and that Practitioner makes no warranties or guarantees regarding treatment outcomes. I acknowledge that a copy of this consent can be made available to me via my patient portal ([facility]), or I can request a printed copy by calling the office of [facility].    I understand that my insurance will be billed for this visit.   I have read or had this consent read to me. I understand the contents of this consent, which adequately explains the benefits and risks of the Services being provided via telemedicine.  I have been provided ample opportunity to ask questions regarding this consent and the Services and have had my questions answered to my satisfaction. I give my informed consent for the services to be provided through the use of telemedicine in my medical care

## 2022-09-11 ENCOUNTER — Other Ambulatory Visit (HOSPITAL_COMMUNITY)
Admission: RE | Admit: 2022-09-11 | Discharge: 2022-09-11 | Disposition: A | Discharge status: Home / Self Care | Payer: Medicare Other | Source: Ambulatory Visit | Attending: Family Medicine | Admitting: Family Medicine

## 2022-09-11 ENCOUNTER — Encounter: Payer: Self-pay | Admitting: Physician Assistant

## 2022-09-11 ENCOUNTER — Other Ambulatory Visit (HOSPITAL_COMMUNITY)
Admission: RE | Admit: 2022-09-11 | Discharge: 2022-09-11 | Disposition: A | Discharge status: Home / Self Care | Payer: Medicare Other | Source: Ambulatory Visit | Attending: Cardiology | Admitting: Cardiology

## 2022-09-11 DIAGNOSIS — K7469 Other cirrhosis of liver: Secondary | ICD-10-CM | POA: Diagnosis present

## 2022-09-11 DIAGNOSIS — Z79899 Other long term (current) drug therapy: Secondary | ICD-10-CM | POA: Insufficient documentation

## 2022-09-11 LAB — COMPREHENSIVE METABOLIC PANEL
ALT: 23 U/L (ref 0–44)
AST: 30 U/L (ref 15–41)
Albumin: 3.1 g/dL — ABNORMAL LOW (ref 3.5–5.0)
Alkaline Phosphatase: 126 U/L (ref 38–126)
Anion gap: 10 (ref 5–15)
BUN: 20 mg/dL (ref 8–23)
CO2: 25 mmol/L (ref 22–32)
Calcium: 8.8 mg/dL — ABNORMAL LOW (ref 8.9–10.3)
Chloride: 106 mmol/L (ref 98–111)
Creatinine, Ser: 1.08 mg/dL — ABNORMAL HIGH (ref 0.44–1.00)
GFR, Estimated: 52 mL/min — ABNORMAL LOW (ref >60)
Glucose, Bld: 94 mg/dL (ref 70–99)
Potassium: 3.5 mmol/L (ref 3.5–5.1)
Sodium: 141 mmol/L (ref 135–145)
Total Bilirubin: 1.4 mg/dL — ABNORMAL HIGH (ref 0.3–1.2)
Total Protein: 6 g/dL — ABNORMAL LOW (ref 6.5–8.1)

## 2022-09-11 LAB — CBC
HCT: 40.1 % (ref 36.0–46.0)
Hemoglobin: 12.9 g/dL (ref 12.0–15.0)
MCH: 30.1 pg (ref 26.0–34.0)
MCHC: 32.2 g/dL (ref 30.0–36.0)
MCV: 93.7 fL (ref 80.0–100.0)
Platelets: 119 10*3/uL — ABNORMAL LOW (ref 150–400)
RBC: 4.28 MIL/uL (ref 3.87–5.11)
RDW: 15.7 % — ABNORMAL HIGH (ref 11.5–15.5)
WBC: 6.1 10*3/uL (ref 4.0–10.5)
nRBC: 0 % (ref 0.0–0.2)

## 2022-09-12 ENCOUNTER — Other Ambulatory Visit: Payer: Self-pay | Admitting: Cardiology

## 2022-09-12 ENCOUNTER — Ambulatory Visit: Payer: Medicare Other | Attending: Cardiology

## 2022-09-12 ENCOUNTER — Telehealth: Payer: Self-pay | Admitting: Cardiology

## 2022-09-12 DIAGNOSIS — I4891 Unspecified atrial fibrillation: Secondary | ICD-10-CM

## 2022-09-12 MED ORDER — APIXABAN 5 MG PO TABS
5.0000 mg | ORAL_TABLET | Freq: Two times a day (BID) | ORAL | 5 refills | Status: DC
Start: 2022-09-12 — End: 2022-10-20

## 2022-09-12 NOTE — Telephone Encounter (Signed)
Labs look good, can restart eliquis 73m bid. Heart rates had looked ok from recent other checks, unclear how often they may be elevated. Can she get a 7 day zio patch please for afib  JZandra AbtsMD

## 2022-09-12 NOTE — Telephone Encounter (Signed)
Patient c/o Palpitations:  High priority if patient c/o lightheadedness, shortness of breath, or chest pain  How long have you had palpitations/irregular HR/ Afib? Are you having the symptoms now?   Are you currently experiencing lightheadedness, SOB or CP? No   Do you have a history of afib (atrial fibrillation) or irregular heart rhythm? Yes   Have you checked your BP or HR? (document readings if available): 120-144  Are you experiencing any other symptoms? Non   Annette PT with [facility] calling, she said, she's at the pt house to do PT but pt's HR is 120-144 at rest. She said, pt don't have any symptoms or complaints but she will be leaving soon and can call pt directly for recommendation

## 2022-09-12 NOTE — Telephone Encounter (Signed)
Patient notified and verbalized agreement. Patient asked for zio to be mailed to her home.

## 2022-09-12 NOTE — Telephone Encounter (Signed)
Spoke to pt who stated that she did not know her hr was elevated until PT made her aware. Pt stated that she is not having any symptoms and feels fine. Pt stated that she had lab work yesterday, ordered by provider, to determine if restarting Eliquis was the right option for her. I will fwd to provider for review/recommendations.   Please Advise.

## 2022-09-13 LAB — AFP TUMOR MARKER: AFP, Serum, Tumor Marker: 1.9 ng/mL (ref 0.0–9.2)

## 2022-09-15 ENCOUNTER — Inpatient Hospital Stay: Payer: Medicare Other

## 2022-09-16 ENCOUNTER — Other Ambulatory Visit (HOSPITAL_COMMUNITY): Payer: Medicare Other

## 2022-09-16 ENCOUNTER — Ambulatory Visit (HOSPITAL_COMMUNITY): Payer: Medicare Other | Admitting: Speech Pathology

## 2022-09-22 ENCOUNTER — Inpatient Hospital Stay: Payer: Medicare Other

## 2022-09-24 ENCOUNTER — Telehealth: Payer: Self-pay | Admitting: Cardiology

## 2022-09-24 ENCOUNTER — Ambulatory Visit: Payer: Medicare Other | Admitting: Cardiovascular Disease

## 2022-09-24 NOTE — Telephone Encounter (Signed)
   Pre-operative Risk Assessment    Patient Name: Tasha Roberts  DOB: [birth year] MRN: [id number]      Request for Surgical Clearance    Procedure:   Lumbar Spine - L1-L2 ES1  Date of Surgery:  Clearance 10/07/22                                 Surgeon:  Dr. Lenord Carbo Surgeon's Group or Practice Name:  [facility] Phone number:  [phone] Fax number:  [phone]   Type of Clearance Requested:   - Pharmacy:  Hold Apixaban (Eliquis) Discontinue Eliquis for 3 days prior. The patient can resume the blood thinner: day after.   Type of Anesthesia:  Not Indicated   Additional requests/questions:    Signed, Hipolito Bayley   09/24/2022, 8:06 AM

## 2022-09-24 NOTE — Telephone Encounter (Signed)
   Patient Name: Tasha Roberts  DOB: [birth year] MRN: [id number]  Primary Cardiologist: Carlyle Dolly, MD  Clinical pharmacists have reviewed the patient's past medical history, labs, and current medications as part of preoperative protocol coverage. The following recommendations have been made:  Patient with diagnosis of A Fib on Eliquis for anticoagulation.     Procedure:  Lumbar Spine - L1-L2 ES1 Date of procedure: 10/07/22     CHA2DS2-VASc Score = 6  This indicates a 9.7% annual risk of stroke. The patient's score is based upon: CHF History: 0 HTN History: 1 Diabetes History: 1 Stroke History: 0 Vascular Disease History: 1 Age Score: 2 Gender Score: 1     CrCl 36 mL/min Platelet count 119K   Per office protocol, patient can hold Eliquis for 3 days prior to procedure.  Please resume Eliquis as soon as possible postprocedure, at the discretion of the surgeon.   I will route this recommendation to the requesting party via Epic fax function and remove from pre-op pool.  Please call with questions.  Lenna Sciara, NP 09/24/2022, 3:42 PM

## 2022-09-24 NOTE — Telephone Encounter (Signed)
Patient with diagnosis of A Fib on Eliquis for anticoagulation.    Procedure:  Lumbar Spine - L1-L2 ES1 Date of procedure: 10/07/22   CHA2DS2-VASc Score = 6  This indicates a 9.7% annual risk of stroke. The patient's score is based upon: CHF History: 0 HTN History: 1 Diabetes History: 1 Stroke History: 0 Vascular Disease History: 1 Age Score: 2 Gender Score: 1    CrCl 36 mL/min Platelet count 119K  Per office protocol, patient can hold Eliquis for 3 days prior to procedure.    **This guidance is not considered finalized until pre-operative APP has relayed final recommendations.**

## 2022-09-26 ENCOUNTER — Inpatient Hospital Stay: Payer: Medicare Other

## 2022-09-29 ENCOUNTER — Emergency Department (HOSPITAL_COMMUNITY): Payer: Medicare Other

## 2022-09-29 ENCOUNTER — Encounter (HOSPITAL_COMMUNITY): Payer: Self-pay

## 2022-09-29 ENCOUNTER — Inpatient Hospital Stay: Payer: Medicare Other | Attending: Hematology

## 2022-09-29 ENCOUNTER — Emergency Department (HOSPITAL_COMMUNITY)
Admission: EM | Admit: 2022-09-29 | Discharge: 2022-09-29 | Disposition: A | Discharge status: Home / Self Care | Payer: Medicare Other | Attending: Emergency Medicine | Admitting: Emergency Medicine

## 2022-09-29 ENCOUNTER — Other Ambulatory Visit: Payer: Self-pay

## 2022-09-29 DIAGNOSIS — R928 Other abnormal and inconclusive findings on diagnostic imaging of breast: Secondary | ICD-10-CM | POA: Diagnosis not present

## 2022-09-29 DIAGNOSIS — R627 Adult failure to thrive: Secondary | ICD-10-CM | POA: Diagnosis not present

## 2022-09-29 DIAGNOSIS — I959 Hypotension, unspecified: Secondary | ICD-10-CM | POA: Insufficient documentation

## 2022-09-29 DIAGNOSIS — Z853 Personal history of malignant neoplasm of breast: Secondary | ICD-10-CM | POA: Insufficient documentation

## 2022-09-29 DIAGNOSIS — Z7901 Long term (current) use of anticoagulants: Secondary | ICD-10-CM | POA: Diagnosis not present

## 2022-09-29 DIAGNOSIS — N179 Acute kidney failure, unspecified: Secondary | ICD-10-CM | POA: Insufficient documentation

## 2022-09-29 DIAGNOSIS — R531 Weakness: Secondary | ICD-10-CM | POA: Diagnosis present

## 2022-09-29 DIAGNOSIS — R630 Anorexia: Secondary | ICD-10-CM | POA: Diagnosis not present

## 2022-09-29 DIAGNOSIS — R770 Abnormality of albumin: Secondary | ICD-10-CM | POA: Diagnosis not present

## 2022-09-29 DIAGNOSIS — E860 Dehydration: Secondary | ICD-10-CM | POA: Diagnosis not present

## 2022-09-29 DIAGNOSIS — E8809 Other disorders of plasma-protein metabolism, not elsewhere classified: Secondary | ICD-10-CM

## 2022-09-29 LAB — CBC WITH DIFFERENTIAL/PLATELET
Abs Immature Granulocytes: 0.02 10*3/uL (ref 0.00–0.07)
Basophils Absolute: 0 10*3/uL (ref 0.0–0.1)
Basophils Relative: 0 %
Eosinophils Absolute: 0.2 10*3/uL (ref 0.0–0.5)
Eosinophils Relative: 2 %
HCT: 38.2 % (ref 36.0–46.0)
Hemoglobin: 12.3 g/dL (ref 12.0–15.0)
Immature Granulocytes: 0 %
Lymphocytes Relative: 18 %
Lymphs Abs: 1.3 10*3/uL (ref 0.7–4.0)
MCH: 30 pg (ref 26.0–34.0)
MCHC: 32.2 g/dL (ref 30.0–36.0)
MCV: 93.2 fL (ref 80.0–100.0)
Monocytes Absolute: 0.7 10*3/uL (ref 0.1–1.0)
Monocytes Relative: 10 %
Neutro Abs: 5.1 10*3/uL (ref 1.7–7.7)
Neutrophils Relative %: 70 %
Platelets: 159 10*3/uL (ref 150–400)
RBC: 4.1 MIL/uL (ref 3.87–5.11)
RDW: 15.1 % (ref 11.5–15.5)
WBC: 7.4 10*3/uL (ref 4.0–10.5)
nRBC: 0 % (ref 0.0–0.2)

## 2022-09-29 LAB — COMPREHENSIVE METABOLIC PANEL
ALT: 22 U/L (ref 0–44)
AST: 33 U/L (ref 15–41)
Albumin: 2.6 g/dL — ABNORMAL LOW (ref 3.5–5.0)
Alkaline Phosphatase: 126 U/L (ref 38–126)
Anion gap: 9 (ref 5–15)
BUN: 28 mg/dL — ABNORMAL HIGH (ref 8–23)
CO2: 23 mmol/L (ref 22–32)
Calcium: 9.1 mg/dL (ref 8.9–10.3)
Chloride: 107 mmol/L (ref 98–111)
Creatinine, Ser: 1.74 mg/dL — ABNORMAL HIGH (ref 0.44–1.00)
GFR, Estimated: 29 mL/min — ABNORMAL LOW (ref >60)
Glucose, Bld: 94 mg/dL (ref 70–99)
Potassium: 4.3 mmol/L (ref 3.5–5.1)
Sodium: 139 mmol/L (ref 135–145)
Total Bilirubin: 1.1 mg/dL (ref 0.3–1.2)
Total Protein: 5.3 g/dL — ABNORMAL LOW (ref 6.5–8.1)

## 2022-09-29 LAB — TROPONIN I (HIGH SENSITIVITY)
Troponin I (High Sensitivity): 19 ng/L — ABNORMAL HIGH (ref <18)
Troponin I (High Sensitivity): 16 ng/L (ref <18)

## 2022-09-29 LAB — PROTIME-INR
INR: 2.7 — ABNORMAL HIGH (ref 0.8–1.2)
Prothrombin Time: 28.1 seconds — ABNORMAL HIGH (ref 11.4–15.2)

## 2022-09-29 LAB — MAGNESIUM: Magnesium: 2 mg/dL (ref 1.7–2.4)

## 2022-09-29 LAB — D-DIMER, QUANTITATIVE: D-Dimer, Quant: 0.65 ug/mL-FEU — ABNORMAL HIGH (ref 0.00–0.50)

## 2022-09-29 LAB — LIPASE, BLOOD: Lipase: 26 U/L (ref 11–51)

## 2022-09-29 LAB — CBG MONITORING, ED: Glucose-Capillary: 73 mg/dL (ref 70–99)

## 2022-09-29 MED ORDER — SODIUM CHLORIDE 0.9 % IV BOLUS
500.0000 mL | Freq: Once | INTRAVENOUS | Status: AC
  Administered 2022-09-29: 500 mL via INTRAVENOUS

## 2022-09-29 MED ORDER — SODIUM CHLORIDE 0.9 % IV BOLUS
1000.0000 mL | Freq: Once | INTRAVENOUS | Status: AC
  Administered 2022-09-29: 1000 mL via INTRAVENOUS

## 2022-09-29 NOTE — Progress Notes (Signed)
Patient presents today for Venofer infusion, BP on admission 74/51, 75/50 and 76/45. Patient denies dizziness but does state she has been weak, patient has hx of A-fib. Dr. Benay Spice made aware, advised to hold venofer today and have patient evaluated in ED.  ED charge nurse called for report and advised pt to check in at registration. Patient wheeled to ED via wheelchair by [facility] charge nurse.

## 2022-09-29 NOTE — ED Provider Notes (Signed)
[facility] EMERGENCY DEPARTMENT Provider Note   CSN: [id number] Arrival date & time: 09/29/22  1003     History  Chief Complaint  Patient presents with   Hypotension    Tasha Roberts is a 81 y.o. female.  Pt is a 81 yo female presenting from transfusion center for hypotension. Pt admits to a pmh of left sided breast cancer dx in 2013 with lumpectomy and lymphnode removal at that time. Pt states she follows with her pcp Dr. Posey Pronto who gets yearly mammograms that have all been normal up until this past 07/10/22 that was concerning for In the right breast , calcifications requires further evaluation. In the left breast , a possible mass requires further evaluation. This possible mass is seen within the retroareolar LEFT breast, cc views only, cc 1/2 slice 42 and cc 2/2 slice 43. Pt admits to 19 lb weight loss in the past two months, decreased appetite, and generalized weakness. Pt was at transfusion center today presenting for her first iron transfusion for iron deficiency anemia when she had blood pressure recording of 77/45 and sent to the ED.   Denies any infectious s/s including no fevers, chills, nausea, vomiting, abdominal pain, diarrhea, coughing, or dysuria.   Denies black or bloody stools. Denies hematuria.   The history is provided by the patient. No language interpreter was used.       Home Medications Prior to Admission medications   Medication Sig Start Date End Date Taking? Authorizing Provider  apixaban (ELIQUIS) 5 MG TABS tablet Take 1 tablet (5 mg total) by mouth 2 (two) times daily. 09/12/22  Yes Branch, Alphonse Guild, MD  atorvastatin (LIPITOR) 20 MG tablet TAKE (1) TABLET BY MOUTH ONCE DAILY. Patient taking differently: Take 20 mg by mouth daily. 03/05/22  Yes Lindell Spar, MD  Cholecalciferol 50 MCG (2000 UT) CAPS Take 1 capsule (2,000 Units total) by mouth daily. 03/30/22  Yes Antonieta Pert, MD  cyclobenzaprine (FLEXERIL) 10 MG tablet Take 1 tablet (10 mg total) by mouth  2 (two) times daily as needed for muscle spasms. 07/12/22  Yes Raul Del, Conner M, PA-C  diltiazem (CARDIZEM CD) 120 MG 24 hr capsule TAKE (1) CAPSULE BY MOUTH ONCE DAILY. 05/16/22  Yes Branch, Alphonse Guild, MD  docusate sodium (COLACE) 100 MG capsule Take 1 capsule (100 mg total) by mouth every 12 (twelve) hours. 08/11/22  Yes Pollina, Gwenyth Allegra, MD  levothyroxine (SYNTHROID) 100 MCG tablet Take 1 tablet (100 mcg total) by mouth daily before breakfast. 06/09/22  Yes Lindell Spar, MD  lisinopril-hydrochlorothiazide (ZESTORETIC) 20-25 MG tablet Take 1 tablet by mouth daily.   Yes [provider]  nitroGLYCERIN (NITROSTAT) 0.4 MG SL tablet Place 1 tablet (0.4 mg total) under the tongue every 5 (five) minutes as needed for chest pain. 07/31/21  Yes Lindell Spar, MD  ondansetron (ZOFRAN) 4 MG tablet Take 1 tablet (4 mg total) by mouth every 8 (eight) hours as needed for nausea or vomiting. 09/01/22  Yes Lindell Spar, MD  oxyCODONE (OXY IR/ROXICODONE) 5 MG immediate release tablet Take 1 tablet (5 mg total) by mouth every 4 (four) hours as needed for severe pain. 07/28/22  Yes Shah, Pratik D, DO  tolterodine (DETROL LA) 4 MG 24 hr capsule Take 1 capsule (4 mg total) by mouth daily. 08/28/22  Yes McKenzie, Candee Furbish, MD  vitamin B-12 (CYANOCOBALAMIN) 500 MCG tablet Take 500 mcg by mouth daily.   Yes [provider]  amoxicillin-clavulanate (AUGMENTIN) 875-125 MG tablet  Take 1 tablet by mouth every 12 (twelve) hours. 10/05/22   Milton Ferguson, MD  feeding supplement (ENSURE ENLIVE / ENSURE PLUS) LIQD Take 237 mLs by mouth 2 (two) times daily between meals. Patient not taking: Reported on 09/29/2022 07/28/22   Heath Lark D, DO  ondansetron (ZOFRAN-ODT) 4 MG disintegrating tablet 41m ODT q4 hours prn nausea/vomit 10/05/22   ZMilton Ferguson MD  polyethylene glycol (MIRALAX / GLYCOLAX) 17 g packet Take 17 g by mouth daily as needed for moderate constipation. Patient not taking: Reported on  09/29/2022 08/11/22   POrpah Greek MD      Allergies    Dicyclomine, Glucophage [metformin], and Losartan    Review of Systems   Review of Systems  Constitutional:  Positive for fatigue and unexpected weight change. Negative for chills and fever.  HENT:  Negative for ear pain and sore throat.   Eyes:  Negative for pain and visual disturbance.  Respiratory:  Negative for cough and shortness of breath.   Cardiovascular:  Negative for chest pain and palpitations.  Gastrointestinal:  Negative for abdominal pain and vomiting.  Genitourinary:  Negative for dysuria and hematuria.  Musculoskeletal:  Negative for arthralgias and back pain.  Skin:  Negative for color change and rash.  Neurological:  Positive for weakness. Negative for seizures and syncope.  All other systems reviewed and are negative.   Physical Exam Updated Vital Signs BP (!) 119/57 (BP Location: Right Arm)   Pulse 81   Temp 97.9 F (36.6 C) (Oral)   Resp 18   Ht 5' 3"  (1.6 m)   Wt 54.4 kg   SpO2 98%   BMI 21.26 kg/m  Physical Exam Vitals and nursing note reviewed.  Constitutional:      General: She is not in acute distress.    Appearance: She is well-developed.  HENT:     Head: Normocephalic and atraumatic.  Eyes:     Conjunctiva/sclera: Conjunctivae normal.  Cardiovascular:     Rate and Rhythm: Normal rate and regular rhythm.     Heart sounds: No murmur heard. Pulmonary:     Effort: Pulmonary effort is normal. No respiratory distress.     Breath sounds: Normal breath sounds.  Abdominal:     Palpations: Abdomen is soft.     Tenderness: There is no abdominal tenderness.  Musculoskeletal:        General: No swelling.     Cervical back: Neck supple.  Skin:    General: Skin is warm and dry.     Capillary Refill: Capillary refill takes less than 2 seconds.  Neurological:     Mental Status: She is alert.  Psychiatric:        Mood and Affect: Mood normal.     ED Results / Procedures /  Treatments   Labs (all labs ordered are listed, but only abnormal results are displayed) Labs Reviewed  COMPREHENSIVE METABOLIC PANEL - Abnormal; Notable for the following components:      Result Value   BUN 28 (*)    Creatinine, Ser 1.74 (*)    Total Protein 5.3 (*)    Albumin 2.6 (*)    GFR, Estimated 29 (*)    All other components within normal limits  D-DIMER, QUANTITATIVE - Abnormal; Notable for the following components:   D-Dimer, Quant 0.65 (*)    All other components within normal limits  PROTIME-INR - Abnormal; Notable for the following components:   Prothrombin Time 28.1 (*)    INR 2.7 (*)  All other components within normal limits  TROPONIN I (HIGH SENSITIVITY) - Abnormal; Notable for the following components:   Troponin I (High Sensitivity) 19 (*)    All other components within normal limits  CBC WITH DIFFERENTIAL/PLATELET  LIPASE, BLOOD  MAGNESIUM  CBG MONITORING, ED  TROPONIN I (HIGH SENSITIVITY)    EKG None  Radiology No results found.  Procedures Procedures    Medications Ordered in ED Medications  sodium chloride 0.9 % bolus 500 mL (0 mLs Intravenous Stopped 09/29/22 1234)  sodium chloride 0.9 % bolus 1,000 mL (0 mLs Intravenous Stopped 09/29/22 1527)    ED Course/ Medical Decision Making/ A&P                           Medical Decision Making Amount and/or Complexity of Data Reviewed Labs: ordered. Radiology: ordered.   8:33 AM 81 yo female presenting from transfusion center for hypotension.  She is alert and oriented x3, no acute distress, afebrile, so vital signs.    Patient reports a history of decreased appetite, 19 pound weight loss, and recent abnormal mammogram.  Given this findings I am concerned for recurrent breast cancer.  Patient has a scheduled follow-up ultrasound already established by her primary care physician.  On exam she is thin and clinically dehydrated.  On arrival patient had a blood pressure of 98/54.  She was given  IV fluid bolus which improved her blood pressure to 119/57.  Hypotension likely secondary to dehydration.    Laboratory studies otherwise unremarkable.  Stable electrolytes.  Stable cardiac work-up.  Including EKG and troponins.  Patient also has a history of anemia.  Currently stable hemoglobin.   No hypoglycemia.  No hypoxia.  Signs or symptoms of infection or sepsis.  Patient's been evaluated emergency department for over 3 hours today.  Her blood pressure is wrist remained stable after IV fluids.  She is safe for discharge at this time.  I called her daughter to encourage the family to get the ultrasound in a timely manner due to concerns for recurrent cancer.  The patient and the daughter both understand and agree to follow-up with primary care physician within the next week for monitoring of blood pressure.  Patient in no distress and overall condition improved here in the ED. Detailed discussions were had with the patient regarding current findings, and need for close f/u with PCP or on call doctor. The patient has been instructed to return immediately if the symptoms worsen in any way for re-evaluation. Patient verbalized understanding and is in agreement with current care plan. All questions answered prior to discharge.        Final Clinical Impression(s) / ED Diagnoses Final diagnoses:  AKI (acute kidney injury) ([place])  Dehydration  Hypoalbuminemia  Failure to thrive in adult  Decreased appetite  Abnormal mammogram    Rx / DC Orders ED Discharge Orders     None         Lianne Cure, DO [birth year] [phone]

## 2022-09-29 NOTE — Discharge Instructions (Addendum)
Please follow closely with your primary care physician regarding breast ultrasound. This is very important since your September mammogram was abnormal. Make sure you are drinking plenty of fluids to help prevent dehydration.

## 2022-09-29 NOTE — ED Notes (Signed)
Patient unable to sign for MSE due to pen pad issues. Patient verbalized MSE.

## 2022-09-29 NOTE — ED Triage Notes (Signed)
Patient states she was at the cancer center and her BP was 77/45 and patient was there for iron infusion. Patient hx of left breast cancer. Patient states increased weakness for a couple of months.

## 2022-09-30 ENCOUNTER — Telehealth: Payer: Self-pay | Admitting: Internal Medicine

## 2022-09-30 ENCOUNTER — Inpatient Hospital Stay: Payer: Medicare Other

## 2022-09-30 ENCOUNTER — Telehealth: Payer: Self-pay

## 2022-09-30 NOTE — Telephone Encounter (Signed)
The patient was evaluated by Dr. Burt Knack for Watchman device 05/09/22. Cardiac CT was completed and she was scheduled for implant 9/14. She called in August to postpone the procedure to 10/23/2022 due to falling and fracturing her back. She was scheduled with Dr. Burt Knack for pre-procedure check-up and visit. She has had several hospitalizations between August and now and has rescheduled her visit with Dr. Burt Knack several times.  Called the patient today to confirm appointment with Dr. Burt Knack tomorrow.  At this time, she states she is just tired. She was seen in the ER yesterday for dehydration and she is "just exhausted." The patient wishes to cancel appointment with Dr. Burt Knack tomorrow and tentative Watchman procedure in November. She does not want to reschedule.  After long discussion with Ms. Shaker and her daughter, I gave her my direct phone number and instructed her to call me if/when she wants to discuss Watchman again. Appointment tomorrow and Watchman procedure cancelled.   Confirmed Cardiology follow-up in January. They were grateful for assistance.

## 2022-09-30 NOTE — Telephone Encounter (Signed)
Spoke with patient [name] as this was the number that was left on note  DO NOT [place]  advised pt daughter to let me know when he comes back out or provide me with a number to reach him to give verbal orders

## 2022-09-30 NOTE — Telephone Encounter (Signed)
Marcella Dubs Physical Therapist w. [facility] called on patient behalf,  Did not get to see patient last week , patient and family were under the weather.  Looking for verbal orders to see patient 2x this week, with second visit being for re certification to see patient longer.   Call back [phone]

## 2022-10-01 ENCOUNTER — Ambulatory Visit: Payer: Medicare Other | Admitting: Cardiovascular Disease

## 2022-10-01 NOTE — Telephone Encounter (Signed)
Marcella Dubs number provided in previous message.

## 2022-10-01 NOTE — Telephone Encounter (Signed)
Verbal orders given  

## 2022-10-03 ENCOUNTER — Inpatient Hospital Stay: Payer: Medicare Other

## 2022-10-05 ENCOUNTER — Other Ambulatory Visit: Payer: Self-pay

## 2022-10-05 ENCOUNTER — Encounter (HOSPITAL_COMMUNITY): Payer: Self-pay

## 2022-10-05 ENCOUNTER — Emergency Department (HOSPITAL_COMMUNITY)
Admission: EM | Admit: 2022-10-05 | Discharge: 2022-10-05 | Disposition: A | Discharge status: Home / Self Care | Payer: Medicare Other | Attending: Emergency Medicine | Admitting: Emergency Medicine

## 2022-10-05 ENCOUNTER — Emergency Department (HOSPITAL_COMMUNITY): Payer: Medicare Other

## 2022-10-05 DIAGNOSIS — Z79899 Other long term (current) drug therapy: Secondary | ICD-10-CM | POA: Insufficient documentation

## 2022-10-05 DIAGNOSIS — Z7901 Long term (current) use of anticoagulants: Secondary | ICD-10-CM | POA: Insufficient documentation

## 2022-10-05 DIAGNOSIS — R112 Nausea with vomiting, unspecified: Secondary | ICD-10-CM | POA: Diagnosis present

## 2022-10-05 DIAGNOSIS — I10 Essential (primary) hypertension: Secondary | ICD-10-CM | POA: Insufficient documentation

## 2022-10-05 DIAGNOSIS — Z20822 Contact with and (suspected) exposure to covid-19: Secondary | ICD-10-CM | POA: Insufficient documentation

## 2022-10-05 DIAGNOSIS — K529 Noninfective gastroenteritis and colitis, unspecified: Secondary | ICD-10-CM | POA: Diagnosis not present

## 2022-10-05 LAB — COMPREHENSIVE METABOLIC PANEL
ALT: 28 U/L (ref 0–44)
AST: 47 U/L — ABNORMAL HIGH (ref 15–41)
Albumin: 2.3 g/dL — ABNORMAL LOW (ref 3.5–5.0)
Alkaline Phosphatase: 130 U/L — ABNORMAL HIGH (ref 38–126)
Anion gap: 6 (ref 5–15)
BUN: 32 mg/dL — ABNORMAL HIGH (ref 8–23)
CO2: 24 mmol/L (ref 22–32)
Calcium: 8.8 mg/dL — ABNORMAL LOW (ref 8.9–10.3)
Chloride: 109 mmol/L (ref 98–111)
Creatinine, Ser: 1.68 mg/dL — ABNORMAL HIGH (ref 0.44–1.00)
GFR, Estimated: 31 mL/min — ABNORMAL LOW (ref >60)
Glucose, Bld: 113 mg/dL — ABNORMAL HIGH (ref 70–99)
Potassium: 5.1 mmol/L (ref 3.5–5.1)
Sodium: 139 mmol/L (ref 135–145)
Total Bilirubin: 0.8 mg/dL (ref 0.3–1.2)
Total Protein: 5.1 g/dL — ABNORMAL LOW (ref 6.5–8.1)

## 2022-10-05 LAB — CBC WITH DIFFERENTIAL/PLATELET
Abs Immature Granulocytes: 0.03 10*3/uL (ref 0.00–0.07)
Basophils Absolute: 0 10*3/uL (ref 0.0–0.1)
Basophils Relative: 0 %
Eosinophils Absolute: 0 10*3/uL (ref 0.0–0.5)
Eosinophils Relative: 0 %
HCT: 33.1 % — ABNORMAL LOW (ref 36.0–46.0)
Hemoglobin: 10.9 g/dL — ABNORMAL LOW (ref 12.0–15.0)
Immature Granulocytes: 0 %
Lymphocytes Relative: 5 %
Lymphs Abs: 0.4 10*3/uL — ABNORMAL LOW (ref 0.7–4.0)
MCH: 30.1 pg (ref 26.0–34.0)
MCHC: 32.9 g/dL (ref 30.0–36.0)
MCV: 91.4 fL (ref 80.0–100.0)
Monocytes Absolute: 0.5 10*3/uL (ref 0.1–1.0)
Monocytes Relative: 5 %
Neutro Abs: 7.9 10*3/uL — ABNORMAL HIGH (ref 1.7–7.7)
Neutrophils Relative %: 90 %
Platelets: 120 10*3/uL — ABNORMAL LOW (ref 150–400)
RBC: 3.62 MIL/uL — ABNORMAL LOW (ref 3.87–5.11)
RDW: 15.4 % (ref 11.5–15.5)
WBC: 8.9 10*3/uL (ref 4.0–10.5)
nRBC: 0 % (ref 0.0–0.2)

## 2022-10-05 LAB — URINALYSIS, ROUTINE W REFLEX MICROSCOPIC
Bilirubin Urine: NEGATIVE
Glucose, UA: NEGATIVE mg/dL
Hgb urine dipstick: NEGATIVE
Ketones, ur: NEGATIVE mg/dL
Leukocytes,Ua: NEGATIVE
Nitrite: NEGATIVE
Protein, ur: NEGATIVE mg/dL
Specific Gravity, Urine: 1.012 (ref 1.005–1.030)
pH: 5 (ref 5.0–8.0)

## 2022-10-05 LAB — RESP PANEL BY RT-PCR (FLU A&B, COVID) ARPGX2
Influenza A by PCR: NEGATIVE
Influenza B by PCR: NEGATIVE
SARS Coronavirus 2 by RT PCR: NEGATIVE

## 2022-10-05 LAB — LIPASE, BLOOD: Lipase: 34 U/L (ref 11–51)

## 2022-10-05 MED ORDER — SODIUM CHLORIDE 0.9 % IV BOLUS
1000.0000 mL | Freq: Once | INTRAVENOUS | Status: AC
  Administered 2022-10-05: 1000 mL via INTRAVENOUS

## 2022-10-05 MED ORDER — ONDANSETRON 4 MG PO TBDP: ORAL_TABLET | ORAL | 0 refills | Status: DC

## 2022-10-05 MED ORDER — AMOXICILLIN-POT CLAVULANATE 875-125 MG PO TABS: 1.0000 | ORAL_TABLET | Freq: Two times a day (BID) | ORAL | 0 refills | Status: DC

## 2022-10-05 MED ORDER — ACETAMINOPHEN 325 MG PO TABS
650.0000 mg | ORAL_TABLET | Freq: Once | ORAL | Status: AC
  Administered 2022-10-05: 650 mg via ORAL
  Filled 2022-10-05: qty 2

## 2022-10-05 MED ORDER — ONDANSETRON HCL 4 MG/2ML IJ SOLN
4.0000 mg | Freq: Once | INTRAMUSCULAR | Status: AC
  Administered 2022-10-05: 4 mg via INTRAVENOUS
  Filled 2022-10-05: qty 2

## 2022-10-05 NOTE — Discharge Instructions (Signed)
Drink plenty of fluids.  Follow-up with your family doctor this week for recheck

## 2022-10-05 NOTE — ED Triage Notes (Signed)
Patient presents to ED via [facility]  from home, c/o nausea and vomiting that started this morning around 9 am. Patient denies chest pain or shortness of breath.

## 2022-10-05 NOTE — ED Provider Notes (Incomplete)
[facility] EMERGENCY DEPARTMENT Provider Note   CSN: [id number] Arrival date & time: 10/05/22  1810     History {Add pertinent medical, surgical, social history, OB history to HPI:1} Chief Complaint  Patient presents with   Nausea    Tasha Roberts is a 81 y.o. female.  Patient complains of nausea vomiting abdominal discomfort.  Patient has history of hypertension and hypothyroidism   Abdominal Pain      Home Medications Prior to Admission medications   Medication Sig Start Date End Date Taking? Authorizing Provider  amoxicillin-clavulanate (AUGMENTIN) 875-125 MG tablet Take 1 tablet by mouth every 12 (twelve) hours. 10/05/22  Yes Milton Ferguson, MD  ondansetron (ZOFRAN-ODT) 4 MG disintegrating tablet 12m ODT q4 hours prn nausea/vomit 10/05/22  Yes ZMilton Ferguson MD  apixaban (ELIQUIS) 5 MG TABS tablet Take 1 tablet (5 mg total) by mouth 2 (two) times daily. 09/12/22   BArnoldo Lenis MD  atorvastatin (LIPITOR) 20 MG tablet TAKE (1) TABLET BY MOUTH ONCE DAILY. Patient taking differently: Take 20 mg by mouth daily. 03/05/22   PLindell Spar MD  Cholecalciferol 50 MCG (2000 UT) CAPS Take 1 capsule (2,000 Units total) by mouth daily. 03/30/22   KAntonieta Pert MD  cyclobenzaprine (FLEXERIL) 10 MG tablet Take 1 tablet (10 mg total) by mouth 2 (two) times daily as needed for muscle spasms. 07/12/22   FMyna BrightM, PA-C  diltiazem (CARDIZEM CD) 120 MG 24 hr capsule TAKE (1) CAPSULE BY MOUTH ONCE DAILY. 05/16/22   BArnoldo Lenis MD  docusate sodium (COLACE) 100 MG capsule Take 1 capsule (100 mg total) by mouth every 12 (twelve) hours. 08/11/22   POrpah Greek MD  feeding supplement (ENSURE ENLIVE / ENSURE PLUS) LIQD Take 237 mLs by mouth 2 (two) times daily between meals. Patient not taking: Reported on 09/29/2022 07/28/22   SHeath LarkD, DO  levothyroxine (SYNTHROID) 100 MCG tablet Take 1 tablet (100 mcg total) by mouth daily before breakfast. 06/09/22   PLindell Spar  MD  lisinopril-hydrochlorothiazide (ZESTORETIC) 20-25 MG tablet Take 1 tablet by mouth daily.    [provider]  nitroGLYCERIN (NITROSTAT) 0.4 MG SL tablet Place 1 tablet (0.4 mg total) under the tongue every 5 (five) minutes as needed for chest pain. 07/31/21   PLindell Spar MD  ondansetron (ZOFRAN) 4 MG tablet Take 1 tablet (4 mg total) by mouth every 8 (eight) hours as needed for nausea or vomiting. 09/01/22   PLindell Spar MD  oxyCODONE (OXY IR/ROXICODONE) 5 MG immediate release tablet Take 1 tablet (5 mg total) by mouth every 4 (four) hours as needed for severe pain. 07/28/22   SManuella Ghazi Pratik D, DO  polyethylene glycol (MIRALAX / GLYCOLAX) 17 g packet Take 17 g by mouth daily as needed for moderate constipation. Patient not taking: Reported on 09/29/2022 08/11/22   POrpah Greek MD  tolterodine (DETROL LA) 4 MG 24 hr capsule Take 1 capsule (4 mg total) by mouth daily. 08/28/22   McKenzie, PCandee Furbish MD  vitamin B-12 (CYANOCOBALAMIN) 500 MCG tablet Take 500 mcg by mouth daily.    [provider]      Allergies    Dicyclomine, Glucophage [metformin], and Losartan    Review of Systems   Review of Systems  Gastrointestinal:  Positive for abdominal pain.    Physical Exam Updated Vital Signs BP (!) 100/51 (BP Location: Right Arm)   Pulse 89   Temp 98.9 F (37.2 C) (Oral)   Resp  16   Ht 5' 3"  (1.6 m)   Wt 51.7 kg   SpO2 95%   BMI 20.19 kg/m  Physical Exam  ED Results / Procedures / Treatments   Labs (all labs ordered are listed, but only abnormal results are displayed) Labs Reviewed  CBC WITH DIFFERENTIAL/PLATELET - Abnormal; Notable for the following components:      Result Value   RBC 3.62 (*)    Hemoglobin 10.9 (*)    HCT 33.1 (*)    Platelets 120 (*)    Neutro Abs 7.9 (*)    Lymphs Abs 0.4 (*)    All other components within normal limits  COMPREHENSIVE METABOLIC PANEL - Abnormal; Notable for the following components:   Glucose, Bld 113 (*)     BUN 32 (*)    Creatinine, Ser 1.68 (*)    Calcium 8.8 (*)    Total Protein 5.1 (*)    Albumin 2.3 (*)    AST 47 (*)    Alkaline Phosphatase 130 (*)    GFR, Estimated 31 (*)    All other components within normal limits  RESP PANEL BY RT-PCR (FLU A&B, COVID) ARPGX2  LIPASE, BLOOD  URINALYSIS, ROUTINE W REFLEX MICROSCOPIC    EKG None  Radiology CT ABDOMEN PELVIS WO CONTRAST  Addendum Date: 10/05/2022   ADDENDUM REPORT: 10/05/2022 22:12 COMPARISON:  CT abdomen and pelvis 03/27/2022. Lumbar spine CT 07/23/2022. FINDINGS: Lower chest: Moderate-sized left pleural effusion with compressive atelectasis of the left lower lobe. Hepatobiliary: Diffuse fatty infiltration. There is nodular liver contour suggesting cirrhosis similar to the prior study. The liver is diffusely heterogeneous. A calcified granulomas present. The gallbladder surgically absent. Pancreas: Unremarkable. No pancreatic ductal dilatation or surrounding inflammatory changes. Spleen: Mildly enlarged, unchanged.  Calcified granulomas are seen. Adrenals/Urinary Tract: There is a 2 mm calculus in the distal left ureter at the level of the ureterovesicular junction. There is no hydronephrosis. There are additional punctate nonobstructing left renal calculi. The right kidney, adrenal glands and bladder are within normal limits. Stomach/Bowel: There is diffuse colonic wall thickening. There is no pneumatosis or free air. The appendix is not seen. No dilated bowel loops are identified. There is some questionable wall thickening of jejunal loops in the left upper quadrant. There is associated mesenteric edema. The stomach appears within normal limits. Vascular/Lymphatic: There are severe atherosclerotic calcifications of the aorta. Aorta and IVC are nondilated. No enlarged lymph nodes are seen. Reproductive: Status post hysterectomy. No adnexal masses. Other: Small volume ascites in all 4 abdominal quadrants. Diffuse body wall edema.  Musculoskeletal: The bones are diffusely osteopenic. Compression fracture of L2 is again seen. Degree of compression has progressed compared to the prior study (now 50 percent). There is mild retropulsion of fracture fragments. IMPRESSION: 1. Diffuse colonic wall thickening compatible with colitis. 2. Wall thickening and mesenteric edema involving jejunal loops in the left upper quadrant concerning for nonspecific enteritis including infectious, inflammatory and ischemic etiologies. 3. Small volume ascites. 4. Cirrhotic liver. 5. Moderate-sized left pleural effusion. 6. Diffuse body wall edema. 7. 2 mm calculus in the distal left ureter without hydronephrosis. 8. Progression of L2 compression fracture. Aortic Atherosclerosis (ICD10-I70.0). These results were called by telephone at the time of interpretation on 10/05/2022 at 10:11 pm to provider [facility] Minchew , who verbally acknowledged these results. Electronically Signed   By: Ronney Asters M.D.   On: 10/05/2022 22:12   Result Date: 10/05/2022 CLINICAL DATA:  Acute abdominal pain. EXAM: CT ABDOMEN AND PELVIS WITHOUT CONTRAST  TECHNIQUE: Multidetector CT imaging of the abdomen and pelvis was performed following the standard protocol without IV contrast. RADIATION DOSE REDUCTION: This exam was performed according to the departmental dose-optimization program which includes automated exposure control, adjustment of the mA and/or kV according to patient size and/or use of iterative reconstruction technique. COMPARISON:  CT abdomen and pelvis 08/24/2022 FINDINGS: Lower chest: No acute abnormality. Hepatobiliary: Hypodensity in the dome of the liver is too small to characterize and unchanged, likely a cyst. Gallbladder is surgically absent. No bile duct dilatation. Pancreas: Unremarkable. No pancreatic ductal dilatation or surrounding inflammatory changes. Spleen: Normal in size without focal abnormality. Adrenals/Urinary Tract: Bilateral renal cysts are unchanged  measuring up to 1 cm. No hydronephrosis or perinephric stranding. Adrenal glands and bladder are within normal limits. Stomach/Bowel: There is moderate fluid distention of the stomach. There is no bowel obstruction, pneumatosis, bowel wall thickening or free air. The appendix is not seen. Small bowel and colonic loops are predominantly fluid-filled. Vascular/Lymphatic: Aortic atherosclerosis. No enlarged abdominal or pelvic lymph nodes. Reproductive: Status post hysterectomy. No adnexal masses. Other: No abdominal wall hernia or abnormality. No abdominopelvic ascites. Musculoskeletal: Degenerative changes affect the spine. 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 IMPRESSION: 1. Moderate fluid distention of the stomach. 2. Fluid-filled small bowel and colonic loops which can be seen in the setting of enteritis. No bowel obstruction. 3. 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 No follow-up imaging is recommended. JACR 2018 Feb; 264-273, Management of the Incidental Renal Mass on CT, [employer] 2021; [id number], Bosniak Classification of Cystic Renal Masses, Version 2019. Aortic Atherosclerosis (ICD10-I70.0). Electronically Signed: By: Ronney Asters M.D. On: 10/05/2022 21:45    Procedures Procedures  {Document cardiac monitor, telemetry assessment procedure when appropriate:1}  Medications Ordered in ED Medications  acetaminophen (TYLENOL) tablet 650 mg (650 mg Oral Given 10/05/22 2038)  sodium chloride 0.9 % bolus 1,000 mL (1,000 mLs Intravenous New Bag/Given 10/05/22 2103)  ondansetron (ZOFRAN) injection 4 mg (4 mg Intravenous Given 10/05/22 2104)    ED Course/ Medical Decision Making/ A&P                           Medical Decision Making Amount and/or Complexity of Data Reviewed Labs: ordered. Radiology: ordered.  Risk OTC drugs. Prescription drug management.  Patient with mild colitis.  She is placed on Augmentin and Zofran and will follow-up with her PCP  {Document critical care time  when appropriate:1} {Document review of labs and clinical decision tools ie heart score, Chads2Vasc2 etc:1}  {Document your independent review of radiology images, and any outside records:1} {Document your discussion with family members, caretakers, and with consultants:1} {Document social determinants of health affecting pt's care:1} {Document your decision making why or why not admission, treatments were needed:1} Final Clinical Impression(s) / ED Diagnoses Final diagnoses:  Colitis    Rx / DC Orders ED Discharge Orders          Ordered    amoxicillin-clavulanate (AUGMENTIN) 875-125 MG tablet  Every 12 hours        10/05/22 2249    ondansetron (ZOFRAN-ODT) 4 MG disintegrating tablet        10/05/22 2249

## 2022-10-06 ENCOUNTER — Inpatient Hospital Stay: Payer: Medicare Other

## 2022-10-07 ENCOUNTER — Encounter (HOSPITAL_COMMUNITY): Payer: Medicare Other

## 2022-10-07 ENCOUNTER — Inpatient Hospital Stay: Payer: Medicare Other | Admitting: Physician Assistant

## 2022-10-07 ENCOUNTER — Other Ambulatory Visit (HOSPITAL_COMMUNITY): Payer: Medicare Other

## 2022-10-09 ENCOUNTER — Inpatient Hospital Stay: Payer: Medicare Other

## 2022-10-09 DIAGNOSIS — E559 Vitamin D deficiency, unspecified: Secondary | ICD-10-CM

## 2022-10-09 DIAGNOSIS — M5090 Cervical disc disorder, unspecified, unspecified cervical region: Secondary | ICD-10-CM

## 2022-10-09 DIAGNOSIS — E079 Disorder of thyroid, unspecified: Secondary | ICD-10-CM

## 2022-10-09 DIAGNOSIS — K7460 Unspecified cirrhosis of liver: Secondary | ICD-10-CM

## 2022-10-09 DIAGNOSIS — L89152 Pressure ulcer of sacral region, stage 2: Secondary | ICD-10-CM | POA: Diagnosis not present

## 2022-10-09 DIAGNOSIS — E785 Hyperlipidemia, unspecified: Secondary | ICD-10-CM

## 2022-10-09 DIAGNOSIS — M8008XD Age-related osteoporosis with current pathological fracture, vertebra(e), subsequent encounter for fracture with routine healing: Secondary | ICD-10-CM | POA: Diagnosis not present

## 2022-10-09 DIAGNOSIS — Z79891 Long term (current) use of opiate analgesic: Secondary | ICD-10-CM

## 2022-10-09 DIAGNOSIS — F32A Depression, unspecified: Secondary | ICD-10-CM

## 2022-10-09 DIAGNOSIS — Z853 Personal history of malignant neoplasm of breast: Secondary | ICD-10-CM

## 2022-10-09 DIAGNOSIS — D649 Anemia, unspecified: Secondary | ICD-10-CM

## 2022-10-09 DIAGNOSIS — Z87891 Personal history of nicotine dependence: Secondary | ICD-10-CM

## 2022-10-09 DIAGNOSIS — Z9181 History of falling: Secondary | ICD-10-CM

## 2022-10-09 DIAGNOSIS — I10 Essential (primary) hypertension: Secondary | ICD-10-CM

## 2022-10-09 DIAGNOSIS — Z87440 Personal history of urinary (tract) infections: Secondary | ICD-10-CM

## 2022-10-09 DIAGNOSIS — K7581 Nonalcoholic steatohepatitis (NASH): Secondary | ICD-10-CM

## 2022-10-09 DIAGNOSIS — I480 Paroxysmal atrial fibrillation: Secondary | ICD-10-CM | POA: Diagnosis not present

## 2022-10-09 DIAGNOSIS — E119 Type 2 diabetes mellitus without complications: Secondary | ICD-10-CM | POA: Diagnosis not present

## 2022-10-13 ENCOUNTER — Ambulatory Visit: Payer: Medicare Other | Admitting: Cardiology

## 2022-10-13 ENCOUNTER — Ambulatory Visit: Payer: Medicare Other

## 2022-10-15 ENCOUNTER — Ambulatory Visit (HOSPITAL_COMMUNITY)
Admission: RE | Admit: 2022-10-15 | Discharge: 2022-10-15 | Disposition: A | Discharge status: Home / Self Care | Payer: Medicare Other | Source: Ambulatory Visit | Attending: Physician Assistant | Admitting: Physician Assistant

## 2022-10-15 ENCOUNTER — Encounter (HOSPITAL_COMMUNITY): Payer: Self-pay | Admitting: Emergency Medicine

## 2022-10-15 ENCOUNTER — Other Ambulatory Visit: Payer: Self-pay

## 2022-10-15 ENCOUNTER — Inpatient Hospital Stay (HOSPITAL_COMMUNITY)
Admission: EM | Admit: 2022-10-15 | Discharge: 2022-10-20 | DRG: 641 | Disposition: A | Discharge status: Home Health | Payer: Medicare Other | Attending: Family Medicine | Admitting: Family Medicine

## 2022-10-15 ENCOUNTER — Encounter (HOSPITAL_COMMUNITY): Payer: Self-pay

## 2022-10-15 DIAGNOSIS — Z79899 Other long term (current) drug therapy: Secondary | ICD-10-CM

## 2022-10-15 DIAGNOSIS — R5381 Other malaise: Secondary | ICD-10-CM | POA: Diagnosis present

## 2022-10-15 DIAGNOSIS — Z9049 Acquired absence of other specified parts of digestive tract: Secondary | ICD-10-CM

## 2022-10-15 DIAGNOSIS — D539 Nutritional anemia, unspecified: Secondary | ICD-10-CM | POA: Diagnosis present

## 2022-10-15 DIAGNOSIS — Z823 Family history of stroke: Secondary | ICD-10-CM

## 2022-10-15 DIAGNOSIS — I10 Essential (primary) hypertension: Secondary | ICD-10-CM | POA: Diagnosis not present

## 2022-10-15 DIAGNOSIS — E039 Hypothyroidism, unspecified: Secondary | ICD-10-CM | POA: Diagnosis not present

## 2022-10-15 DIAGNOSIS — Z79890 Hormone replacement therapy (postmenopausal): Secondary | ICD-10-CM

## 2022-10-15 DIAGNOSIS — D500 Iron deficiency anemia secondary to blood loss (chronic): Secondary | ICD-10-CM | POA: Diagnosis present

## 2022-10-15 DIAGNOSIS — E1122 Type 2 diabetes mellitus with diabetic chronic kidney disease: Secondary | ICD-10-CM | POA: Diagnosis present

## 2022-10-15 DIAGNOSIS — Z853 Personal history of malignant neoplasm of breast: Secondary | ICD-10-CM

## 2022-10-15 DIAGNOSIS — R197 Diarrhea, unspecified: Secondary | ICD-10-CM | POA: Diagnosis not present

## 2022-10-15 DIAGNOSIS — R928 Other abnormal and inconclusive findings on diagnostic imaging of breast: Secondary | ICD-10-CM

## 2022-10-15 DIAGNOSIS — E890 Postprocedural hypothyroidism: Secondary | ICD-10-CM | POA: Diagnosis present

## 2022-10-15 DIAGNOSIS — Z681 Body mass index (BMI) 19 or less, adult: Secondary | ICD-10-CM

## 2022-10-15 DIAGNOSIS — E860 Dehydration: Secondary | ICD-10-CM | POA: Diagnosis not present

## 2022-10-15 DIAGNOSIS — M810 Age-related osteoporosis without current pathological fracture: Secondary | ICD-10-CM | POA: Diagnosis present

## 2022-10-15 DIAGNOSIS — I480 Paroxysmal atrial fibrillation: Secondary | ICD-10-CM | POA: Diagnosis not present

## 2022-10-15 DIAGNOSIS — K7460 Unspecified cirrhosis of liver: Secondary | ICD-10-CM | POA: Diagnosis present

## 2022-10-15 DIAGNOSIS — D649 Anemia, unspecified: Secondary | ICD-10-CM | POA: Diagnosis present

## 2022-10-15 DIAGNOSIS — I2510 Atherosclerotic heart disease of native coronary artery without angina pectoris: Secondary | ICD-10-CM | POA: Diagnosis present

## 2022-10-15 DIAGNOSIS — E440 Moderate protein-calorie malnutrition: Secondary | ICD-10-CM | POA: Diagnosis present

## 2022-10-15 DIAGNOSIS — E8809 Other disorders of plasma-protein metabolism, not elsewhere classified: Secondary | ICD-10-CM | POA: Diagnosis present

## 2022-10-15 DIAGNOSIS — K529 Noninfective gastroenteritis and colitis, unspecified: Secondary | ICD-10-CM | POA: Diagnosis present

## 2022-10-15 DIAGNOSIS — I959 Hypotension, unspecified: Secondary | ICD-10-CM | POA: Diagnosis present

## 2022-10-15 DIAGNOSIS — Z90710 Acquired absence of both cervix and uterus: Secondary | ICD-10-CM

## 2022-10-15 DIAGNOSIS — I129 Hypertensive chronic kidney disease with stage 1 through stage 4 chronic kidney disease, or unspecified chronic kidney disease: Secondary | ICD-10-CM | POA: Diagnosis present

## 2022-10-15 DIAGNOSIS — K219 Gastro-esophageal reflux disease without esophagitis: Secondary | ICD-10-CM | POA: Diagnosis present

## 2022-10-15 DIAGNOSIS — N1832 Chronic kidney disease, stage 3b: Secondary | ICD-10-CM | POA: Diagnosis present

## 2022-10-15 DIAGNOSIS — E782 Mixed hyperlipidemia: Secondary | ICD-10-CM | POA: Diagnosis present

## 2022-10-15 DIAGNOSIS — Z8249 Family history of ischemic heart disease and other diseases of the circulatory system: Secondary | ICD-10-CM

## 2022-10-15 DIAGNOSIS — Z7901 Long term (current) use of anticoagulants: Secondary | ICD-10-CM

## 2022-10-15 DIAGNOSIS — Z923 Personal history of irradiation: Secondary | ICD-10-CM

## 2022-10-15 DIAGNOSIS — K7581 Nonalcoholic steatohepatitis (NASH): Secondary | ICD-10-CM | POA: Diagnosis present

## 2022-10-15 LAB — CBC WITH DIFFERENTIAL/PLATELET
Abs Immature Granulocytes: 0.02 10*3/uL (ref 0.00–0.07)
Basophils Absolute: 0.1 10*3/uL (ref 0.0–0.1)
Basophils Relative: 1 %
Eosinophils Absolute: 0.1 10*3/uL (ref 0.0–0.5)
Eosinophils Relative: 2 %
HCT: 35 % — ABNORMAL LOW (ref 36.0–46.0)
Hemoglobin: 11.3 g/dL — ABNORMAL LOW (ref 12.0–15.0)
Immature Granulocytes: 0 %
Lymphocytes Relative: 38 %
Lymphs Abs: 2.9 10*3/uL (ref 0.7–4.0)
MCH: 29.8 pg (ref 26.0–34.0)
MCHC: 32.3 g/dL (ref 30.0–36.0)
MCV: 92.3 fL (ref 80.0–100.0)
Monocytes Absolute: 0.6 10*3/uL (ref 0.1–1.0)
Monocytes Relative: 8 %
Neutro Abs: 3.8 10*3/uL (ref 1.7–7.7)
Neutrophils Relative %: 51 %
Platelets: 176 10*3/uL (ref 150–400)
RBC: 3.79 MIL/uL — ABNORMAL LOW (ref 3.87–5.11)
RDW: 15.9 % — ABNORMAL HIGH (ref 11.5–15.5)
WBC: 7.5 10*3/uL (ref 4.0–10.5)
nRBC: 0 % (ref 0.0–0.2)

## 2022-10-15 LAB — LIPASE, BLOOD: Lipase: 31 U/L (ref 11–51)

## 2022-10-15 LAB — PHOSPHORUS: Phosphorus: 2.8 mg/dL (ref 2.5–4.6)

## 2022-10-15 LAB — PROCALCITONIN: Procalcitonin: <0.1 ng/mL

## 2022-10-15 LAB — HEPATIC FUNCTION PANEL
ALT: 21 U/L (ref 0–44)
AST: 39 U/L (ref 15–41)
Albumin: 2.4 g/dL — ABNORMAL LOW (ref 3.5–5.0)
Alkaline Phosphatase: 94 U/L (ref 38–126)
Bilirubin, Direct: 0.2 mg/dL (ref 0.0–0.2)
Indirect Bilirubin: 0.9 mg/dL (ref 0.3–0.9)
Total Bilirubin: 1.1 mg/dL (ref 0.3–1.2)
Total Protein: 5.2 g/dL — ABNORMAL LOW (ref 6.5–8.1)

## 2022-10-15 LAB — CORTISOL: Cortisol, Plasma: 22.1 ug/dL

## 2022-10-15 LAB — BASIC METABOLIC PANEL
Anion gap: 9 (ref 5–15)
BUN: 23 mg/dL (ref 8–23)
CO2: 19 mmol/L — ABNORMAL LOW (ref 22–32)
Calcium: 8.2 mg/dL — ABNORMAL LOW (ref 8.9–10.3)
Chloride: 108 mmol/L (ref 98–111)
Creatinine, Ser: 1.6 mg/dL — ABNORMAL HIGH (ref 0.44–1.00)
GFR, Estimated: 33 mL/min — ABNORMAL LOW (ref >60)
Glucose, Bld: 86 mg/dL (ref 70–99)
Potassium: 4.1 mmol/L (ref 3.5–5.1)
Sodium: 136 mmol/L (ref 135–145)

## 2022-10-15 LAB — C DIFFICILE QUICK SCREEN W PCR REFLEX
C Diff antigen: POSITIVE — AB
C Diff toxin: NEGATIVE

## 2022-10-15 LAB — MAGNESIUM: Magnesium: 1.9 mg/dL (ref 1.7–2.4)

## 2022-10-15 MED ORDER — SODIUM CHLORIDE 0.9 % IV BOLUS
1000.0000 mL | Freq: Once | INTRAVENOUS | Status: AC
  Administered 2022-10-15: 1000 mL via INTRAVENOUS

## 2022-10-15 MED ORDER — APIXABAN 5 MG PO TABS: 5.0000 mg | ORAL_TABLET | Freq: Two times a day (BID) | ORAL | Status: DC

## 2022-10-15 MED ORDER — APIXABAN 2.5 MG PO TABS
2.5000 mg | ORAL_TABLET | Freq: Two times a day (BID) | ORAL | Status: DC
  Administered 2022-10-15 – 2022-10-19 (×8): 2.5 mg via ORAL
  Filled 2022-10-15 (×8): qty 1

## 2022-10-15 MED ORDER — ATORVASTATIN CALCIUM 20 MG PO TABS
20.0000 mg | ORAL_TABLET | Freq: Every day | ORAL | Status: DC
  Administered 2022-10-15 – 2022-10-20 (×6): 20 mg via ORAL
  Filled 2022-10-15 (×6): qty 1

## 2022-10-15 MED ORDER — ACETAMINOPHEN 650 MG RE SUPP: 650.0000 mg | Freq: Four times a day (QID) | RECTAL | Status: DC | PRN

## 2022-10-15 MED ORDER — ACETAMINOPHEN 325 MG PO TABS: 650.0000 mg | ORAL_TABLET | Freq: Four times a day (QID) | ORAL | Status: DC | PRN

## 2022-10-15 MED ORDER — INFLUENZA VAC A&B SA ADJ QUAD 0.5 ML IM PRSY: 0.5000 mL | PREFILLED_SYRINGE | INTRAMUSCULAR | Status: DC

## 2022-10-15 MED ORDER — LEVOTHYROXINE SODIUM 100 MCG PO TABS
100.0000 ug | ORAL_TABLET | Freq: Every day | ORAL | Status: DC
  Administered 2022-10-16 – 2022-10-20 (×5): 100 ug via ORAL
  Filled 2022-10-15 (×5): qty 1

## 2022-10-15 MED ORDER — MIDODRINE HCL 5 MG PO TABS
5.0000 mg | ORAL_TABLET | Freq: Three times a day (TID) | ORAL | Status: DC
  Administered 2022-10-15 – 2022-10-20 (×15): 5 mg via ORAL
  Filled 2022-10-15 (×15): qty 1

## 2022-10-15 MED ORDER — LACTATED RINGERS IV SOLN: INTRAVENOUS | Status: DC

## 2022-10-15 NOTE — ED Triage Notes (Signed)
Pt just finished abx for "stomach infection". Has had diarrhea since started it 10/05/22. Denies black or bloody stools. Nausea is chronic. Pt having dry heaves. Slightly pale intriage. C/o gen weakness. Denies dizziness.

## 2022-10-15 NOTE — ED Provider Notes (Addendum)
[facility] EMERGENCY DEPARTMENT Provider Note   CSN: [id number] Arrival date & time: 10/15/22  5170     History  Chief Complaint  Patient presents with   Emesis   Diarrhea    Tasha Roberts is a 81 y.o. female presenting emergency department with nausea and diarrhea.  The patient was in the ED about 2 weeks ago and diagnosed with colitis, and discharged on Augmentin, which she completed at home.  She reports that the diarrhea has worsened since the antibiotics.  She is having loose and formed stools.  She reports poor appetite persistent.  She says her blood pressures been dropping low at home, and she feels that she is dehydrated.  She is here with her daughter.  She is on Eliquis for A-fib.  She reports she has been compliant with all of her home medications.  HPI     Home Medications Prior to Admission medications   Medication Sig Start Date End Date Taking? Authorizing Provider  apixaban (ELIQUIS) 5 MG TABS tablet Take 1 tablet (5 mg total) by mouth 2 (two) times daily. 09/12/22  Yes Arnoldo Lenis, MD  Cholecalciferol 50 MCG (2000 UT) CAPS Take 1 capsule (2,000 Units total) by mouth daily. 03/30/22  Yes Antonieta Pert, MD  cyclobenzaprine (FLEXERIL) 10 MG tablet Take 1 tablet (10 mg total) by mouth 2 (two) times daily as needed for muscle spasms. 07/12/22  Yes Raul Del, Conner M, PA-C  diltiazem (CARDIZEM CD) 120 MG 24 hr capsule TAKE (1) CAPSULE BY MOUTH ONCE DAILY. 05/16/22  Yes Branch, Alphonse Guild, MD  docusate sodium (COLACE) 100 MG capsule Take 1 capsule (100 mg total) by mouth every 12 (twelve) hours. 08/11/22  Yes Pollina, Gwenyth Allegra, MD  levothyroxine (SYNTHROID) 100 MCG tablet Take 1 tablet (100 mcg total) by mouth daily before breakfast. 06/09/22  Yes Lindell Spar, MD  lisinopril-hydrochlorothiazide (ZESTORETIC) 20-25 MG tablet Take 1 tablet by mouth daily.   Yes [provider]  nitroGLYCERIN (NITROSTAT) 0.4 MG SL tablet Place 1 tablet (0.4 mg total) under the  tongue every 5 (five) minutes as needed for chest pain. 07/31/21  Yes Lindell Spar, MD  ondansetron (ZOFRAN) 4 MG tablet Take 1 tablet (4 mg total) by mouth every 8 (eight) hours as needed for nausea or vomiting. 09/01/22  Yes Lindell Spar, MD  tolterodine (DETROL LA) 4 MG 24 hr capsule Take 1 capsule (4 mg total) by mouth daily. 08/28/22  Yes McKenzie, Candee Furbish, MD  vitamin B-12 (CYANOCOBALAMIN) 500 MCG tablet Take 500 mcg by mouth daily.   Yes [provider]  amoxicillin-clavulanate (AUGMENTIN) 875-125 MG tablet Take 1 tablet by mouth every 12 (twelve) hours. Patient not taking: Reported on 10/15/2022 10/05/22   Milton Ferguson, MD  atorvastatin (LIPITOR) 20 MG tablet TAKE (1) TABLET BY MOUTH ONCE DAILY. Patient taking differently: Take 20 mg by mouth daily. 03/05/22   Lindell Spar, MD  feeding supplement (ENSURE ENLIVE / ENSURE PLUS) LIQD Take 237 mLs by mouth 2 (two) times daily between meals. Patient not taking: Reported on 09/29/2022 07/28/22   Heath Lark D, DO  ondansetron (ZOFRAN-ODT) 4 MG disintegrating tablet 26m ODT q4 hours prn nausea/vomit Patient not taking: Reported on 10/15/2022 10/05/22   ZMilton Ferguson MD  oxyCODONE (OXY IR/ROXICODONE) 5 MG immediate release tablet Take 1 tablet (5 mg total) by mouth every 4 (four) hours as needed for severe pain. Patient not taking: Reported on 10/15/2022 07/28/22   SHeath LarkD, DO  polyethylene glycol (MIRALAX / GLYCOLAX) 17 g packet Take 17 g by mouth daily as needed for moderate constipation. Patient not taking: Reported on 09/29/2022 08/11/22   Orpah Greek, MD      Allergies    Dicyclomine, Glucophage [metformin], and Losartan    Review of Systems   Review of Systems  Physical Exam Updated Vital Signs BP (!) 95/51   Pulse 82   Temp (!) 97.5 F (36.4 C) (Oral)   Resp 13   Ht 5' 3"  (1.6 m)   Wt 42.6 kg   SpO2 100%   BMI 16.65 kg/m  Physical Exam Constitutional:      General: She is not in acute  distress. HENT:     Head: Normocephalic and atraumatic.  Eyes:     Conjunctiva/sclera: Conjunctivae normal.     Pupils: Pupils are equal, round, and reactive to light.  Cardiovascular:     Rate and Rhythm: Regular rhythm. Tachycardia present.  Pulmonary:     Effort: Pulmonary effort is normal. No respiratory distress.  Abdominal:     General: There is no distension.     Tenderness: There is no abdominal tenderness.  Skin:    General: Skin is warm and dry.  Neurological:     General: No focal deficit present.     Mental Status: She is alert. Mental status is at baseline.  Psychiatric:        Mood and Affect: Mood normal.        Behavior: Behavior normal.     ED Results / Procedures / Treatments   Labs (all labs ordered are listed, but only abnormal results are displayed) Labs Reviewed  BASIC METABOLIC PANEL - Abnormal; Notable for the following components:      Result Value   CO2 19 (*)    Creatinine, Ser 1.60 (*)    Calcium 8.2 (*)    GFR, Estimated 33 (*)    All other components within normal limits  CBC WITH DIFFERENTIAL/PLATELET - Abnormal; Notable for the following components:   RBC 3.79 (*)    Hemoglobin 11.3 (*)    HCT 35.0 (*)    RDW 15.9 (*)    All other components within normal limits  HEPATIC FUNCTION PANEL - Abnormal; Notable for the following components:   Total Protein 5.2 (*)    Albumin 2.4 (*)    All other components within normal limits  C DIFFICILE QUICK SCREEN W PCR REFLEX    GASTROINTESTINAL PANEL BY PCR, STOOL (REPLACES STOOL CULTURE)  LIPASE, BLOOD  MAGNESIUM    EKG None  Radiology No results found.  Procedures Procedures    Medications Ordered in ED Medications  sodium chloride 0.9 % bolus 1,000 mL (0 mLs Intravenous Stopped 10/15/22 1217)    ED Course/ Medical Decision Making/ A&P Clinical Course as of 10/15/22 1625  Wed Oct 15, 2022  1101 Despite low blood pressure patient was awake and mentating well, did not appear  encephalopathic or showing signs of shock.  Her blood work also does not demonstrate signs of endorgan damage from shock.  I suspect that the hypotension may be transient, we will keep a close eye on it, recheck after fluids [MT]  1453 Blood pressure still soft, patient has persistent diarrhea and poor p.o. intake, will admit to the hospital [MT]    Clinical Course User Index [MT] Glorine Hanratty, Carola Rhine, MD  Medical Decision Making Amount and/or Complexity of Data Reviewed Labs: ordered.  Risk Decision regarding hospitalization.   This patient presents to the ED with concern for diarrhea, hypotension. This involves an extensive number of treatment options, and is a complaint that carries with it a high risk of complications and morbidity.  The differential diagnosis includes dehydration versus anemia versus electrolyte derangement versus other vs c diff colitis  Co-morbidities that complicate the patient evaluation: Recent antibiotic use and colitis at high risk for C. difficile infection  Additional history obtained from daughter  I ordered and personally interpreted labs.  The pertinent results include: Creatinine mildly elevated at 1.6, calcium low at 8.2, potassium within normal limits.  No leukocytosis.  Magnesium within normal limits.  I do not see an indication for repeat CT imaging of the abdomen, the low suspicion of intra-abdominal abscess, GI perforation.  She reports overall the pain had improved her abdomen except for the diarrhea.  This presentation is not consistent with mesenteric ischemia, as there is no significant pain out of proportion to her exam at this time.  Also doubt AAA.  The patient was maintained on a cardiac monitor.  I personally viewed and interpreted the cardiac monitored which showed an underlying rhythm of: Sinus rhythm  I ordered medication including IV fluids for hypotension, suspected to be due to poor oral intake  I have reviewed  the patients home medicines and have made adjustments as needed   After the interventions noted above, I reevaluated the patient and found that they have: improved  Overall her blood pressure improved with the back of fluids.  However the patient still feels significantly weak, with her persistent diarrhea, and remains at high risk for C. difficile.  I think given her age is reasonable to admit her to the hospital for hydration overnight, hopefully collect a stool sample while she is here.  The patient is agreeable to staying.  Dispostion:  After consideration of the diagnostic results and the patients response to treatment, I feel that the patent would benefit from medical admission         Final Clinical Impression(s) / ED Diagnoses Final diagnoses:  Diarrhea, unspecified type  Hypotension, unspecified hypotension type    Rx / DC Orders ED Discharge Orders     None         Gicela Schwarting, Carola Rhine, MD 10/15/22 1604    Wyvonnia Dusky, MD 10/15/22 1625

## 2022-10-15 NOTE — H&P (Signed)
[facility] H&P   Patient Demographics:    Tasha Roberts, is a 81 y.o. female  MRN: [id number]   DOB - [birth year]  Admit Date - 10/15/2022  Outpatient Primary MD for the patient is Lindell Spar, MD  Referring MD/NP/PA: Dr Langston Masker  Patient coming from: Home  Chief Complaint  Patient presents with   Emesis   Diarrhea      HPI:    Tasha Roberts  is a 81 y.o. female, with medical history significant of chronic constipation, clotting disorder, depression, diabetes mellitus type 2, GERD, breast cancer, hyperlipidemia, hypertension, hypothyroidism, [place] liver cirrhosis, paroxysmal atrial fibrillation, [place] to ED secondary to complaints of nausea and diarrhea.  Patient reports chronic diarrhea over the last 3 weeks, she had an ED visit 2 weeks ago diagnosed with colitis, discharged on Augmentin, she completed the course at home, reports abdominal pain much improved, diarrhea has improved as well but persisted, she does report some nausea as well, denies vomiting, reports poor appetite as well specially had some issues with her dentures and cannot tolerate regular food, or diarrhea has improved but has been intermittent between watery and loose stool at this point, she feels she is dehydrated, reports she has been checking her blood pressure at home which has been recently low, reports she has been compliant with her Eliquis. -In ED she was initially hypotensive, but she responded to fluid bolus, [place] exam was benign, so imaging was deferred, but she was clinically dehydrated, so [facility] hospitalist consulted to admit.    Review of systems:     A full 10 point Review of Systems was done, except as stated above, all other Review of Systems were negative.   With Past History of the following :    Past Medical History:  Diagnosis Date   Age related osteoporosis    Anemia due to blood loss    iron  infusion's last one 03-25-2021 (pt bleeding post vulvar bx 02-11-2021, hospiatal admission 02-21-2021)   Anticoagulant long-term use    eliquis--- managed by pcp    Arthritis    hands   Bronchiectasis [facility])    pulmonology --- dr Halford Chessman   (04-01-2021  pt stated no supplemental oxygen use since 01/ 2022 and no inhaler use,  stated checks O2 at home during the day 98-99%)   Cancer ([place]) 2013   Lumpectomy left breast   Cataract 2015   Chronic constipation    Clotting disorder ([place])    On eliquis   Coronary artery calcification    per lexiscan 08-17-2019 result in care everywhere   Depression, major, single episode, moderate ([place]) 12/05/2021   DOE (dyspnea on exertion)    04-01-2021  per pt only going up stairs    Gastroesophageal reflux disease 12/05/2021   History of diabetes mellitus, type II    04-01-2021  per pt no issues after weight loss, last taken  medication approx 2017   History of Graves' disease 2017   s/p RAI   History of left breast cancer    dx 2013,  s/p left partial mastecotmy w/ node dissection 09-15-2012, low grade DCIS, no chemo, completed radiation 02/ 2014,  per pt no recurrence   History of respiratory syncytial virus (RSV) infection 09/2020   w/ hypoxia  , hospital admission 10/ 2021  and acute exacerbation bronchiectasis;  follow up post hospital pulmonogy dr Halford Chessman  note in epic   Hyperlipidemia    Hypertension    followed by pcp   (pt had lexiscan in care everywhere dated 08-17-2019 no ischemia , non-obstructive extensive calcification, ef 69%)   Hypothyroidism    Hypothyroidism, postradioiodine therapy    followed by pcp   Liver cirrhosis secondary to NASH (nonalcoholic steatohepatitis) ([place])    followed by dr Merrilee Jansky ([facility])---- dx 2004,  compensated ,  last liver bx (03/ 2015) fibrosis stage 3;  moderate portal hypertensive gastrophy   PAF (paroxysmal atrial fibrillation) ([place]) previous cardiologist lov note w/ Suann Larry PA ([facility] in [place]) dated 01-24-2020 in care everywhere;  (04-01-2021 per pt has appt w/ new cardiologist , dr j. branch 04-30-2021)   first dx 2020--  had event monitor/ stress test/ echo all results in care everywhere (monitor 08-03-2019 short runs AFib conversion pause <2 secconds, rate controlled;  echo 12-14-2019 ef 60%, mild concentric LVH, G2DD, RSVP 50.74mHg)   Personal history of radiation therapy 2013   left breast cancer,  completed 02/ 2014   PONV (postoperative nausea and vomiting)    Sarcoidosis, lung ([facility] pulmonology--- dr sHalford Chessman  04-01-2021  per pt dx age 898s, no issue since   Sepsis secondary to UTI [place] 03/27/2022   Thrombocytopenia ([facility]    hematology/ oncology--- dr s. kDelton Coombes  VIN III (vulvar intraepithelial neoplasia III)       Past Surgical History:  Procedure Laterality Date   [place]  BREAST SURGERY  09/15/2012   CATARACT EXTRACTION W/ INTRAOCULAR LENS  IMPLANT, BILATERAL  2016   CHOLECYSTECTOMY OPEN  16073  CO2 LASER APPLICATION N/A 071/05/2693  Procedure: CO2 LASER APPLICATION OF THE VULVA;  Surgeon: REveritt Amber MD;  [place] [facility]  Service: Gynecology;  Laterality: N/A;   COLONOSCOPY WITH ESOPHAGOGASTRODUODENOSCOPY (EGD)  last one 08/ 2018  @[facility]    EYE SURGERY  2015   Cataract removed both eyes   LUMBAR LAMINECTOMY  2009   PARTIAL MASTECTOMY WITH AXILLARY SENTINEL LYMPH NODE BIOPSY [place]2012   [place]  TUBAL LIGATION  08/08/1969      Social History:     Social History   Tobacco Use   Smoking status: Never   Smokeless tobacco: Never  Substance Use Topics   Alcohol use: Not Currently       Family History :     Family History  Problem Relation Age of Onset   Hypertension Mother    Dementia Mother    Vision loss Mother    Heart disease Father    Hearing loss Father    Stroke Maternal Grandfather    Heart disease Brother       Home Medications:   Prior to Admission medications   Medication Sig Start Date End Date Taking? Authorizing Provider  apixaban (ELIQUIS) 5 MG TABS tablet Take 1 tablet (5 mg total)  by mouth 2 (two) times daily. 09/12/22  Yes Arnoldo Lenis, MD  Cholecalciferol 50 MCG (2000 UT) CAPS Take 1 capsule (2,000 Units total) by mouth daily. 03/30/22  Yes Antonieta Pert, MD  cyclobenzaprine (FLEXERIL) 10 MG tablet Take 1 tablet (10 mg total) by mouth 2 (two) times daily as needed for muscle spasms. 07/12/22  Yes Raul Del, Conner M, PA-C  diltiazem (CARDIZEM CD) 120 MG 24 hr capsule TAKE (1) CAPSULE BY MOUTH ONCE DAILY. 05/16/22  Yes Branch, Alphonse Guild, MD  docusate sodium (COLACE) 100 MG capsule Take 1 capsule (100 mg total) by mouth every 12 (twelve) hours. 08/11/22  Yes Pollina, Gwenyth Allegra, MD  levothyroxine (SYNTHROID) 100 MCG tablet Take 1 tablet (100 mcg total) by mouth daily before breakfast. 06/09/22  Yes Lindell Spar, MD  lisinopril-hydrochlorothiazide (ZESTORETIC) 20-25 MG tablet Take 1 tablet by mouth daily.   Yes [provider]  nitroGLYCERIN (NITROSTAT) 0.4 MG SL tablet Place 1 tablet (0.4 mg total) under the tongue every 5 (five) minutes as needed for chest pain. 07/31/21  Yes Lindell Spar, MD  ondansetron (ZOFRAN) 4 MG tablet Take 1 tablet (4 mg total) by mouth every 8 (eight) hours as needed for nausea or vomiting. 09/01/22  Yes Lindell Spar, MD  tolterodine (DETROL LA) 4 MG 24 hr capsule Take 1 capsule (4 mg total) by mouth daily. 08/28/22  Yes McKenzie, Candee Furbish, MD  vitamin B-12 (CYANOCOBALAMIN) 500 MCG tablet Take 500 mcg by mouth daily.   Yes [provider]  amoxicillin-clavulanate (AUGMENTIN) 875-125 MG tablet Take 1 tablet by mouth every 12 (twelve) hours. Patient not taking: Reported on 10/15/2022 10/05/22   Milton Ferguson, MD  atorvastatin (LIPITOR) 20 MG tablet TAKE (1) TABLET BY MOUTH ONCE DAILY. Patient taking differently: Take 20 mg by mouth daily. 03/05/22    Lindell Spar, MD  feeding supplement (ENSURE ENLIVE / ENSURE PLUS) LIQD Take 237 mLs by mouth 2 (two) times daily between meals. Patient not taking: Reported on 09/29/2022 07/28/22   Heath Lark D, DO  ondansetron (ZOFRAN-ODT) 4 MG disintegrating tablet 54m ODT q4 hours prn nausea/vomit Patient not taking: Reported on 10/15/2022 10/05/22   ZMilton Ferguson MD  oxyCODONE (OXY IR/ROXICODONE) 5 MG immediate release tablet Take 1 tablet (5 mg total) by mouth every 4 (four) hours as needed for severe pain. Patient not taking: Reported on 10/15/2022 07/28/22   SHeath LarkD, DO  polyethylene glycol (MIRALAX / GLYCOLAX) 17 g packet Take 17 g by mouth daily as needed for moderate constipation. Patient not taking: Reported on 09/29/2022 08/11/22   POrpah Greek MD     Allergies:     Allergies  Allergen Reactions   Dicyclomine Other (See Comments)    Dry cough   Glucophage [Metformin] Diarrhea   Losartan Other (See Comments)    Heartburn      Physical Exam:   Vitals  Blood pressure (!) 95/51, pulse 82, temperature (!) 97.5 F (36.4 C), temperature source Oral, resp. rate 13, height 5' 3"  (1.6 m), weight 42.6 kg, SpO2 100 %.   1. General extremely frail female, laying in bed, in no apparent distress, deconditioned  2. Normal affect and insight, Not Suicidal or Homicidal, Awake Alert, Oriented X 3.  3. No F.N deficits, ALL C.Nerves Intact, Strength 5/5 all 4 extremities, Sensation intact all 4 extremities, Plantars down going.  4. Ears and Eyes appear Normal, Conjunctivae clear, PERRLA. Moist Oral Mucosa.  5. Supple Neck, No JVD, No  cervical lymphadenopathy appriciated, No Carotid Bruits.  6. Symmetrical Chest wall movement, Good air movement bilaterally, CTAB.  7.  Irregular irregular, No Gallops, Rubs or Murmurs, No Parasternal Heave.  8. Positive Bowel Sounds, Abdomen Soft, No tenderness, No organomegaly appriciated,No rebound -guarding or rigidity.  9.  No Cyanosis,  Normal Skin Turgor, No Skin Rash or Bruise.  10. Good muscle tone,  joints appear normal , no effusions, Normal ROM.    Data Review:    CBC Recent Labs  Lab 10/15/22 1030  WBC 7.5  HGB 11.3*  HCT 35.0*  PLT 176  MCV 92.3  MCH 29.8  MCHC 32.3  RDW 15.9*  LYMPHSABS 2.9  MONOABS 0.6  EOSABS 0.1  BASOSABS 0.1   ------------------------------------------------------------------------------------------------------------------  Chemistries  Recent Labs  Lab 10/15/22 1030  NA 136  K 4.1  CL 108  CO2 19*  GLUCOSE 86  BUN 23  CREATININE 1.60*  CALCIUM 8.2*  MG 1.9  AST 39  ALT 21  ALKPHOS 94  BILITOT 1.1   ------------------------------------------------------------------------------------------------------------------ estimated creatinine clearance is 19.2 mL/min (A) (by C-G formula based on SCr of 1.6 mg/dL (H)). ------------------------------------------------------------------------------------------------------------------ No results for input(s): "TSH", "T4TOTAL", "T3FREE", "THYROIDAB" in the last 72 hours.  Invalid input(s): "FREET3"  Coagulation profile No results for input(s): "INR", "PROTIME" in the last 168 hours. ------------------------------------------------------------------------------------------------------------------- No results for input(s): "DDIMER" in the last 72 hours. -------------------------------------------------------------------------------------------------------------------  Cardiac Enzymes No results for input(s): "CKMB", "TROPONINI", "MYOGLOBIN" in the last 168 hours.  Invalid input(s): "CK" ------------------------------------------------------------------------------------------------------------------    Component Value Date/Time   BNP 107.0 (H) 10/01/2020 1609     ---------------------------------------------------------------------------------------------------------------  Urinalysis    Component Value Date/Time    COLORURINE YELLOW 10/05/2022 1947   APPEARANCEUR CLEAR 10/05/2022 1947   APPEARANCEUR Clear 06/16/2022 1340   LABSPEC 1.012 10/05/2022 1947   PHURINE 5.0 10/05/2022 1947   GLUCOSEU NEGATIVE 10/05/2022 1947   HGBUR NEGATIVE 10/05/2022 1947   BILIRUBINUR NEGATIVE 10/05/2022 1947   BILIRUBINUR Negative 06/16/2022 [place] 10/05/2022 1947   PROTEINUR NEGATIVE 10/05/2022 1947   UROBILINOGEN 0.2 08/13/2021 0853   NITRITE NEGATIVE 10/05/2022 1947   LEUKOCYTESUR NEGATIVE 10/05/2022 1947    ----------------------------------------------------------------------------------------------------------------   Imaging Results:    No results found.     Assessment & Plan:    Principal Problem:   Diarrhea Active Problems:   AF (paroxysmal atrial fibrillation) (HCC)   Acquired hypothyroidism   Essential hypertension   Mixed hyperlipidemia   Chronic anemia   CAD (coronary artery disease)   Chronic diarrhea    Hypotension Dehydration Diarrhea -Patient presents with poor oral intake, diarrhea, cynically dehydrated -She responded to IV fluids initially, continue with IV fluids and monitor overnight. -Check random cortisol level -Check procalcitonin level -Hold on IV antibiotics for now and continue to monitor clinically -Abdomen exam is benign, will hold on repeat imaging, had CT abdomen pelvis 10/29 significant for colitis/enteritis  -Check C. difficile and GI panel -Will start on low-dose midodrine till she is appropriately IV hydrated.  Paroxysmal A-fib -Heart rate controlled  -Continue with Eliquis for anticoagulation  we will hold Cardizem for now given hypotension on presentation, and if needed might give as needed digoxin (will use very carefully given her CKD)   CKD stage IIIb -Renal function at baseline, continue with IV fluids.  Hyperlipidemia  - continue with home dose statin  GERD -Continue with PPI  Hypothyroidism -Continue with  Synthroid  Hypertension  - Her blood pressure is actually low, so we will  hold home meds        DVT Prophylaxis on Eliquis  AM Labs Ordered, also please review Full Orders  Family Communication: Admission, patients condition and plan of care including tests being ordered have been discussed with the patient  who indicate understanding and agree with the plan and Code Status.  Code Status Full  Likely DC to  home  Condition GUARDED    Consults called: none    Admission status: observation    Time spent in minutes : 70 minutes   Phillips Climes M.D on 10/15/2022 at 4:55 PM   [facility] - Office  [phone]

## 2022-10-16 ENCOUNTER — Inpatient Hospital Stay (HOSPITAL_COMMUNITY): Payer: Medicare Other

## 2022-10-16 ENCOUNTER — Telehealth (HOSPITAL_COMMUNITY): Payer: Self-pay | Admitting: Pharmacy Technician

## 2022-10-16 ENCOUNTER — Inpatient Hospital Stay: Payer: Self-pay

## 2022-10-16 ENCOUNTER — Encounter (HOSPITAL_COMMUNITY): Payer: Self-pay | Admitting: Hematology

## 2022-10-16 ENCOUNTER — Other Ambulatory Visit (HOSPITAL_COMMUNITY): Payer: Self-pay

## 2022-10-16 DIAGNOSIS — Z923 Personal history of irradiation: Secondary | ICD-10-CM | POA: Diagnosis not present

## 2022-10-16 DIAGNOSIS — D500 Iron deficiency anemia secondary to blood loss (chronic): Secondary | ICD-10-CM | POA: Diagnosis present

## 2022-10-16 DIAGNOSIS — K7581 Nonalcoholic steatohepatitis (NASH): Secondary | ICD-10-CM | POA: Diagnosis present

## 2022-10-16 DIAGNOSIS — D539 Nutritional anemia, unspecified: Secondary | ICD-10-CM | POA: Diagnosis present

## 2022-10-16 DIAGNOSIS — I480 Paroxysmal atrial fibrillation: Secondary | ICD-10-CM | POA: Diagnosis present

## 2022-10-16 DIAGNOSIS — E890 Postprocedural hypothyroidism: Secondary | ICD-10-CM | POA: Diagnosis present

## 2022-10-16 DIAGNOSIS — N1832 Chronic kidney disease, stage 3b: Secondary | ICD-10-CM | POA: Diagnosis present

## 2022-10-16 DIAGNOSIS — Z79890 Hormone replacement therapy (postmenopausal): Secondary | ICD-10-CM | POA: Diagnosis not present

## 2022-10-16 DIAGNOSIS — Z8249 Family history of ischemic heart disease and other diseases of the circulatory system: Secondary | ICD-10-CM | POA: Diagnosis not present

## 2022-10-16 DIAGNOSIS — Z9049 Acquired absence of other specified parts of digestive tract: Secondary | ICD-10-CM | POA: Diagnosis not present

## 2022-10-16 DIAGNOSIS — I4811 Longstanding persistent atrial fibrillation: Secondary | ICD-10-CM | POA: Diagnosis not present

## 2022-10-16 DIAGNOSIS — E8809 Other disorders of plasma-protein metabolism, not elsewhere classified: Secondary | ICD-10-CM | POA: Diagnosis present

## 2022-10-16 DIAGNOSIS — M810 Age-related osteoporosis without current pathological fracture: Secondary | ICD-10-CM | POA: Diagnosis present

## 2022-10-16 DIAGNOSIS — I959 Hypotension, unspecified: Secondary | ICD-10-CM | POA: Diagnosis present

## 2022-10-16 DIAGNOSIS — E860 Dehydration: Secondary | ICD-10-CM | POA: Diagnosis present

## 2022-10-16 DIAGNOSIS — E1122 Type 2 diabetes mellitus with diabetic chronic kidney disease: Secondary | ICD-10-CM | POA: Diagnosis present

## 2022-10-16 DIAGNOSIS — Z79899 Other long term (current) drug therapy: Secondary | ICD-10-CM | POA: Diagnosis not present

## 2022-10-16 DIAGNOSIS — K529 Noninfective gastroenteritis and colitis, unspecified: Secondary | ICD-10-CM | POA: Diagnosis present

## 2022-10-16 DIAGNOSIS — R197 Diarrhea, unspecified: Secondary | ICD-10-CM | POA: Diagnosis present

## 2022-10-16 DIAGNOSIS — I129 Hypertensive chronic kidney disease with stage 1 through stage 4 chronic kidney disease, or unspecified chronic kidney disease: Secondary | ICD-10-CM | POA: Diagnosis present

## 2022-10-16 DIAGNOSIS — Z681 Body mass index (BMI) 19 or less, adult: Secondary | ICD-10-CM | POA: Diagnosis not present

## 2022-10-16 DIAGNOSIS — K219 Gastro-esophageal reflux disease without esophagitis: Secondary | ICD-10-CM | POA: Diagnosis present

## 2022-10-16 DIAGNOSIS — E440 Moderate protein-calorie malnutrition: Secondary | ICD-10-CM | POA: Diagnosis present

## 2022-10-16 DIAGNOSIS — K7460 Unspecified cirrhosis of liver: Secondary | ICD-10-CM | POA: Diagnosis present

## 2022-10-16 DIAGNOSIS — I9589 Other hypotension: Secondary | ICD-10-CM | POA: Diagnosis not present

## 2022-10-16 DIAGNOSIS — A09 Infectious gastroenteritis and colitis, unspecified: Secondary | ICD-10-CM | POA: Diagnosis not present

## 2022-10-16 DIAGNOSIS — Z7901 Long term (current) use of anticoagulants: Secondary | ICD-10-CM | POA: Diagnosis not present

## 2022-10-16 DIAGNOSIS — E782 Mixed hyperlipidemia: Secondary | ICD-10-CM | POA: Diagnosis present

## 2022-10-16 LAB — GASTROINTESTINAL PANEL BY PCR, STOOL (REPLACES STOOL CULTURE)

## 2022-10-16 LAB — CBC
HCT: 27.7 % — ABNORMAL LOW (ref 36.0–46.0)
Hemoglobin: 9.3 g/dL — ABNORMAL LOW (ref 12.0–15.0)
MCH: 30.3 pg (ref 26.0–34.0)
MCHC: 33.6 g/dL (ref 30.0–36.0)
MCV: 90.2 fL (ref 80.0–100.0)
Platelets: 100 10*3/uL — ABNORMAL LOW (ref 150–400)
RBC: 3.07 MIL/uL — ABNORMAL LOW (ref 3.87–5.11)
RDW: 15.7 % — ABNORMAL HIGH (ref 11.5–15.5)
WBC: 4.5 10*3/uL (ref 4.0–10.5)
nRBC: 0 % (ref 0.0–0.2)

## 2022-10-16 LAB — BASIC METABOLIC PANEL
Anion gap: 5 (ref 5–15)
BUN: 19 mg/dL (ref 8–23)
CO2: 21 mmol/L — ABNORMAL LOW (ref 22–32)
Calcium: 7.8 mg/dL — ABNORMAL LOW (ref 8.9–10.3)
Chloride: 112 mmol/L — ABNORMAL HIGH (ref 98–111)
Creatinine, Ser: 1.25 mg/dL — ABNORMAL HIGH (ref 0.44–1.00)
GFR, Estimated: 44 mL/min — ABNORMAL LOW (ref >60)
Glucose, Bld: 68 mg/dL — ABNORMAL LOW (ref 70–99)
Potassium: 4 mmol/L (ref 3.5–5.1)
Sodium: 138 mmol/L (ref 135–145)

## 2022-10-16 LAB — PHOSPHORUS: Phosphorus: 2.7 mg/dL (ref 2.5–4.6)

## 2022-10-16 LAB — CLOSTRIDIUM DIFFICILE BY PCR, REFLEXED: Toxigenic C. Difficile by PCR: POSITIVE — AB

## 2022-10-16 LAB — PROCALCITONIN: Procalcitonin: <0.1 ng/mL

## 2022-10-16 LAB — MAGNESIUM: Magnesium: 1.8 mg/dL (ref 1.7–2.4)

## 2022-10-16 MED ORDER — DILTIAZEM HCL ER COATED BEADS 120 MG PO CP24
120.0000 mg | ORAL_CAPSULE | Freq: Every day | ORAL | Status: DC
  Administered 2022-10-16 – 2022-10-20 (×4): 120 mg via ORAL
  Filled 2022-10-16 (×5): qty 1

## 2022-10-16 MED ORDER — CHLORHEXIDINE GLUCONATE CLOTH 2 % EX PADS
6.0000 | MEDICATED_PAD | Freq: Every day | CUTANEOUS | Status: DC
  Administered 2022-10-16 – 2022-10-20 (×5): 6 via TOPICAL

## 2022-10-16 MED ORDER — RISAQUAD PO CAPS
2.0000 | ORAL_CAPSULE | Freq: Three times a day (TID) | ORAL | Status: DC
  Administered 2022-10-16 – 2022-10-20 (×13): 2 via ORAL
  Filled 2022-10-16 (×12): qty 2

## 2022-10-16 MED ORDER — PROCHLORPERAZINE EDISYLATE 10 MG/2ML IJ SOLN
10.0000 mg | Freq: Once | INTRAMUSCULAR | Status: AC | PRN
  Administered 2022-10-16: 10 mg via INTRAVENOUS
  Filled 2022-10-16: qty 2

## 2022-10-16 MED ORDER — ONDANSETRON 4 MG PO TBDP
4.0000 mg | ORAL_TABLET | Freq: Three times a day (TID) | ORAL | Status: DC | PRN
  Administered 2022-10-17: 4 mg via ORAL
  Filled 2022-10-16: qty 1

## 2022-10-16 MED ORDER — FIDAXOMICIN 200 MG PO TABS
200.0000 mg | ORAL_TABLET | Freq: Two times a day (BID) | ORAL | 0 refills | Status: DC
  Filled 2022-10-16: qty 20, 10d supply, fill #0

## 2022-10-16 MED ORDER — SODIUM CHLORIDE 0.9% FLUSH
10.0000 mL | Freq: Two times a day (BID) | INTRAVENOUS | Status: DC
  Administered 2022-10-16 – 2022-10-19 (×6): 10 mL

## 2022-10-16 MED ORDER — GERHARDT'S BUTT CREAM
TOPICAL_CREAM | Freq: Three times a day (TID) | CUTANEOUS | Status: DC
  Filled 2022-10-16: qty 1

## 2022-10-16 MED ORDER — SODIUM CHLORIDE 0.9% FLUSH: 10.0000 mL | INTRAVENOUS | Status: DC | PRN

## 2022-10-16 MED ORDER — FIDAXOMICIN 200 MG PO TABS
200.0000 mg | ORAL_TABLET | Freq: Two times a day (BID) | ORAL | Status: DC
  Administered 2022-10-16 – 2022-10-20 (×9): 200 mg via ORAL
  Filled 2022-10-16 (×9): qty 1

## 2022-10-16 MED ORDER — BANATROL TF EN LIQD
60.0000 mL | Freq: Two times a day (BID) | ENTERAL | Status: DC
  Administered 2022-10-16 – 2022-10-19 (×6): 60 mL
  Filled 2022-10-16 (×9): qty 60

## 2022-10-16 MED ORDER — DEXTROSE-NACL 5-0.45 % IV SOLN: INTRAVENOUS | Status: AC

## 2022-10-16 MED ORDER — ADULT MULTIVITAMIN W/MINERALS CH
1.0000 | ORAL_TABLET | Freq: Every day | ORAL | Status: DC
  Administered 2022-10-16 – 2022-10-20 (×5): 1 via ORAL
  Filled 2022-10-16 (×5): qty 1

## 2022-10-16 NOTE — Progress Notes (Signed)
Initial Nutrition Assessment  DOCUMENTATION CODES:   Non-severe (moderate) malnutrition in context of acute illness/injury  INTERVENTION:  [place] yogurt with meals   Banatrol BID   Requested re-weight pending  NUTRITION DIAGNOSIS:   Moderate Malnutrition (inadequate oral intake) related to altered GI function (diarrhea x 3 weeks) as evidenced by per patient/family report, energy intake < 75% for > 7 days, mild muscle depletion, moderate muscle depletion (diarrhea x 3 weeks).   GOAL:  Patient will meet greater than or equal to 90% of their needs   MONITOR:  Diet advancement, PO intake, Supplement acceptance, Labs, Weight trends  REASON FOR ASSESSMENT:   Malnutrition Screening Tool    ASSESSMENT: Patient is a 81 yo female with chronic constipation, NASH liver cirrhosis, DM2, depression, GERD, persistent diarrhea the past 3 weeks. Clinically dehydrated at admission. Recent ED visit with diagnosis of colitis.   Patient is on soft diet. Her daughter brought in cream of potato soup. Patient has been eating mostly soups the past 2 weeks. She is agreeable to adding greek yogurt with meals. Patient complains that her dentures don't fit. Encouraged protein every meal. May be helpful to downgrade meats to ground to facilitate consumption.   Weights reviewed and current wt reported as 42.6 kg. Suspect this is incorrect. Contacted nursing and re-weight is being obtained. Estimated needs based on 54 kg. Patient has experienced weight loss over the past 5 months. Once re-weight is obtained will be able to define more clearly.   Medications: lipitor, synthroid, risaquid.      Latest Ref Rng & Units 10/16/2022    4:41 AM 10/15/2022   10:30 AM 10/05/2022    8:03 PM  BMP  Glucose 70 - 99 mg/dL 68  86  113   BUN 8 - 23 mg/dL 19  23  32   Creatinine 0.44 - 1.00 mg/dL 1.25  1.60  1.68   Sodium 135 - 145 mmol/L 138  136  139   Potassium 3.5 - 5.1 mmol/L 4.0  4.1  5.1   Chloride 98 - 111 mmol/L  112  108  109   CO2 22 - 32 mmol/L 21  19  24    Calcium 8.9 - 10.3 mg/dL 7.8  8.2  8.8       NUTRITION - FOCUSED PHYSICAL EXAM:  Flowsheet Row Most Recent Value  Orbital Region No depletion  Upper Arm Region No depletion  [edema noted lower forearms bilaterally]  Thoracic and Lumbar Region No depletion  Buccal Region No depletion  Temple Region Mild depletion  Clavicle Bone Region Moderate depletion  Clavicle and Acromion Bone Region Moderate depletion  Scapular Bone Region Mild depletion  Dorsal Hand Unable to assess  [edema]  Edema (RD Assessment) Mild  Hair Reviewed  Eyes Reviewed  Mouth Reviewed  Skin Reviewed  Nails Reviewed       Diet Order:   Diet Order             DIET SOFT Room service appropriate? Yes; Fluid consistency: Thin  Diet effective now                   EDUCATION NEEDS:  Education needs have been addressed  Skin:  Skin Assessment: Reviewed RN Assessment  Last BM:  11/9 type 6 large- rectal tube placed  Height:   Ht Readings from Last 1 Encounters:  10/15/22 5' 3"  (1.6 m)    Weight:   Wt Readings from Last 1 Encounters:  10/15/22 42.6 kg  Ideal Body Weight:   52 kg  BMI:  Body mass index is 16.65 kg/m.  Estimated Nutritional Needs:   Kcal:  1700-1800  Protein:  75-86 gr  Fluid:  >1600 ml daily  Colman Cater MS,RD,CSG,LDN Contact: Shea Evans

## 2022-10-16 NOTE — Plan of Care (Signed)
Pt alert and oriented x 4. Weakness noted. Pt had 3 large bms. MD Orlin Hilding contacted due to no order for cdiff panel but previous RN stated it needed to be obtain. Pt still having #6 stool. MD contacted for prn order for nausea. Compazine given and decreased nausea reported. Bp remains soft not meet parameters for notify MD Problem: Education: Goal: Knowledge of General Education information will improve Description: Including pain rating scale, medication(s)/side effects and non-pharmacologic comfort measures Outcome: Progressing   Problem: Health Behavior/Discharge Planning: Goal: Ability to manage health-related needs will improve Outcome: Progressing   Problem: Clinical Measurements: Goal: Ability to maintain clinical measurements within normal limits will improve Outcome: Progressing Goal: Will remain free from infection Outcome: Progressing Goal: Diagnostic test results will improve Outcome: Progressing Goal: Respiratory complications will improve Outcome: Progressing Goal: Cardiovascular complication will be avoided Outcome: Progressing   Problem: Activity: Goal: Risk for activity intolerance will decrease Outcome: Progressing   Problem: Nutrition: Goal: Adequate nutrition will be maintained Outcome: Progressing   Problem: Coping: Goal: Level of anxiety will decrease Outcome: Progressing   Problem: Elimination: Goal: Will not experience complications related to bowel motility Outcome: Progressing Goal: Will not experience complications related to urinary retention Outcome: Progressing   Problem: Pain Managment: Goal: General experience of comfort will improve Outcome: Progressing   Problem: Safety: Goal: Ability to remain free from injury will improve Outcome: Progressing   Problem: Skin Integrity: Goal: Risk for impaired skin integrity will decrease Outcome: Progressing

## 2022-10-16 NOTE — Progress Notes (Signed)
This nurse was called to pt's room d/t to c/o IV infiltration. Pt also has +2 edema to left forearm. MD made aware. Will continue to monitor pt.

## 2022-10-16 NOTE — Progress Notes (Signed)
Tele called to report increase of pt's HR in the 170's, and hasn't been below 140's. MD made aware. Will continue to monitor pt.

## 2022-10-16 NOTE — Progress Notes (Signed)
  Transition of Care [facility]) Screening Note   Patient Details  Name: Tasha Roberts Date of Birth: September 17, 1941   Transition of Care [facility]) CM/SW Contact:    Ihor Gully, LCSW Phone Number: 10/16/2022, 1:28 PM    Transition of Care Department [facility]) has reviewed patient and no TOC needs have been identified at this time. We will continue to monitor patient advancement through interdisciplinary progression rounds. If new patient transition needs arise, please place a TOC consult.

## 2022-10-16 NOTE — Progress Notes (Signed)
Peripherally Inserted Central Catheter Placement  The IV Nurse has discussed with the patient and/or persons authorized to consent for the patient, the purpose of this procedure and the potential benefits and risks involved with this procedure.  The benefits include less needle sticks, lab draws from the catheter, and the patient may be discharged home with the catheter. Risks include, but not limited to, infection, bleeding, blood clot (thrombus formation), and puncture of an artery; nerve damage and irregular heartbeat and possibility to perform a PICC exchange if needed/ordered by physician.  Alternatives to this procedure were also discussed.  Bard Power PICC patient education guide, fact sheet on infection prevention and patient information card has been provided to patient /or left at bedside.    PICC Placement Documentation  PICC Double Lumen 35/82/51 Right Basilic 36 cm 0 cm (Active)  Indication for Insertion or Continuance of Line Poor Vasculature-patient has had multiple peripheral attempts or PIVs lasting less than 24 hours 10/16/22 1800  Exposed Catheter (cm) 0 cm 10/16/22 1800  Site Assessment Clean, Dry, Intact 10/16/22 1800  Lumen #1 Status Flushed;Saline locked;Blood return noted 10/16/22 1800  Lumen #2 Status Flushed;Saline locked;Blood return noted 10/16/22 1800  Dressing Type Transparent;Securing device 10/16/22 1800  Dressing Status Antimicrobial disc in place;Clean, Dry, Intact 10/16/22 1800  Safety Lock Not Applicable 89/84/21 0312  Line Care Connections checked and tightened 10/16/22 1800  Dressing Intervention New dressing 10/16/22 1800  Dressing Change Due 10/23/22 10/16/22 1800       Darlyn Read 10/16/2022, 6:25 PM

## 2022-10-16 NOTE — Evaluation (Signed)
Physical Therapy Evaluation Patient Details Name: Tasha Roberts MRN: [id number] DOB: 08-29-1941 Today's Date: 10/16/2022  History of Present Illness  Tasha Roberts  is a 81 y.o. female, with medical history significant of chronic constipation, clotting disorder, depression, diabetes mellitus type 2, GERD, breast cancer, hyperlipidemia, hypertension, hypothyroidism, [place] liver cirrhosis, paroxysmal atrial fibrillation, [place] to ED secondary to complaints of nausea and diarrhea.  Patient reports chronic diarrhea over the last 3 weeks, she had an ED visit 2 weeks ago diagnosed with colitis, discharged on Augmentin, she completed the course at home, reports abdominal pain much improved, diarrhea has improved as well but persisted, she does report some nausea as well, denies vomiting, reports poor appetite as well specially had some issues with her dentures and cannot tolerate regular food, or diarrhea has improved but has been intermittent between watery and loose stool at this point, she feels she is dehydrated, reports she has been checking her blood pressure at home which has been recently low, reports she has been compliant with her Eliquis.  -In ED she was initially hypotensive, but she responded to fluid bolus, [place] exam was benign, so imaging was deferred, but she was clinically dehydrated, so [facility] hospitalist consulted to admit.   Clinical Impression  Patient requiring increased time with Min guard assist for sitting up at bedside, had to transfer to [facility] to have bowel movement due to diarrhea with good return demonstrated for using her rollator and limited to walking in room due to c/o fatigue and generalized weakness.  Patient tolerated sitting up in chair after therapy.  Patient will benefit from continued skilled physical therapy in hospital and recommended venue below to increase strength, balance, endurance for safe ADLs and gait.         Recommendations for follow up therapy are one component of  a multi-disciplinary discharge planning process, led by the attending physician.  Recommendations may be updated based on patient status, additional functional criteria and insurance authorization.  Follow Up Recommendations Home health PT      Assistance Recommended at Discharge Set up Supervision/Assistance  Patient can return home with the following  A little help with walking and/or transfers;A little help with bathing/dressing/bathroom;Help with stairs or ramp for entrance;Assistance with cooking/housework    Equipment Recommendations None recommended by PT  Recommendations for Other Services       Functional Status Assessment Patient has had a recent decline in their functional status and demonstrates the ability to make significant improvements in function in a reasonable and predictable amount of time.     Precautions / Restrictions Precautions Precautions: Fall Restrictions Weight Bearing Restrictions: No      Mobility  Bed Mobility Overal bed mobility: Needs Assistance Bed Mobility: Supine to Sit     Supine to sit: Supervision, Min guard, HOB elevated     General bed mobility comments: increased time, labored movement with HOB raised    Transfers Overall transfer level: Needs assistance Equipment used: Rolling walker (2 wheels) Transfers: Sit to/from Stand, Bed to chair/wheelchair/BSC Sit to Stand: Min guard   Step pivot transfers: Min guard       General transfer comment: slow labored movement    Ambulation/Gait Ambulation/Gait assistance: Min guard, Min assist Gait Distance (Feet): 20 Feet Assistive device: Rollator (4 wheels) Gait Pattern/deviations: Decreased step length - right, Decreased step length - left, Decreased stride length, Trunk flexed Gait velocity: decreased     General Gait Details: limited to walking to doorway and back to bedside with slow  labored movement without loss of balance, limited mostly due to faigue and generalized  weakness  Stairs            Wheelchair Mobility    Modified Rankin (Stroke Patients Only)       Balance Overall balance assessment: Needs assistance Sitting-balance support: Feet supported, No upper extremity supported Sitting balance-Leahy Scale: Fair Sitting balance - Comments: fair/good seated at EOB   Standing balance support: During functional activity, Bilateral upper extremity supported Standing balance-Leahy Scale: Fair Standing balance comment: using RW                             Pertinent Vitals/Pain Pain Assessment Pain Assessment: No/denies pain    Home Living Family/patient expects to be discharged to:: Private residence Living Arrangements: Children Available Help at Discharge: Family Type of Home: Mobile home Home Access: Stairs to enter Entrance Stairs-Rails: Right;Left;Can reach both Entrance Stairs-Number of Steps: 4   Home Layout: One level Home Equipment: Conservation officer, nature (2 wheels)      Prior Function Prior Level of Function : Independent/Modified Independent             Mobility Comments: household Geologist, engineering ADLs Comments: assisted by family     Hand Dominance        Extremity/Trunk Assessment   Upper Extremity Assessment Upper Extremity Assessment: Generalized weakness    Lower Extremity Assessment Lower Extremity Assessment: Generalized weakness    Cervical / Trunk Assessment Cervical / Trunk Assessment: Normal  Communication   Communication: No difficulties  Cognition Arousal/Alertness: Awake/alert Behavior During Therapy: WFL for tasks assessed/performed Overall Cognitive Status: Within Functional Limits for tasks assessed                                          General Comments      Exercises     Assessment/Plan    PT Assessment Patient needs continued PT services  PT Problem List Decreased strength;Decreased activity tolerance;Decreased balance;Decreased  mobility       PT Treatment Interventions DME instruction;Gait training;Stair training;Functional mobility training;Therapeutic activities;Therapeutic exercise;Patient/family education;Balance training    PT Goals (Current goals can be found in the Care Plan section)  Acute Rehab PT Goals Patient Stated Goal: return home with family to assist PT Goal Formulation: With patient Time For Goal Achievement: 10/23/22 Potential to Achieve Goals: Good    Frequency Min 3X/week     Co-evaluation               AM-PAC PT "6 Clicks" Mobility  Outcome Measure Help needed turning from your back to your side while in a flat bed without using bedrails?: None Help needed moving from lying on your back to sitting on the side of a flat bed without using bedrails?: A Little Help needed moving to and from a bed to a chair (including a wheelchair)?: A Little Help needed standing up from a chair using your arms (e.g., wheelchair or bedside chair)?: A Little Help needed to walk in hospital room?: A Little Help needed climbing 3-5 steps with a railing? : A Lot 6 Click Score: 18    End of Session   Activity Tolerance: Patient tolerated treatment well;Patient limited by fatigue Patient left: in chair;with call bell/phone within reach Nurse Communication: Mobility status PT Visit Diagnosis: Unsteadiness on feet (R26.81);Other abnormalities of gait and  mobility (R26.89);Muscle weakness (generalized) (M62.81)    Time: 1694-5038 PT Time Calculation (min) (ACUTE ONLY): 24 min   Charges:   PT Evaluation $PT Eval Moderate Complexity: 1 Mod PT Treatments $Therapeutic Activity: 23-37 mins        11:26 AM, 10/16/22 Lonell Grandchild, MPT Physical Therapist with [facility] [phone] office [phone] mobile phone

## 2022-10-16 NOTE — Care Management Obs Status (Signed)
[facility] NOTIFICATION   Patient Details  Name: Tasha Roberts MRN: [id number] Date of Birth: 1941/03/06   Medicare Observation Status Notification Given:  Yes (copy mailed to address on file)    Tommy Medal 10/16/2022, 9:07 AM

## 2022-10-16 NOTE — Progress Notes (Signed)
PROGRESS NOTE    Patient: Tasha Roberts                            PCP: Lindell Spar, MD                    DOB: 03/27/1941            DOA: 10/15/2022 [id number]             DOS: 10/16/2022, 11:42 AM   LOS: 0 days   Date of Service: The patient was seen and examined on 10/16/2022  Subjective:   The patient was seen and examined this morning. Stable at this time. Still complaining of Diarrhea Otherwise no issues overnight .  Brief Narrative:   Tasha Roberts  is a 81 y.o. female, with medical history significant of chronic constipation, clotting disorder, depression, diabetes mellitus type 2, GERD, breast cancer, hyperlipidemia, hypertension, hypothyroidism, Nash liver cirrhosis, paroxysmal atrial fibrillation, [facility] to ED secondary to complaints of nausea and diarrhea.  Patient reports chronic diarrhea over the last 3 weeks, she had an ED visit 2 weeks ago diagnosed with colitis, discharged on Augmentin, she completed the course at home, reports abdominal pain much improved, diarrhea has improved as well but persisted, she does report some nausea as well, denies vomiting, reports poor appetite as well specially had some issues with her dentures and cannot tolerate regular food, or diarrhea has improved but has been intermittent between watery and loose stool at this point, she feels she is dehydrated, reports she has been checking her blood pressure at home which has been recently low, reports she has been compliant with her Eliquis.  -In ED she was initially hypotensive, but she responded to fluid bolus,exam was benign, so imaging was deferred, but she was clinically dehydrated, so [facility] hospitalist consulted to admit.      Assessment & Plan:     Principal Problem:   Diarrhea Active Problems:   AF (paroxysmal atrial fibrillation) (HCC)   Acquired hypothyroidism   Essential hypertension   Mixed hyperlipidemia   Chronic anemia   CAD (coronary artery disease)   Chronic  diarrhea  Hypotension/ Dehydration/due to diarrhea  -Patient presents with poor oral intake, diarrhea, cynically dehydrated -She responded to IV fluids initially, continue with IV fluids and monitor overnight. - Random cortisol level 22.1  - Procalcitonin level <0.10  -Hold on IV antibiotics for now and continue to monitor clinically -Abdomen exam is benign, will hold on repeat imaging, had CT abdomen pelvis 10/29 significant for colitis/enteritis   -Will start on low-dose midodrine till she is appropriately IV hydrated.  C. difficile colitis/diarrhea - ++  C. difficile -  GI panel -Status post IV fluid hydration -We will continue to monitor diarrhea and further hydration as needed -We will start the patient on oral medication of DIFCID 200 mg twice daily x10 days (Asked the pharmacy to check if her insurance covers it)     Paroxysmal A-fib -Heart rate controlled  -Continue with Eliquis for anticoagulation  we will hold Cardizem for now given hypotension on presentation, and if needed might give as needed digoxin (will use very carefully given her CKD)    CKD stage IIIb -Renal function at baseline, continue with IV fluids.  Hyperlipidemia  - continue with home dose statin   GERD -Continue with PPI   Hypothyroidism -Continue with Synthroid   Hypertension  - Her blood pressure is actually low,  so we will hold home meds        ----------------------------------------------------------------------------------------------------------------------------------------------- Nutritional status:  The patient's BMI is: Body mass index is 16.65 kg/m. I agree with the assessment and plan as outlined ------------------------------------------------------------------------------------------------------------------------------------------------  DVT prophylaxis:  apixaban (ELIQUIS) tablet 2.5 mg Start: 10/15/22 2200 apixaban (ELIQUIS) tablet 2.5 mg   Code Status:   Code Status:  Full Code  Family Communication: No family member present at bedside- attempt will be made to update daily The above findings and plan of care has been discussed with patient (and family)  in detail,  they expressed understanding and agreement of above. -Advance care planning has been discussed.   Admission status:   Status is: Observation The patient remains OBS appropriate and will d/c before 2 midnights.     Procedures:   No admission procedures for hospital encounter.   Antimicrobials:  Anti-infectives (From admission, onward)    Start     Dose/Rate Route Frequency Ordered Stop   10/16/22 1000  fidaxomicin (DIFICID) tablet 200 mg        200 mg Oral 2 times daily 10/16/22 0736 10/26/22 0959   10/16/22 0000  fidaxomicin (DIFICID) 200 MG TABS tablet       Note to Pharmacy: Patient at [facility] - please deliver on 11/10.  Copay should be about $50 after coupon automatically applied.  Call [phone] for questions. JFrens   200 mg Oral 2 times daily 10/16/22 1135 10/26/22 2359        Medication:   acidophilus  2 capsule Oral TID   apixaban  2.5 mg Oral BID   atorvastatin  20 mg Oral Daily   fidaxomicin  200 mg Oral BID   influenza vaccine adjuvanted  0.5 mL Intramuscular Tomorrow-1000   levothyroxine  100 mcg Oral QAC breakfast   midodrine  5 mg Oral TID WC    acetaminophen **OR** acetaminophen   Objective:   Vitals:   10/15/22 1846 10/15/22 2035 10/15/22 2354 10/16/22 0324  BP: 116/60 (!) 92/56 (!) 99/49 (!) 95/46  Pulse: 88 67 83 85  Resp: 19 17 14 16   Temp: 97.8 F (36.6 C) (!) 97.2 F (36.2 C) 97.9 F (36.6 C) 97.8 F (36.6 C)  TempSrc: Oral Oral    SpO2: 94% 92% 100% 93%  Weight:      Height:        Intake/Output Summary (Last 24 hours) at 10/16/2022 1142 Last data filed at 10/16/2022 0800 Gross per 24 hour  Intake 2572 ml  Output 300 ml  Net 2272 ml   Filed Weights   10/15/22 1217  Weight: 42.6 kg     Examination:   Physical Exam   Constitution:  Alert, cooperative, no distress,  Appears calm and comfortable  Psychiatric:   Normal and stable mood and affect, cognition intact,   HEENT:        Normocephalic, PERRL, otherwise with in Normal limits  Chest:         Chest symmetric Cardio vascular:  S1/S2, RRR, No murmure, No Rubs or Gallops  pulmonary: Clear to auscultation bilaterally, respirations unlabored, negative wheezes / crackles Abdomen: Soft, non-tender, non-distended, bowel sounds,no masses, no organomegaly Muscular skeletal: Limited exam - in bed, able to move all 4 extremities,   Neuro: CNII-XII intact. , normal motor and sensation, reflexes intact  Extremities: No pitting edema lower extremities, +2 pulses  Skin: Dry, warm to touch, negative for any Rashes, No open wounds Wounds: per nursing documentation   ------------------------------------------------------------------------------------------------------------------------------------------  LABs:     Latest Ref Rng & Units 10/16/2022    4:41 AM 10/15/2022   10:30 AM 10/05/2022    8:03 PM  CBC  WBC 4.0 - 10.5 K/uL 4.5  7.5  8.9   Hemoglobin 12.0 - 15.0 g/dL 9.3  11.3  10.9   Hematocrit 36.0 - 46.0 % 27.7  35.0  33.1   Platelets 150 - 400 K/uL 100  176  120       Latest Ref Rng & Units 10/16/2022    4:41 AM 10/15/2022   10:30 AM 10/05/2022    8:03 PM  CMP  Glucose 70 - 99 mg/dL 68  86  113   BUN 8 - 23 mg/dL 19  23  32   Creatinine 0.44 - 1.00 mg/dL 1.25  1.60  1.68   Sodium 135 - 145 mmol/L 138  136  139   Potassium 3.5 - 5.1 mmol/L 4.0  4.1  5.1   Chloride 98 - 111 mmol/L 112  108  109   CO2 22 - 32 mmol/L 21  19  24    Calcium 8.9 - 10.3 mg/dL 7.8  8.2  8.8   Total Protein 6.5 - 8.1 g/dL  5.2  5.1   Total Bilirubin 0.3 - 1.2 mg/dL  1.1  0.8   Alkaline Phos 38 - 126 U/L  94  130   AST 15 - 41 U/L  39  47   ALT 0 - 44 U/L  21  28        Micro Results Recent Results (from the past 240 hour(s))  C Difficile Quick Screen w PCR  reflex     Status: Abnormal   Collection Time: 10/15/22  6:15 PM   Specimen: STOOL  Result Value Ref Range Status   C Diff antigen POSITIVE (A) NEGATIVE Final   C Diff toxin NEGATIVE NEGATIVE Final   C Diff interpretation Results are indeterminate. See PCR results.  Final    Comment: Performed at [facility], [address] [place], [place] [address]  Gastrointestinal Panel by PCR , Stool     Status: None   Collection Time: 10/15/22  6:15 PM   Specimen: STOOL  Result Value Ref Range Status   Campylobacter species NOT DETECTED NOT DETECTED Final   Plesimonas shigelloides NOT DETECTED NOT DETECTED Final   Salmonella species NOT DETECTED NOT DETECTED Final   Yersinia enterocolitica NOT DETECTED NOT DETECTED Final   Vibrio species NOT DETECTED NOT DETECTED Final   Vibrio cholerae NOT DETECTED NOT DETECTED Final   Enteroaggregative E coli (EAEC) NOT DETECTED NOT DETECTED Final   Enteropathogenic E coli (EPEC) NOT DETECTED NOT DETECTED Final   Enterotoxigenic E coli (ETEC) NOT DETECTED NOT DETECTED Final   Shiga like toxin producing E coli (STEC) NOT DETECTED NOT DETECTED Final   Shigella/Enteroinvasive E coli (EIEC) NOT DETECTED NOT DETECTED Final   Cryptosporidium NOT DETECTED NOT DETECTED Final   Cyclospora cayetanensis NOT DETECTED NOT DETECTED Final   Entamoeba histolytica NOT DETECTED NOT DETECTED Final   Giardia lamblia NOT DETECTED NOT DETECTED Final   Adenovirus F40/41 NOT DETECTED NOT DETECTED Final   Astrovirus NOT DETECTED NOT DETECTED Final   Norovirus GI/GII NOT DETECTED NOT DETECTED Final   Rotavirus A NOT DETECTED NOT DETECTED Final   Sapovirus (I, II, IV, and V) NOT DETECTED NOT DETECTED Final    Comment: Performed at [facility], [address] [place], [place] [address]  C. Diff by PCR,  Reflexed     Status: Abnormal   Collection Time: 10/15/22  6:15 PM  Result Value Ref Range Status   Toxigenic C. Difficile by PCR POSITIVE (A) NEGATIVE Final    Comment:  Positive for toxigenic C. difficile with little to no toxin production. Only treat if clinical presentation suggests symptomatic illness. Performed at [facility], [place] [address], [place], [place] [address]     Radiology Reports No results found.  SIGNED: Deatra James, MD, FHM. [facility],  Pager (please use [email or web address] to page/text) Please use Epic Secure Chat for non-urgent communication (7AM-7PM)  If 7PM-7AM, please contact night-coverage [email or web address], 10/16/2022, 11:42 AM

## 2022-10-16 NOTE — Hospital Course (Addendum)
Tasha Roberts  is a 81 y.o. female, with medical history significant of chronic constipation, clotting disorder, depression, diabetes mellitus type 2, GERD, breast cancer, hyperlipidemia, hypertension, hypothyroidism, Nash liver cirrhosis, paroxysmal atrial fibrillation, [facility] to ED secondary to complaints of nausea and diarrhea.  Patient reports chronic diarrhea over the last 3 weeks, she had an ED visit 2 weeks ago diagnosed with colitis, discharged on Augmentin, she completed the course at home, reports abdominal pain much improved, diarrhea has improved as well but persisted, she does report some nausea as well, denies vomiting, reports poor appetite as well specially had some issues with her dentures and cannot tolerate regular food, or diarrhea has improved but has been intermittent between watery and loose stool at this point, she feels she is dehydrated, reports she has been checking her blood pressure at home which has been recently low, reports she has been compliant with her Eliquis.  -In ED she was initially hypotensive, but she responded to fluid bolus,exam was benign, so imaging was deferred, but she was clinically dehydrated, so [facility] hospitalist consulted to admit.      Assessment & Plan:     Principal Problem:   Diarrhea Active Problems:   AF (paroxysmal atrial fibrillation) (HCC)   Acquired hypothyroidism   Essential hypertension   Mixed hyperlipidemia   Chronic anemia   CAD (coronary artery disease)   Chronic diarrhea  Hypotension/ Dehydration/due to diarrhea  -Still mildly hypotensive, blood pressure 103/60, but has stabilized -Continue gentle IV fluid hydration, with anticipation discontinuation due to patient's edema -Repleting albumin  -Patient presents with poor oral intake, diarrhea, cynically dehydrated  - Random cortisol level 22.1  - Procalcitonin level <0.10  -Abdomen exam is benign, will hold on repeat imaging, had CT abdomen pelvis 10/29 significant for  colitis/enteritis   -Will start on low-dose midodrine till she is appropriately IV hydrated.  C. difficile colitis/diarrhea - ++  C. difficile -  GI panel -Status post IV fluid hydration -We will continue to monitor diarrhea and further hydration as needed -We will start the patient on oral medication of DIFCID 200 mg twice daily x10 days (Asked the pharmacy to check if her insurance covers it)     Paroxysmal A-fib -with tachycardia -Was tachycardic -Continue with Eliquis for anticoagulation  -Resuming home medication of diltiazem CD -Monitoring for hypotension and, A on CKD      CKD stage IIIb -Renal function at baseline, was on IV fluids.  Hyperlipidemia  - continue with home dose statin   GERD -Continue with PPI   Hypothyroidism -Continue with Synthroid   Hypertension  - Her blood pressure is actually low, so we will hold home meds  Moderate malnutrition Body mass index is 16.65 kg/m. Dietitian consult, continue dietary supplements   Chronic anemia of iron deficiency Exacerbated by dietary malnutrition-  H&H remained stable, Continue treating underlying cause of malnutrition

## 2022-10-16 NOTE — Plan of Care (Signed)
  Problem: Acute Rehab PT Goals(only PT should resolve) Goal: Pt Will Go Supine/Side To Sit Outcome: Progressing Flowsheets (Taken 10/16/2022 1127) Pt will go Supine/Side to Sit:  with modified independence  with supervision Goal: Patient Will Transfer Sit To/From Stand Outcome: Progressing Flowsheets (Taken 10/16/2022 1127) Patient will transfer sit to/from stand:  with modified independence  with supervision Goal: Pt Will Transfer Bed To Chair/Chair To Bed Outcome: Progressing Flowsheets (Taken 10/16/2022 1127) Pt will Transfer Bed to Chair/Chair to Bed:  with modified independence  with supervision Goal: Pt Will Ambulate Outcome: Progressing Flowsheets (Taken 10/16/2022 1127) Pt will Ambulate:  50 feet  with supervision  with min guard assist  with rolling walker   11:28 AM, 10/16/22 Lonell Grandchild, MPT Physical Therapist with [facility] [phone] office [phone] mobile phone

## 2022-10-16 NOTE — TOC Benefit Eligibility Note (Signed)
Patient Teacher, English as a foreign language completed.    The patient is currently admitted and upon discharge could be taking Dificid 200 mg tablets.  The current 10 day co-pay is $1,345.67.   The patient is insured through [place], [place] Patient [place] Patient Advocate Team Direct Number: [phone]  Fax: [phone]

## 2022-10-16 NOTE — Telephone Encounter (Signed)
Pharmacy Patient Advocate Encounter  Insurance verification completed.    The patient is insured through [employer]   The patient is currently admitted and ran test claims for the following: Dificid.  Copays and coinsurance results were relayed to Inpatient clinical team.

## 2022-10-17 ENCOUNTER — Other Ambulatory Visit (HOSPITAL_COMMUNITY): Payer: Self-pay

## 2022-10-17 DIAGNOSIS — A09 Infectious gastroenteritis and colitis, unspecified: Secondary | ICD-10-CM | POA: Diagnosis not present

## 2022-10-17 LAB — PROCALCITONIN: Procalcitonin: <0.1 ng/mL

## 2022-10-17 MED ORDER — ALBUMIN HUMAN 5 % IV SOLN
25.0000 g | Freq: Once | INTRAVENOUS | Status: DC
  Filled 2022-10-17: qty 500

## 2022-10-17 MED ORDER — ALBUMIN HUMAN 25 % IV SOLN
25.0000 g | Freq: Once | INTRAVENOUS | Status: AC
  Administered 2022-10-17: 25 g via INTRAVENOUS
  Filled 2022-10-17: qty 100

## 2022-10-17 MED ORDER — DEXTROSE-NACL 5-0.45 % IV SOLN: INTRAVENOUS | Status: AC

## 2022-10-17 NOTE — Progress Notes (Signed)
PROGRESS NOTE    Patient: Tasha Roberts                            PCP: Lindell Spar, MD                    DOB: 12/25/1940            DOA: 10/15/2022 NAT:[id number]             DOS: 10/17/2022, 11:20 AM   LOS: 1 day   Date of Service: The patient was seen and examined on 10/17/2022  Subjective:   The patient was seen and examined this morning. Tachycardic with heart rate of 108, mildly hypertensive BP 103/60, satting 90% on 2 L of oxygen Otherwise awake alert oriented, following command, rectal tube in place, x5 liquid stool overnight   Brief Narrative:   Tasha Roberts  is a 81 y.o. female, with medical history significant of chronic constipation, clotting disorder, depression, diabetes mellitus type 2, GERD, breast cancer, hyperlipidemia, hypertension, hypothyroidism, Nash liver cirrhosis, paroxysmal atrial fibrillation, [facility] to ED secondary to complaints of nausea and diarrhea.  Patient reports chronic diarrhea over the last 3 weeks, she had an ED visit 2 weeks ago diagnosed with colitis, discharged on Augmentin, she completed the course at home, reports abdominal pain much improved, diarrhea has improved as well but persisted, she does report some nausea as well, denies vomiting, reports poor appetite as well specially had some issues with her dentures and cannot tolerate regular food, or diarrhea has improved but has been intermittent between watery and loose stool at this point, she feels she is dehydrated, reports she has been checking her blood pressure at home which has been recently low, reports she has been compliant with her Eliquis.  -In ED she was initially hypotensive, but she responded to fluid bolus,exam was benign, so imaging was deferred, but she was clinically dehydrated, so [facility] hospitalist consulted to admit.      Assessment & Plan:     Principal Problem:   Diarrhea Active Problems:   AF (paroxysmal atrial fibrillation) (HCC)   Acquired hypothyroidism    Essential hypertension   Mixed hyperlipidemia   Chronic anemia   CAD (coronary artery disease)   Chronic diarrhea  Hypotension/ Dehydration/due to diarrhea  -Still mildly hypotensive, blood pressure 103/60, but has stabilized -Continue gentle IV fluid hydration, with anticipation discontinuation due to patient's edema -Repleting albumin  -Patient presents with poor oral intake, diarrhea, cynically dehydrated  - Random cortisol level 22.1  - Procalcitonin level <0.10  -Abdomen exam is benign, will hold on repeat imaging, had CT abdomen pelvis 10/29 significant for colitis/enteritis   -Will start on low-dose midodrine till she is appropriately IV hydrated.  C. difficile colitis/diarrhea - ++  C. difficile -  GI panel -Status post IV fluid hydration -We will continue to monitor diarrhea and further hydration as needed -We will start the patient on oral medication of DIFCID 200 mg twice daily x10 days (Asked the pharmacy to check if her insurance covers it)     Paroxysmal A-fib -with tachycardia -Was tachycardic -Continue with Eliquis for anticoagulation  -Resuming home medication of diltiazem CD -Monitoring for hypotension and, A on CKD      CKD stage IIIb -Renal function at baseline, was on IV fluids.  Hyperlipidemia  - continue with home dose statin   GERD -Continue with PPI   Hypothyroidism -Continue with Synthroid  Hypertension  - Her blood pressure is actually low, so we will hold home meds  Moderate malnutrition Body mass index is 16.65 kg/m. Dietitian consult, continue dietary supplements   Chronic anemia of iron deficiency Exacerbated by dietary malnutrition-  H&H remained stable, Continue treating underlying cause of malnutrition     --------------------------------------------------------------------------------------------------------------------------------------- Nutritional status:  The patient's BMI is: Body mass index is 16.65 kg/m. I  agree with the assessment and plan as outlined below: Nutrition Status: Nutrition Problem: Moderate Malnutrition (inadequate oral intake) Etiology: altered GI function (diarrhea x 3 weeks) Signs/Symptoms: per patient/family report, energy intake < 75% for > 7 days, mild muscle depletion, moderate muscle depletion (diarrhea x 3 weeks) Interventions: MVI (Greek yogurt, Banatrol BID)     Skin Assessment: I have examined the patient's skin and I agree with the wound assessment as performed by wound care team As outlined -----------------------------------------------------------------------------------------------------------------------------------------  DVT prophylaxis:  apixaban (ELIQUIS) tablet 2.5 mg Start: 10/15/22 2200 apixaban (ELIQUIS) tablet 2.5 mg   Code Status:   Code Status: Full Code  Family Communication: No family member present at bedside- attempt will be made to update daily The above findings and plan of care has been discussed with patient (and family)  in detail,  they expressed understanding and agreement of above. -Advance care planning has been discussed.   Admission status:   Status is: Inpatient Remains inpatient appropriate because: Needing IV fluids, monitoring profuse diarrhea Treating severe dehydration, hypotension,     Procedures:   No admission procedures for hospital encounter.   Antimicrobials:  Anti-infectives (From admission, onward)    Start     Dose/Rate Route Frequency Ordered Stop   10/16/22 1000  fidaxomicin (DIFICID) tablet 200 mg        200 mg Oral 2 times daily 10/16/22 0736 10/26/22 0959   10/16/22 0000  fidaxomicin (DIFICID) 200 MG TABS tablet       Note to Pharmacy: Patient at [facility] - please deliver on 11/10.  Copay should be about $50 after coupon automatically applied.  Call [phone] for questions. JFrens   200 mg Oral 2 times daily 10/16/22 1135          Medication:   acidophilus  2 capsule Oral TID   apixaban   2.5 mg Oral BID   atorvastatin  20 mg Oral Daily   Chlorhexidine Gluconate Cloth  6 each Topical Daily   diltiazem  120 mg Oral Daily   fiber supplement (BANATROL TF)  60 mL Per Tube BID   fidaxomicin  200 mg Oral BID   Gerhardt's butt cream   Topical TID   influenza vaccine adjuvanted  0.5 mL Intramuscular Tomorrow-1000   levothyroxine  100 mcg Oral QAC breakfast   midodrine  5 mg Oral TID WC   multivitamin with minerals  1 tablet Oral Daily   sodium chloride flush  10-40 mL Intracatheter Q12H    acetaminophen **OR** acetaminophen, ondansetron, sodium chloride flush   Objective:   Vitals:   10/16/22 0324 10/16/22 1330 10/16/22 2123 10/17/22 0526  BP: (!) 95/46 (!) 100/51 112/64 103/60  Pulse: 85 90 88 (!) 108  Resp: 16 16 17 18   Temp: 97.8 F (36.6 C) 98.4 F (36.9 C) 98.1 F (36.7 C) (!) 97.4 F (36.3 C)  TempSrc:  Oral  Oral  SpO2: 93% 98% 97% 93%  Weight:      Height:        Intake/Output Summary (Last 24 hours) at 10/17/2022 1120 Last data filed at 10/17/2022  0500 Gross per 24 hour  Intake 480 ml  Output 435 ml  Net 45 ml   Filed Weights   10/15/22 1217  Weight: 42.6 kg     Examination:   Physical Exam  Constitution:  Alert, cooperative, no distress, diffuse cachexia Psychiatric:   Normal and stable mood and affect, cognition intact,   HEENT:        Normocephalic, PERRL, otherwise with in Normal limits  Chest:         Chest symmetric Cardio vascular:  S1/S2, RRR, No murmure, No Rubs or Gallops  pulmonary: Clear to auscultation bilaterally, respirations unlabored, negative wheezes / crackles Abdomen: Soft, non-tender, non-distended, bowel sounds,no masses, no organomegaly Muscular skeletal: Diffuse muscle wasting Limited exam - in bed, able to move all 4 extremities,   Neuro: CNII-XII intact. , normal motor and sensation, reflexes intact  Extremities: No pitting edema lower extremities, +2 pulses  Skin: Dry, warm to touch, negative for any Rashes, No  open wounds Wounds: per nursing documentation Rectal tube in place Left PICC line upper arm  ------------------------------------------------------------------------------------------------------------------------------------------    LABs:     Latest Ref Rng & Units 10/16/2022    4:41 AM 10/15/2022   10:30 AM 10/05/2022    8:03 PM  CBC  WBC 4.0 - 10.5 K/uL 4.5  7.5  8.9   Hemoglobin 12.0 - 15.0 g/dL 9.3  11.3  10.9   Hematocrit 36.0 - 46.0 % 27.7  35.0  33.1   Platelets 150 - 400 K/uL 100  176  120       Latest Ref Rng & Units 10/16/2022    4:41 AM 10/15/2022   10:30 AM 10/05/2022    8:03 PM  CMP  Glucose 70 - 99 mg/dL 68  86  113   BUN 8 - 23 mg/dL 19  23  32   Creatinine 0.44 - 1.00 mg/dL 1.25  1.60  1.68   Sodium 135 - 145 mmol/L 138  136  139   Potassium 3.5 - 5.1 mmol/L 4.0  4.1  5.1   Chloride 98 - 111 mmol/L 112  108  109   CO2 22 - 32 mmol/L 21  19  24    Calcium 8.9 - 10.3 mg/dL 7.8  8.2  8.8   Total Protein 6.5 - 8.1 g/dL  5.2  5.1   Total Bilirubin 0.3 - 1.2 mg/dL  1.1  0.8   Alkaline Phos 38 - 126 U/L  94  130   AST 15 - 41 U/L  39  47   ALT 0 - 44 U/L  21  28        Micro Results Recent Results (from the past 240 hour(s))  C Difficile Quick Screen w PCR reflex     Status: Abnormal   Collection Time: 10/15/22  6:15 PM   Specimen: STOOL  Result Value Ref Range Status   C Diff antigen POSITIVE (A) NEGATIVE Final   C Diff toxin NEGATIVE NEGATIVE Final   C Diff interpretation Results are indeterminate. See PCR results.  Final    Comment: Performed at [facility], [address] [place], [place] [address]  Gastrointestinal Panel by PCR , Stool     Status: None   Collection Time: 10/15/22  6:15 PM   Specimen: STOOL  Result Value Ref Range Status   Campylobacter species NOT DETECTED NOT DETECTED Final   Plesimonas shigelloides NOT DETECTED NOT DETECTED Final   Salmonella species NOT DETECTED NOT DETECTED Final  Yersinia enterocolitica NOT DETECTED NOT  DETECTED Final   Vibrio species NOT DETECTED NOT DETECTED Final   Vibrio cholerae NOT DETECTED NOT DETECTED Final   Enteroaggregative E coli (EAEC) NOT DETECTED NOT DETECTED Final   Enteropathogenic E coli (EPEC) NOT DETECTED NOT DETECTED Final   Enterotoxigenic E coli (ETEC) NOT DETECTED NOT DETECTED Final   Shiga like toxin producing E coli (STEC) NOT DETECTED NOT DETECTED Final   Shigella/Enteroinvasive E coli (EIEC) NOT DETECTED NOT DETECTED Final   Cryptosporidium NOT DETECTED NOT DETECTED Final   Cyclospora cayetanensis NOT DETECTED NOT DETECTED Final   Entamoeba histolytica NOT DETECTED NOT DETECTED Final   Giardia lamblia NOT DETECTED NOT DETECTED Final   Adenovirus F40/41 NOT DETECTED NOT DETECTED Final   Astrovirus NOT DETECTED NOT DETECTED Final   Norovirus GI/GII NOT DETECTED NOT DETECTED Final   Rotavirus A NOT DETECTED NOT DETECTED Final   Sapovirus (I, II, IV, and V) NOT DETECTED NOT DETECTED Final    Comment: Performed at [facility], [address] [place], [place] [address]  C. Diff by PCR, Reflexed     Status: Abnormal   Collection Time: 10/15/22  6:15 PM  Result Value Ref Range Status   Toxigenic C. Difficile by PCR POSITIVE (A) NEGATIVE Final    Comment: Positive for toxigenic C. difficile with little to no toxin production. Only treat if clinical presentation suggests symptomatic illness. Performed at [facility], [place] [address], [place], [place] [address]     Radiology Reports DG CHEST PORT 1 VIEW  Result Date: 10/16/2022 CLINICAL DATA:  PICC placement. EXAM: PORTABLE CHEST 1 VIEW COMPARISON:  09/29/2022, additional priors reviewed. FINDINGS: Tip of the right upper extremity PICC overlies the SVC. There is no pneumothorax. Stable heart size and mediastinal contours. Small left pleural effusion with ill-defined opacity at the left lung base. Minimal ill-defined opacity in the right upper lung zone may represent overlap of the first rib costal  cartilage and have been present on prior exams. No pulmonary edema. The bones are under mineralized IMPRESSION: 1. Tip of the right upper extremity PICC overlies the SVC. No pneumothorax. 2. Small left pleural effusion with ill-defined left lung base opacity, may represent atelectasis or pneumonia. Electronically Signed   By: Keith Rake M.D.   On: 10/16/2022 19:14   [place] EKG SITE RITE  Result Date: 10/16/2022 If Site Rite image not attached, placement could not be confirmed due to current cardiac rhythm.   SIGNED: Deatra James, MD, FHM. [facility],  Pager (please use [email or web address] to page/text) Please use Epic Secure Chat for non-urgent communication (7AM-7PM)  If 7PM-7AM, please contact night-coverage [email or web address], 10/17/2022, 11:20 AM

## 2022-10-17 NOTE — Progress Notes (Signed)
Telemetry called this nurse and stated pt has a 2.38 pause. Notified MD.

## 2022-10-17 NOTE — TOC Initial Note (Signed)
Transition of Care [facility]) - Initial/Assessment Note    Patient Details  Name: Tasha Roberts MRN: [id number] Date of Birth: [birth year]  Transition of Care [facility]) CM/SW Contact:    Ihor Gully, LCSW Phone Number: 10/17/2022, 12:51 PM  Clinical Narrative:                 Patient from home with daughter, [name]. Considered high risk for readmission. Admitted for diarrhea, Cdiff+. Has rollator, hospital bed, recliner, and bsc in the home. Daughter takes to appointments. Active with [facility] RN. PT recommends HHPT. Daughter is agreeable to continue services with [facility] for RN/PT at d/c.   Expected Discharge Plan: [place] Barriers to Discharge: Continued Medical Work up   Patient Goals and CMS Choice Patient states their goals for this hospitalization and ongoing recovery are:: return home      Expected Discharge Plan and Services Expected Discharge Plan: [place]                                              Prior Living Arrangements/Services   Lives with:: Adult Children Patient language and need for interpreter reviewed:: Yes Do you feel safe going back to the place where you live?: Yes      Need for Family Participation in Patient Care: Yes (Comment) Care giver support system in place?: Yes (comment) Current home services: DME, Home RN Criminal Activity/Legal Involvement Pertinent to Current Situation/Hospitalization: No - Comment as needed  Activities of Daily Living Home Assistive Devices/Equipment: Walker (specify type) ADL Screening (condition at time of admission) Patient's cognitive ability adequate to safely complete daily activities?: Yes Is the patient deaf or have difficulty hearing?: No Does the patient have difficulty seeing, even when wearing glasses/contacts?: No Does the patient have difficulty concentrating, remembering, or making decisions?: No Patient able to express need for assistance with ADLs?: Yes Does the  patient have difficulty dressing or bathing?: Yes Independently performs ADLs?: No Does the patient have difficulty walking or climbing stairs?: Yes Weakness of Legs: Both Weakness of Arms/Hands: Both  Permission Sought/Granted Permission sought to share information with : Family Supports    Share Information with NAME: daughter, [name]           Emotional Assessment         Alcohol / Substance Use: Not Applicable Psych Involvement: No (comment)  Admission diagnosis:  Diarrhea [R19.7] Hypotension, unspecified hypotension type [I95.9] Diarrhea, unspecified type [R19.7] Colitis [K52.9] Patient Active Problem List   Diagnosis Date Noted   Colitis 10/16/2022   Diarrhea 10/15/2022   Urge incontinence of urine 09/01/2022   Oral phase dysphagia 09/01/2022   Moderate protein-calorie malnutrition ([place]) 09/01/2022   Skin ulcer of sacrum, limited to breakdown of skin ([place]) 08/12/2022   Closed displaced fracture of sixth cervical vertebra with routine healing 08/06/2022   Chronic anemia 07/27/2022   UTI (urinary tract infection) 07/25/2022   Compression fracture of L2 ([place]) 07/25/2022   Abdominal cramping 04/07/2022   Left ureteral stone 03/28/2022   Nausea & vomiting 03/28/2022   Serrated adenoma of colon 01/09/2022   Depression, major, single episode, moderate ([place]) 12/05/2021   Gastroesophageal reflux disease 12/05/2021   Physical deconditioning 12/05/2021   Mixed hyperlipidemia 12/05/2021   Essential hypertension 07/31/2021   Age related osteoporosis 07/31/2021   Acquired hypothyroidism 07/31/2021   Chronic diarrhea 07/31/2021  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   Iron deficiency anemia due to chronic blood loss 03/04/2021   Thrombocytopenia ([place]) 03/04/2021   Acquired thrombophilia ([place]) 02/21/2021   CAD (coronary artery disease)    Lichen planus 48/62/8241   AF (paroxysmal atrial fibrillation) ([place]) 10/02/2020   Liver cirrhosis secondary to  NASH ([place]) 10/02/2020   Graves disease 75/30/1040   Nonalcoholic steatohepatitis ([place]) 10/12/2012   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 [place] [address] Phone: [phone] Fax: [phone]     Social Determinants of Health (SDOH) Interventions    Readmission Risk Interventions    07/25/2022    3:44 PM  Readmission Risk Prevention Plan  Transportation Screening Complete  HRI or [place] Complete  Social Work Consult for [place] Planning/Counseling Complete  Palliative Care Screening Complete  Medication Review Press photographer) Complete

## 2022-10-17 NOTE — Care Management Important Message (Signed)
Important Message  Patient Details  Name: Tasha Roberts MRN: [id number] Date of Birth: [birth year]   Medicare Important Message Given:  Yes Norwood Levo M.,CMA, spoke with daughter Duaine Dredge at [phone] to review letter, no additional copy needed)     Tommy Medal 10/17/2022, 3:13 PM

## 2022-10-17 NOTE — Progress Notes (Signed)
Physical Therapy Treatment Patient Details Name: Tasha Roberts MRN: [id number] DOB: 1941-03-13 Today's Date: 10/17/2022   History of Present Illness Tasha Roberts  is a 81 y.o. female, with medical history significant of chronic constipation, clotting disorder, depression, diabetes mellitus type 2, GERD, breast cancer, hyperlipidemia, hypertension, hypothyroidism, Nash liver cirrhosis, paroxysmal atrial fibrillation, [facility] to ED secondary to complaints of nausea and diarrhea.  Patient reports chronic diarrhea over the last 3 weeks, she had an ED visit 2 weeks ago diagnosed with colitis, discharged on Augmentin, she completed the course at home, reports abdominal pain much improved, diarrhea has improved as well but persisted, she does report some nausea as well, denies vomiting, reports poor appetite as well specially had some issues with her dentures and cannot tolerate regular food, or diarrhea has improved but has been intermittent between watery and loose stool at this point, she feels she is dehydrated, reports she has been checking her blood pressure at home which has been recently low, reports she has been compliant with her Eliquis.  -In ED she was initially hypotensive, but she responded to fluid bolus, [place] exam was benign, so imaging was deferred, but she was clinically dehydrated, so [facility] hospitalist consulted to admit.    PT Comments    Patient apprehensive for mobility today due to having flexi-seal but is agreeable to perform supine exercises. Patient with good mechanics of exercises following initial demonstration and cueing. Patient will benefit from continued skilled physical therapy in hospital and recommended venue below to increase strength, balance, endurance for safe ADLs and gait.    Recommendations for follow up therapy are one component of a multi-disciplinary discharge planning process, led by the attending physician.  Recommendations may be updated based on patient status,  additional functional criteria and insurance authorization.  Follow Up Recommendations  Home health PT     Assistance Recommended at Discharge Set up Supervision/Assistance  Patient can return home with the following A little help with walking and/or transfers;A little help with bathing/dressing/bathroom;Help with stairs or ramp for entrance;Assistance with cooking/housework   Equipment Recommendations  None recommended by PT    Recommendations for Other Services       Precautions / Restrictions Precautions Precautions: Fall Restrictions Weight Bearing Restrictions: No     Mobility  Bed Mobility                    Transfers                        Ambulation/Gait                   Stairs             Wheelchair Mobility    Modified Rankin (Stroke Patients Only)       Balance                                            Cognition Arousal/Alertness: Awake/alert Behavior During Therapy: WFL for tasks assessed/performed Overall Cognitive Status: Within Functional Limits for tasks assessed                                          Exercises General Exercises - Upper Extremity Shoulder Flexion: AROM, Both, 20 reps,  Supine Elbow Flexion: AROM, Both, 15 reps, Seated Elbow Extension: AROM, Both, 15 reps, Supine General Exercises - Lower Extremity Ankle Circles/Pumps: AROM, Both, 15 reps, Supine Quad Sets: AROM, Both, 15 reps, Supine Heel Slides: AROM, Both, 15 reps Straight Leg Raises: AROM, Both, 15 reps, Supine Hip Flexion/Marching: AROM, Both, 15 reps, Supine    General Comments        Pertinent Vitals/Pain      Home Living                          Prior Function            PT Goals (current goals can now be found in the care plan section) Acute Rehab PT Goals Patient Stated Goal: return home with family to assist PT Goal Formulation: With patient Time For Goal  Achievement: 10/23/22 Potential to Achieve Goals: Good Progress towards PT goals: Progressing toward goals    Frequency    Min 3X/week      PT Plan Current plan remains appropriate    Co-evaluation              AM-PAC PT "6 Clicks" Mobility   Outcome Measure  Help needed turning from your back to your side while in a flat bed without using bedrails?: None Help needed moving from lying on your back to sitting on the side of a flat bed without using bedrails?: A Little Help needed moving to and from a bed to a chair (including a wheelchair)?: A Little Help needed standing up from a chair using your arms (e.g., wheelchair or bedside chair)?: A Little Help needed to walk in hospital room?: A Little Help needed climbing 3-5 steps with a railing? : A Lot 6 Click Score: 18    End of Session   Activity Tolerance: Patient tolerated treatment well;Patient limited by fatigue Patient left: with call bell/phone within reach;in bed Nurse Communication: Mobility status PT Visit Diagnosis: Unsteadiness on feet (R26.81);Other abnormalities of gait and mobility (R26.89);Muscle weakness (generalized) (M62.81)     Time: 2671-2458 PT Time Calculation (min) (ACUTE ONLY): 14 min  Charges:  $Therapeutic Exercise: 8-22 mins                    2:40 PM, 10/17/22 Mearl Latin PT, DPT Physical Therapist at [facility]

## 2022-10-18 DIAGNOSIS — A09 Infectious gastroenteritis and colitis, unspecified: Secondary | ICD-10-CM | POA: Diagnosis not present

## 2022-10-18 MED ORDER — ALBUMIN HUMAN 25 % IV SOLN
25.0000 g | Freq: Once | INTRAVENOUS | Status: AC
  Administered 2022-10-18: 25 g via INTRAVENOUS
  Filled 2022-10-18: qty 100

## 2022-10-18 NOTE — Progress Notes (Signed)
PROGRESS NOTE    Patient: Tasha Roberts                            PCP: Lindell Spar, MD                    DOB: [birth year]            DOA: 10/15/2022 [id number]:[id number]             DOS: 10/18/2022, 12:20 PM   LOS: 2 days   Date of Service: The patient was seen and examined on 10/18/2022  Subjective:  The patient was seen and examined this morning, awake alert, pleasant, still having 5+ stool, liquid watery stool noted in rectal tube  Mildly hypotensive otherwise stable  Brief Narrative:   Tasha Roberts  is a 81 y.o. female, with medical history significant of chronic constipation, clotting disorder, depression, diabetes mellitus type 2, GERD, breast cancer, hyperlipidemia, hypertension, hypothyroidism, Nash liver cirrhosis, paroxysmal atrial fibrillation, [facility] to ED secondary to complaints of nausea and diarrhea.  Patient reports chronic diarrhea over the last 3 weeks, she had an ED visit 2 weeks ago diagnosed with colitis, discharged on Augmentin, she completed the course at home, reports abdominal pain much improved, diarrhea has improved as well but persisted, she does report some nausea as well, denies vomiting, reports poor appetite as well specially had some issues with her dentures and cannot tolerate regular food, or diarrhea has improved but has been intermittent between watery and loose stool at this point, she feels she is dehydrated, reports she has been checking her blood pressure at home which has been recently low, reports she has been compliant with her Eliquis.  -In ED she was initially hypotensive, but she responded to fluid bolus,exam was benign, so imaging was deferred, but she was clinically dehydrated, so [facility] hospitalist consulted to admit.      Assessment & Plan:     Principal Problem:   Diarrhea Active Problems:   AF (paroxysmal atrial fibrillation) (HCC)   Acquired hypothyroidism   Essential hypertension   Mixed hyperlipidemia   Chronic anemia   CAD (coronary  artery disease)   Chronic diarrhea  Hypotension/ Dehydration/due to diarrhea  -Still having liquid watery stool in rectal tube -Mildly hypotensive  -Status post gentle IV fluid hydration,--holding for now Encouraging p.o. intake  -Repleting albumin  -Patient presents with poor oral intake, diarrhea, cynically dehydrated  - Random cortisol level 22.1  - Procalcitonin level <0.10  -Abdomen exam is benign, will hold on repeat imaging, had CT abdomen pelvis 10/29 significant for colitis/enteritis   -Will start on low-dose midodrine till she is appropriately IV hydrated.  C. difficile colitis/diarrhea - ++  C. difficile -  GI panel -Still having liquid watery stool in the rectal tube  -Status post IV fluid hydration -We will continue to monitor diarrhea and further hydration as needed -We will start the patient on oral medication of DIFCID 200 mg twice daily x10 days (Pharmacy has obtained the medication, pending discharge)     Paroxysmal A-fib -with tachycardia -Resolved -Was tachycardic -Continue with Eliquis for anticoagulation  -Resuming home medication of diltiazem CD -Monitoring for hypotension and, A on CKD      CKD stage IIIb -Renal function at baseline, was on IV fluids. -Improved Lab Results  Component Value Date   CREATININE 1.25 (H) 10/16/2022   CREATININE 1.60 (H) 10/15/2022   CREATININE 1.68 (H) 10/05/2022  Hyperlipidemia  - continue with home dose statin   GERD -Continue with PPI   Hypothyroidism -Continue with Synthroid   Hypertension  - Her blood pressure is actually low, so we will hold home meds  Moderate malnutrition Body mass index is 16.65 kg/m. Dietitian consult, continue dietary supplements   Chronic anemia of iron deficiency Exacerbated by dietary malnutrition-  H&H remained stable, Continue treating underlying cause of malnutrition      --------------------------------------------------------------------------------------------------------------------------------------- Nutritional status:  The patient's BMI is: Body mass index is 16.65 kg/m. I agree with the assessment and plan as outlined below: Nutrition Status: Nutrition Problem: Moderate Malnutrition (inadequate oral intake) Etiology: altered GI function (diarrhea x 3 weeks) Signs/Symptoms: per patient/family report, energy intake < 75% for > 7 days, mild muscle depletion, moderate muscle depletion (diarrhea x 3 weeks) Interventions: MVI (Greek yogurt, Banatrol BID)     Skin Assessment: I have examined the patient's skin and I agree with the wound assessment as performed by wound care team As outlined -----------------------------------------------------------------------------------------------------------------------------------------  DVT prophylaxis:  apixaban (ELIQUIS) tablet 2.5 mg Start: 10/15/22 2200 apixaban (ELIQUIS) tablet 2.5 mg   Code Status:   Code Status: Full Code  Family Communication: No family member present at bedside- attempt will be made to update daily The above findings and plan of care has been discussed with patient (and family)  in detail,  they expressed understanding and agreement of above. -Advance care planning has been discussed.   Admission status:   Status is: Inpatient Remains inpatient appropriate because: Needing IV fluids, monitoring profuse diarrhea Treating severe dehydration, hypotension,     Procedures:   No admission procedures for hospital encounter.   Antimicrobials:  Anti-infectives (From admission, onward)    Start     Dose/Rate Route Frequency Ordered Stop   10/16/22 1000  fidaxomicin (DIFICID) tablet 200 mg        200 mg Oral 2 times daily 10/16/22 0736 10/26/22 0959   10/16/22 0000  fidaxomicin (DIFICID) 200 MG TABS tablet       Note to Pharmacy: Patient at [facility] - please deliver on 11/10.   Copay should be about $50 after coupon automatically applied.  Call [phone] for questions. JFrens   200 mg Oral 2 times daily 10/16/22 1135          Medication:   acidophilus  2 capsule Oral TID   apixaban  2.5 mg Oral BID   atorvastatin  20 mg Oral Daily   Chlorhexidine Gluconate Cloth  6 each Topical Daily   diltiazem  120 mg Oral Daily   fiber supplement (BANATROL TF)  60 mL Per Tube BID   fidaxomicin  200 mg Oral BID   Gerhardt's butt cream   Topical TID   influenza vaccine adjuvanted  0.5 mL Intramuscular Tomorrow-1000   levothyroxine  100 mcg Oral QAC breakfast   midodrine  5 mg Oral TID WC   multivitamin with minerals  1 tablet Oral Daily   sodium chloride flush  10-40 mL Intracatheter Q12H    acetaminophen **OR** acetaminophen, ondansetron, sodium chloride flush   Objective:   Vitals:   10/17/22 0526 10/17/22 1300 10/17/22 1945 10/18/22 0455  BP: 103/60  (!) 109/52 110/60  Pulse: (!) 108 60 60 63  Resp: 18 18 18 20   Temp: (!) 97.4 F (36.3 C) 98.2 F (36.8 C) 97.7 F (36.5 C) 98.1 F (36.7 C)  TempSrc: Oral Oral Oral Oral  SpO2: 93% 92% 95% 95%  Weight:  Height:        Intake/Output Summary (Last 24 hours) at 10/18/2022 1220 Last data filed at 10/18/2022 0900 Gross per 24 hour  Intake 0 ml  Output 25 ml  Net -25 ml   Filed Weights   10/15/22 1217  Weight: 42.6 kg     Examination:     Physical Exam:   General:  Chronically ill, cachectic elderly female  AAO x 3,  cooperative, no distress;   HEENT:  Normocephalic, PERRL, otherwise with in Normal limits   Neuro:  CNII-XII intact. , normal motor and sensation, reflexes intact   Lungs:   Clear to auscultation BL, Respirations unlabored,  No wheezes / crackles  Cardio:    S1/S2, RRR, No murmure, No Rubs or Gallops   Abdomen:  Soft, non-tender, bowel sounds active all four quadrants, no guarding or peritoneal signs.  Muscular  skeletal:  Limited exam -global generalized weaknesses -  in bed, able to move all 4 extremities,   2+ pulses,  symmetric, No pitting edema  Skin:  Dry, warm to touch, negative for any Rashes,  Wounds: Please see nursing documentation      Wounds: per nursing documentation Rectal tube in place Left PICC line upper arm  ------------------------------------------------------------------------------------------------------------------------------------------    LABs:     Latest Ref Rng & Units 10/16/2022    4:41 AM 10/15/2022   10:30 AM 10/05/2022    8:03 PM  CBC  WBC 4.0 - 10.5 K/uL 4.5  7.5  8.9   Hemoglobin 12.0 - 15.0 g/dL 9.3  11.3  10.9   Hematocrit 36.0 - 46.0 % 27.7  35.0  33.1   Platelets 150 - 400 K/uL 100  176  120       Latest Ref Rng & Units 10/16/2022    4:41 AM 10/15/2022   10:30 AM 10/05/2022    8:03 PM  CMP  Glucose 70 - 99 mg/dL 68  86  113   BUN 8 - 23 mg/dL 19  23  32   Creatinine 0.44 - 1.00 mg/dL 1.25  1.60  1.68   Sodium 135 - 145 mmol/L 138  136  139   Potassium 3.5 - 5.1 mmol/L 4.0  4.1  5.1   Chloride 98 - 111 mmol/L 112  108  109   CO2 22 - 32 mmol/L 21  19  24    Calcium 8.9 - 10.3 mg/dL 7.8  8.2  8.8   Total Protein 6.5 - 8.1 g/dL  5.2  5.1   Total Bilirubin 0.3 - 1.2 mg/dL  1.1  0.8   Alkaline Phos 38 - 126 U/L  94  130   AST 15 - 41 U/L  39  47   ALT 0 - 44 U/L  21  28        Micro Results Recent Results (from the past 240 hour(s))  C Difficile Quick Screen w PCR reflex     Status: Abnormal   Collection Time: 10/15/22  6:15 PM   Specimen: STOOL  Result Value Ref Range Status   C Diff antigen POSITIVE (A) NEGATIVE Final   C Diff toxin NEGATIVE NEGATIVE Final   C Diff interpretation Results are indeterminate. See PCR results.  Final    Comment: Performed at [facility], [address], [place], [place] [address]  Gastrointestinal Panel by PCR , Stool     Status: None   Collection Time: 10/15/22  6:15 PM   Specimen: STOOL  Result Value Ref Range Status  Campylobacter species NOT DETECTED  NOT DETECTED Final   Plesimonas shigelloides NOT DETECTED NOT DETECTED Final   Salmonella species NOT DETECTED NOT DETECTED Final   Yersinia enterocolitica NOT DETECTED NOT DETECTED Final   Vibrio species NOT DETECTED NOT DETECTED Final   Vibrio cholerae NOT DETECTED NOT DETECTED Final   Enteroaggregative E coli (EAEC) NOT DETECTED NOT DETECTED Final   Enteropathogenic E coli (EPEC) NOT DETECTED NOT DETECTED Final   Enterotoxigenic E coli (ETEC) NOT DETECTED NOT DETECTED Final   Shiga like toxin producing E coli (STEC) NOT DETECTED NOT DETECTED Final   Shigella/Enteroinvasive E coli (EIEC) NOT DETECTED NOT DETECTED Final   Cryptosporidium NOT DETECTED NOT DETECTED Final   Cyclospora cayetanensis NOT DETECTED NOT DETECTED Final   Entamoeba histolytica NOT DETECTED NOT DETECTED Final   Giardia lamblia NOT DETECTED NOT DETECTED Final   Adenovirus F40/41 NOT DETECTED NOT DETECTED Final   Astrovirus NOT DETECTED NOT DETECTED Final   Norovirus GI/GII NOT DETECTED NOT DETECTED Final   Rotavirus A NOT DETECTED NOT DETECTED Final   Sapovirus (I, II, IV, and V) NOT DETECTED NOT DETECTED Final    Comment: Performed at [facility], [address] [place], [place] [address]  C. Diff by PCR, Reflexed     Status: Abnormal   Collection Time: 10/15/22  6:15 PM  Result Value Ref Range Status   Toxigenic C. Difficile by PCR POSITIVE (A) NEGATIVE Final    Comment: Positive for toxigenic C. difficile with little to no toxin production. Only treat if clinical presentation suggests symptomatic illness. Performed at [facility], [place] [employer] [address] [place], [place] [address]     Radiology Reports No results found.  SIGNED: Deatra James, MD, FHM. [facility],  Pager (please use [email or web address] to page/text) Please use Epic Secure Chat for non-urgent communication (7AM-7PM)  If 7PM-7AM, please contact night-coverage [email or web address], 10/18/2022, 12:20 PM

## 2022-10-19 DIAGNOSIS — A09 Infectious gastroenteritis and colitis, unspecified: Secondary | ICD-10-CM | POA: Diagnosis not present

## 2022-10-19 LAB — COMPREHENSIVE METABOLIC PANEL
ALT: 12 U/L (ref 0–44)
AST: 25 U/L (ref 15–41)
Albumin: 2.1 g/dL — ABNORMAL LOW (ref 3.5–5.0)
Alkaline Phosphatase: 63 U/L (ref 38–126)
Anion gap: 3 — ABNORMAL LOW (ref 5–15)
BUN: 18 mg/dL (ref 8–23)
CO2: 22 mmol/L (ref 22–32)
Calcium: 7.4 mg/dL — ABNORMAL LOW (ref 8.9–10.3)
Chloride: 111 mmol/L (ref 98–111)
Creatinine, Ser: 1.81 mg/dL — ABNORMAL HIGH (ref 0.44–1.00)
GFR, Estimated: 28 mL/min — ABNORMAL LOW (ref >60)
Glucose, Bld: 80 mg/dL (ref 70–99)
Potassium: 4 mmol/L (ref 3.5–5.1)
Sodium: 136 mmol/L (ref 135–145)
Total Bilirubin: 0.7 mg/dL (ref 0.3–1.2)
Total Protein: 3.7 g/dL — ABNORMAL LOW (ref 6.5–8.1)

## 2022-10-19 MED ORDER — APIXABAN 2.5 MG PO TABS
2.5000 mg | ORAL_TABLET | Freq: Two times a day (BID) | ORAL | Status: DC
  Administered 2022-10-20: 2.5 mg via ORAL
  Filled 2022-10-19: qty 1

## 2022-10-19 MED ORDER — SODIUM CHLORIDE 0.9 % IV SOLN: INTRAVENOUS | Status: DC

## 2022-10-19 MED ORDER — ALBUMIN HUMAN 25 % IV SOLN
25.0000 g | Freq: Once | INTRAVENOUS | Status: AC
  Administered 2022-10-19: 25 g via INTRAVENOUS
  Filled 2022-10-19: qty 100

## 2022-10-19 MED ORDER — SODIUM CHLORIDE 0.9 % IV SOLN: INTRAVENOUS | Status: AC

## 2022-10-19 NOTE — Progress Notes (Signed)
PROGRESS NOTE    Patient: Tasha Roberts                            PCP: Lindell Spar, MD                    DOB: Apr 08, 1941            DOA: 10/15/2022 ZCH:[id number]             DOS: 10/19/2022, 11:25 AM   LOS: 3 days   Date of Service: The patient was seen and examined on 10/19/2022  Subjective:  The patient was seen and examined this morning, rectal tube still in place, liquid brownish stool noted in the tube. Mildly hypotensive, blood pressure 96/41, heart rate 61 satting 95% room air Elevated creatinine this morning 1.81 from 1.25 Bladder bleeding at the PICC line site  Otherwise stable.    Brief Narrative:   Tasha Roberts  is a 81 y.o. female, with medical history significant of chronic constipation, clotting disorder, depression, diabetes mellitus type 2, GERD, breast cancer, hyperlipidemia, hypertension, hypothyroidism, Nash liver cirrhosis, paroxysmal atrial fibrillation, [facility] to ED secondary to complaints of nausea and diarrhea.  Patient reports chronic diarrhea over the last 3 weeks, she had an ED visit 2 weeks ago diagnosed with colitis, discharged on Augmentin, she completed the course at home, reports abdominal pain much improved, diarrhea has improved as well but persisted, she does report some nausea as well, denies vomiting, reports poor appetite as well specially had some issues with her dentures and cannot tolerate regular food, or diarrhea has improved but has been intermittent between watery and loose stool at this point, she feels she is dehydrated, reports she has been checking her blood pressure at home which has been recently low, reports she has been compliant with her Eliquis.  -In ED she was initially hypotensive, but she responded to fluid bolus,exam was benign, so imaging was deferred, but she was clinically dehydrated, so [facility] hospitalist consulted to admit.      Assessment & Plan:     Principal Problem:   Diarrhea Active Problems:   AF (paroxysmal  atrial fibrillation) (HCC)   Acquired hypothyroidism   Essential hypertension   Mixed hyperlipidemia   Chronic anemia   CAD (coronary artery disease)   Chronic diarrhea  Hypotension/ Dehydration/due to diarrhea  -Still having liquid watery stool in rectal tube -Still hypotensive, elevated creatinine Restarting IV fluids  Encouraging p.o. intake  -Hypoalbuminemia, repleting accordingly IV  -Patient presents with poor oral intake, diarrhea, cynically dehydrated  - Random cortisol level 22.1  - Procalcitonin level <0.10  -Abdomen exam is benign, will hold on repeat imaging, had CT abdomen pelvis 10/29 significant for colitis/enteritis   -Will start on low-dose midodrine till she is appropriately IV hydrated.  C. difficile colitis/diarrhea - ++  C. difficile -  GI panel -Not much improvement -Still having liquid watery stool in the rectal tube  -Restarting IV fluid -We will continue to monitor diarrhea and further hydration as needed -We will start the patient on oral medication of DIFCID 200 mg twice daily x10 days (Pharmacy has obtained the medication, pending discharge)     Paroxysmal A-fib -with tachycardia -Resolved -Was tachycardic -Continue with Eliquis for anticoagulation  -Resuming home medication of diltiazem CD -Monitoring for hypotension and, A on CKD      CKD stage IIIb -Renal function at baseline, was on IV fluids. -Improved Lab  Results  Component Value Date   CREATININE 1.81 (H) 10/19/2022   CREATININE 1.25 (H) 10/16/2022   CREATININE 1.60 (H) 10/15/2022   Reinitiating gentle IV fluid hydration   Hyperlipidemia  - continue with home dose statin   GERD --Discontinue PPI in the setting of C. difficile colitis   Hypothyroidism -Continue with Synthroid   Hypertension  --Hypotensive with holding home medications  Moderate malnutrition Body mass index is 16.65 kg/m. Dietitian consult, continue dietary supplements   Chronic anemia of iron  deficiency Exacerbated by dietary malnutrition-  H&H remained stable, Continue treating underlying cause of malnutrition     --------------------------------------------------------------------------------------------------------------------------------------- Nutritional status:  The patient's BMI is: Body mass index is 16.65 kg/m. I agree with the assessment and plan as outlined below: Nutrition Status: Nutrition Problem: Moderate Malnutrition (inadequate oral intake) Etiology: altered GI function (diarrhea x 3 weeks) Signs/Symptoms: per patient/family report, energy intake < 75% for > 7 days, mild muscle depletion, moderate muscle depletion (diarrhea x 3 weeks) Interventions: MVI (Greek yogurt, Banatrol BID)     Skin Assessment: I have examined the patient's skin and I agree with the wound assessment as performed by wound care team As outlined -----------------------------------------------------------------------------------------------------------------------------------------  DVT prophylaxis:  apixaban (ELIQUIS) tablet 2.5 mg Start: 10/20/22 1000 apixaban (ELIQUIS) tablet 2.5 mg   Code Status:   Code Status: Full Code  Family Communication: No family member present at bedside- attempt will be made to update daily The above findings and plan of care has been discussed with patient (and family)  in detail,  they expressed understanding and agreement of above. -Advance care planning has been discussed.   Admission status:   Status is: Inpatient Remains inpatient appropriate because: Needing IV fluids, monitoring profuse diarrhea Treating severe dehydration, hypotension,     Procedures:   No admission procedures for hospital encounter.   Antimicrobials:  Anti-infectives (From admission, onward)    Start     Dose/Rate Route Frequency Ordered Stop   10/16/22 1000  fidaxomicin (DIFICID) tablet 200 mg        200 mg Oral 2 times daily 10/16/22 0736 10/26/22 0959    10/16/22 0000  fidaxomicin (DIFICID) 200 MG TABS tablet       Note to Pharmacy: Patient at [facility] - please deliver on 11/10.  Copay should be about $50 after coupon automatically applied.  Call [phone] for questions. JFrens   200 mg Oral 2 times daily 10/16/22 1135          Medication:   acidophilus  2 capsule Oral TID   [START ON 10/20/2022] apixaban  2.5 mg Oral BID   atorvastatin  20 mg Oral Daily   Chlorhexidine Gluconate Cloth  6 each Topical Daily   diltiazem  120 mg Oral Daily   fiber supplement (BANATROL TF)  60 mL Per Tube BID   fidaxomicin  200 mg Oral BID   Gerhardt's butt cream   Topical TID   influenza vaccine adjuvanted  0.5 mL Intramuscular Tomorrow-1000   levothyroxine  100 mcg Oral QAC breakfast   midodrine  5 mg Oral TID WC   multivitamin with minerals  1 tablet Oral Daily    acetaminophen **OR** acetaminophen, ondansetron   Objective:   Vitals:   10/18/22 2123 10/18/22 2348 10/19/22 0506 10/19/22 0802  BP: (!) 156/134 (!) 115/42 (!) 101/39 (!) 96/41  Pulse: 61 66 61   Resp: 20 17 17    Temp: 97.7 F (36.5 C) 97.7 F (36.5 C) 97.6 F (36.4 C)  TempSrc: Oral Oral Oral   SpO2: 100% 100% 95%   Weight:      Height:        Intake/Output Summary (Last 24 hours) at 10/19/2022 1125 Last data filed at 10/19/2022 0900 Gross per 24 hour  Intake 1271 ml  Output --  Net 1271 ml   Filed Weights   10/15/22 1217  Weight: 42.6 kg     Examination:   General:  Chronically ill, cachectic elderly female AAO x 3,  cooperative, no distress;   HEENT:  Normocephalic, PERRL, otherwise with in Normal limits   Neuro:  CNII-XII intact. , normal motor and sensation, reflexes intact   Lungs:   Clear to auscultation BL, Respirations unlabored,  No wheezes / crackles  Cardio:    S1/S2, RRR, No murmure, No Rubs or Gallops   Abdomen:  Soft, non-tender, bowel sounds active all four quadrants, no guarding or peritoneal signs.  Muscular  skeletal:  Limited exam  -global generalized weaknesses - in bed, able to move all 4 extremities,   2+ pulses,  symmetric, No pitting edema  Skin:  Dry, warm to touch, negative for any Rashes,   Wounds: per nursing documentation Rectal tube in place --liquid stool noted Left PICC line upper arm -mild bleeding from site  ------------------------------------------------------------------------------------------------------------------------------------------    LABs:     Latest Ref Rng & Units 10/16/2022    4:41 AM 10/15/2022   10:30 AM 10/05/2022    8:03 PM  CBC  WBC 4.0 - 10.5 K/uL 4.5  7.5  8.9   Hemoglobin 12.0 - 15.0 g/dL 9.3  11.3  10.9   Hematocrit 36.0 - 46.0 % 27.7  35.0  33.1   Platelets 150 - 400 K/uL 100  176  120       Latest Ref Rng & Units 10/19/2022    7:45 AM 10/16/2022    4:41 AM 10/15/2022   10:30 AM  CMP  Glucose 70 - 99 mg/dL 80  68  86   BUN 8 - 23 mg/dL 18  19  23    Creatinine 0.44 - 1.00 mg/dL 1.81  1.25  1.60   Sodium 135 - 145 mmol/L 136  138  136   Potassium 3.5 - 5.1 mmol/L 4.0  4.0  4.1   Chloride 98 - 111 mmol/L 111  112  108   CO2 22 - 32 mmol/L 22  21  19    Calcium 8.9 - 10.3 mg/dL 7.4  7.8  8.2   Total Protein 6.5 - 8.1 g/dL 3.7   5.2   Total Bilirubin 0.3 - 1.2 mg/dL 0.7   1.1   Alkaline Phos 38 - 126 U/L 63   94   AST 15 - 41 U/L 25   39   ALT 0 - 44 U/L 12   21        Micro Results Recent Results (from the past 240 hour(s))  C Difficile Quick Screen w PCR reflex     Status: Abnormal   Collection Time: 10/15/22  6:15 PM   Specimen: STOOL  Result Value Ref Range Status   C Diff antigen POSITIVE (A) NEGATIVE Final   C Diff toxin NEGATIVE NEGATIVE Final   C Diff interpretation Results are indeterminate. See PCR results.  Final    Comment: Performed at [facility], [address] [place], [place] [address]  Gastrointestinal Panel by PCR , Stool     Status: None   Collection Time: 10/15/22  6:15 PM  Specimen: STOOL  Result Value Ref Range Status    Campylobacter species NOT DETECTED NOT DETECTED Final   Plesimonas shigelloides NOT DETECTED NOT DETECTED Final   Salmonella species NOT DETECTED NOT DETECTED Final   Yersinia enterocolitica NOT DETECTED NOT DETECTED Final   Vibrio species NOT DETECTED NOT DETECTED Final   Vibrio cholerae NOT DETECTED NOT DETECTED Final   Enteroaggregative E coli (EAEC) NOT DETECTED NOT DETECTED Final   Enteropathogenic E coli (EPEC) NOT DETECTED NOT DETECTED Final   Enterotoxigenic E coli (ETEC) NOT DETECTED NOT DETECTED Final   Shiga like toxin producing E coli (STEC) NOT DETECTED NOT DETECTED Final   Shigella/Enteroinvasive E coli (EIEC) NOT DETECTED NOT DETECTED Final   Cryptosporidium NOT DETECTED NOT DETECTED Final   Cyclospora cayetanensis NOT DETECTED NOT DETECTED Final   Entamoeba histolytica NOT DETECTED NOT DETECTED Final   Giardia lamblia NOT DETECTED NOT DETECTED Final   Adenovirus F40/41 NOT DETECTED NOT DETECTED Final   Astrovirus NOT DETECTED NOT DETECTED Final   Norovirus GI/GII NOT DETECTED NOT DETECTED Final   Rotavirus A NOT DETECTED NOT DETECTED Final   Sapovirus (I, II, IV, and V) NOT DETECTED NOT DETECTED Final    Comment: Performed at [facility], [address] [place], [place] [address]  C. Diff by PCR, Reflexed     Status: Abnormal   Collection Time: 10/15/22  6:15 PM  Result Value Ref Range Status   Toxigenic C. Difficile by PCR POSITIVE (A) NEGATIVE Final    Comment: Positive for toxigenic C. difficile with little to no toxin production. Only treat if clinical presentation suggests symptomatic illness. Performed at [facility], [place] [address], [place], [place] [address]     Radiology Reports No results found.  SIGNED: Deatra James, MD, FHM. [facility],  Pager (please use [email or web address] to page/text) Please use Epic Secure Chat for non-urgent communication (7AM-7PM)  If 7PM-7AM, please contact night-coverage [email or web address], 10/19/2022, 11:25  AM

## 2022-10-19 NOTE — Plan of Care (Signed)

## 2022-10-19 NOTE — Progress Notes (Signed)
Patient has 460m in her bladder. In and Out cath was ordered by MD Shahmehdi. In and out was completed.

## 2022-10-19 NOTE — Progress Notes (Signed)
Patient's rectal tube had formed stool in it that had to be pushed down the tube towards the bag. MD Shahmehdi notified. MD Shahmehdi ordered for removal of the rectal tube.

## 2022-10-19 NOTE — Progress Notes (Signed)
Patient's picc line was bloody yesterday according to the previous nurse. The picc line dressing was changed yesterday. On assessment the patient's picc line is bloody again today. Patient is on eliquis. MD Shahmehdi notified.

## 2022-10-20 ENCOUNTER — Ambulatory Visit: Payer: Medicare Other

## 2022-10-20 ENCOUNTER — Other Ambulatory Visit (HOSPITAL_COMMUNITY): Payer: Self-pay

## 2022-10-20 DIAGNOSIS — I4811 Longstanding persistent atrial fibrillation: Secondary | ICD-10-CM

## 2022-10-20 DIAGNOSIS — A09 Infectious gastroenteritis and colitis, unspecified: Secondary | ICD-10-CM | POA: Diagnosis not present

## 2022-10-20 DIAGNOSIS — I9589 Other hypotension: Secondary | ICD-10-CM

## 2022-10-20 DIAGNOSIS — E860 Dehydration: Secondary | ICD-10-CM | POA: Diagnosis not present

## 2022-10-20 DIAGNOSIS — E861 Hypovolemia: Secondary | ICD-10-CM

## 2022-10-20 LAB — COMPREHENSIVE METABOLIC PANEL
ALT: 12 U/L (ref 0–44)
AST: 24 U/L (ref 15–41)
Albumin: 2.5 g/dL — ABNORMAL LOW (ref 3.5–5.0)
Alkaline Phosphatase: 57 U/L (ref 38–126)
Anion gap: 3 — ABNORMAL LOW (ref 5–15)
BUN: 18 mg/dL (ref 8–23)
CO2: 20 mmol/L — ABNORMAL LOW (ref 22–32)
Calcium: 7.5 mg/dL — ABNORMAL LOW (ref 8.9–10.3)
Chloride: 113 mmol/L — ABNORMAL HIGH (ref 98–111)
Creatinine, Ser: 1.89 mg/dL — ABNORMAL HIGH (ref 0.44–1.00)
GFR, Estimated: 27 mL/min — ABNORMAL LOW (ref >60)
Glucose, Bld: 79 mg/dL (ref 70–99)
Potassium: 3.9 mmol/L (ref 3.5–5.1)
Sodium: 136 mmol/L (ref 135–145)
Total Bilirubin: 0.8 mg/dL (ref 0.3–1.2)
Total Protein: 3.9 g/dL — ABNORMAL LOW (ref 6.5–8.1)

## 2022-10-20 LAB — BRAIN NATRIURETIC PEPTIDE: B Natriuretic Peptide: 933 pg/mL — ABNORMAL HIGH (ref 0.0–100.0)

## 2022-10-20 MED ORDER — GERHARDT'S BUTT CREAM: 1.0000 | TOPICAL_CREAM | Freq: Three times a day (TID) | CUTANEOUS | 0 refills | Status: AC

## 2022-10-20 MED ORDER — ONDANSETRON 4 MG PO TBDP: 4.0000 mg | ORAL_TABLET | Freq: Three times a day (TID) | ORAL | 0 refills | Status: DC | PRN

## 2022-10-20 MED ORDER — FUROSEMIDE 10 MG/ML IJ SOLN
40.0000 mg | Freq: Once | INTRAMUSCULAR | Status: AC
  Administered 2022-10-20: 40 mg via INTRAVENOUS
  Filled 2022-10-20: qty 4

## 2022-10-20 MED ORDER — APIXABAN 2.5 MG PO TABS: 2.5000 mg | ORAL_TABLET | Freq: Two times a day (BID) | ORAL | 0 refills | Status: DC

## 2022-10-20 MED ORDER — MIDODRINE HCL 5 MG PO TABS: 5.0000 mg | ORAL_TABLET | Freq: Three times a day (TID) | ORAL | 0 refills | Status: DC

## 2022-10-20 MED ORDER — RISAQUAD PO CAPS: 2.0000 | ORAL_CAPSULE | Freq: Three times a day (TID) | ORAL | 0 refills | Status: AC

## 2022-10-20 MED ORDER — ADULT MULTIVITAMIN W/MINERALS CH: 1.0000 | ORAL_TABLET | Freq: Every day | ORAL | 0 refills | Status: DC

## 2022-10-20 MED ORDER — BANATROL TF EN LIQD: 60.0000 mL | Freq: Two times a day (BID) | ENTERAL | 0 refills | Status: DC

## 2022-10-20 NOTE — TOC Transition Note (Signed)
Transition of Care [facility]) - CM/SW Discharge Note   Patient Details  Name: Tasha Roberts MRN: [id number] Date of Birth: September 05, 1941  Transition of Care [facility]) CM/SW Contact:  Salome Arnt, [place] Phone Number: 10/20/2022, 12:03 PM   Clinical Narrative: Pt d/c today and will resume [place] home health PT, RN. MD notified for orders. Linda with [facility] aware of d/c.       Final next level of care: [place] Barriers to Discharge: Barriers Resolved   Patient Goals and CMS Choice Patient states their goals for this hospitalization and ongoing recovery are:: return home      Discharge Placement                  Name of family member notified: Patient and family notified of of transfer: 10/20/22  Discharge Plan and Services                          HH Arranged: RN, PT [facility] Agency: [place] ([place]) Date [place]: 10/20/22 Time [place]: 3335 Representative spoke with at [place]: [place] ([place]) Interventions Housing Interventions: Intervention Not Indicated   Readmission Risk Interventions    07/25/2022    3:44 PM  Readmission Risk Prevention Plan  Transportation Screening Complete  HRI or [place] Complete  Social Work Consult for [place] Planning/Counseling Complete  Palliative Care Screening Complete  Medication Review Press photographer) Complete

## 2022-10-20 NOTE — Progress Notes (Signed)
PICC line removal tolerated well, catheter intact upon removal measuring 26cm, no drainage noted from site, site dressed with gauze dressing and transparent covering, patient educated regarding bleeding risk, patient verbalized understanding.

## 2022-10-20 NOTE — Discharge Summary (Signed)
Physician Discharge Summary   Patient: Tasha Roberts MRN: [id number] DOB: 1941-12-03  Admit date:     10/15/2022  Discharge date: 10/20/22  Discharge Physician: Deatra James   PCP: Lindell Spar, MD   Recommendations at discharge:    Follow-up with PCP 1 week. Avoid laxatives, stool softeners. Advance her diet as tolerated to regular. Continue oral hydration  Discharge Diagnoses: Principal Problem:   Diarrhea Active Problems:   AF (paroxysmal atrial fibrillation) (HCC)   Acquired hypothyroidism   Essential hypertension   Mixed hyperlipidemia   Chronic anemia   CAD (coronary artery disease)   Iron deficiency anemia due to chronic blood loss   Chronic diarrhea   Physical deconditioning   Moderate protein-calorie malnutrition (HCC)   Colitis    Hospital Course: [name]  is a 81 y.o. female, with medical history significant of chronic constipation, clotting disorder, depression, diabetes mellitus type 2, GERD, breast cancer, hyperlipidemia, hypertension, hypothyroidism, [place] liver cirrhosis, paroxysmal atrial fibrillation, [place] to ED secondary to complaints of nausea and diarrhea.  Patient reports chronic diarrhea over the last 3 weeks, she had an ED visit 2 weeks ago diagnosed with colitis, discharged on Augmentin, she completed the course at home, reports abdominal pain much improved, diarrhea has improved as well but persisted, she does report some nausea as well, denies vomiting, reports poor appetite as well specially had some issues with her dentures and cannot tolerate regular food, or diarrhea has improved but has been intermittent between watery and loose stool at this point, she feels she is dehydrated, reports she has been checking her blood pressure at home which has been recently low, reports she has been compliant with her Eliquis.  -In ED she was initially hypotensive, but she responded to fluid bolus,exam was benign, so imaging was deferred, but she was  clinically dehydrated, so [facility] hospitalist consulted to admit.     Hospital course     Principal Problem:   Diarrhea Active Problems:   AF (paroxysmal atrial fibrillation) (HCC)   Acquired hypothyroidism   Essential hypertension   Mixed hyperlipidemia   Chronic anemia   CAD (coronary artery disease)   Chronic diarrhea  Hypotension/ Dehydration/due to diarrhea  -Diarrhea liquid stool has improved, pasty stool noted -Mildly of with elevated creatinine..  - Continue midodrine  -Status post IV fluid resuscitation  Encouraging to increase oral intake -Abdomen exam is benign, will hold on repeat imaging, had CT abdomen pelvis 10/29 significant for colitis/enteritis   -Hypoalbuminemia, repleted with IV albumin x2 on this admission -Patient presents with poor oral intake, diarrhea, cynically dehydrated  - Random cortisol level 22.1  - Procalcitonin level <0.10    -Will start on low-dose midodrine till she is appropriately IV hydrated.  C. difficile colitis/diarrhea - ++  C. difficile -  GI panel -Not much improvement -Still having liquid watery stool in the rectal tube  -Status post IV fluid resuscitation -We will continue to monitor diarrhea and further hydration as needed -Continue oral medication of DIFCID 200 mg twice daily for at least 10-14  (Pharmacy has obtained the medication)     Paroxysmal A-fib -with tachycardia -Resolved -Was tachycardic -Continue with Eliquis for anticoagulation  -Resuming home medication of diltiazem CD   CKD stage IIIb -Continue to monitor as outpatient with PCP -Avoid nephrotoxins such as NSAIDs Lab Results  Component Value Date   CREATININE 1.89 (H) 10/20/2022   CREATININE 1.81 (H) 10/19/2022   CREATININE 1.25 (H) 10/16/2022  Hyperlipidemia  - continue with home dose statin   GERD --Discontinue PPI in the setting of C. difficile colitis   Hypothyroidism -Continue with Synthroid   H/o Hypertension -hypotensive  now --Hypotensive with holding home medications -On midodrine  Moderate malnutrition Body mass index is 16.65 kg/m. Dietitian consult, continue dietary supplements   Chronic anemia of iron deficiency Exacerbated by dietary malnutrition-  H&H remained stable, Continue treating underlying cause of malnutrition         Consultants: None Procedures performed: None none Disposition: Home Diet recommendation:  Discharge Diet Orders (From admission, onward)     Start     Ordered   10/20/22 0000  Diet - low sodium heart healthy        10/20/22 1157           Regular diet DISCHARGE MEDICATION: Allergies as of 10/20/2022       Reactions   Dicyclomine Other (See Comments)   Dry cough   Glucophage [metformin] Diarrhea   Losartan Other (See Comments)   Heartburn        Medication List     STOP taking these medications    amoxicillin-clavulanate 875-125 MG tablet Commonly known as: AUGMENTIN   cyclobenzaprine 10 MG tablet Commonly known as: FLEXERIL   docusate sodium 100 MG capsule Commonly known as: COLACE   ondansetron 4 MG tablet Commonly known as: Zofran   oxyCODONE 5 MG immediate release tablet Commonly known as: Oxy IR/ROXICODONE   polyethylene glycol 17 g packet Commonly known as: MIRALAX / GLYCOLAX       TAKE these medications    acidophilus Caps capsule Take 2 capsules by mouth 3 (three) times daily for 10 days.   apixaban 2.5 MG Tabs tablet Commonly known as: ELIQUIS Take 1 tablet (2.5 mg total) by mouth 2 (two) times daily. What changed:  medication strength how much to take   atorvastatin 20 MG tablet Commonly known as: LIPITOR TAKE (1) TABLET BY MOUTH ONCE DAILY. What changed: See the new instructions.   Cholecalciferol 50 MCG (2000 UT) Caps Take 1 capsule (2,000 Units total) by mouth daily.   Dificid 200 MG Tabs tablet Generic drug: fidaxomicin Take 1 tablet (200 mg total) by mouth 2 (two) times daily for 10 days    diltiazem 120 MG 24 hr capsule Commonly known as: CARDIZEM CD TAKE (1) CAPSULE BY MOUTH ONCE DAILY.   feeding supplement Liqd Take 237 mLs by mouth 2 (two) times daily between meals.   fiber supplement (BANATROL TF) liquid Place 60 mLs into feeding tube 2 (two) times daily for 10 days.   Gerhardt's butt cream Crea Apply 1 Application topically 3 (three) times daily.   levothyroxine 100 MCG tablet Commonly known as: SYNTHROID Take 1 tablet (100 mcg total) by mouth daily before breakfast.   lisinopril-hydrochlorothiazide 20-25 MG tablet Commonly known as: ZESTORETIC Take 1 tablet by mouth daily.   midodrine 5 MG tablet Commonly known as: PROAMATINE Take 1 tablet (5 mg total) by mouth 3 (three) times daily with meals for 10 days.   multivitamin with minerals Tabs tablet Take 1 tablet by mouth daily. Start taking on: October 21, 2022   nitroGLYCERIN 0.4 MG SL tablet Commonly known as: NITROSTAT Place 1 tablet (0.4 mg total) under the tongue every 5 (five) minutes as needed for chest pain.   ondansetron 4 MG disintegrating tablet Commonly known as: ZOFRAN-ODT Take 1 tablet (4 mg total) by mouth every 8 (eight) hours as needed for nausea or vomiting.  What changed:  how much to take how to take this when to take this reasons to take this additional instructions   tolterodine 4 MG 24 hr capsule Commonly known as: Detrol LA Take 1 capsule (4 mg total) by mouth daily.   vitamin B-12 500 MCG tablet Commonly known as: CYANOCOBALAMIN Take 500 mcg by mouth daily.        Discharge Exam: Filed Weights   10/15/22 1217  Weight: 42.6 kg      Physical Exam:   General:  AAO x 3,  cooperative, no distress;   HEENT:  Normocephalic, PERRL, otherwise with in Normal limits   Neuro:  CNII-XII intact. , normal motor and sensation, reflexes intact   Lungs:   Clear to auscultation BL, Respirations unlabored,  No wheezes / crackles  Cardio:    S1/S2, RRR, No murmure, No Rubs  or Gallops   Abdomen:  Soft, non-tender, bowel sounds active all four quadrants, no guarding or peritoneal signs.  Muscular  skeletal:  Limited exam -global generalized weaknesses - in bed, able to move all 4 extremities,   2+ pulses,  symmetric, No pitting edema  Skin:  Dry, warm to touch, negative for any Rashes,  Wounds: Please see nursing documentation          Condition at discharge: good  The results of significant diagnostics from this hospitalization (including imaging, microbiology, ancillary and laboratory) are listed below for reference.   Imaging Studies: DG CHEST PORT 1 VIEW  Result Date: 10/16/2022 CLINICAL DATA:  PICC placement. EXAM: PORTABLE CHEST 1 VIEW COMPARISON:  09/29/2022, additional priors reviewed. FINDINGS: Tip of the right upper extremity PICC overlies the SVC. There is no pneumothorax. Stable heart size and mediastinal contours. Small left pleural effusion with ill-defined opacity at the left lung base. Minimal ill-defined opacity in the right upper lung zone may represent overlap of the first rib costal cartilage and have been present on prior exams. No pulmonary edema. The bones are under mineralized IMPRESSION: 1. Tip of the right upper extremity PICC overlies the SVC. No pneumothorax. 2. Small left pleural effusion with ill-defined left lung base opacity, may represent atelectasis or pneumonia. Electronically Signed   By: Keith Rake M.D.   On: 10/16/2022 19:14   [place] EKG SITE RITE  Result Date: 10/16/2022 If Site Rite image not attached, placement could not be confirmed due to current cardiac rhythm.  CT ABDOMEN PELVIS WO CONTRAST  Addendum Date: 10/05/2022   ADDENDUM REPORT: 10/05/2022 22:12 COMPARISON:  CT abdomen and pelvis 03/27/2022. Lumbar spine CT 07/23/2022. FINDINGS: Lower chest: Moderate-sized left pleural effusion with compressive atelectasis of the left lower lobe. Hepatobiliary: Diffuse fatty infiltration. There is nodular liver contour  suggesting cirrhosis similar to the prior study. The liver is diffusely heterogeneous. A calcified granulomas present. The gallbladder surgically absent. Pancreas: Unremarkable. No pancreatic ductal dilatation or surrounding inflammatory changes. Spleen: Mildly enlarged, unchanged.  Calcified granulomas are seen. Adrenals/Urinary Tract: There is a 2 mm calculus in the distal left ureter at the level of the ureterovesicular junction. There is no hydronephrosis. There are additional punctate nonobstructing left renal calculi. The right kidney, adrenal glands and bladder are within normal limits. Stomach/Bowel: There is diffuse colonic wall thickening. There is no pneumatosis or free air. The appendix is not seen. No dilated bowel loops are identified. There is some questionable wall thickening of jejunal loops in the left upper quadrant. There is associated mesenteric edema. The stomach appears within normal limits. Vascular/Lymphatic: There are severe atherosclerotic  calcifications of the aorta. Aorta and IVC are nondilated. No enlarged lymph nodes are seen. Reproductive: Status post hysterectomy. No adnexal masses. Other: Small volume ascites in all 4 abdominal quadrants. Diffuse body wall edema. Musculoskeletal: The bones are diffusely osteopenic. Compression fracture of L2 is again seen. Degree of compression has progressed compared to the prior study (now 50 percent). There is mild retropulsion of fracture fragments. IMPRESSION: 1. Diffuse colonic wall thickening compatible with colitis. 2. Wall thickening and mesenteric edema involving jejunal loops in the left upper quadrant concerning for nonspecific enteritis including infectious, inflammatory and ischemic etiologies. 3. Small volume ascites. 4. Cirrhotic liver. 5. Moderate-sized left pleural effusion. 6. Diffuse body wall edema. 7. 2 mm calculus in the distal left ureter without hydronephrosis. 8. Progression of L2 compression fracture. Aortic  Atherosclerosis (ICD10-I70.0). These results were called by telephone at the time of interpretation on 10/05/2022 at 10:11 pm to provider [facility] ZAMMIT , who verbally acknowledged these results. Electronically Signed   By: Ronney Asters M.D.   On: 10/05/2022 22:12   Result Date: 10/05/2022 CLINICAL DATA:  Acute abdominal pain. EXAM: CT ABDOMEN AND PELVIS WITHOUT CONTRAST TECHNIQUE: Multidetector CT imaging of the abdomen and pelvis was performed following the standard protocol without IV contrast. RADIATION DOSE REDUCTION: This exam was performed according to the departmental dose-optimization program which includes automated exposure control, adjustment of the mA and/or kV according to patient size and/or use of iterative reconstruction technique. COMPARISON:  CT abdomen and pelvis 08/24/2022 FINDINGS: Lower chest: No acute abnormality. Hepatobiliary: Hypodensity in the dome of the liver is too small to characterize and unchanged, likely a cyst. Gallbladder is surgically absent. No bile duct dilatation. Pancreas: Unremarkable. No pancreatic ductal dilatation or surrounding inflammatory changes. Spleen: Normal in size without focal abnormality. Adrenals/Urinary Tract: Bilateral renal cysts are unchanged measuring up to 1 cm. No hydronephrosis or perinephric stranding. Adrenal glands and bladder are within normal limits. Stomach/Bowel: There is moderate fluid distention of the stomach. There is no bowel obstruction, pneumatosis, bowel wall thickening or free air. The appendix is not seen. Small bowel and colonic loops are predominantly fluid-filled. Vascular/Lymphatic: Aortic atherosclerosis. No enlarged abdominal or pelvic lymph nodes. Reproductive: Status post hysterectomy. No adnexal masses. Other: No abdominal wall hernia or abnormality. No abdominopelvic ascites. Musculoskeletal: Degenerative changes affect the spine. 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 IMPRESSION: 1. Moderate fluid distention of the  stomach. 2. Fluid-filled small bowel and colonic loops which can be seen in the setting of enteritis. No bowel obstruction. 3. 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 No follow-up imaging is recommended. JACR 2018 Feb; [id number], Management of the Incidental Renal Mass on CT, [employer] 2021; [id number], Bosniak Classification of Cystic Renal Masses, Version 2019. Aortic Atherosclerosis (ICD10-I70.0). Electronically Signed: By: Ronney Asters M.D. On: 10/05/2022 21:45   DG Chest Portable 1 View  Result Date: 09/29/2022 CLINICAL DATA:  hypotension, generalized weakness EXAM: PORTABLE CHEST 1 VIEW COMPARISON:  Radiograph 08/04/2022, 03/27/2022 FINDINGS: Unchanged cardiomediastinal silhouette. There is no new focal airspace disease. Unchanged upper lung scarring. No pleural effusion. No evidence of pneumothorax. Thoracic spondylosis. Right upper quadrant surgical clips. Unchanged clips overlying the left axilla. Bilateral shoulder degenerative changes. No acute osseous abnormality. IMPRESSION: No evidence of acute cardiopulmonary disease. Electronically Signed   By: Maurine Simmering M.D.   On: 09/29/2022 10:52    Microbiology: Results for orders placed or performed during the hospital encounter of 10/15/22  C Difficile Quick Screen w PCR reflex  Status: Abnormal   Collection Time: 10/15/22  6:15 PM   Specimen: STOOL  Result Value Ref Range Status   C Diff antigen POSITIVE (A) NEGATIVE Final   C Diff toxin NEGATIVE NEGATIVE Final   C Diff interpretation Results are indeterminate. See PCR results.  Final    Comment: Performed at [facility], [address] [place], [place] [address]  Gastrointestinal Panel by PCR , Stool     Status: None   Collection Time: 10/15/22  6:15 PM   Specimen: STOOL  Result Value Ref Range Status   Campylobacter species NOT DETECTED NOT DETECTED Final   Plesimonas shigelloides NOT DETECTED NOT DETECTED Final   Salmonella species NOT DETECTED NOT DETECTED Final   Yersinia  enterocolitica NOT DETECTED NOT DETECTED Final   Vibrio species NOT DETECTED NOT DETECTED Final   Vibrio cholerae NOT DETECTED NOT DETECTED Final   Enteroaggregative E coli (EAEC) NOT DETECTED NOT DETECTED Final   Enteropathogenic E coli (EPEC) NOT DETECTED NOT DETECTED Final   Enterotoxigenic E coli (ETEC) NOT DETECTED NOT DETECTED Final   Shiga like toxin producing E coli (STEC) NOT DETECTED NOT DETECTED Final   Shigella/Enteroinvasive E coli (EIEC) NOT DETECTED NOT DETECTED Final   Cryptosporidium NOT DETECTED NOT DETECTED Final   Cyclospora cayetanensis NOT DETECTED NOT DETECTED Final   Entamoeba histolytica NOT DETECTED NOT DETECTED Final   Giardia lamblia NOT DETECTED NOT DETECTED Final   Adenovirus F40/41 NOT DETECTED NOT DETECTED Final   Astrovirus NOT DETECTED NOT DETECTED Final   Norovirus GI/GII NOT DETECTED NOT DETECTED Final   Rotavirus A NOT DETECTED NOT DETECTED Final   Sapovirus (I, II, IV, and V) NOT DETECTED NOT DETECTED Final    Comment: Performed at [facility], [address] [place], [place] [address]  C. Diff by PCR, Reflexed     Status: Abnormal   Collection Time: 10/15/22  6:15 PM  Result Value Ref Range Status   Toxigenic C. Difficile by PCR POSITIVE (A) NEGATIVE Final    Comment: Positive for toxigenic C. difficile with little to no toxin production. Only treat if clinical presentation suggests symptomatic illness. Performed at [facility], [place] [address] [place], [place] [address]     Labs: CBC: Recent Labs  Lab 10/15/22 1030 10/16/22 0441  WBC 7.5 4.5  NEUTROABS 3.8  --   HGB 11.3* 9.3*  HCT 35.0* 27.7*  MCV 92.3 90.2  PLT 176 122*   Basic Metabolic Panel: Recent Labs  Lab 10/15/22 1030 10/15/22 1649 10/16/22 0441 10/19/22 0745 10/20/22 0506  NA 136  --  138 136 136  K 4.1  --  4.0 4.0 3.9  CL 108  --  112* 111 113*  CO2 19*  --  21* 22 20*  GLUCOSE 86  --  68* 80 79  BUN 23  --  19 18 18   CREATININE 1.60*  --   1.25* 1.81* 1.89*  CALCIUM 8.2*  --  7.8* 7.4* 7.5*  MG 1.9  --  1.8  --   --   PHOS  --  2.8 2.7  --   --    Liver Function Tests: Recent Labs  Lab 10/15/22 1030 10/19/22 0745 10/20/22 0506  AST 39 25 24  ALT 21 12 12   ALKPHOS 94 63 57  BILITOT 1.1 0.7 0.8  PROT 5.2* 3.7* 3.9*  ALBUMIN 2.4* 2.1* 2.5*   CBG: No results for input(s): "GLUCAP" in the last 168 hours.  Discharge time  spent: greater than 40  minutes.  Signed: Deatra James, MD [facility] 10/20/2022

## 2022-10-20 NOTE — Progress Notes (Signed)
Patient stable and ready for discharge home with daughter. Patient's PICC line removed by RN. Writer went over discharge paperwork with patient and daughter and both verbalized understanding. Daughter also spoke with MD prior to discharge on the phone. NT dressed patient and transported patient to the ar via [place].

## 2022-10-21 ENCOUNTER — Other Ambulatory Visit: Payer: Self-pay | Admitting: Internal Medicine

## 2022-10-22 ENCOUNTER — Telehealth: Payer: Self-pay | Admitting: Cardiology

## 2022-10-22 NOTE — Telephone Encounter (Signed)
Spoke to pt's daughter who stated that pt was admitted to the hospital from 11/08-11/13 where pt had labs drawn. Pt's daughter stated HHN mentioned to her that from pt's lab work, pt may have CHF. Pt's Eliquis was also cut in half. Pt weighed 114 lb before she went into the hospital, 93 lb while admitted, and 129 lb on 11/15 per pt's daughter.  Pt's daughter would like provider to review labs and medication changes to see if this is appropriate. Daughter also stated that pt is swollen in her arms/legs- mostly arms- as well.   Please advise.

## 2022-10-22 NOTE — Telephone Encounter (Signed)
Patient's daughter states home health nurse reviewed blood work and assumes the patient may have CHF. Daughter also mentions during recently hospital admission Eliquis was cut in half.

## 2022-10-23 ENCOUNTER — Other Ambulatory Visit: Payer: Self-pay

## 2022-10-23 ENCOUNTER — Emergency Department (HOSPITAL_COMMUNITY): Payer: Medicare Other

## 2022-10-23 ENCOUNTER — Inpatient Hospital Stay (HOSPITAL_COMMUNITY): Admission: RE | Admit: 2022-10-23 | Payer: Medicare Other | Source: Ambulatory Visit | Admitting: Cardiovascular Disease

## 2022-10-23 ENCOUNTER — Encounter (HOSPITAL_COMMUNITY): Payer: Self-pay | Admitting: Internal Medicine

## 2022-10-23 ENCOUNTER — Encounter (HOSPITAL_COMMUNITY): Admission: RE | Payer: Self-pay | Source: Ambulatory Visit

## 2022-10-23 ENCOUNTER — Inpatient Hospital Stay (HOSPITAL_COMMUNITY): Payer: Medicare Other

## 2022-10-23 ENCOUNTER — Inpatient Hospital Stay (HOSPITAL_COMMUNITY)
Admission: EM | Admit: 2022-10-23 | Discharge: 2022-10-25 | DRG: 314 | Disposition: A | Discharge status: Home Health | Payer: Medicare Other | Attending: Family Medicine | Admitting: Family Medicine

## 2022-10-23 DIAGNOSIS — E860 Dehydration: Secondary | ICD-10-CM | POA: Diagnosis present

## 2022-10-23 DIAGNOSIS — Z8249 Family history of ischemic heart disease and other diseases of the circulatory system: Secondary | ICD-10-CM

## 2022-10-23 DIAGNOSIS — K7581 Nonalcoholic steatohepatitis (NASH): Secondary | ICD-10-CM | POA: Diagnosis present

## 2022-10-23 DIAGNOSIS — D649 Anemia, unspecified: Secondary | ICD-10-CM | POA: Diagnosis present

## 2022-10-23 DIAGNOSIS — E1122 Type 2 diabetes mellitus with diabetic chronic kidney disease: Secondary | ICD-10-CM | POA: Diagnosis present

## 2022-10-23 DIAGNOSIS — Z9049 Acquired absence of other specified parts of digestive tract: Secondary | ICD-10-CM

## 2022-10-23 DIAGNOSIS — Z90721 Acquired absence of ovaries, unilateral: Secondary | ICD-10-CM

## 2022-10-23 DIAGNOSIS — N189 Chronic kidney disease, unspecified: Secondary | ICD-10-CM | POA: Diagnosis not present

## 2022-10-23 DIAGNOSIS — N184 Chronic kidney disease, stage 4 (severe): Secondary | ICD-10-CM | POA: Diagnosis present

## 2022-10-23 DIAGNOSIS — E785 Hyperlipidemia, unspecified: Secondary | ICD-10-CM | POA: Diagnosis present

## 2022-10-23 DIAGNOSIS — N179 Acute kidney failure, unspecified: Secondary | ICD-10-CM | POA: Diagnosis present

## 2022-10-23 DIAGNOSIS — E119 Type 2 diabetes mellitus without complications: Secondary | ICD-10-CM

## 2022-10-23 DIAGNOSIS — R791 Abnormal coagulation profile: Secondary | ICD-10-CM | POA: Diagnosis present

## 2022-10-23 DIAGNOSIS — R600 Localized edema: Secondary | ICD-10-CM | POA: Diagnosis present

## 2022-10-23 DIAGNOSIS — I959 Hypotension, unspecified: Secondary | ICD-10-CM | POA: Diagnosis present

## 2022-10-23 DIAGNOSIS — R609 Edema, unspecified: Secondary | ICD-10-CM

## 2022-10-23 DIAGNOSIS — Z87412 Personal history of vulvar dysplasia: Secondary | ICD-10-CM

## 2022-10-23 DIAGNOSIS — J449 Chronic obstructive pulmonary disease, unspecified: Secondary | ICD-10-CM | POA: Diagnosis present

## 2022-10-23 DIAGNOSIS — K766 Portal hypertension: Secondary | ICD-10-CM | POA: Diagnosis present

## 2022-10-23 DIAGNOSIS — D860 Sarcoidosis of lung: Secondary | ICD-10-CM | POA: Diagnosis present

## 2022-10-23 DIAGNOSIS — R571 Hypovolemic shock: Secondary | ICD-10-CM | POA: Diagnosis present

## 2022-10-23 DIAGNOSIS — I480 Paroxysmal atrial fibrillation: Secondary | ICD-10-CM | POA: Diagnosis present

## 2022-10-23 DIAGNOSIS — F321 Major depressive disorder, single episode, moderate: Secondary | ICD-10-CM | POA: Diagnosis present

## 2022-10-23 DIAGNOSIS — K3189 Other diseases of stomach and duodenum: Secondary | ICD-10-CM | POA: Diagnosis present

## 2022-10-23 DIAGNOSIS — R197 Diarrhea, unspecified: Secondary | ICD-10-CM | POA: Diagnosis present

## 2022-10-23 DIAGNOSIS — D631 Anemia in chronic kidney disease: Secondary | ICD-10-CM | POA: Diagnosis present

## 2022-10-23 DIAGNOSIS — I4891 Unspecified atrial fibrillation: Secondary | ICD-10-CM

## 2022-10-23 DIAGNOSIS — D692 Other nonthrombocytopenic purpura: Secondary | ICD-10-CM | POA: Diagnosis present

## 2022-10-23 DIAGNOSIS — E861 Hypovolemia: Secondary | ICD-10-CM | POA: Diagnosis not present

## 2022-10-23 DIAGNOSIS — I2510 Atherosclerotic heart disease of native coronary artery without angina pectoris: Secondary | ICD-10-CM | POA: Diagnosis present

## 2022-10-23 DIAGNOSIS — I9589 Other hypotension: Secondary | ICD-10-CM | POA: Diagnosis not present

## 2022-10-23 DIAGNOSIS — Z853 Personal history of malignant neoplasm of breast: Secondary | ICD-10-CM

## 2022-10-23 DIAGNOSIS — J9811 Atelectasis: Secondary | ICD-10-CM | POA: Diagnosis present

## 2022-10-23 DIAGNOSIS — I129 Hypertensive chronic kidney disease with stage 1 through stage 4 chronic kidney disease, or unspecified chronic kidney disease: Secondary | ICD-10-CM | POA: Diagnosis present

## 2022-10-23 DIAGNOSIS — K649 Unspecified hemorrhoids: Secondary | ICD-10-CM | POA: Diagnosis present

## 2022-10-23 DIAGNOSIS — M810 Age-related osteoporosis without current pathological fracture: Secondary | ICD-10-CM | POA: Diagnosis present

## 2022-10-23 DIAGNOSIS — Z923 Personal history of irradiation: Secondary | ICD-10-CM

## 2022-10-23 DIAGNOSIS — Z961 Presence of intraocular lens: Secondary | ICD-10-CM | POA: Diagnosis present

## 2022-10-23 DIAGNOSIS — E8809 Other disorders of plasma-protein metabolism, not elsewhere classified: Secondary | ICD-10-CM | POA: Diagnosis present

## 2022-10-23 DIAGNOSIS — Z7901 Long term (current) use of anticoagulants: Secondary | ICD-10-CM

## 2022-10-23 DIAGNOSIS — R188 Other ascites: Secondary | ICD-10-CM | POA: Diagnosis present

## 2022-10-23 DIAGNOSIS — K7460 Unspecified cirrhosis of liver: Secondary | ICD-10-CM | POA: Diagnosis present

## 2022-10-23 DIAGNOSIS — M1990 Unspecified osteoarthritis, unspecified site: Secondary | ICD-10-CM | POA: Diagnosis present

## 2022-10-23 DIAGNOSIS — E872 Acidosis, unspecified: Secondary | ICD-10-CM | POA: Diagnosis present

## 2022-10-23 DIAGNOSIS — Z79899 Other long term (current) drug therapy: Secondary | ICD-10-CM

## 2022-10-23 DIAGNOSIS — E890 Postprocedural hypothyroidism: Secondary | ICD-10-CM | POA: Diagnosis present

## 2022-10-23 DIAGNOSIS — Z90710 Acquired absence of both cervix and uterus: Secondary | ICD-10-CM

## 2022-10-23 DIAGNOSIS — K219 Gastro-esophageal reflux disease without esophagitis: Secondary | ICD-10-CM | POA: Diagnosis present

## 2022-10-23 DIAGNOSIS — Z79890 Hormone replacement therapy (postmenopausal): Secondary | ICD-10-CM

## 2022-10-23 DIAGNOSIS — Z888 Allergy status to other drugs, medicaments and biological substances status: Secondary | ICD-10-CM

## 2022-10-23 DIAGNOSIS — Z9012 Acquired absence of left breast and nipple: Secondary | ICD-10-CM

## 2022-10-23 DIAGNOSIS — Z9079 Acquired absence of other genital organ(s): Secondary | ICD-10-CM

## 2022-10-23 DIAGNOSIS — K5909 Other constipation: Secondary | ICD-10-CM | POA: Diagnosis present

## 2022-10-23 DIAGNOSIS — Z22322 Carrier or suspected carrier of Methicillin resistant Staphylococcus aureus: Secondary | ICD-10-CM

## 2022-10-23 HISTORY — DX: Chronic kidney disease, unspecified: N17.9

## 2022-10-23 LAB — COMPREHENSIVE METABOLIC PANEL
ALT: 18 U/L (ref 0–44)
AST: 33 U/L (ref 15–41)
Albumin: 2.9 g/dL — ABNORMAL LOW (ref 3.5–5.0)
Alkaline Phosphatase: 71 U/L (ref 38–126)
Anion gap: 8 (ref 5–15)
BUN: 20 mg/dL (ref 8–23)
CO2: 18 mmol/L — ABNORMAL LOW (ref 22–32)
Calcium: 8.3 mg/dL — ABNORMAL LOW (ref 8.9–10.3)
Chloride: 111 mmol/L (ref 98–111)
Creatinine, Ser: 2.55 mg/dL — ABNORMAL HIGH (ref 0.44–1.00)
GFR, Estimated: 19 mL/min — ABNORMAL LOW (ref >60)
Glucose, Bld: 98 mg/dL (ref 70–99)
Potassium: 3.5 mmol/L (ref 3.5–5.1)
Sodium: 137 mmol/L (ref 135–145)
Total Bilirubin: 1.2 mg/dL (ref 0.3–1.2)
Total Protein: 4.7 g/dL — ABNORMAL LOW (ref 6.5–8.1)

## 2022-10-23 LAB — BRAIN NATRIURETIC PEPTIDE: B Natriuretic Peptide: 328 pg/mL — ABNORMAL HIGH (ref 0.0–100.0)

## 2022-10-23 LAB — HEMOGLOBIN AND HEMATOCRIT, BLOOD
HCT: 33.8 % — ABNORMAL LOW (ref 36.0–46.0)
Hemoglobin: 11.1 g/dL — ABNORMAL LOW (ref 12.0–15.0)

## 2022-10-23 LAB — CBC WITH DIFFERENTIAL/PLATELET
Abs Immature Granulocytes: 0 10*3/uL (ref 0.00–0.07)
Basophils Absolute: 0 10*3/uL (ref 0.0–0.1)
Basophils Relative: 0 %
Eosinophils Absolute: 0.1 10*3/uL (ref 0.0–0.5)
Eosinophils Relative: 2 %
HCT: 26.6 % — ABNORMAL LOW (ref 36.0–46.0)
Hemoglobin: 8.8 g/dL — ABNORMAL LOW (ref 12.0–15.0)
Immature Granulocytes: 0 %
Lymphocytes Relative: 20 %
Lymphs Abs: 1.1 10*3/uL (ref 0.7–4.0)
MCH: 30.1 pg (ref 26.0–34.0)
MCHC: 33.1 g/dL (ref 30.0–36.0)
MCV: 91.1 fL (ref 80.0–100.0)
Monocytes Absolute: 0.6 10*3/uL (ref 0.1–1.0)
Monocytes Relative: 11 %
Neutro Abs: 3.7 10*3/uL (ref 1.7–7.7)
Neutrophils Relative %: 67 %
Platelets: 150 10*3/uL (ref 150–400)
RBC: 2.92 MIL/uL — ABNORMAL LOW (ref 3.87–5.11)
RDW: 17.2 % — ABNORMAL HIGH (ref 11.5–15.5)
WBC: 5.5 10*3/uL (ref 4.0–10.5)
nRBC: 0 % (ref 0.0–0.2)

## 2022-10-23 LAB — LACTIC ACID, PLASMA
Lactic Acid, Venous: 2.4 mmol/L (ref 0.5–1.9)
Lactic Acid, Venous: 1.5 mmol/L (ref 0.5–1.9)

## 2022-10-23 LAB — POC OCCULT BLOOD, ED: Fecal Occult Bld: NEGATIVE

## 2022-10-23 LAB — GLUCOSE, CAPILLARY: Glucose-Capillary: 91 mg/dL (ref 70–99)

## 2022-10-23 LAB — MRSA NEXT GEN BY PCR, NASAL: MRSA by PCR Next Gen: DETECTED — AB

## 2022-10-23 LAB — TROPONIN I (HIGH SENSITIVITY)
Troponin I (High Sensitivity): 15 ng/L (ref <18)
Troponin I (High Sensitivity): 17 ng/L (ref <18)

## 2022-10-23 LAB — MAGNESIUM: Magnesium: 1.8 mg/dL (ref 1.7–2.4)

## 2022-10-23 SURGERY — LEFT ATRIAL APPENDAGE OCCLUSION
Anesthesia: General

## 2022-10-23 MED ORDER — MIDODRINE HCL 5 MG PO TABS
5.0000 mg | ORAL_TABLET | Freq: Three times a day (TID) | ORAL | Status: DC
  Administered 2022-10-24 – 2022-10-25 (×5): 5 mg via ORAL
  Filled 2022-10-23 (×5): qty 1

## 2022-10-23 MED ORDER — MUPIROCIN 2 % EX OINT
1.0000 | TOPICAL_OINTMENT | Freq: Two times a day (BID) | CUTANEOUS | Status: DC
  Administered 2022-10-23 – 2022-10-25 (×4): 1 via NASAL
  Filled 2022-10-23: qty 22

## 2022-10-23 MED ORDER — SODIUM CHLORIDE 0.9 % IV BOLUS
500.0000 mL | Freq: Once | INTRAVENOUS | Status: AC
  Administered 2022-10-23: 500 mL via INTRAVENOUS

## 2022-10-23 MED ORDER — GUAIFENESIN 100 MG/5ML PO LIQD: 5.0000 mL | ORAL | Status: DC | PRN

## 2022-10-23 MED ORDER — ACETAMINOPHEN 650 MG RE SUPP: 650.0000 mg | Freq: Four times a day (QID) | RECTAL | Status: DC | PRN

## 2022-10-23 MED ORDER — CHLORHEXIDINE GLUCONATE CLOTH 2 % EX PADS
6.0000 | MEDICATED_PAD | Freq: Every day | CUTANEOUS | Status: DC
  Administered 2022-10-23 – 2022-10-25 (×3): 6 via TOPICAL

## 2022-10-23 MED ORDER — NOREPINEPHRINE 4 MG/250ML-% IV SOLN: 0.0000 ug/min | INTRAVENOUS | Status: DC

## 2022-10-23 MED ORDER — NOREPINEPHRINE 4 MG/250ML-% IV SOLN
2.0000 ug/min | INTRAVENOUS | Status: DC
  Administered 2022-10-24: 3 ug/min via INTRAVENOUS
  Filled 2022-10-23: qty 250

## 2022-10-23 MED ORDER — POLYETHYLENE GLYCOL 3350 17 G PO PACK: 17.0000 g | PACK | Freq: Every day | ORAL | Status: DC | PRN

## 2022-10-23 MED ORDER — ACETAMINOPHEN 325 MG PO TABS
650.0000 mg | ORAL_TABLET | Freq: Four times a day (QID) | ORAL | Status: DC | PRN
  Administered 2022-10-25: 650 mg via ORAL
  Filled 2022-10-23: qty 2

## 2022-10-23 MED ORDER — POTASSIUM CHLORIDE 20 MEQ PO PACK
40.0000 meq | PACK | Freq: Once | ORAL | Status: AC
  Administered 2022-10-23: 40 meq via ORAL
  Filled 2022-10-23: qty 2

## 2022-10-23 MED ORDER — ONDANSETRON HCL 4 MG PO TABS: 4.0000 mg | ORAL_TABLET | Freq: Four times a day (QID) | ORAL | Status: DC | PRN

## 2022-10-23 MED ORDER — NOREPINEPHRINE 4 MG/250ML-% IV SOLN
0.0000 ug/min | INTRAVENOUS | Status: DC
  Administered 2022-10-23: 2 ug/min via INTRAVENOUS
  Filled 2022-10-23 (×2): qty 250

## 2022-10-23 MED ORDER — SODIUM CHLORIDE 0.9 % IV SOLN: 250.0000 mL | INTRAVENOUS | Status: DC

## 2022-10-23 MED ORDER — ATORVASTATIN CALCIUM 20 MG PO TABS
20.0000 mg | ORAL_TABLET | Freq: Every day | ORAL | Status: DC
  Administered 2022-10-24 – 2022-10-25 (×2): 20 mg via ORAL
  Filled 2022-10-23 (×2): qty 1

## 2022-10-23 MED ORDER — LEVOTHYROXINE SODIUM 100 MCG PO TABS
100.0000 ug | ORAL_TABLET | Freq: Every day | ORAL | Status: DC
  Administered 2022-10-24 – 2022-10-25 (×2): 100 ug via ORAL
  Filled 2022-10-23 (×2): qty 1

## 2022-10-23 MED ORDER — ENSURE ENLIVE PO LIQD
237.0000 mL | Freq: Two times a day (BID) | ORAL | Status: DC
  Administered 2022-10-24 – 2022-10-25 (×3): 237 mL via ORAL

## 2022-10-23 MED ORDER — ONDANSETRON HCL 4 MG/2ML IJ SOLN: 4.0000 mg | Freq: Four times a day (QID) | INTRAMUSCULAR | Status: DC | PRN

## 2022-10-23 NOTE — Assessment & Plan Note (Addendum)
Hemoglobin 8.8,  gradual down trend from 12.3- 3 weeks ago.   No melena, no hematochezia or hematemesis.  Hypotensive.  Stool occult negative.  She has extensive ecchymosis to her extremities, she denies falls. -Obtain CT abdomen and pelvis without contrast, rule out intra-abdominal hemorrhage -With complaints of dyspnea, she was transfused in the ED - Monitor hemoglobin -Hold Eliquis for now pending CBC in a.m. and CT findings

## 2022-10-23 NOTE — Assessment & Plan Note (Signed)
-   HgbA1c- 4.3 -Diet controlled

## 2022-10-23 NOTE — Assessment & Plan Note (Signed)
Reports edema to bilateral lower extremities more so than upper extremities.  Likely related to hypoalbuminemia 2.9, NASH liver cirrhosis.  Received albumin twice during recent hospitalization - Hold off on diuresis for now with initial hypotension -Obtain echo -May benefit from IV albumin prior to discharge

## 2022-10-23 NOTE — ED Triage Notes (Signed)
Pt arrived via [facility] c/o SOB and bilateral leg swelling. Recently diagnosed with CHF, but has not started any fluid pills, Per EMS was also hypotensive

## 2022-10-23 NOTE — Plan of Care (Signed)

## 2022-10-23 NOTE — Assessment & Plan Note (Addendum)
Hypotension, blood pressure down to 77/38, 1 L bolus given, subsequently started on Levophed drip.  Blood pressure now 120s over 55.  Recent hospitalization for same-complaint of diarrhea.  Etiology this time likely related to recent diarrhea and dehydration, hypoalbuminemia, and antihypertensives. -Wean off peripheral Levophed -Hold Cardizem, lisinopril HCTZ -Resume midodrine 5 mg 3 times daily

## 2022-10-23 NOTE — ED Notes (Signed)
After second bolus of a total of 1,000 mL of NaCl, pt's BP is now 93/39 (54) --PA-C made aware

## 2022-10-23 NOTE — Assessment & Plan Note (Addendum)
AKI on CKD 4.  Creatinine 2.5.  Baseline about 1.8.  Likely prerenal from hypotension. -1 unit transfused, 1 unit of blood given. -Hold lisinopril- HCTZ

## 2022-10-23 NOTE — ED Provider Notes (Signed)
[facility] EMERGENCY DEPARTMENT Provider Note   CSN: [id number] Arrival date & time: 10/23/22  1403     History  Chief Complaint  Patient presents with   Shortness of Breath   HPI Tasha Roberts is a 81 y.o. female with CAD, AF, HTN, DM II, and remote h/o breast cancer presenting for shortness of breath and peripheral swelling. Symptoms started early this morning and all of a sudden. Both arms and legs swollen. Shortness of breath worse with movement. Denies chest pain. Denies fever and cough. States that she is on blood thinners and that bruising on her arms and legs is not new.    Shortness of Breath      Home Medications Prior to Admission medications   Medication Sig Start Date End Date Taking? Authorizing Provider  acidophilus (RISAQUAD) CAPS capsule Take 2 capsules by mouth 3 (three) times daily for 10 days. 10/20/22 10/30/22 Yes Shahmehdi, Valeria Batman, MD  apixaban (ELIQUIS) 2.5 MG TABS tablet Take 1 tablet (2.5 mg total) by mouth 2 (two) times daily. 10/20/22  Yes Shahmehdi, Seyed A, MD  atorvastatin (LIPITOR) 20 MG tablet TAKE (1) TABLET BY MOUTH ONCE DAILY. Patient taking differently: Take 20 mg by mouth daily. 03/05/22  Yes Lindell Spar, MD  Cholecalciferol 50 MCG (2000 UT) CAPS Take 1 capsule (2,000 Units total) by mouth daily. 03/30/22  Yes Kc, Maren Beach, MD  diltiazem (CARDIZEM CD) 120 MG 24 hr capsule TAKE (1) CAPSULE BY MOUTH ONCE DAILY. Patient taking differently: Take 120 mg by mouth daily. 05/16/22  Yes BranchAlphonse Guild, MD  feeding supplement (ENSURE ENLIVE / ENSURE PLUS) LIQD Take 237 mLs by mouth 2 (two) times daily between meals. 07/28/22  Yes Manuella Ghazi, Pratik D, DO  fidaxomicin (DIFICID) 200 MG TABS tablet Take 1 tablet (200 mg total) by mouth 2 (two) times daily for 10 days 10/16/22  Yes Shahmehdi, Valeria Batman, MD  levothyroxine (SYNTHROID) 100 MCG tablet Take 1 tablet (100 mcg total) by mouth daily before breakfast. 06/09/22  Yes Posey Pronto, Colin Broach, MD   lisinopril-hydrochlorothiazide (ZESTORETIC) 20-25 MG tablet TAKE (1) TABLET BY MOUTH EACH MORNING. Patient taking differently: Take 1 tablet by mouth daily. 10/21/22  Yes Lindell Spar, MD  midodrine (PROAMATINE) 5 MG tablet Take 1 tablet (5 mg total) by mouth 3 (three) times daily with meals for 10 days. 10/20/22 10/30/22 Yes Shahmehdi, Valeria Batman, MD  nitroGLYCERIN (NITROSTAT) 0.4 MG SL tablet Place 1 tablet (0.4 mg total) under the tongue every 5 (five) minutes as needed for chest pain. 07/31/21  Yes Lindell Spar, MD  Nystatin (GERHARDT'S BUTT CREAM) CREA Apply 1 Application topically 3 (three) times daily. 10/20/22 11/19/22 Yes Shahmehdi, Seyed A, MD  ondansetron (ZOFRAN-ODT) 4 MG disintegrating tablet Take 1 tablet (4 mg total) by mouth every 8 (eight) hours as needed for nausea or vomiting. 10/20/22  Yes Shahmehdi, Seyed A, MD  tolterodine (DETROL LA) 4 MG 24 hr capsule Take 1 capsule (4 mg total) by mouth daily. 08/28/22  Yes McKenzie, Candee Furbish, MD  vitamin B-12 (CYANOCOBALAMIN) 500 MCG tablet Take 500 mcg by mouth daily.   Yes [provider]  fiber supplement, BANATROL TF, liquid Place 60 mLs into feeding tube 2 (two) times daily for 10 days. Patient not taking: Reported on 10/23/2022 10/20/22 10/30/22  Deatra James, MD  Multiple Vitamin (MULTIVITAMIN WITH MINERALS) TABS tablet Take 1 tablet by mouth daily. Patient not taking: Reported on 10/23/2022 10/21/22 11/20/22  Deatra James, MD  Allergies    Dicyclomine, Glucophage [metformin], and Losartan    Review of Systems   Review of Systems  Respiratory:  Positive for shortness of breath.     Physical Exam Updated Vital Signs BP (!) 106/49   Pulse 76   Temp 98 F (36.7 C) (Oral)   Resp 18   Ht 5' 3"  (1.6 m)   Wt 42.6 kg   SpO2 100%   BMI 16.65 kg/m  Physical Exam Vitals and nursing note reviewed.  HENT:     Head: Normocephalic and atraumatic.     Mouth/Throat:     Mouth: Mucous membranes are  moist.  Eyes:     General:        Right eye: No discharge.        Left eye: No discharge.     Conjunctiva/sclera: Conjunctivae normal.  Cardiovascular:     Rate and Rhythm: Normal rate and regular rhythm.     Pulses:          Radial pulses are 1+ on the right side and 1+ on the left side.       Dorsalis pedis pulses are 1+ on the right side and 2+ on the left side.     Heart sounds: Normal heart sounds.  Pulmonary:     Effort: Pulmonary effort is normal.     Breath sounds: Normal breath sounds. No decreased breath sounds, wheezing, rhonchi or rales.  Abdominal:     General: Abdomen is flat.     Palpations: Abdomen is soft.  Musculoskeletal:     Right upper arm: Edema present.     Left upper arm: Edema present.     Right forearm: Edema present.     Left forearm: Edema present.     Right lower leg: Edema present.     Left lower leg: Edema present.     Right ankle: Swelling and ecchymosis present.     Left ankle: Swelling and ecchymosis present.  Skin:    General: Skin is warm and dry.     Coloration: Skin is pale.  Neurological:     General: No focal deficit present.  Psychiatric:        Mood and Affect: Mood normal.     ED Results / Procedures / Treatments   Labs (all labs ordered are listed, but only abnormal results are displayed) Labs Reviewed  COMPREHENSIVE METABOLIC PANEL - Abnormal; Notable for the following components:      Result Value   CO2 18 (*)    Creatinine, Ser 2.55 (*)    Calcium 8.3 (*)    Total Protein 4.7 (*)    Albumin 2.9 (*)    GFR, Estimated 19 (*)    All other components within normal limits  LACTIC ACID, PLASMA - Abnormal; Notable for the following components:   Lactic Acid, Venous 2.4 (*)    All other components within normal limits  CBC WITH DIFFERENTIAL/PLATELET - Abnormal; Notable for the following components:   RBC 2.92 (*)    Hemoglobin 8.8 (*)    HCT 26.6 (*)    RDW 17.2 (*)    All other components within normal limits  BRAIN  NATRIURETIC PEPTIDE - Abnormal; Notable for the following components:   B Natriuretic Peptide 328.0 (*)    All other components within normal limits  LACTIC ACID, PLASMA  POC OCCULT BLOOD, ED  TYPE AND SCREEN  PREPARE RBC (CROSSMATCH)  TROPONIN I (HIGH SENSITIVITY)  TROPONIN I (HIGH SENSITIVITY)  EKG EKG Interpretation  Date/Time:  Thursday October 23 2022 14:36:44 EST Ventricular Rate:  62 PR Interval:  45 QRS Duration: 97 QT Interval:  434 QTC Calculation: 441 R Axis:   23 Text Interpretation: Sinus rhythm Short PR interval Low voltage, precordial leads Borderline T abnormalities, diffuse leads No significant change since last tracing Confirmed by Isla Pence [phone]) on 10/23/2022 4:23:01 PM  Radiology DG Chest Port 1 View  Result Date: 10/23/2022 CLINICAL DATA:  Shortness of breath, BILATERAL leg swelling, recent diagnosis of CHF EXAM: PORTABLE CHEST 1 VIEW COMPARISON:  Portable exam 1441 hours compared to 10/16/2022 FINDINGS: Upper normal heart size. Mediastinal contours and pulmonary vascularity normal. 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 Mild LEFT basilar atelectasis. No pulmonary infiltrate, pleural effusion, or pneumothorax. Bones demineralized. IMPRESSION: COPD changes with LEFT basilar atelectasis. Emphysema (ICD10-J43.9). Electronically Signed   By: Lavonia Dana M.D.   On: 10/23/2022 14:48    Procedures .Critical Care  Performed by: Harriet Pho, PA-C Authorized by: Harriet Pho, PA-C   Critical care provider statement:    Critical care time (minutes):  30   Critical care was necessary to treat or prevent imminent or life-threatening deterioration of the following conditions:  Shock (hypotension)   Critical care was time spent personally by me on the following activities:  Development of treatment plan with patient or surrogate, evaluation of patient's response to treatment, examination of patient, interpretation of cardiac output  measurements, blood draw for specimens, ordering and performing treatments and interventions, ordering and review of laboratory studies, ordering and review of radiographic studies, pulse oximetry, re-evaluation of patient's condition and review of old charts   Care discussed with: admitting provider       Medications Ordered in ED Medications  norepinephrine (LEVOPHED) 49m in 2537m(0.016 mg/mL) premix infusion (3 mcg/min Intravenous Infusion Verify 10/23/22 1859)  sodium chloride 0.9 % bolus 500 mL (0 mLs Intravenous Stopped 10/23/22 1622)  sodium chloride 0.9 % bolus 500 mL (0 mLs Intravenous Stopped 10/23/22 1656)    ED Course/ Medical Decision Making/ A&P Clinical Course as of 10/23/22 1915  Thu Oct 23, 2022  1418 BP(!): 77/38 [JR]  1911 BP 106/49 [JR]    Clinical Course User Index [JR] RoHarriet PhoPA-C                           Medical Decision Making Amount and/or Complexity of Data Reviewed Labs: ordered. Radiology: ordered.  Risk Prescription drug management.   This patient presents to the ED for concern of shortness of breath and peripheral edema, this involves a number of treatment options, and is a complaint that carries with it a high risk of complications and morbidity. The differential diagnosis includes CHF, third spacing, ACS, PE, and pneumonia.    Co morbidities: Discussed in HPI   EMR reviewed including pt PMHx, past surgical history and past visits to ER.   See HPI for more details   Lab Tests:   I ordered and independently interpreted labs. Labs notable for lactic acidosis, elevated BNP, hypoalbuminemia, and elevated creatinine, anemia   Imaging Studies:  Abnormal findings. I personally viewed and interpreted all imaging studies. Imaging notable for left basilar atelectasis.    Cardiac Monitoring:  The patient was maintained on a cardiac monitor.  I personally viewed and interpreted the cardiac monitored which showed an underlying  rhythm of: NSR EKG non-ischemic   Medicines ordered:  I ordered medication including  NS bolus for volume resuscitation.  Levophed for hypotension and 1 unit of PRBC for anemia. Reevaluation of the patient after these medicines showed that the patient improved I have reviewed the patients home medicines and have made adjustments as needed    Consults/Attending Physician   I discussed this case with my attending physician who cosigned this note including patient's presenting symptoms, physical exam, and planned diagnostics and interventions. Attending physician stated agreement with plan or made changes to plan which were implemented.   Reevaluation:  After the interventions noted above I re-evaluated patient and found that they have :stayed the same   Problem List / ED Course: Patient presented for SOB and peripheral edema. On exam, patient upper and lower were obviously edematous and hypotensive.  Lung sounds were CTAB.  Initial concern was heart failure but BNP was actually reduced in comparison to last draw a few days ago.  X-ray did not reveal signs of pulmonary congestion or effusions and patient denies chest pain or shortness of breath making heart failure less likely.  Noted on CMP that her albumin is 2.9.  Given that she had an acute onset of peripheral edema in setting of hypoalbuminemia it is likely that she is third spacing due to lower intravascular oncotic pressure.  Patient was hypotensive on arrival, after volume resuscitation with normal saline and PRBCs along with addition of Levophed patient's blood pressure improved dramatically.  The hospital service with Dr. Denton Brick.   Critical care time for hypovolemic shock requiring volume resuscitation and and pressors.   Dispostion:  After consideration of the diagnostic results and the patients response to treatment, I feel that the patient would benefit from admission to the hospital for acute peripheral edema and  hypotension.          Final Clinical Impression(s) / ED Diagnoses Final diagnoses:  Hypotension, unspecified hypotension type  Peripheral edema    Rx / DC Orders ED Discharge Orders     None         Harriet Pho, PA-C 10/23/22 Bertell Maria, MD 10/24/22 1700

## 2022-10-23 NOTE — H&P (Signed)
History and Physical    Tasha Roberts FMB:[id number] DOB: 1941/05/21 DOA: 10/23/2022  PCP: Lindell Spar, MD   Patient coming from: Home  I have personally briefly reviewed patient's old medical records in [place]  Chief Complaint: Swelling to lower and upper extremities  HPI: Tasha Roberts is a 81 y.o. female with medical history significant for Tasha Roberts, hypertension, diabetes mellitus, coronary disease, atrial fibrillation on chronic anticoagulation. Patient presented to the ED with complaints of swelling to her bilateral upper and lower extremities, that she noticed today.  She denies baseline swelling.  She has significant purpura especially to right upper extremity and left lower extremity this is chronic, unchanged.  She has mild abdominal bloating.  No dizziness.  No chest pain.  Per triage notes, patient had difficulty breathing, but patient denies this.  Denies black stools.  No blood in stools. Reports chronic poor oral intake.  No vomiting.  Reports when she was recently in the hospital she had multiple episodes of diarrhea this has resolved.  No chills.  Recent hospitalization 11/8 to 11/13 for C. difficile colitis/diarrhea, with dehydration and hypotension.  He was treated with Dificid 200 mg twice daily for 10 to 14 days.  CT 10/29 was concerning for colitis/enteritis.  Also with hypoalbuminemia, she received IV albumin twice during the hospitalization.  ED Course: Hypotensive blood pressure down to 77/38, she received 1 L bolus with initial improvement to 90s and subsequent drop so she was started on peripheral Levophed.  1 unit of blood also started  in the ED. Albumin low 2.9. Lactic acid 2.4 improved to 1.5 after Levophed. BNP 328. Cr elevated at 2.5. X-ray Chest- COPD, with left base atelectasis.   Review of Systems: As per HPI all other systems reviewed and negative.  Past Medical History:  Diagnosis Date   Age related osteoporosis    Anemia due to blood loss     iron infusion's last one 03-25-2021 (pt bleeding post vulvar bx 02-11-2021, hospiatal admission 02-21-2021)   Anticoagulant long-term use    eliquis--- managed by pcp    Arthritis    hands   Bronchiectasis [facility])    pulmonology --- dr Halford Chessman   (04-01-2021  pt stated no supplemental oxygen use since 01/ 2022 and no inhaler use,  stated checks O2 at home during the day 98-99%)   Cancer ([place]) 2013   Lumpectomy left breast   Cataract 2015   Chronic constipation    Clotting disorder ([place])    On eliquis   Coronary artery calcification    per lexiscan 08-17-2019 result in care everywhere   Depression, major, single episode, moderate ([place]) 12/05/2021   DOE (dyspnea on exertion)    04-01-2021  per pt only going up stairs    Gastroesophageal reflux disease 12/05/2021   History of diabetes mellitus, type II    04-01-2021  per pt no issues after weight loss, last taken medication approx 2017   History of Graves' disease 2017   s/p RAI   History of left breast cancer    dx 2013,  s/p left partial mastecotmy w/ node dissection 09-15-2012, low grade DCIS, no chemo, completed radiation 02/ 2014,  per pt no recurrence   History of respiratory syncytial virus (RSV) infection 09/2020   w/ hypoxia  , hospital admission 10/ 2021  and acute exacerbation bronchiectasis;  follow up post hospital pulmonogy dr Halford Chessman  note in epic   Hyperlipidemia    Hypertension    followed by pcp   (  pt had lexiscan in care everywhere dated 08-17-2019 no ischemia , non-obstructive extensive calcification, ef 69%)   Hypothyroidism    Hypothyroidism, postradioiodine therapy    followed by pcp   Liver cirrhosis secondary to NASH (nonalcoholic steatohepatitis) ([place])    followed by dr Merrilee Jansky ([facility])---- dx 2004,  compensated ,  last liver bx (03/ 2015) fibrosis stage 3;  moderate portal hypertensive gastrophy   PAF (paroxysmal atrial fibrillation) ([place]) previous cardiologist lov note w/ Suann Larry PA  ([place] [facility] in [place]) dated 01-24-2020 in care everywhere;  (04-01-2021 per pt has appt w/ new cardiologist , dr j. 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 kDelton Coombes  VIN III (vulvar intraepithelial neoplasia III)     Past Surgical History:  Procedure Laterality Date   [place]  BREAST SURGERY  09/15/2012   CATARACT EXTRACTION W/ INTRAOCULAR LENS  IMPLANT, BILATERAL  2016   CHOLECYSTECTOMY OPEN  11[address]  CO2 LASER APPLICATION N/A [id number]/7106  Procedure: CO2 LASER APPLICATION OF THE VULVA;  Surgeon: REveritt Amber MD;  Location: [facility]  Service: Gynecology;  Laterality: N/A;   COLONOSCOPY WITH ESOPHAGOGASTRODUODENOSCOPY (EGD)  last one 08/ 2018  @[facility]    EYE SURGERY  2015   Cataract removed both eyes   LUMBAR LAMINECTOMY  2009   PARTIAL MASTECTOMY WITH AXILLARY SENTINEL LYMPH [facility]DE BIOPSY [facility]  TUBAL LIGATION  08/08/1969     reports that she has never smoked. She has never used smokeless tobacco. She reports that she does not currently use alcohol. She reports that she does not use drugs.  Allergies  Allergen Reactions   Dicyclomine Other (See Comments)    Dry cough   Glucophage [Metformin] Diarrhea   Losartan Other (See Comments)    Heartburn     Family History  Problem Relation Age of Onset   Hypertension  Mother    Dementia Mother    Vision loss Mother    Heart disease Father    Hearing loss Father    Stroke Maternal Grandfather    Heart disease Brother    Prior to Admission medications   Medication Sig Start Date End Date Taking? Authorizing Provider  acidophilus (RISAQUAD) CAPS capsule Take 2 capsules by mouth 3 (three) times daily for 10 days. 10/20/22 10/30/22 Yes Shahmehdi, SValeria Batman MD  apixaban (ELIQUIS) 2.5 MG TABS tablet Take 1 tablet (2.5 mg total) by mouth 2 (two) times daily. 10/20/22  Yes Shahmehdi, Seyed A, MD  atorvastatin (LIPITOR) 20 MG tablet TAKE (1) TABLET BY MOUTH ONCE DAILY. Patient taking differently: Take 20 mg by mouth daily. 03/05/22  Yes PLindell Spar MD  Cholecalciferol 50 MCG (2000 UT) CAPS Take 1 capsule (2,000 Units total) by mouth daily. 03/30/22  Yes KAntonieta Pert MD  diltiazem (CARDIZEM  CD) 120 MG 24 hr capsule TAKE (1) CAPSULE BY MOUTH ONCE DAILY. Patient taking differently: Take 120 mg by mouth daily. 05/16/22  Yes BranchAlphonse Guild, MD  feeding supplement (ENSURE ENLIVE / ENSURE PLUS) LIQD Take 237 mLs by mouth 2 (two) times daily between meals. 07/28/22  Yes Manuella Ghazi, Pratik D, DO  fidaxomicin (DIFICID) 200 MG TABS tablet Take 1 tablet (200 mg total) by mouth 2 (two) times daily for 10 days 10/16/22  Yes Shahmehdi, Valeria Batman, MD  levothyroxine (SYNTHROID) 100 MCG tablet Take 1 tablet (100 mcg total) by mouth daily before breakfast. 06/09/22  Yes Posey Pronto, Colin Broach, MD  lisinopril-hydrochlorothiazide (ZESTORETIC) 20-25 MG tablet TAKE (1) TABLET BY MOUTH EACH MORNING. Patient taking differently: Take 1 tablet by mouth daily. 10/21/22  Yes Lindell Spar, MD  midodrine (PROAMATINE) 5 MG tablet Take 1 tablet (5 mg total) by mouth 3 (three) times daily with meals for 10 days. 10/20/22 10/30/22 Yes Shahmehdi, Valeria Batman, MD  nitroGLYCERIN (NITROSTAT) 0.4 MG SL tablet Place 1 tablet (0.4 mg total) under the tongue every 5 (five) minutes as needed for chest pain. 07/31/21  Yes Lindell Spar, MD  Nystatin (GERHARDT'S BUTT CREAM) CREA Apply 1 Application topically 3 (three) times daily. 10/20/22 11/19/22 Yes Shahmehdi, Seyed A, MD  ondansetron (ZOFRAN-ODT) 4 MG disintegrating tablet Take 1 tablet (4 mg total) by mouth every 8 (eight) hours as needed for nausea or vomiting. 10/20/22  Yes Shahmehdi, Seyed A, MD  tolterodine (DETROL LA) 4 MG 24 hr capsule Take 1 capsule (4 mg total) by mouth daily. 08/28/22  Yes McKenzie, Candee Furbish, MD  vitamin B-12 (CYANOCOBALAMIN) 500 MCG tablet Take 500 mcg by mouth daily.   Yes [provider]  fiber supplement, BANATROL TF, liquid Place 60 mLs into feeding tube 2 (two) times daily for 10 days. Patient not taking: Reported on 10/23/2022 10/20/22 10/30/22  Deatra James, MD  Multiple Vitamin (MULTIVITAMIN WITH MINERALS) TABS tablet Take 1 tablet by mouth daily. Patient not taking: Reported on 10/23/2022 10/21/22 11/20/22  Deatra James, MD    Physical Exam: Vitals:   10/23/22 1830 10/23/22 1835 10/23/22 1839 10/23/22 1840  BP: (!) 103/53     Pulse: 65 65 68 71  Resp: 18 18 19 16   Temp:      TempSrc:      SpO2: 100% 100% 100% 100%  Weight:      Height:        Constitutional: calm, comfortable Vitals:   10/23/22 1830 10/23/22 1835 10/23/22 1839 10/23/22 1840  BP: (!) 103/53     Pulse: 65 65 68 71  Resp: 18 18 19 16   Temp:      TempSrc:      SpO2: 100% 100% 100% 100%  Weight:      Height:       Eyes: PERRL, lids and conjunctivae normal ENMT: Mucous membranes are dry.  Neck: normal, supple, no masses, no thyromegaly Respiratory: clear to auscultation bilaterally, no wheezing, no crackles. Normal respiratory effort. No accessory muscle use.  Cardiovascular: Regular rate and rhythm, no murmurs / rubs / gallops.  2+ pitting extremity edema to knees bilaterally.  Extremities warm. Abdomen: Full, soft, no tenderness, no masses palpated. No hepatosplenomegaly. Bowel sounds positive.  Musculoskeletal: no clubbing  / cyanosis. No joint deformity upper and lower extremities. Good ROM, no contractures. Normal muscle tone.  Skin: Extensive ecchymosis chronic and unchanged to extremities worsening right upper extremity and left lower extremity, no  ulcers. No induration Neurologic: No apparent cranial abnormality, benign extremities spontaneously. Psychiatric: Normal judgment and insight. Alert and oriented x 3. Normal mood.   Labs on Admission: I have personally reviewed following labs and imaging studies  CBC: Recent Labs  Lab 10/23/22 1430  WBC 5.5  NEUTROABS 3.7  HGB 8.8*  HCT 26.6*  MCV 91.1  PLT 176   Basic Metabolic Panel: Recent Labs  Lab 10/19/22 0745 10/20/22 0506 10/23/22 1430  NA 136 136 137  K 4.0 3.9 3.5  CL 111 113* 111  CO2 22 20* 18*  GLUCOSE 80 79 98  BUN 18 18 20   CREATININE 1.81* 1.89* 2.55*  CALCIUM 7.4* 7.5* 8.3*   GFR: Estimated Creatinine Clearance: 12 mL/min (A) (by C-G formula based on SCr of 2.55 mg/dL (H)). Liver Function Tests: Recent Labs  Lab 10/19/22 0745 10/20/22 0506 10/23/22 1430  AST 25 24 33  ALT 12 12 18   ALKPHOS 63 57 71  BILITOT 0.7 0.8 1.2  PROT 3.7* 3.9* 4.7*  ALBUMIN 2.1* 2.5* 2.9*   Radiological Exams on Admission: DG Chest Port 1 View  Result Date: 10/23/2022 CLINICAL DATA:  Shortness of breath, BILATERAL leg swelling, recent diagnosis of CHF EXAM: PORTABLE CHEST 1 VIEW COMPARISON:  Portable exam 1441 hours compared to 10/16/2022 FINDINGS: Upper normal heart size. Mediastinal contours and pulmonary vascularity normal. 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 Mild LEFT basilar atelectasis. No pulmonary infiltrate, pleural effusion, or pneumothorax. Bones demineralized. IMPRESSION: COPD changes with LEFT basilar atelectasis. Emphysema (ICD10-J43.9). Electronically Signed   By: Lavonia Dana M.D.   On: 10/23/2022 14:48    EKG: Independently reviewed.  Sinus rhythm, rate 62.  QTc 441.  No significant ST or T wave  abnormalities.  Assessment/Plan Principal Problem:   Hypotension Active Problems:   Acute anemia   AF (paroxysmal atrial fibrillation) (HCC)   Extremity edema   Liver cirrhosis secondary to NASH (H[facility])   DM II (diabetes mellitus, type II), controlled ([place])   Assessment and Plan: * Hypotension Hypotension, blood pressure down to 77/38, 1 L bolus given, subsequently started on Levophed drip.  Blood pressure now 120s over 55.  Recent hospitalization for same-complaint of diarrhea.  Etiology this time likely related to recent diarrhea and dehydration, hypoalbuminemia, and antihypertensives. -Wean off peripheral Levophed -Hold Cardizem, lisinopril HCTZ -Resume midodrine 5 mg 3 times daily  Acute anemia Hemoglobin 8.8,  gradual down trend from 12.3- 3 weeks ago.   No melena, no hematochezia or hematemesis.  Hypotensive.  Stool occult negative.  She has extensive ecchymosis to her extremities, she denies falls. -Obtain CT abdomen and pelvis without contrast, rule out intra-abdominal hemorrhage -With complaints of dyspnea, she was transfused in the ED - Monitor hemoglobin -Hold Eliquis for now pending CBC in a.m. and CT findings  Extremity edema Reports edema to bilateral lower extremities more so than upper extremities.  Likely related to hypoalbuminemia 2.9, NASH liver cirrhosis.  Received albumin twice during recent hospitalization - Hold off on diuresis for now with initial hypotension -Obtain echo -May benefit from IV albumin prior to discharge   AF (paroxysmal atrial fibrillation) ([place]) Rate controlled and on anticoagulation with Eliquis. -Hold Eliquis for now with acute anemia and hypotension -Hold Cardizem  Acute kidney injury superimposed on chronic kidney disease ([place]) AKI on CKD 4.  Creatinine 2.5.  Baseline about 1.8.  Likely prerenal from hypotension. -1 unit transfused, 1 unit of blood given. -Hold lisinopril- HCTZ  DM II (diabetes mellitus, type II), controlled  (  [place]) - HgbA1c- 4.3 -Diet controlled  DVT prophylaxis: SCDS Code Status:  FUll code Family Communication: None at bedside Disposition Plan: ~ 2 days Consults called:  None Admission status: Inpt ICU I certify that at the point of admission it is my clinical judgment that the patient will require inpatient hospital care spanning beyond 2 midnights from the point of admission due to high intensity of service, high risk for further deterioration and high frequency of surveillance required.    Author: Bethena Roys, MD 10/23/2022 10:32 PM  For on call review [email or web address].

## 2022-10-23 NOTE — Assessment & Plan Note (Signed)
Rate controlled and on anticoagulation with Eliquis. -Hold Eliquis for now with acute anemia and hypotension -Hold Cardizem

## 2022-10-23 NOTE — ED Notes (Signed)
Pt states she is a hard stick and last time she was admitted to the hospital she had to have a picc line placed, an RN certified in ultrasound IV will attempt to obtain IV access, pt's BP is also low at 77/38--PA-C made aware

## 2022-10-24 ENCOUNTER — Other Ambulatory Visit (HOSPITAL_COMMUNITY): Payer: Self-pay | Admitting: *Deleted

## 2022-10-24 ENCOUNTER — Inpatient Hospital Stay (HOSPITAL_COMMUNITY): Payer: Medicare Other

## 2022-10-24 DIAGNOSIS — K7581 Nonalcoholic steatohepatitis (NASH): Secondary | ICD-10-CM | POA: Diagnosis not present

## 2022-10-24 DIAGNOSIS — R600 Localized edema: Secondary | ICD-10-CM

## 2022-10-24 DIAGNOSIS — I9589 Other hypotension: Secondary | ICD-10-CM | POA: Diagnosis not present

## 2022-10-24 DIAGNOSIS — N179 Acute kidney failure, unspecified: Secondary | ICD-10-CM | POA: Diagnosis not present

## 2022-10-24 DIAGNOSIS — I480 Paroxysmal atrial fibrillation: Secondary | ICD-10-CM | POA: Diagnosis not present

## 2022-10-24 LAB — PROTIME-INR
INR: 2 — ABNORMAL HIGH (ref 0.8–1.2)
Prothrombin Time: 22.7 seconds — ABNORMAL HIGH (ref 11.4–15.2)

## 2022-10-24 LAB — ECHOCARDIOGRAM COMPLETE
AR max vel: 2.51 cm2
AV Area VTI: 2.9 cm2
AV Area mean vel: 2.93 cm2
AV Mean grad: 2.7 mmHg
AV Peak grad: 6.7 mmHg
Ao pk vel: 1.3 m/s
Area-P 1/2: 2.52 cm2
Height: 63 in
S' Lateral: 2.9 cm
Weight: 2204.6 oz

## 2022-10-24 LAB — BASIC METABOLIC PANEL
Anion gap: 6 (ref 5–15)
BUN: 20 mg/dL (ref 8–23)
CO2: 18 mmol/L — ABNORMAL LOW (ref 22–32)
Calcium: 8 mg/dL — ABNORMAL LOW (ref 8.9–10.3)
Chloride: 112 mmol/L — ABNORMAL HIGH (ref 98–111)
Creatinine, Ser: 2.34 mg/dL — ABNORMAL HIGH (ref 0.44–1.00)
GFR, Estimated: 21 mL/min — ABNORMAL LOW (ref >60)
Glucose, Bld: 88 mg/dL (ref 70–99)
Potassium: 4.2 mmol/L (ref 3.5–5.1)
Sodium: 136 mmol/L (ref 135–145)

## 2022-10-24 LAB — CBC
HCT: 29.6 % — ABNORMAL LOW (ref 36.0–46.0)
Hemoglobin: 10.1 g/dL — ABNORMAL LOW (ref 12.0–15.0)
MCH: 29.4 pg (ref 26.0–34.0)
MCHC: 34.1 g/dL (ref 30.0–36.0)
MCV: 86 fL (ref 80.0–100.0)
Platelets: 114 10*3/uL — ABNORMAL LOW (ref 150–400)
RBC: 3.44 MIL/uL — ABNORMAL LOW (ref 3.87–5.11)
RDW: 18.6 % — ABNORMAL HIGH (ref 11.5–15.5)
WBC: 5.5 10*3/uL (ref 4.0–10.5)
nRBC: 0 % (ref 0.0–0.2)

## 2022-10-24 MED ORDER — FIDAXOMICIN 200 MG PO TABS
200.0000 mg | ORAL_TABLET | Freq: Two times a day (BID) | ORAL | Status: DC
  Administered 2022-10-24 – 2022-10-25 (×3): 200 mg via ORAL
  Filled 2022-10-24 (×7): qty 1

## 2022-10-24 NOTE — Consult Note (Addendum)
Gastroenterology Consult   Referring Provider: No ref. provider found Primary Care Physician:  Lindell Spar, MD Primary Gastroenterologist:  Dr.  Patient ID: Tasha Roberts; [id number]; Sep 27, 1941   Admit date: 10/23/2022  LOS: 1 day   Date of Consultation: 10/24/2022  Reason for Consultation:  recent Cdiff colitis, recent GI bleed, NASH cirrhosis  History of Present Illness   Tasha Roberts is a 81 y.o. 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  She presented to the ED with complaint of bilateral upper and lower extremity swelling as well as significant bruising and mild abdominal bloating.  GI consulted given concern for possible GI bleed and cirrhosis recommendations.  ED Course: Hypotensive with BP 77/38, given 1 L bolus with improvement in BP but had a subsequent drop and was started on Levophed.  Also given 1 unit PRBC. Hemoglobin 8.8, MCV 91.1, platelets 150, albumin 2.9, lactic acid 2.4, BNP 328, creatinine 2.5, sodium 137, bilirubin 1.2 Chest x-ray with COPD and left base atelectasis Stool Hemoccult negative   Consult: Follows with [facility].  Visit 01/06/2022 where she was seen by transplant.  Reported newly diagnosed with Karlene Lineman in 2004 with liver biopsy indicating stage III fibrosis.  Her most recent liver biopsy was done at [facility] in March 2015 which revealed cirrhosis and NASH.  Most recent A1c reported to be 5.6.  Overall for liver subjective she was doing well without any confusion, jaundice, abdominal pain, or ascites.  She had a history of cellulitis in July 2022 and continued to have some lower extremity edema and slight redness.  Not taking any diuretics.  Taking ursodiol for pruritus with good relief. Planned for EGD and colonoscopy for possible  reported intermittent dark stools. Needed cardiology clearance . Has recent stable ferritin and iron panel.  [place] screening ordered, advised to continue ursodiol.  Platelet count reported as normal.  Last visit with transplant 07/07/2022.  Presented with hypotension.  Noted to be admitted locally in April 2023 for concerns of sepsis secondary to infected urethral stone and was treated with antibiotics.  Denied confusion, jaundice, abdominal pain, or ascites.  Remains on ursodiol for pruritus.  Was to be scheduled for Watchman procedure and September 2023 with plans to stop anticoagulation shortly after.  Normal iron and ferritin in July 2023.  Scheduled for iron infusion in August.  Hepatoma screening up-to-date.  Recent EGD 03/11/22 at [facility]: -Normal esophagus, no varices present -Portal hypertensive gastropathy -Normal duodenum -Advise repeat EGD in 3 years for variceal screening.  Recent colonoscopy 03/11/22 at [facility]: -Entire examined colon normal -Prominent fold in the ascending colon (appears to be an underlying lipoma) -Hemorrhoids on perianal exam -No further colonoscopy recommended given age and comorbidities  Had a fall in August 2023 with L2 compression fracture.  Evidence of closed displaced fracture of 6th cervical vertebrae and fourth cervical vertebrae with delayed healing.  She also had acute metabolic encephalopathy but mental status cleared shortly after receiving IV fluids.  Confusion felt to be likely due to dehydration as she had negative urine and blood cultures.  She was advised to stop taking her Eliquis at this time.  Patient reports she was just discharged this past Monday and then only been home for about 2-1/2 days.  During this time at home she reports that she did not have  any bowel movements and then began to have 2 smaller loose stools since admission.  She denies any abdominal pain, nausea, vomiting, fevers, confusion, jaundice, melena, BRBPR, or pruritus.  She reports a  baseline lack of appetite.  Has been drinking at least 1 Ensure per day.  She used to have a history of pruritus for which she used to take ursodiol which she stopped about 5 months ago.  She states she has has chronic bruising to her upper and lower extremities, some of the worsening discolorations are from injuries from scratches from her dog.  She states her normal blood pressure is 110/51.  She began to notice swelling in her legs, feet, and arms yesterday without any shortness of breath.  Denies any frequent water intake, primarily drinks Sprite throughout the day.  States she follows with [facility] for her cirrhosis and is due to see them again in January.  We will also be receiving her CT of liver for hepatoma screening.    Past Medical History:  Diagnosis Date   Acute kidney injury superimposed on chronic kidney disease ([place]) 10/23/2022   Age related osteoporosis    Anemia due to blood loss    iron infusion's last one 03-25-2021 (pt bleeding post vulvar bx 02-11-2021, hospiatal admission 02-21-2021)   Anticoagulant long-term use    eliquis--- managed by pcp    Arthritis    hands   Bronchiectasis [facility])    pulmonology --- dr Halford Chessman   (04-01-2021  pt stated no supplemental oxygen use since 01/ 2022 and no inhaler use,  stated checks O2 at home during the day 98-99%)   Cancer ([place]) 2013   Lumpectomy left breast   Cataract 2015   Chronic constipation    Clotting disorder ([place])    On eliquis   Coronary artery calcification    per lexiscan 08-17-2019 result in care everywhere   Depression, major, single episode, moderate ([place]) 12/05/2021   DOE (dyspnea on exertion)    04-01-2021  per pt only going up stairs    Gastroesophageal reflux disease 12/05/2021   History of diabetes mellitus, type II    04-01-2021  per pt no issues after weight loss, last taken medication approx 2017   History of Graves' disease 2017   s/p RAI   History of left breast cancer    dx 2013,  s/p left partial mastecotmy  w/ node dissection 09-15-2012, low grade DCIS, no chemo, completed radiation 02/ 2014,  per pt no recurrence   History of respiratory syncytial virus (RSV) infection 09/2020   w/ hypoxia  , hospital admission 10/ 2021  and acute exacerbation bronchiectasis;  follow up post hospital pulmonogy dr Halford Chessman  note in epic   Hyperlipidemia    Hypertension    followed by pcp   (pt had lexiscan in care everywhere dated 08-17-2019 no ischemia , non-obstructive extensive calcification, ef 69%)   Hypothyroidism    Hypothyroidism, postradioiodine therapy    followed by pcp   Liver cirrhosis secondary to NASH (nonalcoholic steatohepatitis) ([place])    followed by dr Merrilee Jansky ([facility])---- dx 2004,  compensated ,  last liver bx (03/ 2015) fibrosis stage 3;  moderate portal hypertensive gastrophy   PAF (paroxysmal atrial fibrillation) [facility]) previous cardiologist lov note w/ Suann Larry PA ([facility] in [place]) dated 01-24-2020 in care everywhere;  (04-01-2021 per pt has appt w/ new cardiologist , dr j. branch 04-30-2021)   first dx 2020--  had event monitor/ stress test/ echo  all results in care everywhere (monitor 08-03-2019 short runs AFib conversion pause <2 secconds, rate controlled;  echo 12-14-2019 ef 60%, mild concentric LVH, G2DD, RSVP 50.64mHg)   Personal history of radiation therapy 2013   left breast cancer,  completed 02/ 2014   PONV (postoperative nausea and vomiting)    Sarcoidosis, lung ([facility] pulmonology--- dr sHalford Chessman  04-01-2021  per pt dx age 4964s, no issue since   Sepsis secondary to UTI ([place] 03/27/2022   Thrombocytopenia ([facility]    hematology/ oncology--- dr s. kDelton Coombes  VIN III (vulvar intraepithelial neoplasia III)     Past Surgical History:  Procedure Laterality Date   ATyrone  BREAST SURGERY  09/15/2012   CATARACT EXTRACTION W/ INTRAOCULAR LENS  IMPLANT, BILATERAL  2016    CHOLECYSTECTOMY OPEN  19935  CO2 LASER APPLICATION N/A 070/17/7939  Procedure: CO2 LASER APPLICATION OF THE VULVA;  Surgeon: REveritt Amber MD;  [place] [facility]  Service: Gynecology;  Laterality: N/A;   COLONOSCOPY WITH ESOPHAGOGASTRODUODENOSCOPY (EGD)  last one 08/ 2018  @[facility]    EYE SURGERY  2015   Cataract removed both eyes   LUMBAR LAMINECTOMY  2009   PARTIAL MASTECTOMY WITH AXILLARY SENTINEL LYMPH NODE BIOPSY [facility]  TUBAL LIGATION  08/08/1969    Prior to Admission medications   Medication Sig Start Date End Date Taking? Authorizing Provider  acidophilus (RISAQUAD) CAPS capsule Take 2 capsules by mouth 3 (three) times daily for 10 days. 10/20/22 10/30/22 Yes Shahmehdi, SValeria Batman MD  apixaban (ELIQUIS) 2.5 MG TABS tablet Take 1 tablet (2.5 mg total) by mouth 2 (two) times daily. 10/20/22  Yes Shahmehdi, Seyed A, MD  atorvastatin (LIPITOR) 20 MG tablet TAKE (1) TABLET BY MOUTH ONCE DAILY. Patient taking differently: Take 20 mg by mouth daily. 03/05/22  Yes PLindell Spar MD  Cholecalciferol 50 MCG (2000 UT) CAPS Take 1 capsule (2,000 Units total) by mouth daily. 03/30/22  Yes Kc, RMaren Beach MD  diltiazem (CARDIZEM CD) 120 MG 24 hr capsule TAKE (1) CAPSULE BY MOUTH ONCE DAILY. Patient taking differently: Take 120 mg by mouth daily. 05/16/22  Yes Branch,Alphonse Guild MD  feeding supplement (ENSURE ENLIVE / ENSURE PLUS) LIQD Take 237 mLs by mouth 2 (two) times daily between meals. 07/28/22  Yes SManuella Ghazi Pratik D, DO  fidaxomicin (DIFICID) 200 MG TABS tablet Take 1 tablet (200 mg total) by mouth 2 (two) times daily for 10 days 10/16/22  Yes Shahmehdi, SValeria Batman MD  levothyroxine (SYNTHROID) 100 MCG tablet Take 1 tablet (100 mcg total) by mouth daily before breakfast. 06/09/22  Yes PPosey Pronto RColin Broach MD  lisinopril-hydrochlorothiazide (ZESTORETIC) 20-25 MG tablet TAKE (1) TABLET BY MOUTH EACH MORNING. Patient taking differently: Take 1 tablet by mouth daily. 10/21/22  Yes  PLindell Spar MD  midodrine (PROAMATINE) 5 MG tablet Take 1 tablet (5 mg total) by mouth 3 (three) times daily with meals for 10 days. 10/20/22 10/30/22 Yes Shahmehdi, SValeria Batman MD  nitroGLYCERIN (NITROSTAT) 0.4 MG SL tablet Place 1 tablet (0.4 mg total) under the tongue every 5 (five) minutes as needed for chest pain. 07/31/21  Yes PLindell Spar MD  Nystatin (GERHARDT'S BUTT CREAM) CREA Apply 1 Application topically 3 (three) times daily. 10/20/22 11/19/22 Yes Shahmehdi, Seyed A, MD  ondansetron (ZOFRAN-ODT) 4 MG disintegrating tablet Take 1 tablet (4 mg total) by mouth every  8 (eight) hours as needed for nausea or vomiting. 10/20/22  Yes Shahmehdi, Seyed A, MD  tolterodine (DETROL LA) 4 MG 24 hr capsule Take 1 capsule (4 mg total) by mouth daily. 08/28/22  Yes McKenzie, Candee Furbish, MD  vitamin B-12 (CYANOCOBALAMIN) 500 MCG tablet Take 500 mcg by mouth daily.   Yes [provider]  fiber supplement, BANATROL TF, liquid Place 60 mLs into feeding tube 2 (two) times daily for 10 days. Patient not taking: Reported on 10/23/2022 10/20/22 10/30/22  Deatra James, MD  Multiple Vitamin (MULTIVITAMIN WITH MINERALS) TABS tablet Take 1 tablet by mouth daily. Patient not taking: Reported on 10/23/2022 10/21/22 11/20/22  Deatra James, MD    Current Facility-Administered Medications  Medication Dose Route Frequency Provider Last Rate Last Admin   acetaminophen (TYLENOL) tablet 650 mg  650 mg Oral Q6H PRN Emokpae, Ejiroghene E, MD       Or   acetaminophen (TYLENOL) suppository 650 mg  650 mg Rectal Q6H PRN Emokpae, Ejiroghene E, MD       atorvastatin (LIPITOR) tablet 20 mg  20 mg Oral Daily Emokpae, Ejiroghene E, MD   20 mg at 10/24/22 9509   Chlorhexidine Gluconate Cloth 2 % PADS 6 each  6 each Topical Daily Emokpae, Ejiroghene E, MD   6 each at 10/24/22 3267   feeding supplement (ENSURE ENLIVE / ENSURE PLUS) liquid 237 mL  237 mL Oral BID BM Emokpae, Ejiroghene E, MD   237 mL at  10/24/22 1245   levothyroxine (SYNTHROID) tablet 100 mcg  100 mcg Oral Q0600 Emokpae, Ejiroghene E, MD   100 mcg at 10/24/22 0629   midodrine (PROAMATINE) tablet 5 mg  5 mg Oral TID WC Emokpae, Ejiroghene E, MD   5 mg at 10/24/22 8099   mupirocin ointment (BACTROBAN) 2 % 1 Application  1 Application Nasal BID Emokpae, Ejiroghene E, MD   1 Application at 83/38/25 0813   norepinephrine (LEVOPHED) 51m in 2540m(0.016 mg/mL) premix infusion  2-10 mcg/min Intravenous Titrated Emokpae, Ejiroghene E, MD 7.5 mL/hr at 10/24/22 0718 2 mcg/min at 10/24/22 0718   ondansetron (ZOFRAN) tablet 4 mg  4 mg Oral Q6H PRN Emokpae, Ejiroghene E, MD       Or   ondansetron (ZOFRAN) injection 4 mg  4 mg Intravenous Q6H PRN Emokpae, Ejiroghene E, MD       polyethylene glycol (MIRALAX / GLYCOLAX) packet 17 g  17 g Oral Daily PRN Emokpae, Ejiroghene E, MD        Allergies as of 10/23/2022 - Review Complete 10/23/2022  Allergen Reaction Noted   Dicyclomine Other (See Comments) 08/03/2019   Glucophage [metformin] Diarrhea 04/01/2021   Losartan Other (See Comments) 03/16/2019    Family History  Problem Relation Age of Onset   Hypertension Mother    Dementia Mother    Vision loss Mother    Heart disease Father    Hearing loss Father    Stroke Maternal Grandfather    Heart disease Brother     Social History   Socioeconomic History   Marital status: Widowed    Spouse name: Not on file   Number of children: 3   Years of education: Not on file   Highest education level: Not on file  Occupational History   Occupation: retired  Tobacco Use   Smoking status: Never   Smokeless tobacco: Never  Vaping Use   Vaping Use: Never used  Substance and Sexual Activity   Alcohol use: Not Currently  Drug use: Never   Sexual activity: Not Currently    Birth control/protection: Post-menopausal  Other Topics Concern   Not on file  Social History Narrative   Not on file   Social Determinants of Health   Financial  Resource Strain: Low Risk  (08/18/2022)   Overall Financial Resource Strain (CARDIA)    Difficulty of Paying Living Expenses: Not hard at all  Food Insecurity: No Food Insecurity (10/23/2022)   Hunger Vital Sign    Worried About Running Out of Food in the Last Year: Never true    Ran Out of Food in the Last Year: Never true  Transportation Needs: No Transportation Needs (10/23/2022)   PRAPARE - Hydrologist (Medical): No    Lack of Transportation (Non-Medical): No  Physical Activity: Insufficiently Active (08/18/2022)   Exercise Vital Sign    Days of Exercise per Week: 2 days    Minutes of Exercise per Session: 30 min  Stress: No Stress Concern Present (08/18/2022)   [place]    Feeling of Stress : Not at all  Social Connections: Moderately Isolated (08/18/2022)   Social Connection and Isolation Panel [NHANES]    Frequency of Communication with Friends and Family: More than three times a week    Frequency of Social Gatherings with Friends and Family: More than three times a week    Attends Religious Services: More than 4 times per year    Active Member of [employer] or Organizations: No    Attends Archivist Meetings: Never    Marital Status: Widowed  Intimate Partner Violence: Not At Risk (10/23/2022)   Humiliation, Afraid, Rape, and Kick questionnaire    Fear of Current or Ex-Partner: No    Emotionally Abused: No    Physically Abused: No    Sexually Abused: No     Review of Systems   Gen: Denies any fever, chills, loss of appetite, change in weight or weight loss CV: Denies chest pain, heart palpitations, syncope, edema  Resp: Denies shortness of breath with rest, cough, wheezing, coughing up blood, and pleurisy. GI: See HPI GU : Denies urinary burning, blood in urine, urinary frequency, and urinary incontinence. MS: Denies joint pain, limitation of movement, swelling, cramps,  and atrophy.  Derm: Denies rash, itching, dry skin, hives. Psych: Denies depression, anxiety, memory loss, hallucinations, and confusion. Heme: Denies bruising or bleeding Neuro:  Denies any headaches, dizziness, paresthesias, shaking  Physical Exam   Vital Signs in last 24 hours: Temp:  [97.4 F (36.3 C)-98.3 F (36.8 C)] 98 F (36.7 C) (11/17 0800) Pulse Rate:  [53-79] 65 (11/17 0845) Resp:  [10-22] 16 (11/17 0845) BP: (77-159)/(30-104) 107/44 (11/17 0845) SpO2:  [93 %-100 %] 100 % (11/17 0845) FiO2 (%):  [21 %] 21 % (11/16 2230) Weight:  [42.6 kg-62.5 kg] 62.5 kg (11/17 0410) Last BM Date : 10/24/22  General:   Alert, pleasant and cooperative in NAD Head:  Normocephalic and atraumatic. Eyes:  Sclera clear, no icterus.   Conjunctiva pink. Ears:  Normal auditory acuity. Mouth:  No deformity or lesions, dentition normal. Abdomen:  Soft, nontender and nondistended. No masses, hepatosplenomegaly or hernias noted. Normal bowel sounds, without guarding, and without rebound.   Rectal: deferred   Msk:  Symmetrical without gross deformities. Normal posture. Extremities:  + 2 edema to BLE, +1 to BUE Neurologic:  Alert and  oriented x4. Skin: Significant bruising to bilateral lower extremities and right  upper extremity.  Very mild erythema. Psych:  Alert and cooperative. Normal mood and affect.  Intake/Output from previous day: 11/16 0701 - 11/17 0700 In: 1752.5 [P.O.:1250; I.V.:80.5; Blood:422] Out: 200 [Urine:200] Intake/Output this shift: Total I/O In: 32.3 [I.V.:32.3] Out: -   Labs/Studies   Recent Labs Recent Labs    10/23/22 1430 10/23/22 2249 10/24/22 0236  WBC 5.5  --  5.5  HGB 8.8* 11.1* 10.1*  HCT 26.6* 33.8* 29.6*  PLT 150  --  114*   BMET Recent Labs    10/23/22 1430 10/24/22 0236  NA 137 136  K 3.5 4.2  CL 111 112*  CO2 18* 18*  GLUCOSE 98 88  BUN 20 20  CREATININE 2.55* 2.34*  CALCIUM 8.3* 8.0*   LFT Recent Labs    10/23/22 1430  PROT  4.7*  ALBUMIN 2.9*  AST 33  ALT 18  ALKPHOS 71  BILITOT 1.2   PT/INR No results for input(s): "LABPROT", "INR" in the last 72 hours. Hepatitis Panel No results for input(s): "HEPBSAG", "HCVAB", "HEPAIGM", "HEPBIGM" in the last 72 hours. C-Diff No results for input(s): "CDIFFTOX" in the last 72 hours.  Radiology/Studies CT ABDOMEN PELVIS WO CONTRAST  Result Date: 10/24/2022 CLINICAL DATA:  Acute anemia, hypertension, chronic anticoagulation EXAM: CT ABDOMEN AND PELVIS WITHOUT CONTRAST TECHNIQUE: Multidetector CT imaging of the abdomen and pelvis was performed following the standard protocol without IV contrast. RADIATION DOSE REDUCTION: This exam was performed according to the departmental dose-optimization program which includes automated exposure control, adjustment of the mA and/or kV according to patient size and/or use of iterative reconstruction technique. COMPARISON:  10/05/2022 FINDINGS: Lower chest: Moderate left pleural effusion with compressive atelectasis of the left lung base. Extensive multi-vessel coronary artery calcification. Global cardiac size within normal limits. Hepatobiliary: The liver is shrunken with hypertrophy of the caudate lobe, subtle contour nodularity, and heterogeneity of the parenchyma which may relate to changes of underlying cirrhosis. Superimposed hepatic steatosis. Cholecystectomy has been performed. Mild extrahepatic biliary ductal dilation may represent post cholecystectomy change. Pancreas: Atrophic, but otherwise unremarkable. Spleen: Unremarkable Adrenals/Urinary Tract: The adrenal glands are unremarkable. The kidneys are normal in position. Mild left renal cortical atrophy. Right kidney is normal in size. No hydronephrosis. 2 mm nonobstructing calculi scattered within the left kidney. 2 mm nonobstructing calculus noted within the distal left ureter at the ureterovesicular junction. No hydronephrosis. Bladder is unremarkable. Stomach/Bowel: Stomach, small  bowel, and large bowel are unremarkable. Appendix absent. No free intraperitoneal gas. Mild ascites. Vascular/Lymphatic: Moderate aortoiliac atherosclerotic calcification. No aortic aneurysm. No pathologic adenopathy within the abdomen and pelvis. Reproductive: Status post hysterectomy. No adnexal masses. Other: Moderate diffuse subcutaneous body wall edema. No abdominal wall hernia. No retroperitoneal hematoma identified. Musculoskeletal: Osseous structures are diffusely osteopenic. Remote compression deformity of L2 noted with approximately 50% loss of height. No acute bone abnormality. IMPRESSION: 1. No acute intra-abdominal pathology identified. No definite radiographic explanation for the patient's reported symptoms. 2. Extensive multi-vessel coronary artery calcification. 3. Morphologic changes suspicious for underlying cirrhosis. Tissue sampling or hepatic elastography may be helpful for further evaluation. Superimposed hepatic steatosis. 4. Mild left nonobstructing nephrolithiasis. 2 mm nonobstructing calculus within the distal left ureter at the ureterovesicular junction. 5. Moderate left pleural effusion, mild ascites, and moderate diffuse subcutaneous body wall edema in keeping with anasarca. 6. Osteopenia with remote compression deformity of L2. 7.  Aortic Atherosclerosis (ICD10-I70.0). Electronically Signed   By: Fidela Salisbury M.D.   On: 10/24/2022 03:25   DG Chest Port 1  View  Result Date: 10/23/2022 CLINICAL DATA:  Shortness of breath, BILATERAL leg swelling, recent diagnosis of CHF EXAM: PORTABLE CHEST 1 VIEW COMPARISON:  Portable exam 1441 hours compared to 10/16/2022 FINDINGS: Upper normal heart size. Mediastinal contours and pulmonary vascularity normal. 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 Mild LEFT basilar atelectasis. No pulmonary infiltrate, pleural effusion, or pneumothorax. Bones demineralized. IMPRESSION: COPD changes with LEFT basilar atelectasis. Emphysema  (ICD10-J43.9). Electronically Signed   By: Lavonia Dana M.D.   On: 10/23/2022 14:48     Assessment   Darah Simkin is a 81 y.o. 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  She presented to the ED with complaint of bilateral upper and lower extremity swelling as well as significant bruising and mild abdominal bloating.  GI consulted given concern for possible GI bleed and cirrhosis recommendations.  NASH Cirrhosis: Diagnosed with stage III fibrosis in 2004 via liver biopsy and had repeat liver biopsy in March 2015 at [facility] which revealed cirrhosis and NASH.  Had been followed by [facility] previously until her physician moved to [facility] and now she receives her care there.  Had follow-up in July 2023 with them and doing very well.  Recent iron and ferritin normal in July.  Previously on ursodiol for pruritus which has been discontinued and is without any current symptoms off medication.  Patient hypotensive on presentation and received liter of IV supplementation as well as 2 units of PRBC for hemoglobin of 8.8.  Her albumin is low at 2.9 and given elevated lactic acid and subsequent drop in BP she was started on Levophed.  Creatinine slightly elevated above baseline at 2.55.  Normal LFTs.  INR 2 today. MELD Na: 24 (likely given elevated INR and creatinine).  No evidence of hepatic encephalopathy, jaundice, abdominal pain on exam.  She does have +2 pitting edema to bilateral lower extremities and +1 edema to bilateral upper extremities.  She would likely benefit from IV albumin and possibly low-dose diuretics for management of edema once blood pressure under control and she is off pressor.  Continue to hold antihypertensives until resolved.  AKI: Cr. 2.55 on admission, baseline appears to be  1.2-1.6. Likely secondary to dehydration and hypotension.  Would likely benefit from some IV albumin prior to discharge given hypoalbunemia. Query source of hypotension.   Anemia: Presented with Hgb 8.8 without any reports of hematemesis, melena, BRBPR. Stool was heme negative but given hypotension she was transfused 2u PRBC. Hgb 10.1 today. Two weeks ago she was 10.9. Anemia likely multifactorial however less likely GI bleed. EGD and TCS earlier this year without varices and completely normal colonoscopy.   Plan / Recommendations   Continue to monitor H/H, transfuse for hemoglobin less than 7. Continue to monitor daily MELD labs: CBC, CMP, INR Monitor for recurrent bleeding with restarting anticoagulation Continue to hold antihypertensives Check INR today Would likely benefit from a dose of IV albumin 50g May benefit from low dose diuretic after BP controlled/stable Follow up with [facility] transplant team post hospitalization regarding anemia and liver disease     10/24/2022, 9:07 AM  Venetia Night, MSN, FNP-BC, AGACNP-BC [facility]   Attending note: Patient seen and examined.  Agree with [facility] consultation note as outlined.  No evidence of GI bleeding.  Agree with considering other causes of hypotension  in the setting of multiple comorbidities and multiple antihypertensives on board.  Watch for fluid overload.  She is susceptible.  Exercise a restrictive transfusion strategy with no blood transfusion for hemoglobin  greater than 7 in the absence of hemodynamically significant bleeding (hemorrhagic shock).  I reviewed GI issues personally with patient's daughter Duaine Dredge via telephone [phone])

## 2022-10-24 NOTE — Progress Notes (Signed)
*  PRELIMINARY RESULTS* Echocardiogram 2D Echocardiogram has been performed.  Tasha Roberts 10/24/2022, 3:09 PM

## 2022-10-24 NOTE — Plan of Care (Signed)

## 2022-10-24 NOTE — Care Management Important Message (Signed)
Important Message  Patient Details  Name: Tasha Roberts MRN: [id number] Date of Birth: [birth year]   Medicare Important Message Given:  Yes     Tommy Medal 10/24/2022, 3:37 PM

## 2022-10-24 NOTE — Telephone Encounter (Signed)
Patient is currently admitted, defer evaluation to hospitilst team  Zandra Abts MD

## 2022-10-24 NOTE — Progress Notes (Addendum)
PROGRESS NOTE     Coree Riester, is a 81 y.o. female, DOB - Feb 07, 1941, FBP:[id number]  Admit date - 10/23/2022   Admitting Physician Bethena Roys, MD  Outpatient Primary MD for the patient is Lindell Spar, MD  LOS - 1  Chief Complaint  Patient presents with   Shortness of Breath        Brief Narrative:  81 y.o. 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  For Levophed -Hold Cardizem, lisinopril HCTZ -Continue midodrine 5 mg 3 times daily  2)Acute anemia Hemoglobin 8.8 on admission -Hgb was 12.3 on 09/29/2022,  No melena, no hematochezia or hematemesis.  Hypotensive.  Stool occult negative.  -Patient did receive 2 units of PRBC on 10/23/2022 and Hgb is now up to 10.1 from 8.8 -CT abdomen and pelvis without acute findings specifically no evidence of intra-abdominal or pelvic area bleeding -GI consult appreciated -EGD and TCS earlier this year without varices and completely normal colonoscopy.   -Hold Eliquis   3)Extremity edema/anasarca Reports edema to upper and lower bilateral lower extremities - Likely related to hypoalbuminemia 2.9, NASH liver cirrhosis.   - Hold off on diuresis for now with initial hypotension -Echo with EF of 60 to 82%, diastolic parameters are indeterminate, no aortic stenosis no mitral stenosis -May benefit from IV albumin prior to discharge  4)PAFib  - Rate controlled and on anticoagulation with Eliquis. -Hold Eliquis for now with acute anemia and hypotension -Hold Cardizem, consider using metoprolol instead of Cardizem given underlying liver cirrhosis with portal hypertension concerns  5)Acute kidney injury superimposed on chronic kidney disease ([place]) AKI on CKD 4.  Creatinine 2.5.  Baseline about 1.8.  Likely prerenal from hypotension. -1 unit transfused, 1 unit of blood given. -Hold lisinopril- HCTZ  6)DM II (diabetes mellitus, type II), controlled (HCC) - HgbA1c- 4.3 -Diet controlled  7) C. difficile colitis--- diarrhea persist despite treatment with Dificid -Retest for C. Difficile -May need longer duration of Dificid with taper  8)hypothyroidism--- continue levothyroxine  9)NASH Liver cirrhosis----patient follows with [facility] transplant services  Disposition/Need for in-Hospital Stay- patient unable to be discharged at this time due to hypotension and concerns about hemodynamic instability requiring transfusion and infusion of IV albumin*  Status is: Inpatient   Disposition: The patient is from: Home              Anticipated d/c is to: Home              Anticipated d/c date is: 1 day              Patient currently is not medically stable to d/c. Barriers: Not Clinically Stable-   Code Status :  -  Code Status: Full Code   Family Communication:    NA (patient is alert, awake and coherent)   DVT Prophylaxis  :   - SCDs  SCDs Start: 10/23/22 2230   Lab Results  Component Value Date   PLT  114 (L) 10/24/2022    Inpatient Medications  Scheduled Meds:  atorvastatin  20 mg Oral Daily   Chlorhexidine Gluconate Cloth  6 each Topical Daily   feeding supplement  237 mL Oral BID BM   fidaxomicin  200 mg Oral BID   levothyroxine  100 mcg Oral Q0600   midodrine  5 mg Oral TID WC   mupirocin ointment  1 Application Nasal BID   Continuous Infusions:  norepinephrine (LEVOPHED) Adult infusion 2 mcg/min (10/24/22  1631)   PRN Meds:.acetaminophen **OR** acetaminophen, ondansetron **OR** ondansetron (ZOFRAN) IV, polyethylene glycol   Anti-infectives (From admission, onward)    Start     Dose/Rate Route Frequency Ordered Stop   10/24/22 1130  fidaxomicin (DIFICID) tablet 200 mg        200 mg Oral 2 times daily 10/24/22 1034 10/27/22 2159       Subjective: [name] today has no fevers, no emesis,  No chest pain,   - Diarrhea persist -Fatigue persist  Objective: Vitals:   10/24/22 1745 10/24/22 1800 10/24/22 1815 10/24/22 1830  BP: (!) 128/49 108/60 (!) 116/41 (!) 124/110  Pulse: 84 85 79 97  Resp: 15 17 19  (!) 26  Temp:      TempSrc:      SpO2: 100% 100% 99% 95%  Weight:      Height:        Intake/Output Summary (Last 24 hours) at 10/24/2022 1920 Last data filed at 10/24/2022 1633 Gross per 24 hour  Intake 1966.62 ml  Output 500 ml  Net 1466.62 ml   Filed Weights   10/23/22 1414 10/23/22 2200 10/24/22 0410  Weight: 42.6 kg 61.3 kg 62.5 kg    Physical Exam  Gen:- Awake Alert,  in no apparent distress  HEENT:- [place].AT, No sclera icterus Neck-Supple Neck,No JVD,.  Lungs-  CTAB , fair symmetrical air movement CV- S1, S2 normal, irregular  Abd-  +ve B.Sounds, Abd Soft, No tenderness,    Extremity/Skin:- pedal pulses present , significant ecchymosis especially on the right upper extremities, significant edema of both lower extremities on both upper extremities especially right upper extremity Psych-affect is appropriate, oriented x3 Neuro-generalized weakness, no new focal deficits, no tremors  Data Reviewed: I have personally reviewed following labs and imaging studies  CBC: Recent Labs  Lab 10/23/22 1430 10/23/22 2249 10/24/22 0236  WBC 5.5  --  5.5  NEUTROABS 3.7  --   --   HGB 8.8* 11.1* 10.1*  HCT 26.6* 33.8* 29.6*  MCV 91.1  --  86.0  PLT 150  --  096*   Basic Metabolic Panel: Recent Labs  Lab 10/19/22 0745 10/20/22 0506 10/23/22 1430 10/23/22 1807  10/24/22 0236  NA 136 136 137  --  136  K 4.0 3.9 3.5  --  4.2  CL 111 113* 111  --  112*  CO2 22 20* 18*  --  18*  GLUCOSE 80 79 98  --  88  BUN 18 18 20   --  20  CREATININE 1.81* 1.89* 2.55*  --  2.34*  CALCIUM 7.4* 7.5* 8.3*  --  8.0*  MG  --   --   --  1.8  --    GFR: Estimated Creatinine Clearance: 16.1 mL/min (A) (by C-G formula based on SCr of 2.34 mg/dL (H)). Liver Function Tests: Recent Labs  Lab 10/19/22 0745 10/20/22 0506 10/23/22 1430  AST 25 24 33  ALT 12 12 18   ALKPHOS 63 57 71  BILITOT 0.7 0.8  1.2  PROT 3.7* 3.9* 4.7*  ALBUMIN 2.1* 2.5* 2.9*   Cardiac Enzymes: No results for input(s): "CKTOTAL", "CKMB", "CKMBINDEX", "TROPONINI" in the last 168 hours. BNP (last 3 results) No results for input(s): "PROBNP" in the last 8760 hours. HbA1C: No results for input(s): "HGBA1C" in the last 72 hours. Sepsis Labs: @LABRCNTIP (procalcitonin:4,lacticidven:4) ) Recent Results (from the past 240 hour(s))  C Difficile Quick Screen w PCR reflex     Status: Abnormal   Collection Time: 10/15/22  6:15 PM   Specimen: STOOL  Result Value Ref Range Status   C Diff antigen POSITIVE (A) NEGATIVE Final   C Diff toxin NEGATIVE NEGATIVE Final   C Diff interpretation Results are indeterminate. See PCR results.  Final    Comment: Performed at [facility], [address] [place], [place] [address]  Gastrointestinal Panel by PCR , Stool     Status: None   Collection Time: 10/15/22  6:15 PM   Specimen: STOOL  Result Value Ref Range Status   Campylobacter species NOT DETECTED NOT DETECTED Final   Plesimonas shigelloides NOT DETECTED NOT DETECTED Final   Salmonella species NOT DETECTED NOT DETECTED Final   Yersinia enterocolitica NOT DETECTED NOT DETECTED Final   Vibrio species NOT DETECTED NOT DETECTED Final   Vibrio cholerae NOT DETECTED NOT DETECTED Final   Enteroaggregative E coli (EAEC) NOT DETECTED NOT DETECTED Final   Enteropathogenic E coli (EPEC) NOT DETECTED NOT DETECTED  Final   Enterotoxigenic E coli (ETEC) NOT DETECTED NOT DETECTED Final   Shiga like toxin producing E coli (STEC) NOT DETECTED NOT DETECTED Final   Shigella/Enteroinvasive E coli (EIEC) NOT DETECTED NOT DETECTED Final   Cryptosporidium NOT DETECTED NOT DETECTED Final   Cyclospora cayetanensis NOT DETECTED NOT DETECTED Final   Entamoeba histolytica NOT DETECTED NOT DETECTED Final   Giardia lamblia NOT DETECTED NOT DETECTED Final   Adenovirus F40/41 NOT DETECTED NOT DETECTED Final   Astrovirus NOT DETECTED NOT DETECTED Final   Norovirus GI/GII NOT DETECTED NOT DETECTED Final   Rotavirus A NOT DETECTED NOT DETECTED Final   Sapovirus (I, II, IV, and V) NOT DETECTED NOT DETECTED Final    Comment: Performed at [facility], [address] [place], [place] [address]  C. Diff by PCR, Reflexed     Status: Abnormal   Collection Time: 10/15/22  6:15 PM  Result Value Ref Range Status   Toxigenic C. Difficile by PCR POSITIVE (A) NEGATIVE Final    Comment: Positive for toxigenic C. difficile with little to no toxin production. Only treat if clinical presentation suggests symptomatic illness. Performed at [facility], [place] [address], [place], [place] [address]   MRSA Next Gen by PCR, Nasal     Status: Abnormal   Collection Time: 10/23/22  7:43 PM   Specimen: Nasal Mucosa; Nasal Swab  Result Value Ref Range Status   MRSA by PCR Next Gen DETECTED (A) NOT DETECTED Final    Comment: RESULT CALLED TO, READ BACK BY AND VERIFIED WITH: MAYNARD,R@2157  BY MATTHEWS,B 11.16.2023 (NOTE) The GeneXpert MRSA Assay (FDA approved for NASAL specimens only), is one component of a comprehensive MRSA colonization surveillance program. It is not intended to diagnose MRSA infection nor to guide or monitor treatment for MRSA infections. Test performance is not FDA approved in patients less than 51 years old. Performed at [facility], [address] [place], [place] [address]       Radiology  Studies: ECHOCARDIOGRAM COMPLETE  Result Date: 10/24/2022  ECHOCARDIOGRAM REPORT   Patient Name:   FADIA Jesus Date of Exam: 10/24/2022 Medical Rec #:  [id number]   Height:       63.0 in Accession #:    [id number]  Weight:       137.8 lb Date of Birth:  1941/01/10    BSA:          1.650 m Patient Age:    33 years    BP:           103/36 mmHg Patient Gender: F           HR:           75 bpm. Exam Location:  [facility] Procedure: 2D Echo, Cardiac Doppler and Color Doppler Indications:    Bilateral lower extremity edema  History:        Patient has no prior history of Echocardiogram examinations.                 CAD, Arrythmias:Atrial Fibrillation; Risk Factors:Hypertension,                 Diabetes and Dyslipidemia. DCIS (ductal carcinoma in situ) of                 breast.  Sonographer:    Alvino Chapel RCS Referring Phys: [phone] [place]  1. Left ventricular ejection fraction, by estimation, is 60 to 65%. The left ventricle has normal function. The left ventricle has no regional wall motion abnormalities. Left ventricular diastolic parameters are indeterminate.  2. Right ventricular systolic function is normal. The right ventricular size is normal.  3. The mitral valve is normal in structure. No evidence of mitral valve regurgitation. No evidence of mitral stenosis.  4. The aortic valve is tricuspid. There is mild calcification of the aortic valve. There is mild thickening of the aortic valve. Aortic valve regurgitation is not visualized. No aortic stenosis is present.  5. The inferior vena cava is normal in size with greater than 50% respiratory variability, suggesting right atrial pressure of 3 mmHg. FINDINGS  Left Ventricle: Left ventricular ejection fraction, by estimation, is 60 to 65%. The left ventricle has normal function. The left ventricle has no regional wall motion abnormalities. The left ventricular internal cavity size was normal in size. There is  no left ventricular  hypertrophy. Left ventricular diastolic parameters are indeterminate. Right Ventricle: The right ventricular size is normal. Right vetricular wall thickness was not well visualized. Right ventricular systolic function is normal. Left Atrium: Left atrial size was normal in size. Right Atrium: Right atrial size was normal in size. Pericardium: There is no evidence of pericardial effusion. Mitral Valve: The mitral valve is normal in structure. There is mild thickening of the mitral valve leaflet(s). There is mild calcification of the mitral valve leaflet(s). Mild mitral annular calcification. No evidence of mitral valve regurgitation. No evidence of mitral valve stenosis. Tricuspid Valve: The tricuspid valve is not well visualized. Tricuspid valve regurgitation is not demonstrated. No evidence of tricuspid stenosis. Aortic Valve: The aortic valve is tricuspid. There is mild calcification of the aortic valve. There is mild thickening of the aortic valve. There is mild aortic valve annular calcification. Aortic valve regurgitation is not visualized. No aortic stenosis  is present. Aortic valve mean gradient measures 2.7 mmHg. Aortic valve peak gradient measures 6.7 mmHg. Aortic valve area, by VTI measures 2.90 cm. Pulmonic Valve: The pulmonic valve was not well visualized. Pulmonic valve regurgitation is not visualized. No evidence  of pulmonic stenosis. Aorta: The aortic root is normal in size and structure. Venous: The inferior vena cava is normal in size with greater than 50% respiratory variability, suggesting right atrial pressure of 3 mmHg. IAS/Shunts: No atrial level shunt detected by color flow Doppler.  LEFT VENTRICLE PLAX 2D LVIDd:         5.10 cm   Diastology LVIDs:         2.90 cm   LV e' medial:    6.42 cm/s LV PW:         1.00 cm   LV E/e' medial:  11.6 LV IVS:        0.90 cm   LV e' lateral:   10.40 cm/s LVOT diam:     2.00 cm   LV E/e' lateral: 7.2 LV SV:         70 LV SV Index:   43 LVOT Area:     3.14  cm  RIGHT VENTRICLE RV S prime:     13.80 cm/s TAPSE (M-mode): 2.4 cm LEFT ATRIUM             Index        RIGHT ATRIUM           Index LA diam:        2.50 cm 1.51 cm/m   RA Area:     11.90 cm LA Vol (A2C):   25.5 ml 15.45 ml/m  RA Volume:   27.00 ml  16.36 ml/m LA Vol (A4C):   34.3 ml 20.78 ml/m LA Biplane Vol: 30.5 ml 18.48 ml/m  AORTIC VALVE AV Area (Vmax):    2.51 cm AV Area (Vmean):   2.93 cm AV Area (VTI):     2.90 cm AV Vmax:           129.87 cm/s AV Vmean:          74.026 cm/s AV VTI:            0.242 m AV Peak Grad:      6.7 mmHg AV Mean Grad:      2.7 mmHg LVOT Vmax:         103.65 cm/s LVOT Vmean:        69.150 cm/s LVOT VTI:          0.224 m LVOT/AV VTI ratio: 0.92  AORTA Ao Root diam: 3.30 cm MITRAL VALVE MV Area (PHT): 2.52 cm    SHUNTS MV Decel Time: 302 msec    Systemic VTI:  0.22 m MV E velocity: 74.53 cm/s  Systemic Diam: 2.00 cm MV A velocity: 96.85 cm/s MV E/A ratio:  0.77 Carlyle Dolly MD Electronically signed by Carlyle Dolly MD Signature Date/Time: 10/24/2022/3:39:14 PM    Final    CT ABDOMEN PELVIS WO CONTRAST  Result Date: 10/24/2022 CLINICAL DATA:  Acute anemia, hypertension, chronic anticoagulation EXAM: CT ABDOMEN AND PELVIS WITHOUT CONTRAST TECHNIQUE: Multidetector CT imaging of the abdomen and pelvis was performed following the standard protocol without IV contrast. RADIATION DOSE REDUCTION: This exam was performed according to the departmental dose-optimization program which includes automated exposure control, adjustment of the mA and/or kV according to patient size and/or use of iterative reconstruction technique. COMPARISON:  10/05/2022 FINDINGS: Lower chest: Moderate left pleural effusion with compressive atelectasis of the left lung base. Extensive multi-vessel coronary artery calcification. Global cardiac size within normal limits. Hepatobiliary: The liver is shrunken with hypertrophy of the caudate lobe, subtle contour nodularity, and heterogeneity of the  parenchyma which may  relate to changes of underlying cirrhosis. Superimposed hepatic steatosis. Cholecystectomy has been performed. Mild extrahepatic biliary ductal dilation may represent post cholecystectomy change. Pancreas: Atrophic, but otherwise unremarkable. Spleen: Unremarkable Adrenals/Urinary Tract: The adrenal glands are unremarkable. The kidneys are normal in position. Mild left renal cortical atrophy. Right kidney is normal in size. No hydronephrosis. 2 mm nonobstructing calculi scattered within the left kidney. 2 mm nonobstructing calculus noted within the distal left ureter at the ureterovesicular junction. No hydronephrosis. Bladder is unremarkable. Stomach/Bowel: Stomach, small bowel, and large bowel are unremarkable. Appendix absent. No free intraperitoneal gas. Mild ascites. Vascular/Lymphatic: Moderate aortoiliac atherosclerotic calcification. No aortic aneurysm. No pathologic adenopathy within the abdomen and pelvis. Reproductive: Status post hysterectomy. No adnexal masses. Other: Moderate diffuse subcutaneous body wall edema. No abdominal wall hernia. No retroperitoneal hematoma identified. Musculoskeletal: Osseous structures are diffusely osteopenic. Remote compression deformity of L2 noted with approximately 50% loss of height. No acute bone abnormality. IMPRESSION: 1. No acute intra-abdominal pathology identified. No definite radiographic explanation for the patient's reported symptoms. 2. Extensive multi-vessel coronary artery calcification. 3. Morphologic changes suspicious for underlying cirrhosis. Tissue sampling or hepatic elastography may be helpful for further evaluation. Superimposed hepatic steatosis. 4. Mild left nonobstructing nephrolithiasis. 2 mm nonobstructing calculus within the distal left ureter at the ureterovesicular junction. 5. Moderate left pleural effusion, mild ascites, and moderate diffuse subcutaneous body wall edema in keeping with anasarca. 6. Osteopenia with  remote compression deformity of L2. 7.  Aortic Atherosclerosis (ICD10-I70.0). Electronically Signed   By: Fidela Salisbury M.D.   On: 10/24/2022 03:25   DG Chest Port 1 View  Result Date: 10/23/2022 CLINICAL DATA:  Shortness of breath, BILATERAL leg swelling, recent diagnosis of CHF EXAM: PORTABLE CHEST 1 VIEW COMPARISON:  Portable exam 1441 hours compared to 10/16/2022 FINDINGS: Upper normal heart size. Mediastinal contours and pulmonary vascularity normal. 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 Mild LEFT basilar atelectasis. No pulmonary infiltrate, pleural effusion, or pneumothorax. Bones demineralized. IMPRESSION: COPD changes with LEFT basilar atelectasis. Emphysema (ICD10-J43.9). Electronically Signed   By: Lavonia Dana M.D.   On: 10/23/2022 14:48     Scheduled Meds:  atorvastatin  20 mg Oral Daily   Chlorhexidine Gluconate Cloth  6 each Topical Daily   feeding supplement  237 mL Oral BID BM   fidaxomicin  200 mg Oral BID   levothyroxine  100 mcg Oral Q0600   midodrine  5 mg Oral TID WC   mupirocin ointment  1 Application Nasal BID   Continuous Infusions:  norepinephrine (LEVOPHED) Adult infusion 2 mcg/min (10/24/22 1631)     LOS: 1 day    Roxan Hockey M.D on 10/24/2022 at 7:20 PM  Go to [email or web address] - for contact info  [facility] - Office  [phone]  If 7PM-7AM, please contact night-coverage [email or web address] 10/24/2022, 7:20 PM

## 2022-10-24 NOTE — TOC Initial Note (Signed)
Transition of Care [facility]) - Initial/Assessment Note    Patient Details  Name: Tasha Roberts MRN: [id number] Date of Birth: May 27, 1941  Transition of Care [facility]) CM/SW Contact:    Shade Flood, LCSW Phone Number: 10/24/2022, 10:49 AM  Clinical Narrative:                  Pt admitted from home. She was recently discharged from [facility]. Pt went home with [facility] RN and PT from [facility]. Patient resides with daughter, [name]. Considered high risk for readmission. Pt has rollator, hospital bed, recliner, and bsc in the home. Daughter takes to appointments.   Anticipating pt will return home with resumption of HH over the weekend.  TOC will be available if any new needs arise.  Expected Discharge Plan: [place] Barriers to Discharge: Continued Medical Work up   Patient Goals and CMS Choice        Expected Discharge Plan and Services Expected Discharge Plan: [place] In-house Referral: Clinical Social Work     Living arrangements for the past 2 months: [place]                                      Prior Living Arrangements/Services Living arrangements for the past 2 months: Single Family Home Lives with:: Adult Children Patient language and need for interpreter reviewed:: Yes Do you feel safe going back to the place where you live?: Yes      Need for Family Participation in Patient Care: Yes (Comment) Care giver support system in place?: Yes (comment) Current home services: DME, Home PT, Home RN Criminal Activity/Legal Involvement Pertinent to Current Situation/Hospitalization: No - Comment as needed  Activities of Daily Living Home Assistive Devices/Equipment: Walker (specify type) (Rollator\) ADL Screening (condition at time of admission) Patient's cognitive ability adequate to safely complete daily activities?: Yes Is the patient deaf or have difficulty hearing?: No Does the patient have difficulty seeing, even when  wearing glasses/contacts?: No Does the patient have difficulty concentrating, remembering, or making decisions?: No Patient able to express need for assistance with ADLs?: Yes Does the patient have difficulty dressing or bathing?: Yes Independently performs ADLs?: No Communication: Independent Dressing (OT): Independent Grooming: Independent Feeding: Independent Bathing: Needs assistance Is this a change from baseline?: Pre-admission baseline Toileting: Independent In/Out Bed: Needs assistance Is this a change from baseline?: Pre-admission baseline Walks in Home: Independent with device (comment) Does the patient have difficulty walking or climbing stairs?: Yes Weakness of Legs: Both Weakness of Arms/Hands: Both  Permission Sought/Granted                  Emotional Assessment       Orientation: : Oriented to Self, Oriented to Place, Oriented to  Time, Oriented to Situation Alcohol / Substance Use: Not Applicable Psych Involvement: No (comment)  Admission diagnosis:  Peripheral edema [R60.9] Hypotension [I95.9] Hypotension, unspecified hypotension type [I95.9] Patient Active Problem List   Diagnosis Date Noted   Hypotension 10/23/2022   Acute anemia 10/23/2022   Extremity edema 10/23/2022   Acute kidney injury superimposed on chronic kidney disease ([place]) 10/23/2022   Colitis 10/16/2022   Diarrhea 10/15/2022   Urge incontinence of urine 09/01/2022   Oral phase dysphagia 09/01/2022   Moderate protein-calorie malnutrition ([place]) 09/01/2022   Skin ulcer of sacrum, limited to breakdown of skin ([place]) 08/12/2022   Closed displaced fracture  of sixth cervical vertebra with routine healing 08/06/2022   Chronic anemia 07/27/2022   UTI (urinary tract infection) 07/25/2022   Compression fracture of L2 ([place]) 07/25/2022   Abdominal cramping 04/07/2022   Left ureteral stone 03/28/2022   Nausea & vomiting 03/28/2022   Serrated adenoma of colon 01/09/2022   Depression, major,  single episode, moderate ([place]) 12/05/2021   Gastroesophageal reflux disease 12/05/2021   Physical deconditioning 12/05/2021   Mixed hyperlipidemia 12/05/2021   Essential hypertension 07/31/2021   Age related osteoporosis 07/31/2021   Acquired hypothyroidism 07/31/2021   Chronic diarrhea 07/31/2021   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   Iron deficiency anemia due to chronic blood loss 03/04/2021   Thrombocytopenia ([place]) 03/04/2021   Acquired thrombophilia ([place]) 02/21/2021   CAD (coronary artery disease)    Lichen planus 41/28/2081   AF (paroxysmal atrial fibrillation) ([place]) 10/02/2020   Liver cirrhosis secondary to NASH ([place]) 10/02/2020   Graves disease 38/87/1959   Nonalcoholic steatohepatitis ([facility]) 10/12/2012   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   Vitamin D deficiency 04/13/2007   DM II (diabetes mellitus, type II), controlled ([place]) 08/27/2005   Sarcoidosis 1965   PCP:  Lindell Spar, MD Pharmacy:   [place], [place] [place] [address] [place] [address] Phone: [phone] Fax: [phone]     Social Determinants of Health (SDOH) Interventions Housing Interventions: Intervention Not Indicated  Readmission Risk Interventions    10/24/2022   10:48 AM 07/25/2022    3:44 PM  Readmission Risk Prevention Plan  Transportation Screening Complete Complete  HRI or Home Care Consult Complete Complete  Social Work Consult for [place] Planning/Counseling Complete Complete  Palliative Care Screening  Complete  Medication Review Press photographer) Complete Complete

## 2022-10-25 DIAGNOSIS — I9589 Other hypotension: Secondary | ICD-10-CM | POA: Diagnosis not present

## 2022-10-25 DIAGNOSIS — I480 Paroxysmal atrial fibrillation: Secondary | ICD-10-CM | POA: Diagnosis not present

## 2022-10-25 DIAGNOSIS — K7581 Nonalcoholic steatohepatitis (NASH): Secondary | ICD-10-CM | POA: Diagnosis not present

## 2022-10-25 DIAGNOSIS — N179 Acute kidney failure, unspecified: Secondary | ICD-10-CM | POA: Diagnosis not present

## 2022-10-25 LAB — BASIC METABOLIC PANEL
Anion gap: 4 — ABNORMAL LOW (ref 5–15)
BUN: 19 mg/dL (ref 8–23)
CO2: 20 mmol/L — ABNORMAL LOW (ref 22–32)
Calcium: 8 mg/dL — ABNORMAL LOW (ref 8.9–10.3)
Chloride: 112 mmol/L — ABNORMAL HIGH (ref 98–111)
Creatinine, Ser: 2.09 mg/dL — ABNORMAL HIGH (ref 0.44–1.00)
GFR, Estimated: 24 mL/min — ABNORMAL LOW (ref >60)
Glucose, Bld: 98 mg/dL (ref 70–99)
Potassium: 4.7 mmol/L (ref 3.5–5.1)
Sodium: 136 mmol/L (ref 135–145)

## 2022-10-25 LAB — CBC
HCT: 29.6 % — ABNORMAL LOW (ref 36.0–46.0)
Hemoglobin: 10 g/dL — ABNORMAL LOW (ref 12.0–15.0)
MCH: 29.2 pg (ref 26.0–34.0)
MCHC: 33.8 g/dL (ref 30.0–36.0)
MCV: 86.3 fL (ref 80.0–100.0)
Platelets: 92 10*3/uL — ABNORMAL LOW (ref 150–400)
RBC: 3.43 MIL/uL — ABNORMAL LOW (ref 3.87–5.11)
RDW: 19 % — ABNORMAL HIGH (ref 11.5–15.5)
WBC: 3.5 10*3/uL — ABNORMAL LOW (ref 4.0–10.5)
nRBC: 0 % (ref 0.0–0.2)

## 2022-10-25 MED ORDER — FIDAXOMICIN 200 MG PO TABS: 200.0000 mg | ORAL_TABLET | Freq: Two times a day (BID) | ORAL | 0 refills | Status: DC

## 2022-10-25 MED ORDER — MIDODRINE HCL 5 MG PO TABS: 5.0000 mg | ORAL_TABLET | Freq: Three times a day (TID) | ORAL | 1 refills | Status: DC

## 2022-10-25 NOTE — TOC Transition Note (Signed)
Transition of Care [facility]) - CM/SW Discharge Note   Patient Details  Name: Tasha Roberts MRN: [id number] Date of Birth: December 06, 1941  [facility]) CM/SW Contact:  Joanne Chars, LCSW Phone Number: 10/25/2022, 1:51 PM   Clinical Narrative:   Pt [facility] with [facility].  CSW spoke with pt [name] and confirmed she is able to pick pt up.  No DME needs.      Barriers to Discharge: Barriers Resolved   Patient Goals and CMS Choice        Discharge Placement                  Name of family member notified: [name] Patient and family notified of of transfer: 10/25/22  Discharge Plan and Services In-house Referral: Clinical Social Work                        HH Arranged: Therapist, sports, PT [place] Agency: [place] ([place]) Date [place]: 10/25/22 Time [place]: 1351 Representative spoke with at [place]: [place] ([place]) Interventions Housing Interventions: Intervention Not Indicated   Readmission Risk Interventions    10/24/2022   10:48 AM 07/25/2022    3:44 PM  Readmission Risk Prevention Plan  Transportation Screening Complete Complete  HRI or Home Care Consult Complete Complete  Social Work Consult for [place] Planning/Counseling Complete Complete  Palliative Care Screening  Complete  Medication Review Press photographer) Complete Complete

## 2022-10-25 NOTE — Discharge Instructions (Addendum)
1)Avoid ibuprofen/Advil/Aleve/Motrin/Goody Powders/Naproxen/BC powders/Meloxicam/Diclofenac/Indomethacin and other Nonsteroidal anti-inflammatory medications as these will make you more likely to bleed and can cause stomach ulcers, can also cause Kidney problems.   2)Stop Lisinopril HCTZ--- Stop Lisinopril-Hydrochlorothiazide  3)Repeat CBC and CMP blood test around Tuesday, 10/28/2022  4)Follow-up with your Gastroenterologist at [facility] as previously advised

## 2022-10-25 NOTE — Progress Notes (Signed)
Patient passed type 6 mushy stools, took a sample for C-diff. Dr Joesph Fillers asked not to send a C-diff sample. Sample discarded. Will continue to monitor.

## 2022-10-25 NOTE — Discharge Summary (Signed)
Tasha Roberts, is a 81 y.o. female  DOB 07-08-1941  MRN [id number].  Admission date:  10/23/2022  Admitting Physician  Bethena Roys, MD  Discharge Date:  10/25/2022   Primary MD  Lindell Spar, MD  Recommendations for primary care physician for things to follow:   1)Avoid ibuprofen/Advil/Aleve/Motrin/Goody Powders/Naproxen/BC powders/Meloxicam/Diclofenac/Indomethacin and other Nonsteroidal anti-inflammatory medications as these will make you more likely to bleed and can cause stomach ulcers, can also cause Kidney problems.   2)Stop Lisinopril HCTZ--- Stop Lisinopril-Hydrochlorothiazide  3)Repeat CBC and CMP blood test around Tuesday, 10/28/2022  4)Follow-up with your Gastroenterologist at [facility] as previously advised  Admission Diagnosis  Peripheral edema [R60.9] Hypotension [I95.9] Hypotension, unspecified hypotension type [I95.9]   Discharge Diagnosis  Peripheral edema [R60.9] Hypotension [I95.9] Hypotension, unspecified hypotension type [I95.9]  ***  Principal Problem:   Hypotension Active Problems:   Acute anemia   AF (paroxysmal atrial fibrillation) (HCC)   Extremity edema   Liver cirrhosis secondary to NASH ([place])   DM II (diabetes mellitus, type II), controlled ([place])   Acute kidney injury superimposed on chronic kidney disease ([place])      Past Medical History:  Diagnosis Date   Acute kidney injury superimposed on chronic kidney disease ([place]) 10/23/2022   Age related osteoporosis    Anemia due to blood loss    iron infusion's last one 03-25-2021 (pt bleeding post vulvar bx 02-11-2021, hospiatal admission 02-21-2021)   Anticoagulant long-term use    eliquis--- managed by pcp    Arthritis    hands   Bronchiectasis [facility])    pulmonology --- dr Halford Chessman   (04-01-2021  pt stated no supplemental oxygen use since 01/ 2022 and no inhaler use,  stated checks O2 at home during the day 98-99%)    Cancer ([place]) 2013   Lumpectomy left breast   Cataract 2015   Chronic constipation    Clotting disorder ([place])    On eliquis   Coronary artery calcification    per lexiscan 08-17-2019 result in care everywhere   Depression, major, single episode, moderate ([place]) 12/05/2021   DOE (dyspnea on exertion)    04-01-2021  per pt only going up stairs    Gastroesophageal reflux disease 12/05/2021   History of diabetes mellitus, type II    04-01-2021  per pt no issues after weight loss, last taken medication approx 2017   History of Graves' disease 2017   s/p RAI   History of left breast cancer    dx 2013,  s/p left partial mastecotmy w/ node dissection 09-15-2012, low grade DCIS, 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, completed radiation 02/ 2014,  per pt no recurrence   History of respiratory syncytial virus (RSV) infection 09/2020   w/ hypoxia  , hospital admission 10/ 2021  and acute exacerbation bronchiectasis;  follow up post hospital pulmonogy dr Halford Chessman  note in epic   Hyperlipidemia    Hypertension    followed by pcp   (pt had lexiscan in care everywhere dated 08-17-2019 no ischemia , non-obstructive extensive calcification, ef 69%)  Hypothyroidism    Hypothyroidism, postradioiodine therapy    followed by pcp   Liver cirrhosis secondary to NASH (nonalcoholic steatohepatitis) ([place])    followed by dr Merrilee Jansky ([facility])---- dx 2004,  compensated ,  last liver bx (03/ 2015) fibrosis stage 3;  moderate portal hypertensive gastrophy   PAF (paroxysmal atrial fibrillation) ([place]) previous cardiologist lov note w/ Suann Larry PA ([facility] in [place]) dated 01-24-2020 in care everywhere;  (04-01-2021 per pt has appt w/ new cardiologist , dr j. 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 kDelton Coombes  VIN III (vulvar intraepithelial neoplasia III)     Past Surgical History:  Procedure Laterality Date   [place]  BREAST SURGERY  09/15/2012   CATARACT EXTRACTION W/ INTRAOCULAR LENS  IMPLANT, BILATERAL  2016   CHOLECYSTECTOMY OPEN  13716  CO2 LASER APPLICATION N/A [id number]  Procedure: CO2 LASER APPLICATION OF THE VULVA;  Surgeon: REveritt Amber MD;  [facility]  Service: Gynecology;  Laterality: N/A;   COLONOSCOPY WITH ESOPHAGOGASTRODUODENOSCOPY (EGD)  last one 08/ 2018  @[facility]    EYE SURGERY  2015   Cataract removed both eyes   LUMBAR LAMINECTOMY  2009   PARTIAL MASTECTOMY WITH AXILLARY SENTINEL LYMPH NODE BIOPSY [facility]  TUBAL LIGATION  08/08/1969       HPI  from the history and physical done on the day of admission:     ***  ****     Hospital Course:     No notes on file  ***** Assessment and Plan: * Hypotension Hypotension, blood pressure down to 77/38, 1 L bolus given, subsequently started on Levophed drip.  Blood pressure now 120s over 55.  Recent hospitalization for same-complaint of diarrhea.  Etiology this time likely related to recent diarrhea and dehydration, hypoalbuminemia, and antihypertensives. -Wean off peripheral Levophed -Hold Cardizem, lisinopril HCTZ -Resume midodrine 5 mg 3 times daily  Acute anemia Hemoglobin 8.8,  gradual down trend from 12.3- 3 weeks ago.   No melena, no hematochezia or hematemesis.  Hypotensive.  Stool occult negative.  She has extensive ecchymosis to her  extremities, she denies falls. -Obtain CT abdomen and pelvis without contrast, rule out intra-abdominal hemorrhage -With complaints of dyspnea, she was transfused in the ED - Monitor hemoglobin -Hold Eliquis for now pending CBC in a.m. and CT findings  Extremity edema Reports edema to bilateral lower extremities more so than upper extremities.  Likely related to hypoalbuminemia 2.9, NASH liver cirrhosis.  Received albumin twice during recent hospitalization - Hold off on diuresis for now with initial hypotension -Obtain echo -May benefit from IV albumin prior to discharge   AF (paroxysmal atrial fibrillation) ([place] Rate controlled and on anticoagulation with Eliquis. -Hold Eliquis for now with acute anemia and hypotension -Hold Cardizem  Acute kidney injury superimposed on chronic kidney disease ([place]) AKI on CKD 4.  Creatinine 2.5.  Baseline about 1.8.  Likely prerenal from hypotension. -1 unit transfused, 1 unit of blood given. -Hold lisinopril- HCTZ  DM II (diabetes mellitus, type II), controlled ([place]) - HgbA1c- 4.3 -Diet controlled        Discharge Condition: ***  Follow UP   Follow-up Information     [facility].   Why: [place] will call to schedule your next appointment.  Please contact them if you have not heard from them within 48 hours. Contact information: P: [phone]        Lindell Spar, MD. Schedule an appointment as soon as possible for a visit.   Specialty: Internal Medicine Why: Repeat CBC and CMP Blood Tests Contact information: [place] [address] [phone]                  Consults obtained - ***  Diet and Activity recommendation:  As advised  Discharge Instructions    **** Discharge Instructions     Call MD for:  difficulty breathing, headache or visual disturbances   Complete by: As directed    Call MD for:  persistant dizziness or light-headedness   Complete  by: As directed    Call MD for:  persistant nausea and vomiting   Complete by: As directed    Call MD for:  temperature >100.4   Complete by: As directed    Diet - low sodium heart healthy   Complete by: As directed    Discharge instructions   Complete by: As directed    1)Avoid ibuprofen/Advil/Aleve/Motrin/Goody Powders/Naproxen/BC powders/Meloxicam/Diclofenac/Indomethacin and other Nonsteroidal anti-inflammatory medications as these will make you more likely to bleed and can cause stomach ulcers, can also cause Kidney problems.   2)Stop Lisinopril HCTZ--- Stop Lisinopril-Hydrochlorothiazide  3)Repeat CBC and CMP blood test around Tuesday, 10/28/2022  4)Follow-up with your Gastroenterologist at [facility] as previously advised   Increase activity slowly   Complete by: As directed          Discharge Medications     Allergies as of 10/25/2022       Reactions   Dicyclomine Other (See Comments)   Dry cough   Glucophage [metformin] Diarrhea   Losartan Other (See Comments)   Heartburn        Medication List     STOP taking these medications    fiber supplement (BANATROL TF) liquid   lisinopril-hydrochlorothiazide 20-25 MG tablet Commonly known as: ZESTORETIC   multivitamin with minerals Tabs tablet       TAKE these medications    acidophilus Caps capsule Take 2 capsules by mouth 3 (three) times daily for 10 days.   apixaban 2.5 MG Tabs tablet Commonly known as: ELIQUIS Take 1 tablet (2.5 mg total) by mouth 2 (two) times daily.   atorvastatin 20 MG tablet Commonly known as: LIPITOR TAKE (1) TABLET BY MOUTH ONCE DAILY. What changed: See the new instructions.   Cholecalciferol 50 MCG (2000 UT) Caps Take 1 capsule (2,000 Units total) by mouth daily.   diltiazem 120 MG 24 hr capsule Commonly known as: CARDIZEM CD TAKE (1) CAPSULE BY MOUTH ONCE DAILY. What changed: See the new instructions.   feeding supplement Liqd Take 237 mLs by mouth 2 (two) times daily  between meals.   fidaxomicin 200 MG Tabs tablet Commonly known as: DIFICID Take 1 tablet (200 mg total) by mouth 2 (two) times  daily for 10 days   Gerhardt's butt cream Crea Apply 1 Application topically 3 (three) times daily.   levothyroxine 100 MCG tablet Commonly known as: SYNTHROID Take 1 tablet (100 mcg total) by mouth daily before breakfast.   midodrine 5 MG tablet Commonly known as: PROAMATINE Take 1 tablet (5 mg total) by mouth 3 (three) times daily with meals.   nitroGLYCERIN 0.4 MG SL tablet Commonly known as: NITROSTAT Place 1 tablet (0.4 mg total) under the tongue every 5 (five) minutes as needed for chest pain.   ondansetron 4 MG disintegrating tablet Commonly known as: ZOFRAN-ODT Take 1 tablet (4 mg total) by mouth every 8 (eight) hours as needed for nausea or vomiting.   tolterodine 4 MG 24 hr capsule Commonly known as: Detrol LA Take 1 capsule (4 mg total) by mouth daily.   vitamin B-12 500 MCG tablet Commonly known as: CYANOCOBALAMIN Take 500 mcg by mouth daily.        Major procedures and Radiology Reports - PLEASE review detailed and final reports for all details, in brief -   ***  ECHOCARDIOGRAM COMPLETE  Result Date: 10/24/2022    ECHOCARDIOGRAM REPORT   Patient Name:   [facility] [place] Date of Exam: 10/24/2022 Medical Rec #:  [id number]   Height:       63.0 in Accession #:    [id number]  Weight:       137.8 lb Date of Birth:  [birth year]    BSA:          1.650 m Patient Age:    25 years    BP:           103/36 mmHg Patient Gender: F           HR:           75 bpm. Exam Location:  [facility] Procedure: 2D Echo, Cardiac Doppler and Color Doppler Indications:    Bilateral lower extremity edema  History:        Patient has no prior history of Echocardiogram examinations.                 CAD, Arrythmias:Atrial Fibrillation; Risk Factors:Hypertension,                 Diabetes and Dyslipidemia. DCIS (ductal carcinoma in situ) of                 breast.  Sonographer:     Alvino Chapel RCS Referring Phys: [phone] [place]  1. Left ventricular ejection fraction, by estimation, is 60 to 65%. The left ventricle has normal function. The left ventricle has no regional wall motion abnormalities. Left ventricular diastolic parameters are indeterminate.  2. Right ventricular systolic function is normal. The right ventricular size is normal.  3. The mitral valve is normal in structure. No evidence of mitral valve regurgitation. No evidence of mitral stenosis.  4. The aortic valve is tricuspid. There is mild calcification of the aortic valve. There is mild thickening of the aortic valve. Aortic valve regurgitation is not visualized. No aortic stenosis is present.  5. The inferior vena cava is normal in size with greater than 50% respiratory variability, suggesting right atrial pressure of 3 mmHg. FINDINGS  Left Ventricle: Left ventricular ejection fraction, by estimation, is 60 to 65%. The left ventricle has normal function. The left ventricle has no regional wall motion abnormalities. The left ventricular internal cavity size was normal in size. There is  no left ventricular hypertrophy.  Left ventricular diastolic parameters are indeterminate. Right Ventricle: The right ventricular size is normal. Right vetricular wall thickness was not well visualized. Right ventricular systolic function is normal. Left Atrium: Left atrial size was normal in size. Right Atrium: Right atrial size was normal in size. Pericardium: There is no evidence of pericardial effusion. Mitral Valve: The mitral valve is normal in structure. There is mild thickening of the mitral valve leaflet(s). There is mild calcification of the mitral valve leaflet(s). Mild mitral annular calcification. No evidence of mitral valve regurgitation. No evidence of mitral valve stenosis. Tricuspid Valve: The tricuspid valve is not well visualized. Tricuspid valve regurgitation is not demonstrated. No evidence of  tricuspid stenosis. Aortic Valve: The aortic valve is tricuspid. There is mild calcification of the aortic valve. There is mild thickening of the aortic valve. There is mild aortic valve annular calcification. Aortic valve regurgitation is not visualized. No aortic stenosis  is present. Aortic valve mean gradient measures 2.7 mmHg. Aortic valve peak gradient measures 6.7 mmHg. Aortic valve area, by VTI measures 2.90 cm. Pulmonic Valve: The pulmonic valve was not well visualized. Pulmonic valve regurgitation is not visualized. No evidence of pulmonic stenosis. Aorta: The aortic root is normal in size and structure. Venous: The inferior vena cava is normal in size with greater than 50% respiratory variability, suggesting right atrial pressure of 3 mmHg. IAS/Shunts: No atrial level shunt detected by color flow Doppler.  LEFT VENTRICLE PLAX 2D LVIDd:         5.10 cm   Diastology LVIDs:         2.90 cm   LV e' medial:    6.42 cm/s LV PW:         1.00 cm   LV E/e' medial:  11.6 LV IVS:        0.90 cm   LV e' lateral:   10.40 cm/s LVOT diam:     2.00 cm   LV E/e' lateral: 7.2 LV SV:         70 LV SV Index:   43 LVOT Area:     3.14 cm  RIGHT VENTRICLE RV S prime:     13.80 cm/s TAPSE (M-mode): 2.4 cm LEFT ATRIUM             Index        RIGHT ATRIUM           Index LA diam:        2.50 cm 1.51 cm/m   RA Area:     11.90 cm LA Vol (A2C):   25.5 ml 15.45 ml/m  RA Volume:   27.00 ml  16.36 ml/m LA Vol (A4C):   34.3 ml 20.78 ml/m LA Biplane Vol: 30.5 ml 18.48 ml/m  AORTIC VALVE AV Area (Vmax):    2.51 cm AV Area (Vmean):   2.93 cm AV Area (VTI):     2.90 cm AV Vmax:           129.87 cm/s AV Vmean:          74.026 cm/s AV VTI:            0.242 m AV Peak Grad:      6.7 mmHg AV Mean Grad:      2.7 mmHg LVOT Vmax:         103.65 cm/s LVOT Vmean:        69.150 cm/s LVOT VTI:          0.224 m LVOT/AV VTI ratio: 0.92  AORTA Ao Root diam: 3.30 cm MITRAL VALVE MV Area (PHT): 2.52 cm    SHUNTS MV Decel Time: 302 msec     Systemic VTI:  0.22 m MV E velocity: 74.53 cm/s  Systemic Diam: 2.00 cm MV A velocity: 96.85 cm/s MV E/A ratio:  0.77 Carlyle Dolly MD Electronically signed by Carlyle Dolly MD Signature Date/Time: 10/24/2022/3:39:14 PM    Final    CT ABDOMEN PELVIS WO CONTRAST  Result Date: 10/24/2022 CLINICAL DATA:  Acute anemia, hypertension, chronic anticoagulation EXAM: CT ABDOMEN AND PELVIS WITHOUT CONTRAST TECHNIQUE: Multidetector CT imaging of the abdomen and pelvis was performed following the standard protocol without IV contrast. RADIATION DOSE REDUCTION: This exam was performed according to the departmental dose-optimization program which includes automated exposure control, adjustment of the mA and/or kV according to patient size and/or use of iterative reconstruction technique. COMPARISON:  10/05/2022 FINDINGS: Lower chest: Moderate left pleural effusion with compressive atelectasis of the left lung base. Extensive multi-vessel coronary artery calcification. Global cardiac size within normal limits. Hepatobiliary: The liver is shrunken with hypertrophy of the caudate lobe, subtle contour nodularity, and heterogeneity of the parenchyma which may relate to changes of underlying cirrhosis. Superimposed hepatic steatosis. Cholecystectomy has been performed. Mild extrahepatic biliary ductal dilation may represent post cholecystectomy change. Pancreas: Atrophic, but otherwise unremarkable. Spleen: Unremarkable Adrenals/Urinary Tract: The adrenal glands are unremarkable. The kidneys are normal in position. Mild left renal cortical atrophy. Right kidney is normal in size. No hydronephrosis. 2 mm nonobstructing calculi scattered within the left kidney. 2 mm nonobstructing calculus noted within the distal left ureter at the ureterovesicular junction. No hydronephrosis. Bladder is unremarkable. Stomach/Bowel: Stomach, small bowel, and large bowel are unremarkable. Appendix absent. No free intraperitoneal gas. Mild  ascites. Vascular/Lymphatic: Moderate aortoiliac atherosclerotic calcification. No aortic aneurysm. No pathologic adenopathy within the abdomen and pelvis. Reproductive: Status post hysterectomy. No adnexal masses. Other: Moderate diffuse subcutaneous body wall edema. No abdominal wall hernia. No retroperitoneal hematoma identified. Musculoskeletal: Osseous structures are diffusely osteopenic. Remote compression deformity of L2 noted with approximately 50% loss of height. No acute bone abnormality. IMPRESSION: 1. No acute intra-abdominal pathology identified. No definite radiographic explanation for the patient's reported symptoms. 2. Extensive multi-vessel coronary artery calcification. 3. Morphologic changes suspicious for underlying cirrhosis. Tissue sampling or hepatic elastography may be helpful for further evaluation. Superimposed hepatic steatosis. 4. Mild left nonobstructing nephrolithiasis. 2 mm nonobstructing calculus within the distal left ureter at the ureterovesicular junction. 5. Moderate left pleural effusion, mild ascites, and moderate diffuse subcutaneous body wall edema in keeping with anasarca. 6. Osteopenia with remote compression deformity of L2. 7.  Aortic Atherosclerosis (ICD10-I70.0). Electronically Signed   By: Fidela Salisbury M.D.   On: 10/24/2022 03:25   DG Chest Port 1 View  Result Date: 10/23/2022 CLINICAL DATA:  Shortness of breath, BILATERAL leg swelling, recent diagnosis of CHF EXAM: PORTABLE CHEST 1 VIEW COMPARISON:  Portable exam 1441 hours compared to 10/16/2022 FINDINGS: Upper normal heart size. Mediastinal contours and pulmonary vascularity normal. 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 Mild LEFT basilar atelectasis. No pulmonary infiltrate, pleural effusion, or pneumothorax. Bones demineralized. IMPRESSION: COPD changes with LEFT basilar atelectasis. Emphysema (ICD10-J43.9). Electronically Signed   By: Lavonia Dana M.D.   On: 10/23/2022 14:48   DG CHEST  PORT 1 VIEW  Result Date: 10/16/2022 CLINICAL DATA:  PICC placement. EXAM: PORTABLE CHEST 1 VIEW COMPARISON:  09/29/2022, additional priors reviewed. FINDINGS: Tip of the right upper extremity PICC overlies the SVC. There is no pneumothorax.  Stable heart size and mediastinal contours. Small left pleural effusion with ill-defined opacity at the left lung base. Minimal ill-defined opacity in the right upper lung zone may represent overlap of the first rib costal cartilage and have been present on prior exams. No pulmonary edema. The bones are under mineralized IMPRESSION: 1. Tip of the right upper extremity PICC overlies the SVC. No pneumothorax. 2. Small left pleural effusion with ill-defined left lung base opacity, may represent atelectasis or pneumonia. Electronically Signed   By: Keith Rake M.D.   On: 10/16/2022 19:14   [place] EKG SITE RITE  Result Date: 10/16/2022 If Site Rite image not attached, placement could not be confirmed due to current cardiac rhythm.  CT ABDOMEN PELVIS WO CONTRAST  Addendum Date: 10/05/2022   ADDENDUM REPORT: 10/05/2022 22:12 COMPARISON:  CT abdomen and pelvis 03/27/2022. Lumbar spine CT 07/23/2022. FINDINGS: Lower chest: Moderate-sized left pleural effusion with compressive atelectasis of the left lower lobe. Hepatobiliary: Diffuse fatty infiltration. There is nodular liver contour suggesting cirrhosis similar to the prior study. The liver is diffusely heterogeneous. A calcified granulomas present. The gallbladder surgically absent. Pancreas: Unremarkable. No pancreatic ductal dilatation or surrounding inflammatory changes. Spleen: Mildly enlarged, unchanged.  Calcified granulomas are seen. Adrenals/Urinary Tract: There is a 2 mm calculus in the distal left ureter at the level of the ureterovesicular junction. There is no hydronephrosis. There are additional punctate nonobstructing left renal calculi. The right kidney, adrenal glands and bladder are within normal limits.  Stomach/Bowel: There is diffuse colonic wall thickening. There is no pneumatosis or free air. The appendix is not seen. No dilated bowel loops are identified. There is some questionable wall thickening of jejunal loops in the left upper quadrant. There is associated mesenteric edema. The stomach appears within normal limits. Vascular/Lymphatic: There are severe atherosclerotic calcifications of the aorta. Aorta and IVC are nondilated. No enlarged lymph nodes are seen. Reproductive: Status post hysterectomy. No adnexal masses. Other: Small volume ascites in all 4 abdominal quadrants. Diffuse body wall edema. Musculoskeletal: The bones are diffusely osteopenic. Compression fracture of L2 is again seen. Degree of compression has progressed compared to the prior study (now 50 percent). There is mild retropulsion of fracture fragments. IMPRESSION: 1. Diffuse colonic wall thickening compatible with colitis. 2. Wall thickening and mesenteric edema involving jejunal loops in the left upper quadrant concerning for nonspecific enteritis including infectious, inflammatory and ischemic etiologies. 3. Small volume ascites. 4. Cirrhotic liver. 5. Moderate-sized left pleural effusion. 6. Diffuse body wall edema. 7. 2 mm calculus in the distal left ureter without hydronephrosis. 8. Progression of L2 compression fracture. Aortic Atherosclerosis (ICD10-I70.0). These results were called by telephone at the time of interpretation on 10/05/2022 at 10:11 pm to provider [facility] ZAM[facility] , who verbally acknowledged these results. Electronically Signed   By: Ronney Asters M.D.   On: 10/05/2022 22:12   Result Date: 10/05/2022 CLINICAL DATA:  Acute abdominal pain. EXAM: CT ABDOMEN AND PELVIS WITHOUT CONTRAST TECHNIQUE: Multidetector CT imaging of the abdomen and pelvis was performed following the standard protocol without IV contrast. RADIATION DOSE REDUCTION: This exam was performed according to the departmental dose-optimization program  which includes automated exposure control, adjustment of the mA and/or kV according to patient size and/or use of iterative reconstruction technique. COMPARISON:  CT abdomen and pelvis 08/24/2022 FINDINGS: Lower chest: No acute abnormality. Hepatobiliary: Hypodensity in the dome of the liver is too small to characterize and unchanged, likely a cyst. Gallbladder is surgically absent. No bile duct dilatation. Pancreas: Unremarkable.  No pancreatic ductal dilatation or surrounding inflammatory changes. Spleen: Normal in size without focal abnormality. Adrenals/Urinary Tract: Bilateral renal cysts are unchanged measuring up to 1 cm. No hydronephrosis or perinephric stranding. Adrenal glands and bladder are within normal limits. Stomach/Bowel: There is moderate fluid distention of the stomach. There is no bowel obstruction, pneumatosis, bowel wall thickening or free air. The appendix is not seen. Small bowel and colonic loops are predominantly fluid-filled. Vascular/Lymphatic: Aortic atherosclerosis. No enlarged abdominal or pelvic lymph nodes. Reproductive: Status post hysterectomy. No adnexal masses. Other: No abdominal wall hernia or abnormality. No abdominopelvic ascites. Musculoskeletal: Degenerative changes affect the spine. 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 IMPRESSION: 1. Moderate fluid distention of the stomach. 2. Fluid-filled small bowel and colonic loops which can be seen in the setting of enteritis. No bowel obstruction. 3. 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 No follow-up imaging is recommended. JACR 2018 Feb; 264-273, Management of the Incidental Renal Mass on CT, [employer] 2021; 814-848, Bosniak Classification of Cystic Renal Masses, Version 2019. Aortic Atherosclerosis (ICD10-I70.0). Electronically Signed: By: Ronney Asters M.D. On: 10/05/2022 21:45   DG Chest Portable 1 View  Result Date: 09/29/2022 CLINICAL DATA:  hypotension, generalized weakness EXAM: PORTABLE CHEST 1 VIEW  COMPARISON:  Radiograph 08/04/2022, 03/27/2022 FINDINGS: Unchanged cardiomediastinal silhouette. There is no new focal airspace disease. Unchanged upper lung scarring. No pleural effusion. No evidence of pneumothorax. Thoracic spondylosis. Right upper quadrant surgical clips. Unchanged clips overlying the left axilla. Bilateral shoulder degenerative changes. No acute osseous abnormality. IMPRESSION: No evidence of acute cardiopulmonary disease. Electronically Signed   By: Maurine Simmering M.D.   On: 09/29/2022 10:52    Micro Results   *** Recent Results (from the past 240 hour(s))  C Difficile Quick Screen w PCR reflex     Status: Abnormal   Collection Time: 10/15/22  6:15 PM   Specimen: STOOL  Result Value Ref Range Status   C Diff antigen POSITIVE (A) NEGATIVE Final   C Diff toxin NEGATIVE NEGATIVE Final   C Diff interpretation Results are indeterminate. See PCR results.  Final    Comment: Performed at [facility], [address], [place], [place] [address]  Gastrointestinal Panel by PCR , Stool     Status: None   Collection Time: 10/15/22  6:15 PM   Specimen: STOOL  Result Value Ref Range Status   Campylobacter species NOT DETECTED NOT DETECTED Final   Plesimonas shigelloides NOT DETECTED NOT DETECTED Final   Salmonella species NOT DETECTED NOT DETECTED Final   Yersinia enterocolitica NOT DETECTED NOT DETECTED Final   Vibrio species NOT DETECTED NOT DETECTED Final   Vibrio cholerae NOT DETECTED NOT DETECTED Final   Enteroaggregative E coli (EAEC) NOT DETECTED NOT DETECTED Final   Enteropathogenic E coli (EPEC) NOT DETECTED NOT DETECTED Final   Enterotoxigenic E coli (ETEC) NOT DETECTED NOT DETECTED Final   Shiga like toxin producing E coli (STEC) NOT DETECTED NOT DETECTED Final   Shigella/Enteroinvasive E coli (EIEC) NOT DETECTED NOT DETECTED Final   Cryptosporidium NOT DETECTED NOT DETECTED Final   Cyclospora cayetanensis NOT DETECTED NOT DETECTED Final   Entamoeba histolytica NOT  DETECTED NOT DETECTED Final   Giardia lamblia NOT DETECTED NOT DETECTED Final   Adenovirus F40/41 NOT DETECTED NOT DETECTED Final   Astrovirus NOT DETECTED NOT DETECTED Final   Norovirus GI/GII NOT DETECTED NOT DETECTED Final   Rotavirus A NOT DETECTED NOT DETECTED Final   Sapovirus (I, II, IV, and V) NOT DETECTED NOT  DETECTED Final    Comment: Performed at [facility], [address] [place], [place] [address]  C. Diff by PCR, Reflexed     Status: Abnormal   Collection Time: 10/15/22  6:15 PM  Result Value Ref Range Status   Toxigenic C. Difficile by PCR POSITIVE (A) NEGATIVE Final    Comment: Positive for toxigenic C. difficile with little to no toxin production. Only treat if clinical presentation suggests symptomatic illness. Performed at [facility], [place] [address] [place], [place] [address]   MRSA Next Gen by PCR, Nasal     Status: Abnormal   Collection Time: 10/23/22  7:43 PM   Specimen: Nasal Mucosa; Nasal Swab  Result Value Ref Range Status   MRSA by PCR Next Gen DETECTED (A) NOT DETECTED Final    Comment: RESULT CALLED TO, READ BACK BY AND VERIFIED WITH: MAYNARD,R@[id number]  BY MATTHEWS,B 11.16.2023 (NOTE) The GeneXpert MRSA Assay (FDA approved for NASAL specimens only), is one component of a comprehensive MRSA colonization surveillance program. It is not intended to diagnose MRSA infection nor to guide or monitor treatment for MRSA infections. Test performance is not FDA approved in patients less than 42 years old. Performed at [facility], [address] [place], [place] [address]     Today   Subjective    Tasha Roberts today has no ***          Patient has been seen and examined prior to discharge   Objective   Blood pressure (!) 103/49, pulse 66, temperature 97.7 F (36.5 C), temperature source Oral, resp. rate 19, height 5' 3"  (1.6 m), weight 64.1 kg, SpO2 97 %.   Intake/Output Summary (Last 24 hours) at 10/25/2022 1430 Last data filed at  10/25/2022 0828 Gross per 24 hour  Intake 307.6 ml  Output 850 ml  Net -542.4 ml    Exam Gen:- Awake Alert, no acute distress *** HEENT:- [place].AT, No sclera icterus Neck-Supple Neck,No JVD,.  Lungs-  CTAB , good air movement bilaterally CV- S1, S2 normal, regular Abd-  +ve B.Sounds, Abd Soft, No tenderness,    Extremity/Skin:- No  edema,   good pulses Psych-affect is appropriate, oriented x3 Neuro-no new focal deficits, no tremors ***   Data Review   CBC w Diff:  Lab Results  Component Value Date   WBC 3.5 (L) 10/25/2022   HGB 10.0 (L) 10/25/2022   HGB 12.6 04/07/2022   HCT 29.6 (L) 10/25/2022   HCT 36.4 04/07/2022   PLT 92 (L) 10/25/2022   PLT 171 04/07/2022   LYMPHOPCT 20 10/23/2022   MONOPCT 11 10/23/2022   EOSPCT 2 10/23/2022   BASOPCT 0 10/23/2022    CMP:  Lab Results  Component Value Date   NA 136 10/25/2022   NA 145 (H) 05/09/2022   K 4.7 10/25/2022   CL 112 (H) 10/25/2022   CO2 20 (L) 10/25/2022   BUN 19 10/25/2022   BUN 26 05/09/2022   CREATININE 2.09 (H) 10/25/2022   PROT 4.7 (L) 10/23/2022   PROT 6.1 07/31/2021   ALBUMIN 2.9 (L) 10/23/2022   ALBUMIN 4.3 07/31/2021   BILITOT 1.2 10/23/2022   BILITOT 0.7 07/31/2021   ALKPHOS 71 10/23/2022   AST 33 10/23/2022   ALT 18 10/23/2022  .  Total Discharge time is about 33 minutes  Roxan Hockey M.D on 10/25/2022 at 2:30 PM  Go to [email or web address] -  for contact info  [facility] - Office  [phone]

## 2022-10-27 ENCOUNTER — Inpatient Hospital Stay: Payer: Medicare Other

## 2022-10-27 ENCOUNTER — Telehealth: Payer: Self-pay

## 2022-10-27 ENCOUNTER — Emergency Department (HOSPITAL_COMMUNITY): Payer: Medicare Other

## 2022-10-27 ENCOUNTER — Emergency Department (HOSPITAL_COMMUNITY)
Admission: EM | Admit: 2022-10-27 | Discharge: 2022-10-28 | Disposition: A | Discharge status: Home / Self Care | Payer: Medicare Other | Attending: Emergency Medicine | Admitting: Emergency Medicine

## 2022-10-27 ENCOUNTER — Other Ambulatory Visit: Payer: Self-pay

## 2022-10-27 ENCOUNTER — Encounter (HOSPITAL_COMMUNITY): Payer: Self-pay

## 2022-10-27 DIAGNOSIS — L03113 Cellulitis of right upper limb: Secondary | ICD-10-CM

## 2022-10-27 DIAGNOSIS — Z853 Personal history of malignant neoplasm of breast: Secondary | ICD-10-CM | POA: Diagnosis not present

## 2022-10-27 DIAGNOSIS — R079 Chest pain, unspecified: Secondary | ICD-10-CM

## 2022-10-27 DIAGNOSIS — I10 Essential (primary) hypertension: Secondary | ICD-10-CM | POA: Insufficient documentation

## 2022-10-27 DIAGNOSIS — R7989 Other specified abnormal findings of blood chemistry: Secondary | ICD-10-CM | POA: Diagnosis not present

## 2022-10-27 DIAGNOSIS — M7989 Other specified soft tissue disorders: Secondary | ICD-10-CM

## 2022-10-27 DIAGNOSIS — E119 Type 2 diabetes mellitus without complications: Secondary | ICD-10-CM | POA: Diagnosis not present

## 2022-10-27 DIAGNOSIS — E039 Hypothyroidism, unspecified: Secondary | ICD-10-CM | POA: Insufficient documentation

## 2022-10-27 LAB — TYPE AND SCREEN
ABO/RH(D): AB POS
Antibody Screen: NEGATIVE
Unit division: 0
Unit division: 0

## 2022-10-27 LAB — BPAM RBC
Blood Product Expiration Date: 202312092359
Blood Product Expiration Date: 202312092359
ISSUE DATE / TIME: 202311161758
Unit Type and Rh: 6200
Unit Type and Rh: 6200

## 2022-10-27 LAB — CBC
HCT: 34.5 % — ABNORMAL LOW (ref 36.0–46.0)
Hemoglobin: 11.1 g/dL — ABNORMAL LOW (ref 12.0–15.0)
MCH: 29.1 pg (ref 26.0–34.0)
MCHC: 32.2 g/dL (ref 30.0–36.0)
MCV: 90.6 fL (ref 80.0–100.0)
Platelets: 114 10*3/uL — ABNORMAL LOW (ref 150–400)
RBC: 3.81 MIL/uL — ABNORMAL LOW (ref 3.87–5.11)
RDW: 18.8 % — ABNORMAL HIGH (ref 11.5–15.5)
WBC: 12.5 10*3/uL — ABNORMAL HIGH (ref 4.0–10.5)
nRBC: 0 % (ref 0.0–0.2)

## 2022-10-27 LAB — COMPREHENSIVE METABOLIC PANEL
ALT: 19 U/L (ref 0–44)
AST: 31 U/L (ref 15–41)
Albumin: 2.6 g/dL — ABNORMAL LOW (ref 3.5–5.0)
Alkaline Phosphatase: 73 U/L (ref 38–126)
Anion gap: 11 (ref 5–15)
BUN: 21 mg/dL (ref 8–23)
CO2: 17 mmol/L — ABNORMAL LOW (ref 22–32)
Calcium: 8.8 mg/dL — ABNORMAL LOW (ref 8.9–10.3)
Chloride: 110 mmol/L (ref 98–111)
Creatinine, Ser: 2.02 mg/dL — ABNORMAL HIGH (ref 0.44–1.00)
GFR, Estimated: 25 mL/min — ABNORMAL LOW (ref >60)
Glucose, Bld: 102 mg/dL — ABNORMAL HIGH (ref 70–99)
Potassium: 4.4 mmol/L (ref 3.5–5.1)
Sodium: 138 mmol/L (ref 135–145)
Total Bilirubin: 1.2 mg/dL (ref 0.3–1.2)
Total Protein: 4.9 g/dL — ABNORMAL LOW (ref 6.5–8.1)

## 2022-10-27 LAB — PREPARE RBC (CROSSMATCH)

## 2022-10-27 LAB — TROPONIN I (HIGH SENSITIVITY)
Troponin I (High Sensitivity): 31 ng/L — ABNORMAL HIGH (ref <18)
Troponin I (High Sensitivity): 28 ng/L — ABNORMAL HIGH (ref <18)

## 2022-10-27 LAB — PROTIME-INR
INR: 2.6 — ABNORMAL HIGH (ref 0.8–1.2)
Prothrombin Time: 27.2 seconds — ABNORMAL HIGH (ref 11.4–15.2)

## 2022-10-27 LAB — APTT: aPTT: 39 seconds — ABNORMAL HIGH (ref 24–36)

## 2022-10-27 LAB — AMMONIA: Ammonia: 17 umol/L (ref 9–35)

## 2022-10-27 MED ORDER — ONDANSETRON 4 MG PO TBDP
4.0000 mg | ORAL_TABLET | Freq: Once | ORAL | Status: AC
  Administered 2022-10-27: 4 mg via ORAL
  Filled 2022-10-27: qty 1

## 2022-10-27 MED ORDER — SODIUM CHLORIDE 0.9 % IV BOLUS
1000.0000 mL | Freq: Once | INTRAVENOUS | Status: AC
  Administered 2022-10-27: 1000 mL via INTRAVENOUS

## 2022-10-27 NOTE — ED Provider Notes (Signed)
[facility] Emergency Department Provider Note MRN:  [id number]  Arrival date & time: 10/28/22     Chief Complaint   Chest Pain   History of Present Illness   Tasha Roberts is a 81 y.o. year-old female with a history of A-fib, Karlene Lineman cirrhosis presenting to the ED with chief complaint of chest pain.  Patient recently discharged from the hospital a few days ago.  Nash cirrhosis, was having some low blood pressures and so was started on midodrine.  Having continued and possibly worsening swelling to the extremities with bruising.  The right upper extremity has been more swollen and tender than the other extremities recently and the skin is weeping.  She is concerned she has a blood clot.  She has been taking her Eliquis.  On the way here, patient also experienced some central chest pain described as a pressure.  Denies shortness of breath, no fever or cough, no abdominal pain.  Review of Systems  A thorough review of systems was obtained and all systems are negative except as noted in the HPI and PMH.   Patient's Health History    Past Medical History:  Diagnosis Date   Acute kidney injury superimposed on chronic kidney disease ([place]) 10/23/2022   Age related osteoporosis    Anemia due to blood loss    iron infusion's last one 03-25-2021 (pt bleeding post vulvar bx 02-11-2021, hospiatal admission 02-21-2021)   Anticoagulant long-term use    eliquis--- managed by pcp    Arthritis    hands   Bronchiectasis [facility])    pulmonology --- dr Halford Chessman   (04-01-2021  pt stated no supplemental oxygen use since 01/ 2022 and no inhaler use,  stated checks O2 at home during the day 98-99%)   Cancer ([place]) 2013   Lumpectomy left breast   Cataract 2015   Chronic constipation    Clotting disorder ([place])    On eliquis   Coronary artery calcification    per lexiscan 08-17-2019 result in care everywhere   Depression, major, single episode, moderate ([place]) 12/05/2021   DOE (dyspnea on  exertion)    04-01-2021  per pt only going up stairs    Gastroesophageal reflux disease 12/05/2021   History of diabetes mellitus, type II    04-01-2021  per pt no issues after weight loss, last taken medication approx 2017   History of Graves' disease 2017   s/p RAI   History of left breast cancer    dx 2013,  s/p left partial mastecotmy w/ node dissection 09-15-2012, low grade DCIS, no chemo, completed radiation 02/ 2014,  per pt no recurrence   History of respiratory syncytial virus (RSV) infection 09/2020   w/ hypoxia  , hospital admission 10/ 2021  and acute exacerbation bronchiectasis;  follow up post hospital pulmonogy dr Halford Chessman  note in epic   Hyperlipidemia    Hypertension    followed by pcp   (pt had lexiscan in care everywhere dated 08-17-2019 no ischemia , non-obstructive extensive calcification, ef 69%)   Hypothyroidism    Hypothyroidism, postradioiodine therapy    followed by pcp   Liver cirrhosis secondary to NASH (nonalcoholic steatohepatitis) ([place])    followed by dr Merrilee Jansky ([facility])---- dx 2004,  compensated ,  last liver bx (03/ 2015) fibrosis stage 3;  moderate portal hypertensive gastrophy   PAF (paroxysmal atrial fibrillation) [facility]) previous cardiologist lov note w/ Suann Larry PA ([facility] in [place]) dated 01-24-2020 in care everywhere;  (  04-01-2021 per pt has appt w/ new cardiologist , dr j. 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 kDelton Coombes  VIN III (vulvar intraepithelial neoplasia III)     Past Surgical History:  Procedure Laterality Date   [place]  BREAST SURGERY  09/15/2012   CATARACT EXTRACTION W/ INTRAOCULAR LENS  IMPLANT, BILATERAL  2016   CHOLECYSTECTOMY OPEN  13614  CO2 LASER APPLICATION N/A 043/15/4008  Procedure: CO2 LASER APPLICATION OF THE VULVA;  Surgeon: REveritt Amber MD;  [place] [facility] [facility]  Service: Gynecology;  Laterality: N/A;   COLONOSCOPY WITH ESOPHAGOGASTRODUODENOSCOPY (EGD)  last one 08/ 2018  @[facility]    EYE SURGERY  2015   Cataract removed both eyes   LUMBAR LAMINECTOMY  2009   PARTIAL MASTECTOMY WITH AXILLARY SENTINEL LYMPH NODE BIOPSY Left 09-15-2012   [place]  TUBAL LIGATION  08/08/1969    Family History  Problem Relation Age of Onset   Hypertension Mother    Dementia Mother    Vision loss Mother    Heart disease Father    Hearing loss Father    Stroke Maternal Grandfather    Heart disease Brother     Social History   Socioeconomic History   Marital status: Widowed    Spouse name: Not on file   Number of children: 3   Years of education: Not on file   Highest education level: Not on file  Occupational History   Occupation: retired  Tobacco Use   Smoking status: Never   Smokeless tobacco: Never  Vaping Use   Vaping Use: Never used  Substance and Sexual Activity   Alcohol use: Not Currently   Drug use: Never   Sexual activity: Not Currently    Birth control/protection: Post-menopausal  Other Topics Concern   Not on file  Social History Narrative   Not on file   Social Determinants of Health   Financial Resource Strain: Low Risk  (08/18/2022)   Overall Financial Resource Strain (CARDIA)    Difficulty of Paying Living Expenses: Not hard at all  Food Insecurity: No Food Insecurity (10/23/2022)   Hunger Vital Sign    Worried About  Running Out of Food in the Last Year: Never true    R[place] the Last Year: Never true  Transportation Needs: No Transportation Needs (10/23/2022)   PRAPARE - THydrologist(Medical): No    Lack of Transportation (Non-Medical): No  Physical Activity: Insufficiently Active (08/18/2022)   Exercise Vital Sign    Days of Exercise per Week: 2 days    Minutes of Exercise per Session: 30 min  Stress: No Stress Concern Present (08/18/2022)   [place]   Feeling of Stress : Not at all  Social Connections:  Moderately Isolated (08/18/2022)   Social Connection and Isolation Panel [NHANES]    Frequency of Communication with Friends and Family: More than three times a week    Frequency of Social Gatherings with Friends and Family: More than three times a week    Attends Religious Services: More than 4 times per year    Active Member of [employer] or Organizations: No    Attends Archivist Meetings: Never    Marital Status: Widowed  Intimate Partner Violence: Not At Risk (10/23/2022)   Humiliation, Afraid, Rape, and Kick questionnaire    Fear of Current or Ex-Partner: No    Emotionally Abused: No    Physically Abused: No    Sexually Abused: No     Physical Exam   Vitals:   10/28/22 0630 10/28/22 0700  BP: (!) 100/41 (!) 100/48  Pulse: 76 83  Resp: 17 14  Temp:  97.8 F (36.6 C)  SpO2: 95% 99%    CONSTITUTIONAL: Chronically ill-appearing, NAD NEURO/PSYCH:  Alert and oriented x 3, no focal deficits EYES:  eyes equal and reactive ENT/NECK:  no LAD, no JVD CARDIO: Regular rate, well-perfused, normal S1 and S2 PULM:  CTAB no wheezing or rhonchi GI/GU:  non-distended, non-tender MSK/SPINE:  No gross deformities SKIN: Diffuse edema and purpura to the extremities   *Additional and/or pertinent findings included in MDM below  Diagnostic and Interventional Summary    EKG  Interpretation  Date/Time:  Monday October 27 2022 18:32:43 EST Ventricular Rate:  97 PR Interval:  150 QRS Duration: 72 QT Interval:  326 QTC Calculation: 414 R Axis:   50 Text Interpretation: Sinus rhythm with Premature supraventricular complexes Low voltage QRS Cannot rule out Anterior infarct , age undetermined Abnormal ECG When compared with ECG of 23-Oct-2022 14:36, PREVIOUS ECG IS PRESENT Confirmed by Gerlene Fee [phone]) on 10/27/2022 11:38:58 PM       Labs Reviewed  CBC - Abnormal; Notable for the following components:      Result Value   WBC 12.5 (*)    RBC 3.81 (*)    Hemoglobin 11.1 (*)    HCT 34.5 (*)    RDW 18.8 (*)    Platelets 114 (*)    All other components within normal limits  COMPREHENSIVE METABOLIC PANEL - Abnormal; Notable for the following components:   CO2 17 (*)    Glucose, Bld 102 (*)    Creatinine, Ser 2.02 (*)    Calcium 8.8 (*)    Total Protein 4.9 (*)    Albumin 2.6 (*)    GFR, Estimated 25 (*)    All other components within normal limits  PROTIME-INR - Abnormal; Notable for the following components:   Prothrombin Time 27.2 (*)    INR 2.6 (*)    All other components within normal limits  APTT - Abnormal; Notable for the following components:   aPTT 39 (*)    All other components within normal limits  TROPONIN I (HIGH SENSITIVITY) - Abnormal; Notable for the following components:   Troponin I (High Sensitivity) 31 (*)    All other components within normal limits  TROPONIN I (HIGH SENSITIVITY) - Abnormal; Notable for the following components:   Troponin I (High Sensitivity) 28 (*)    All other components within normal limits  AMMONIA    CT Chest Wo Contrast  Final Result    DG Chest 2 View  Final Result    VAS [place] UPPER EXTREMITY VENOUS DUPLEX    (Results Pending)  Medications  ondansetron (ZOFRAN-ODT) disintegrating tablet 4 mg (4 mg Oral Given 10/27/22 1856)  sodium chloride 0.9 % bolus 1,000 mL (0 mLs Intravenous Stopped  10/28/22 0353)     Procedures  /  Critical Care Procedures  ED Course and Medical Decision Making  Initial Impression and Ddx Differential diagnosis includes ACS, pneumonia, PE considered but felt to be less likely given her anticoagulated status.  Her pain is resolved, EKG is without concerning changes.  DVT in the right upper extremity is possible, unable to ultrasound at this time of night.  Chest x-ray demonstrating possible infiltrate or new effusion, will obtain Noncon CT of her chest given her GFR.  Past medical/surgical history that increases complexity of ED encounter: Karlene Lineman cirrhosis  Interpretation of Diagnostics I personally reviewed the EKG and my interpretation is as follows: Sinus rhythm  Labs reveal mild acidosis, baseline creatinine of 2.0, minimally elevated troponin that is flat upon repeat.  CT revealing left-sided pleural effusion  Patient Reassessment and Ultimate Disposition/Management     Patient observed overnight in the emergency department, continues to have reassuring vital signs, no chest pain, no increased work of breathing, no fever.  Doubt infected pleural effusion.  She will obtain DVT ultrasound of the right upper extremity.  If reassuring will be appropriate for discharge.  Signed out to oncoming provider at shift change.  Patient management required discussion with the following services or consulting groups:  None  Complexity of Problems Addressed Acute illness or injury that poses threat of life of bodily function  Additional Data Reviewed and Analyzed Further history obtained from: Further history from spouse/family member  Additional Factors Impacting ED Encounter Risk Consideration of hospitalization  Barth Kirks. Sedonia Small, MD [place] [email or web address]  Final Clinical Impressions(s) / ED Diagnoses     ICD-10-CM   1. Chest pain, unspecified type  R07.9     2. Arm swelling  M79.89        ED Discharge Orders     None        Discharge Instructions Discussed with and Provided to Patient:   Discharge Instructions   None      Maudie Flakes, MD 10/28/22 [phone]

## 2022-10-27 NOTE — Telephone Encounter (Signed)
Transition Care Management Follow-up Telephone Call Date of discharge and from where: [facility] 10/25/2022 How have you been since you were released from the hospital? Still having swelling Any questions or concerns? Yes. Patient still swelling  Items Reviewed: Did the pt receive and understand the discharge instructions provided? Yes  Medications obtained and verified? Yes  Other? Yes  Any new allergies since your discharge? No  Dietary orders reviewed? Yes Do you have support at home? Yes   Home Care and Equipment/Supplies: Were home health services ordered? yes If so, what is the name of the agency? [facility]  Has the agency set up a time to come to the patient's home? no Were any new equipment or medical supplies ordered?  No What is the name of the medical supply agency? N/a Were you able to get the supplies/equipment? no Do you have any questions related to the use of the equipment or supplies? No  Functional Questionnaire: (I = Independent and D = Dependent) ADLs: D  Bathing/Dressing- D  Meal Prep- D  Eating- I  Maintaining continence- D  Transferring/Ambulation- D  Managing Meds- I  Follow up appointments reviewed:  PCP Hospital f/u appt confirmed? No  . No available appts until 11/22/2022. Needs appt within a week from 10/25/2022. Will sent message to front desk staff to consult PCP [facility] f/u appt confirmed? No   Are transportation arrangements needed? No  If their condition worsens, is the pt aware to call PCP or go to the Emergency Dept.? Yes Was the patient provided with contact information for the PCP's office or ED? Yes Was to pt encouraged to call back with questions or concerns? Yes  .lh

## 2022-10-27 NOTE — ED Provider Triage Note (Signed)
Emergency Medicine Provider Triage Evaluation Note  Tasha Roberts , a 81 y.o. female  was evaluated in triage.  Pt patient has multiple complaints.  She is complaining of diffuse swelling of bilateral lower and right upper extremities as well as swelling of the abdomen.  Reports episode earlier today lasting approximately 30 minutes to an hour reexperience left-sided chest pain with associated shortness of breath.  Symptoms resolved spontaneously.  She also reports feelings of nausea today and has been unable to take any of her medicines.    Review of Systems  Positive: See above Negative:   Physical Exam  BP (!) 92/59 (BP Location: Left Arm)   Pulse 100   Temp 98 F (36.7 C) (Oral)   Resp 18   Ht 5' 3"  (1.6 m)   Wt 64 kg   SpO2 97%   BMI 24.98 kg/m  Gen:   Awake, no distress   Resp:  Normal effort  MSK:   Moves extremities without difficulty  Other:  Abdominal distention.  No abdominal tenderness.  Bilateral lower extremity edema noted.  Medical Decision Making  Medically screening exam initiated at 6:47 PM.  Appropriate orders placed.  Tasha Roberts was informed that the remainder of the evaluation will be completed by another provider, this initial triage assessment does not replace that evaluation, and the importance of remaining in the ED until their evaluation is complete.     Wilnette Kales, [place] 10/27/22 [phone]

## 2022-10-27 NOTE — ED Triage Notes (Signed)
Patient BIB [facility] from home after being discharged from the hospital for an admission for hypotension, patient here for evaluation of right arm swelling  and left sided chest pain that resolved spontaneously prior to EMS arriving to patient's home. Patient is alert, oriented, and in no apparent distress at this time.

## 2022-10-27 NOTE — ED Notes (Signed)
Patient transported to CT 

## 2022-10-28 ENCOUNTER — Other Ambulatory Visit (HOSPITAL_COMMUNITY): Payer: Medicare Other

## 2022-10-28 ENCOUNTER — Ambulatory Visit: Payer: Medicare Other | Admitting: Urology

## 2022-10-28 ENCOUNTER — Ambulatory Visit (HOSPITAL_COMMUNITY): Payer: Medicare Other

## 2022-10-28 ENCOUNTER — Encounter (HOSPITAL_COMMUNITY): Payer: Medicare Other

## 2022-10-28 ENCOUNTER — Emergency Department (HOSPITAL_BASED_OUTPATIENT_CLINIC_OR_DEPARTMENT_OTHER): Payer: Medicare Other

## 2022-10-28 DIAGNOSIS — L538 Other specified erythematous conditions: Secondary | ICD-10-CM

## 2022-10-28 DIAGNOSIS — M7989 Other specified soft tissue disorders: Secondary | ICD-10-CM | POA: Diagnosis not present

## 2022-10-28 DIAGNOSIS — R079 Chest pain, unspecified: Secondary | ICD-10-CM | POA: Diagnosis not present

## 2022-10-28 MED ORDER — DOXYCYCLINE HYCLATE 100 MG PO TABS: 100.0000 mg | ORAL_TABLET | Freq: Once | ORAL | Status: DC

## 2022-10-28 MED ORDER — DOXYCYCLINE HYCLATE 100 MG PO CAPS: 100.0000 mg | ORAL_CAPSULE | Freq: Two times a day (BID) | ORAL | 0 refills | Status: DC

## 2022-10-28 NOTE — ED Provider Notes (Signed)
Pt signed out by Dr. Sedonia Small pending [place] RUE.  No DVT.  Arm is very red, so I will treat for cellulitis with doxy.  Pt is stable for d/c.  Return if worse.   Isla Pence, MD 10/28/22 1035

## 2022-10-28 NOTE — Progress Notes (Signed)
Upper extremity venous right study completed.  Preliminary results relayed to Gilford Raid, MD,  See CV Proc for preliminary results report.   Darlin Coco, RDMS, RVT

## 2022-11-04 ENCOUNTER — Inpatient Hospital Stay: Payer: Medicare Other

## 2022-11-04 ENCOUNTER — Inpatient Hospital Stay: Payer: Medicare Other | Admitting: Physician Assistant

## 2022-11-05 ENCOUNTER — Telehealth: Payer: Self-pay | Admitting: Internal Medicine

## 2022-11-05 NOTE — Telephone Encounter (Signed)
Lurline Hare, [phone]   Wants verbal orders for [facility] aide to help with ADL's. States pt is weak &needs assistance.

## 2022-11-05 NOTE — Telephone Encounter (Signed)
Verbal order given to tina

## 2022-11-06 ENCOUNTER — Other Ambulatory Visit: Payer: Self-pay | Admitting: Internal Medicine

## 2022-11-06 ENCOUNTER — Ambulatory Visit: Payer: Medicare Other

## 2022-11-06 DIAGNOSIS — K7460 Unspecified cirrhosis of liver: Secondary | ICD-10-CM

## 2022-11-10 ENCOUNTER — Inpatient Hospital Stay: Payer: Medicare Other

## 2022-11-14 ENCOUNTER — Emergency Department (HOSPITAL_COMMUNITY): Payer: Medicare Other

## 2022-11-14 ENCOUNTER — Encounter (HOSPITAL_COMMUNITY): Payer: Self-pay | Admitting: *Deleted

## 2022-11-14 ENCOUNTER — Emergency Department (HOSPITAL_COMMUNITY)
Admission: EM | Admit: 2022-11-14 | Discharge: 2022-11-14 | Disposition: A | Discharge status: Home / Self Care | Payer: Medicare Other | Attending: Emergency Medicine | Admitting: Emergency Medicine

## 2022-11-14 ENCOUNTER — Other Ambulatory Visit: Payer: Self-pay

## 2022-11-14 ENCOUNTER — Inpatient Hospital Stay: Payer: Medicare Other

## 2022-11-14 DIAGNOSIS — Z7901 Long term (current) use of anticoagulants: Secondary | ICD-10-CM | POA: Diagnosis not present

## 2022-11-14 DIAGNOSIS — J90 Pleural effusion, not elsewhere classified: Secondary | ICD-10-CM

## 2022-11-14 DIAGNOSIS — K7460 Unspecified cirrhosis of liver: Secondary | ICD-10-CM

## 2022-11-14 DIAGNOSIS — R0602 Shortness of breath: Secondary | ICD-10-CM | POA: Diagnosis present

## 2022-11-14 DIAGNOSIS — R140 Abdominal distension (gaseous): Secondary | ICD-10-CM | POA: Diagnosis not present

## 2022-11-14 DIAGNOSIS — R188 Other ascites: Secondary | ICD-10-CM | POA: Diagnosis not present

## 2022-11-14 LAB — CBC WITH DIFFERENTIAL/PLATELET
Abs Immature Granulocytes: 0.01 10*3/uL (ref 0.00–0.07)
Basophils Absolute: 0 10*3/uL (ref 0.0–0.1)
Basophils Relative: 1 %
Eosinophils Absolute: 0.1 10*3/uL (ref 0.0–0.5)
Eosinophils Relative: 2 %
HCT: 35.6 % — ABNORMAL LOW (ref 36.0–46.0)
Hemoglobin: 11.6 g/dL — ABNORMAL LOW (ref 12.0–15.0)
Immature Granulocytes: 0 %
Lymphocytes Relative: 28 %
Lymphs Abs: 1.5 10*3/uL (ref 0.7–4.0)
MCH: 29.9 pg (ref 26.0–34.0)
MCHC: 32.6 g/dL (ref 30.0–36.0)
MCV: 91.8 fL (ref 80.0–100.0)
Monocytes Absolute: 0.5 10*3/uL (ref 0.1–1.0)
Monocytes Relative: 10 %
Neutro Abs: 3.1 10*3/uL (ref 1.7–7.7)
Neutrophils Relative %: 59 %
Platelets: 165 10*3/uL (ref 150–400)
RBC: 3.88 MIL/uL (ref 3.87–5.11)
RDW: 20.2 % — ABNORMAL HIGH (ref 11.5–15.5)
WBC: 5.3 10*3/uL (ref 4.0–10.5)
nRBC: 0 % (ref 0.0–0.2)

## 2022-11-14 LAB — COMPREHENSIVE METABOLIC PANEL
ALT: 29 U/L (ref 0–44)
AST: 57 U/L — ABNORMAL HIGH (ref 15–41)
Albumin: 2.8 g/dL — ABNORMAL LOW (ref 3.5–5.0)
Alkaline Phosphatase: 79 U/L (ref 38–126)
Anion gap: 9 (ref 5–15)
BUN: 18 mg/dL (ref 8–23)
CO2: 23 mmol/L (ref 22–32)
Calcium: 8.6 mg/dL — ABNORMAL LOW (ref 8.9–10.3)
Chloride: 108 mmol/L (ref 98–111)
Creatinine, Ser: 1.32 mg/dL — ABNORMAL HIGH (ref 0.44–1.00)
GFR, Estimated: 41 mL/min — ABNORMAL LOW (ref >60)
Glucose, Bld: 93 mg/dL (ref 70–99)
Potassium: 3.6 mmol/L (ref 3.5–5.1)
Sodium: 140 mmol/L (ref 135–145)
Total Bilirubin: 1.5 mg/dL — ABNORMAL HIGH (ref 0.3–1.2)
Total Protein: 5.3 g/dL — ABNORMAL LOW (ref 6.5–8.1)

## 2022-11-14 LAB — AMMONIA: Ammonia: 16 umol/L (ref 9–35)

## 2022-11-14 MED ORDER — IOHEXOL 300 MG/ML  SOLN
80.0000 mL | Freq: Once | INTRAMUSCULAR | Status: AC | PRN
  Administered 2022-11-14: 80 mL via INTRAVENOUS

## 2022-11-14 MED ORDER — ALBUTEROL SULFATE HFA 108 (90 BASE) MCG/ACT IN AERS
2.0000 | INHALATION_SPRAY | RESPIRATORY_TRACT | Status: DC | PRN
  Filled 2022-11-14: qty 6.7

## 2022-11-14 NOTE — Discharge Instructions (Signed)
Contact Dr. Posey Pronto for consideration for outpatient paracentesis.  This can be done by the radiology department here.  No emergent indications for to be done today or this weekend.  Return for any new or worse symptoms.

## 2022-11-14 NOTE — ED Notes (Signed)
Pt complained that she called out a while before shift change that she needed to be changed. This tech offered multiple times to let me change pt before going home and Pt refused.

## 2022-11-14 NOTE — ED Triage Notes (Signed)
Pt brought in by [facility] for c/o sob; pt was found by ems to have O2 sats initially in the 80's; pt was placed on NRB and sats increased to 99%. Ems placed pt on [place] 4l and pt was able to maintain her sats at 99%  Pt states she was vomiting and thinks she may have aspirated; pt c/o sob with exertion

## 2022-11-14 NOTE — ED Provider Notes (Signed)
[facility] EMERGENCY DEPARTMENT Provider Note   CSN: [id number] Arrival date & time: 11/14/22  1134     History  Chief Complaint  Patient presents with   Shortness of Breath    Tasha Roberts is a 81 y.o. female.  Patient brought in by EMS from home initial concern seem to be shortness of breath low oxygen sats but oxygen sats here were in the upper 90s and remained stable.  Discussion with patient's daughter main concern is persistent vomiting and diarrhea but this been going on for a long time followed by GI at [facility] and they are not sure the cause.  Patient is also had several admissions here for that.  Patient main concern was abdominal distention.  They had a telephone consult with their gastroenterologist who recommended being evaluated for concerns for needing a emergent paracentesis.  Patient but is not having any new abdominal pain.  Patient chart review diagnosis seen frequently.  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  Does not have local GI.  Does have a local primary care doctor.  Patient's had chronic vomiting and diarrhea and nobody has been able to figure that out.  Past medical history otherwise significant for hypothyroidism hyperlipidemia paroxysmal atrial fibrillation patient is on the blood thinner Eliquis.  Patient also has history of diabetes and hypertension.  2 emergency departments either here or at any point in emergency.  Patient seen October 29 for colitis in July patient was seen on November 8 for vomiting and diarrhea.  Patient had an admission at that time patient seen November 16 for shortness of breath and hypertension.  Patient had admission to the independent ICU at that time.  Patient seen November 20 for chest pain has been unspecified.  Patient was discharged home at that time.  Denies any chest pain denies any shortness of breath at rest.  Oxygen sats were in the upper 90s.   Abdomen is distended.  Does sound exam with movement at times that she will get short of breath but her oxygen levels do not really drop.  Not exactly sure what EMS got that these low oxygen sats initially.  But patient sats here is been fine.          Home Medications Prior to Admission medications   Medication Sig Start Date End Date Taking? Authorizing Provider  apixaban (ELIQUIS) 2.5 MG TABS tablet Take 1 tablet (2.5 mg total) by mouth 2 (two) times daily. 10/20/22  Yes Shahmehdi, Seyed A, MD  atorvastatin (LIPITOR) 20 MG tablet TAKE (1) TABLET BY MOUTH ONCE DAILY. Patient taking differently: Take 20 mg by mouth daily. 11/07/22  Yes Lindell Spar, MD  Cholecalciferol 50 MCG (2000 UT) CAPS Take 1 capsule (2,000 Units total) by mouth daily. 03/30/22  Yes Kc, Maren Beach, MD  diltiazem (CARDIZEM CD) 120 MG 24 hr capsule TAKE (1) CAPSULE BY MOUTH ONCE DAILY. Patient taking differently: Take 120 mg by mouth daily. 05/16/22  Yes BranchAlphonse Guild, MD  levothyroxine (SYNTHROID) 100 MCG tablet Take 1 tablet (100 mcg total) by mouth daily before breakfast. 06/09/22  Yes Lindell Spar, MD  midodrine (PROAMATINE) 5 MG tablet Take 1 tablet (5 mg total) by mouth 3 (three) times daily with meals. 10/25/22  Yes Emokpae, Courage, MD  Nystatin (GERHARDT'S BUTT CREAM) CREA Apply 1 Application topically 3 (three) times daily. 10/20/22 11/19/22 Yes Shahmehdi, Seyed A, MD  ondansetron (ZOFRAN-ODT) 4 MG disintegrating tablet  Take 1 tablet (4 mg total) by mouth every 8 (eight) hours as needed for nausea or vomiting. 10/20/22  Yes Shahmehdi, Seyed A, MD  tolterodine (DETROL LA) 4 MG 24 hr capsule Take 1 capsule (4 mg total) by mouth daily. 08/28/22  Yes McKenzie, Candee Furbish, MD  vitamin B-12 (CYANOCOBALAMIN) 500 MCG tablet Take 500 mcg by mouth daily.   Yes [provider]  doxycycline (VIBRAMYCIN) 100 MG capsule Take 1 capsule (100 mg total) by mouth 2 (two) times daily. Patient not taking: Reported on  11/14/2022 10/28/22   Isla Pence, MD  feeding supplement (ENSURE ENLIVE / ENSURE PLUS) LIQD Take 237 mLs by mouth 2 (two) times daily between meals. Patient not taking: Reported on 11/14/2022 07/28/22   Heath Lark D, DO  fidaxomicin (DIFICID) 200 MG TABS tablet Take 1 tablet (200 mg total) by mouth 2 (two) times daily for 10 days Patient not taking: Reported on 11/14/2022 10/25/22   Roxan Hockey, MD      Allergies    Dicyclomine, Glucophage [metformin], and Losartan    Review of Systems   Review of Systems  Constitutional:  Negative for chills and fever.  HENT:  Negative for ear pain and sore throat.   Eyes:  Negative for pain and visual disturbance.  Respiratory:  Negative for cough and shortness of breath.   Cardiovascular:  Negative for chest pain and palpitations.  Gastrointestinal:  Positive for abdominal distention, diarrhea and vomiting. Negative for abdominal pain and blood in stool.  Genitourinary:  Negative for dysuria and hematuria.  Musculoskeletal:  Negative for arthralgias and back pain.  Skin:  Negative for color change and rash.  Neurological:  Negative for seizures and syncope.  All other systems reviewed and are negative.   Physical Exam Updated Vital Signs BP (!) 117/56   Pulse 77   Temp 97.6 F (36.4 C) (Oral)   Resp 14   Ht 1.6 m (_0 )   Wt 63.9 kg   SpO2 99%   BMI 24.95 kg/m  Physical Exam Vitals and nursing note reviewed.  Constitutional:      General: She is not in acute distress.    Appearance: She is well-developed.  HENT:     Head: Normocephalic and atraumatic.  Eyes:     Conjunctiva/sclera: Conjunctivae normal.  Cardiovascular:     Rate and Rhythm: Normal rate and regular rhythm.     Heart sounds: No murmur heard. Pulmonary:     Effort: Pulmonary effort is normal. No respiratory distress.     Breath sounds: Normal breath sounds. No decreased breath sounds, wheezing, rhonchi or rales.  Chest:     Chest wall: No tenderness.   Abdominal:     Palpations: Abdomen is soft.     Tenderness: There is no abdominal tenderness. There is no guarding.     Comments: Abdomen distended but not super firm completely nontender.  Suggestive of ascites fluid.  Musculoskeletal:        General: No swelling.     Cervical back: Neck supple.     Right lower leg: No edema.     Left lower leg: No edema.  Skin:    General: Skin is warm and dry.     Capillary Refill: Capillary refill takes less than 2 seconds.  Neurological:     Mental Status: She is alert.  Psychiatric:        Mood and Affect: Mood normal.     ED Results / Procedures / Treatments  Labs (all labs ordered are listed, but only abnormal results are displayed) Labs Reviewed  COMPREHENSIVE METABOLIC PANEL - Abnormal; Notable for the following components:      Result Value   Creatinine, Ser 1.32 (*)    Calcium 8.6 (*)    Total Protein 5.3 (*)    Albumin 2.8 (*)    AST 57 (*)    Total Bilirubin 1.5 (*)    GFR, Estimated 41 (*)    All other components within normal limits  CBC WITH DIFFERENTIAL/PLATELET - Abnormal; Notable for the following components:   Hemoglobin 11.6 (*)    HCT 35.6 (*)    RDW 20.2 (*)    All other components within normal limits  AMMONIA    EKG EKG Interpretation  Date/Time:  Friday November 14 2022 11:47:24 EST Ventricular Rate:  72 PR Interval:    QRS Duration: 93 QT Interval:  415 QTC Calculation: 439 R Axis:   41 Text Interpretation: Atrial fibrillation Low voltage, precordial leads Abnormal R-wave progression, early transition Borderline T abnormalities, diffuse leads Baseline wander in lead(s) V3 Confirmed by Fredia Sorrow [phone]) on 11/14/2022 12:48:11 PM  Radiology CT Abdomen Pelvis W Contrast  Result Date: 11/14/2022 CLINICAL DATA:  Abdominal pain and distention EXAM: CT ABDOMEN AND PELVIS WITH CONTRAST TECHNIQUE: Multidetector CT imaging of the abdomen and pelvis was performed using the standard protocol following  bolus administration of intravenous contrast. RADIATION DOSE REDUCTION: This exam was performed according to the departmental dose-optimization program which includes automated exposure control, adjustment of the mA and/or kV according to patient size and/or use of iterative reconstruction technique. CONTRAST:  56m OMNIPAQUE IOHEXOL 300 MG/ML  SOLN COMPARISON:  Previous studies including the CT done on 1Dec 13, 2023FINDINGS: Lower chest: There is moderate left pleural effusion Coronary artery calcifications are seen. Dense calcification is seen in mitral annulus. Hepatobiliary: There is fatty infiltration in liver. There is nodularity in liver surface. There is inhomogeneous enhancement in liver. There is no dilation of intrahepatic bile ducts. Distal common bile duct in the head of the pancreas measures 10 mm. Surgical clips are seen in gallbladder fossa. Pancreas: No focal abnormalities are seen. Spleen: Spleen is not enlarged. There are scattered calcified granulomas. Adrenals/Urinary Tract: Adrenals are unremarkable. There is no hydronephrosis. There are foci of cortical thinning in left kidney. There are few small calcifications in left kidney each measuring less than 3 mm in size. There is 1 mm calcific density in the upper pole of right kidney. Few scattered calcifications are seen in the renal artery branches. There is a 2 mm calcific density inseparable from the distal left ureter close to the ureterovesical junction which has not changed. Urinary bladder is not distended. Stomach/Bowel: Small hiatal hernia is seen. Stomach is not distended. There is no significant small bowel dilation. There is mild wall thickening and jejunum. Appendix is not seen. There is no significant wall thickening in colon. Scattered diverticula are seen without signs of focal diverticulitis. Vascular/Lymphatic: Arterial calcifications are seen in aorta and its major branches. Reproductive: Uterus is not seen. There is 1.9 cm  structure in the right side of pelvis, possibly right ovary. Gonadal vein appears to be extending from this structure to the inferior vena cava. Other: Moderate to large ascites is present. There is no pneumoperitoneum. There is diffuse anasarca in subcutaneous plane with interval worsening. Musculoskeletal: Marked compression fracture in body L2 vertebra has not changed. Degenerative changes are noted in lumbar spine with encroachment of neural foramina at multiple  levels, more so at the L4-L5 level. IMPRESSION: There is no evidence of intestinal obstruction or pneumoperitoneum. There is no hydronephrosis. Cirrhosis of liver. Moderate to large ascites. Moderate left pleural effusion. Diffuse edema is seen in subcutaneous plane in the abdominal wall suggesting anasarca. Coronary artery calcifications are seen. Aortic atherosclerosis. Small hiatal hernia. Small left renal stones. Possible tiny 1-2 mm calculus in the distal left ureter near the ureterovesical junction without significant obstruction. Compression fracture in body of L2 vertebra has not changed in comparison with the immediate previous examination of 10/24/2022. Lumbar spondylosis. Electronically Signed   By: Elmer Picker M.D.   On: 11/14/2022 16:24   DG Chest 2 View  Result Date: 11/14/2022 CLINICAL DATA:  Oxygen desaturation, vomiting, question aspiration, shortness of breath with exertion, past history of GERD, LEFT breast cancer post lumpectomy and radiation therapy, bronchiectasis, type II diabetes mellitus, cirrhosis due to NASH, hypertension EXAM: CHEST - 2 VIEW COMPARISON:  10/27/2022 FINDINGS: Normal heart size, mediastinal contours, and pulmonary vascularity. LEFT pleural effusion and basilar atelectasis again identified. Underlying emphysematous changes. No new infiltrate or pneumothorax. Bones demineralized. IMPRESSION: Emphysematous changes with persistent LEFT pleural effusion and basilar atelectasis. Emphysema (ICD10-J43.9).  Electronically Signed   By: Lavonia Dana M.D.   On: 11/14/2022 13:00    Procedures Procedures    Medications Ordered in ED Medications  albuterol (VENTOLIN HFA) 108 (90 Base) MCG/ACT inhaler 2 puff (has no administration in time range)  iohexol (OMNIPAQUE) 300 MG/ML solution 80 mL (80 mLs Intravenous Contrast Given 11/14/22 1549)    ED Course/ Medical Decision Making/ A&P                           Medical Decision Making Amount and/or Complexity of Data Reviewed Labs: ordered. Radiology: ordered.  Risk Prescription drug management.   Patient vital sign wise very stable here oxygen sats remain in the upper 90s.  No hypoxia at all.  Patient's ammonia level at 16 patient never had an elevated ammonia level.  Patient's complete metabolic panel renal function GFR is 41 which is an improvement from where was just couple weeks ago.  Sodium and electrolytes good LFTs bilirubin 1.5 alk phos normal.  AST 57 ALT 29.  CBC no leukocytosis hemoglobin 11.6.  CT scan abdomen was done without any acute findings other than it does show moderate to large ascites.  Did show a moderate left pleural effusion which we also saw on chest x-ray.  Some evidence of a ureteral stone small left renal stones possible tiny 1 to 2 mm calculus in the distal left ureter near the UVJ junction.  But patient not having any symptoms from this no signs of obstruction.  And her chest x-ray emphysema changes with persistent left pleural effusion.  Long conversation with patient's daughter.  Patient may eventually need outpatient paracentesis.  We do not have that available to do for her now her being on blood thinners there could be some planning that would need to be done before was done.  Certainly her electrolytes here and renal function here and labs all much better than they have been.  So no indication for admission based on that.  Recommend to have her [facility] gastroenterologist contact her primary care doctor and outpatient  paracentesis could be done here.  But there is no indication for emergent paracentesis.  No evidence of hypoxia no any evidence of respiratory distress.   Final Clinical Impression(s) / ED Diagnoses Final  diagnoses:  Other ascites  Abdominal distention  Cirrhosis, nonalcoholic ([place])  Pleural effusion    Rx / DC Orders ED Discharge Orders     None         Fredia Sorrow, MD 11/14/22 1825

## 2022-11-17 ENCOUNTER — Telehealth: Payer: Self-pay

## 2022-11-17 ENCOUNTER — Inpatient Hospital Stay: Payer: Medicare Other

## 2022-11-17 NOTE — Telephone Encounter (Signed)
Transition Care Management Follow-up Telephone Call Date of discharge and from where: Tasha Roberts 11/14/2022 How have you been since you were released from the hospital? Still has a lot of fluid build up Any questions or concerns? Yes Fluid retension Items Reviewed: Did the pt receive and understand the discharge instructions provided? Yes  Medications obtained and verified? Yes  Other? No  Any new allergies since your discharge? No  Dietary orders reviewed? Yes Do you have support at home? Yes   Home Care and Equipment/Supplies: Were home health services ordered? no If so, what is the name of the agency? N/a  Has the agency set up a time to come to the patient's home? no Were any new equipment or medical supplies ordered?  No What is the name of the medical supply agency? N/a Were you able to get the supplies/equipment? no Do you have any questions related to the use of the equipment or supplies? no  Functional Questionnaire: (I = Independent and D = Dependent) ADLs: I  Bathing/Dressing- I  Meal Prep- I  Eating- I  Maintaining continence- I  Transferring/Ambulation- I  Managing Meds- I  Follow up appointments reviewed:  PCP Hospital f/u appt confirmed? Yes  Scheduled to see Dr Posey Pronto on 11/18/2022 @ 8:40. [facility] f/u appt confirmed? No  Are transportation arrangements needed? No  If their condition worsens, is the pt aware to call PCP or go to the Emergency Dept.? Yes Was the patient provided with contact information for the PCP's office or ED? Yes Was to pt encouraged to call back with questions or concerns? Yes  Juanda Crumble, LPN [place] Direct Dial [phone]

## 2022-11-18 ENCOUNTER — Telehealth: Payer: Self-pay | Admitting: Gastroenterology

## 2022-11-18 ENCOUNTER — Ambulatory Visit (INDEPENDENT_AMBULATORY_CARE_PROVIDER_SITE_OTHER): Payer: Medicare Other | Admitting: Internal Medicine

## 2022-11-18 ENCOUNTER — Encounter: Payer: Self-pay | Admitting: Gastroenterology

## 2022-11-18 ENCOUNTER — Encounter: Payer: Self-pay | Admitting: Internal Medicine

## 2022-11-18 DIAGNOSIS — R5381 Other malaise: Secondary | ICD-10-CM | POA: Diagnosis not present

## 2022-11-18 DIAGNOSIS — R188 Other ascites: Secondary | ICD-10-CM | POA: Diagnosis not present

## 2022-11-18 DIAGNOSIS — K7581 Nonalcoholic steatohepatitis (NASH): Secondary | ICD-10-CM | POA: Diagnosis not present

## 2022-11-18 DIAGNOSIS — S32020S Wedge compression fracture of second lumbar vertebra, sequela: Secondary | ICD-10-CM

## 2022-11-18 DIAGNOSIS — E440 Moderate protein-calorie malnutrition: Secondary | ICD-10-CM | POA: Diagnosis not present

## 2022-11-18 DIAGNOSIS — S12590D Other displaced fracture of sixth cervical vertebra, subsequent encounter for fracture with routine healing: Secondary | ICD-10-CM

## 2022-11-18 DIAGNOSIS — K7460 Unspecified cirrhosis of liver: Secondary | ICD-10-CM

## 2022-11-18 MED ORDER — MISC. DEVICES MISC: 0 refills | Status: DC

## 2022-11-18 NOTE — Patient Instructions (Signed)
You are being referred to GI.  You are being scheduled to get paracentesis - abdominal fluid removal.

## 2022-11-18 NOTE — Assessment & Plan Note (Signed)
Getting home PT Had back brace Pain meds PRN F/u with Dr Arnoldo Morale Has difficulty getting up from chair or from lying position, lift chair could be beneficial for her to avoid fall

## 2022-11-18 NOTE — Assessment & Plan Note (Signed)
Has weakness in b/l LE Recently had compression fracture of lumbar vertebra, limiting her ability to ambulate and stand Has difficulty getting up from chair or from lying position, lift chair could be beneficial for her to avoid fall

## 2022-11-18 NOTE — Telephone Encounter (Signed)
Got an urgent referral from Dr. Posey Pronto and I called the daughter to schedule and she was wanting to know if this visit could be virtual?  She said it was hard to get her mother in and out, she wasn't mobile.  I scheduled her for 12/14 with you.

## 2022-11-18 NOTE — Progress Notes (Signed)
Virtual Visit via Telephone Note   This visit type was conducted via telephone. This format is felt to be most appropriate for this patient at this time.  The patient did not have access to video technology/had technical difficulties with video requiring transitioning to audio format only (telephone).  All issues noted in this document were discussed and addressed.  No physical exam could be performed with this format.  Evaluation Performed:  Follow-up visit  Date:  11/18/2022   ID:  Tasha Roberts, DOB 02/21/1941, MRN [id number]  Patient Location: Home Provider Location: Office/Clinic  Participants: Patient and her daughter - [name] Location of Patient: Home Location of Provider: Telehealth Consent was obtain for visit to be over via telehealth. I verified that I am speaking with the correct person using two identifiers.  PCP:  Lindell Spar, MD   Chief Complaint: Follow up of ER visit for ascites  History of Present Illness:    Tasha Roberts is a 81 y.o. female who has a televisit for follow-up of recent ER visit for ascites from hepatic cirrhosis.  She was sent to ER for paracentesis by her liver specialist at [facility]  In the ER, she had a CT abdomen done, which showed moderate to large ascites, but could not have IR paracentesis due to lack of staff.  She has been having severe abdominal distention and dyspnea.  She has been gaining about 5 lbs in a week according to her daughter.  She is currently not on any diuretic, has had hypotensive episodes in the past.  She has been having difficulty getting up from her bed.  Her daughter is having difficulty providing support to get her up from lying or sitting position.  She currently has home health, but has been having difficulty participating in PT.  The patient does not have symptoms concerning for COVID-19 infection (fever, chills, cough, or new shortness of breath).   Past Medical, Surgical, Social History, Allergies, and  Medications have been Reviewed.  Past Medical History:  Diagnosis Date   Acute kidney injury superimposed on chronic kidney disease ([place]) 10/23/2022   Age related osteoporosis    Anemia due to blood loss    iron infusion's last one 03-25-2021 (pt bleeding post vulvar bx 02-11-2021, hospiatal admission 02-21-2021)   Anticoagulant long-term use    eliquis--- managed by pcp    Arthritis    hands   Bronchiectasis [facility])    pulmonology --- dr Halford Chessman   (04-01-2021  pt stated no supplemental oxygen use since 01/ 2022 and no inhaler use,  stated checks O2 at home during the day 98-99%)   Cancer ([place]) 2013   Lumpectomy left breast   Cataract 2015   Chronic constipation    Clotting disorder ([place])    On eliquis   Coronary artery calcification    per lexiscan 08-17-2019 result in care everywhere   Depression, major, single episode, moderate ([place]) 12/05/2021   DOE (dyspnea on exertion)    04-01-2021  per pt only going up stairs    Gastroesophageal reflux disease 12/05/2021   History of diabetes mellitus, type II    04-01-2021  per pt no issues after weight loss, last taken medication approx 2017   History of Graves' disease 2017   s/p RAI   History of left breast cancer    dx 2013,  s/p left partial mastecotmy w/ node dissection 09-15-2012, low grade DCIS, no chemo, completed radiation 02/ 2014,  per pt no recurrence  History of respiratory syncytial virus (RSV) infection 09/2020   w/ hypoxia  , hospital admission 10/ 2021  and acute exacerbation bronchiectasis;  follow up post hospital pulmonogy dr Halford Chessman  note in epic   Hyperlipidemia    Hypertension    followed by pcp   (pt had lexiscan in care everywhere dated 08-17-2019 no ischemia , non-obstructive extensive calcification, ef 69%)   Hypothyroidism    Hypothyroidism, postradioiodine therapy    followed by pcp   Liver cirrhosis secondary to NASH (nonalcoholic steatohepatitis) ([place])    followed by dr Merrilee Jansky ([facility])---- dx 2004,  compensated ,  last liver bx (03/ 2015) fibrosis stage 3;  moderate portal hypertensive gastrophy   PAF (paroxysmal atrial fibrillation) ([place]) previous cardiologist lov note w/ Suann Larry PA ([facility] in [place]) dated 01-24-2020 in care everywhere;  (04-01-2021 per pt has appt w/ new cardiologist , dr j. branch 04-30-2021)   first dx 2020--  had event monitor/ stress test/ echo all results in care everywhere (monitor 08-03-2019 short runs AFib conversion pause <2 secconds, rate controlled;  echo 12-14-2019 ef 60%, mild concentric LVH, G2DD, RSVP 50.66mHg)   Personal history of radiation therapy 2013   left breast cancer,  completed 02/ 2014   PONV (postoperative nausea and vomiting)    Sarcoidosis, lung ([facility] pulmonology--- dr sHalford Chessman  04-01-2021  per pt dx age 89s, no issue since   [place] to UTI [place] 03/27/2022   Thrombocytopenia ([facility]    hematology/ oncology--- dr s. kDelton Coombes  VIN III (vulvar intraepithelial neoplasia III)    Past Surgical History:  [place]cedure Laterality Date   [place]  BREAST SURGERY  09/15/2012   CATARACT EXTRACTION W/ INTRAOCULAR LENS  IMPLANT, BILATERAL  2016   CHOLECYSTECTOMY OPEN  14917  CO2 LASER APPLICATION N/A [id number]  Procedure: CO2 LASER APPLICATION OF THE VULVA;  Surgeon: REveritt Amber MD;  [facility]  Service: Gynecology;  Laterality: N/A;   COLONOSCOPY WITH ESOPHAGOGASTRODUODENOSCOPY (EGD)  last one 08/ 2018  @[facility]    EYE SURGERY  2015   Cataract removed both eyes   LUMBAR LAMINECTOMY  2009   PARTIAL MASTECTOMY WITH AXILLARY SENTINEL LYMPH NODE BIOPSY [facility]  TUBAL LIG[facility]  08/08/1969     No outpatient medications have been marked as taking for the 11/18/22 encounter (Office Visit) with PLindell Spar MD.     Allergies:   Dicyclomine, Glucophage [metformin], and Losartan    ROS:   Please see the history of present illness.     All other systems reviewed and are negative.   Labs/Other Tests and Data Reviewed:    Recent Labs: 07/25/2022: TSH 4.145 10/23/2022: B Natriuretic Peptide 328.0; Magnesium 1.8 11/14/2022: ALT 29; BUN 18; Creatinine, Ser 1.32; Hemoglobin 11.6; Platelets 165; Potassium 3.6; Sodium 140   Recent Lipid Panel No results found for: "CHOL", "TRIG", "HDL", "CHOLHDL", "LDLCALC", "LDLDIRECT"  Wt Readings from Last 3 Encounters:  11/14/22 140 lb 14 oz (63.9 kg)  10/27/22 141 lb (64 kg)  10/25/22 141 lb 5 oz (64.1 kg)     ASSESSMENT & PLAN:    Liver cirrhosis secondary to NASH ([place] [facility] Has moderate to large ascites - needs paracentesis Ordered UKoreaparacentesis with fluid studies Referred to local GI [facility]  Closed displaced fracture of  sixth cervical vertebra with routine healing Had cervical collar in place in the last office visit Denies severe neck pain currently F/u with Dr Arnoldo Morale  Compression fracture of L2 [facility]) Getting home PT Had back brace Pain meds PRN F/u with Dr Arnoldo Morale Has difficulty getting up from chair or from lying position, lift chair could be beneficial for her to avoid fall  Physical deconditioning Has weakness in b/l LE Recently had compression fracture of lumbar vertebra, limiting her ability to ambulate and stand Has difficulty getting up from chair or from lying position, lift chair could be beneficial for her to avoid fall  Moderate protein-calorie malnutrition ([place]) Likely due to poor PO intake due to severe nausea and abdominal distention due to cirrhosis; lack of dentures, needs dental evaluation Advised to take Ensure supplement for now   Time:   Today, I have spent 25 minutes reviewing the chart, including problem list, medications, and with the patient with telehealth technology discussing the above problems.   Medication Adjustments/Labs and  Tests Ordered: Current medicines are reviewed at length with the patient today.  Concerns regarding medicines are outlined above.   Tests Ordered: No orders of the defined types were placed in this encounter.   Medication Changes: No orders of the defined types were placed in this encounter.    Note: This dictation was prepared with Dragon dictation along with smaller phrase technology. Similar sounding words can be transcribed inadequately or may not be corrected upon review. Any transcriptional errors that result from this process are unintentional.      Disposition:  Follow up  Signed, Lindell Spar, MD  11/18/2022 8:38 AM     [facility]

## 2022-11-18 NOTE — Assessment & Plan Note (Signed)
Followed by liver clinic at [facility] Has moderate to large ascites - needs paracentesis Ordered US paracentesis with fluid studies Referred to local GI [facility]

## 2022-11-18 NOTE — Assessment & Plan Note (Signed)
Had cervical collar in place in the last office visit Denies severe neck pain currently F/u with Dr Arnoldo Morale

## 2022-11-18 NOTE — Assessment & Plan Note (Signed)
Likely due to poor PO intake due to severe nausea and abdominal distention due to cirrhosis; lack of dentures, needs dental evaluation Advised to take Ensure supplement for now

## 2022-11-19 ENCOUNTER — Telehealth: Payer: Self-pay | Admitting: Internal Medicine

## 2022-11-19 ENCOUNTER — Other Ambulatory Visit: Payer: Self-pay

## 2022-11-19 DIAGNOSIS — S12590D Other displaced fracture of sixth cervical vertebra, subsequent encounter for fracture with routine healing: Secondary | ICD-10-CM

## 2022-11-19 DIAGNOSIS — R188 Other ascites: Secondary | ICD-10-CM

## 2022-11-19 DIAGNOSIS — E440 Moderate protein-calorie malnutrition: Secondary | ICD-10-CM

## 2022-11-19 DIAGNOSIS — S32020S Wedge compression fracture of second lumbar vertebra, sequela: Secondary | ICD-10-CM

## 2022-11-19 DIAGNOSIS — R5381 Other malaise: Secondary | ICD-10-CM

## 2022-11-19 MED ORDER — MISC. DEVICES MISC: 0 refills | Status: DC

## 2022-11-19 NOTE — Telephone Encounter (Signed)
Called in regard to Lift chair order. Chair not covered by insurance w. Original placement.   Orders can be sent in to [facility]  Or [facility] in [place] .

## 2022-11-19 NOTE — Telephone Encounter (Signed)
Faxed to [facility]

## 2022-11-20 ENCOUNTER — Ambulatory Visit: Payer: Medicare Other | Admitting: Gastroenterology

## 2022-11-21 ENCOUNTER — Ambulatory Visit: Payer: Medicare Other

## 2022-11-24 ENCOUNTER — Inpatient Hospital Stay: Payer: Medicare Other | Admitting: Physician Assistant

## 2022-11-24 ENCOUNTER — Ambulatory Visit: Payer: Medicare Other

## 2022-11-24 ENCOUNTER — Inpatient Hospital Stay: Payer: Medicare Other

## 2022-11-26 ENCOUNTER — Encounter (HOSPITAL_COMMUNITY): Payer: Self-pay

## 2022-11-26 ENCOUNTER — Ambulatory Visit (HOSPITAL_COMMUNITY)
Admission: RE | Admit: 2022-11-26 | Discharge: 2022-11-26 | Disposition: A | Discharge status: Home / Self Care | Payer: Medicare Other | Source: Ambulatory Visit | Attending: Internal Medicine | Admitting: Internal Medicine

## 2022-11-26 DIAGNOSIS — R188 Other ascites: Secondary | ICD-10-CM | POA: Diagnosis present

## 2022-11-26 DIAGNOSIS — K7581 Nonalcoholic steatohepatitis (NASH): Secondary | ICD-10-CM | POA: Insufficient documentation

## 2022-11-26 DIAGNOSIS — K7460 Unspecified cirrhosis of liver: Secondary | ICD-10-CM | POA: Diagnosis present

## 2022-11-26 LAB — BODY FLUID CELL COUNT WITH DIFFERENTIAL
Eos, Fluid: 0 %
Lymphs, Fluid: 23 %
Monocyte-Macrophage-Serous Fluid: 74 % (ref 50–90)
Neutrophil Count, Fluid: 3 % (ref 0–25)
Other Cells, Fluid: 1 %
Total Nucleated Cell Count, Fluid: 70 cu mm (ref 0–1000)

## 2022-11-26 LAB — LACTATE DEHYDROGENASE, PLEURAL OR PERITONEAL FLUID: LD, Fluid: <25 U/L — ABNORMAL HIGH (ref 3–23)

## 2022-11-26 LAB — GRAM STAIN

## 2022-11-26 LAB — PROTEIN, PLEURAL OR PERITONEAL FLUID: Total protein, fluid: <3 g/dL

## 2022-11-26 NOTE — Progress Notes (Signed)
Paracentesis complete no signs of distress. 1300 ml of ascites removed.

## 2022-11-26 NOTE — Procedures (Signed)
PreOperative Dx: Cirrhosis due to NASH, ascites Postoperative Dx: Cirrhosis due to NASH, ascites Procedure:   US guided paracentesis Radiologist:  Thornton Papas Anesthesia:  10 ml of1% lidocaine Specimen:  1.3 L of clear light yellow ascitic fluid EBL:   < 1 ml Complications:  None

## 2022-11-28 LAB — PATHOLOGIST SMEAR REVIEW

## 2022-12-01 LAB — CULTURE, BODY FLUID W GRAM STAIN -BOTTLE
Culture: NO GROWTH
Special Requests: ADEQUATE

## 2022-12-02 ENCOUNTER — Telehealth: Payer: Self-pay | Admitting: Internal Medicine

## 2022-12-02 NOTE — Telephone Encounter (Signed)
Tina w. [facility] [phone])   Called in on patient behalf Patient R lower leg (knee down) is oozing fluids. Patient has to change about 4-5 times dailey.   Wants verbal orders to wrap leg  Gauze   Ace wraps

## 2022-12-02 NOTE — Telephone Encounter (Signed)
Tasha Roberts with [facility] advised

## 2022-12-05 ENCOUNTER — Other Ambulatory Visit: Payer: Self-pay | Admitting: Internal Medicine

## 2022-12-05 DIAGNOSIS — R110 Nausea: Secondary | ICD-10-CM

## 2022-12-05 MED ORDER — ONDANSETRON 4 MG PO TBDP: 4.0000 mg | ORAL_TABLET | Freq: Three times a day (TID) | ORAL | 0 refills | Status: DC | PRN

## 2022-12-12 ENCOUNTER — Inpatient Hospital Stay: Payer: Medicare Other | Attending: Hematology

## 2022-12-12 VITALS — BP 86/48 | HR 84 | Temp 97.7°F | Resp 18

## 2022-12-12 DIAGNOSIS — Z79899 Other long term (current) drug therapy: Secondary | ICD-10-CM | POA: Diagnosis not present

## 2022-12-12 DIAGNOSIS — Z853 Personal history of malignant neoplasm of breast: Secondary | ICD-10-CM | POA: Diagnosis present

## 2022-12-12 DIAGNOSIS — D696 Thrombocytopenia, unspecified: Secondary | ICD-10-CM | POA: Diagnosis not present

## 2022-12-12 DIAGNOSIS — D500 Iron deficiency anemia secondary to blood loss (chronic): Secondary | ICD-10-CM

## 2022-12-12 DIAGNOSIS — D509 Iron deficiency anemia, unspecified: Secondary | ICD-10-CM | POA: Insufficient documentation

## 2022-12-12 DIAGNOSIS — E538 Deficiency of other specified B group vitamins: Secondary | ICD-10-CM | POA: Insufficient documentation

## 2022-12-12 MED ORDER — SODIUM CHLORIDE 0.9 % IV SOLN: Freq: Once | INTRAVENOUS | Status: AC

## 2022-12-12 MED ORDER — LORATADINE 10 MG PO TABS
10.0000 mg | ORAL_TABLET | Freq: Once | ORAL | Status: AC
  Administered 2022-12-12: 10 mg via ORAL
  Filled 2022-12-12: qty 1

## 2022-12-12 MED ORDER — SODIUM CHLORIDE 0.9 % IV SOLN
300.0000 mg | Freq: Once | INTRAVENOUS | Status: AC
  Administered 2022-12-12: 300 mg via INTRAVENOUS
  Filled 2022-12-12: qty 300

## 2022-12-12 NOTE — Patient Instructions (Signed)
[facility]  Discharge Instructions: Thank you for choosing [facility] to provide your oncology and hematology care.  If you have a lab appointment with the Cancer Center, please come in thru the Main Entrance and check in at the main information desk.  Wear comfortable clothing and clothing appropriate for easy access to any Portacath or PICC line.   We strive to give you quality time with your provider. You may need to reschedule your appointment if you arrive late (15 or more minutes).  Arriving late affects you and other patients whose appointments are after yours.  Also, if you miss three or more appointments without notifying the office, you may be dismissed from the clinic at the provider's discretion.      For prescription refill requests, have your pharmacy contact our office and allow 72 hours for refills to be completed.    Iron Sucrose Injection What is this medication? IRON SUCROSE (EYE ern SOO krose) treats low levels of iron (iron deficiency anemia) in people with kidney disease. Iron is a mineral that plays an important role in making red blood cells, which carry oxygen from your lungs to the rest of your body. This medicine may be used for other purposes; ask your health care provider or pharmacist if you have questions. COMMON BRAND NAME(S): Venofer What should I tell my care team before I take this medication? They need to know if you have any of these conditions: Anemia not caused by low iron levels Heart disease High levels of iron in the blood Kidney disease Liver disease An unusual or allergic reaction to iron, other medications, foods, dyes, or preservatives Pregnant or trying to get pregnant Breastfeeding How should I use this medication? This medication is for infusion into a vein. It is given in a hospital or clinic setting. Talk to your care team about the use of this medication in children. While this medication may be  prescribed for children as young as 2 years for selected conditions, precautions do apply. Overdosage: If you think you have taken too much of this medicine contact a poison control center or emergency room at once. NOTE: This medicine is only for you. Do not share this medicine with others. What if I miss a dose? Keep appointments for follow-up doses. It is important not to miss your dose. Call your care team if you are unable to keep an appointment. What may interact with this medication? Do not take this medication with any of the following: Deferoxamine Dimercaprol Other iron products This medication may also interact with the following: Chloramphenicol Deferasirox This list may not describe all possible interactions. Give your health care provider a list of all the medicines, herbs, non-prescription drugs, or dietary supplements you use. Also tell them if you smoke, drink alcohol, or use illegal drugs. Some items may interact with your medicine. What should I watch for while using this medication? Visit your care team regularly. Tell your care team if your symptoms do not start to get better or if they get worse. You may need blood work done while you are taking this medication. You may need to follow a special diet. Talk to your care team. Foods that contain iron include: whole grains/cereals, dried fruits, beans, or peas, leafy green vegetables, and organ meats (liver, kidney). What side effects may I notice from receiving this medication? Side effects that you should report to your care team as soon as possible: Allergic reactions--skin rash, itching, hives,   swelling of the face, lips, tongue, or throat Low blood pressure--dizziness, feeling faint or lightheaded, blurry vision Shortness of breath Side effects that usually do not require medical attention (report to your care team if they continue or are bothersome): Flushing Headache Joint pain Muscle pain Nausea Pain, redness, or  irritation at injection site This list may not describe all possible side effects. Call your doctor for medical advice about side effects. You may report side effects to FDA at 1-800-FDA-1088. Where should I keep my medication? This medication is given in a hospital or clinic and will not be stored at home. NOTE: This sheet is a summary. It may not cover all possible information. If you have questions about this medicine, talk to your doctor, pharmacist, or health care provider.  2023 Elsevier/Gold Standard (2021-03-07 00:00:00)    To help prevent nausea and vomiting after your treatment, we encourage you to take your nausea medication as directed.  BELOW ARE SYMPTOMS THAT SHOULD BE REPORTED IMMEDIATELY: *FEVER GREATER THAN 100.4 F (38 C) OR HIGHER *CHILLS OR SWEATING *NAUSEA AND VOMITING THAT IS NOT CONTROLLED WITH YOUR NAUSEA MEDICATION *UNUSUAL SHORTNESS OF BREATH *UNUSUAL BRUISING OR BLEEDING *URINARY PROBLEMS (pain or burning when urinating, or frequent urination) *BOWEL PROBLEMS (unusual diarrhea, constipation, pain near the anus) TENDERNESS IN MOUTH AND THROAT WITH OR WITHOUT PRESENCE OF ULCERS (sore throat, sores in mouth, or a toothache) UNUSUAL RASH, SWELLING OR PAIN  UNUSUAL VAGINAL DISCHARGE OR ITCHING   Items with * indicate a potential emergency and should be followed up as soon as possible or go to the Emergency Department if any problems should occur.  Please show the CHEMOTHERAPY ALERT CARD or IMMUNOTHERAPY ALERT CARD at check-in to the Emergency Department and triage nurse.  Should you have questions after your visit or need to cancel or reschedule your appointment, please contact [facility] [phone]  and follow the prompts.  Office hours are 8:00 a.m. to 4:30 p.m. Monday - Friday. Please note that voicemails left after 4:00 p.m. may not be returned until the following business day.  We are closed weekends and major holidays. You have access to  a nurse at all times for urgent questions. Please call the main number to the clinic [phone] and follow the prompts.  For any non-urgent questions, you may also contact your provider using MyChart. We now offer e-Visits for anyone 18 and older to request care online for non-urgent symptoms. For details visit [email or web address].   Also download the MyChart app! Go to the app store, search "MyChart", open the app, select [facility], and log in with your MyChart username and password.   

## 2022-12-12 NOTE — Progress Notes (Signed)
Patient presents today for Venofer infusion.  Patient is in satisfactory condition with no complaints voiced.  Vital signs are stable.  We will proceed with treatment per provider orders.  Patient tolerated treatment well with no complaints voiced.  Patient left via wheelchair in stable condition.  Vital signs stable at discharge.  Follow up as scheduled.

## 2022-12-19 ENCOUNTER — Inpatient Hospital Stay: Payer: Medicare Other

## 2022-12-19 ENCOUNTER — Other Ambulatory Visit: Payer: Self-pay | Admitting: Physician Assistant

## 2022-12-19 DIAGNOSIS — I878 Other specified disorders of veins: Secondary | ICD-10-CM

## 2022-12-19 NOTE — Progress Notes (Signed)
Unable to receive IV iron today due to extremely poor peripheral IV access.  There is high probability that she will continue to need iron infusions and possible blood transfusions in the future.  Referral placed for IR port placement, patient agrees.  We will also schedule her for a diagnostic mammogram ASAP.  Screening mammogram several months ago showed abnormalities in both breasts, but patient canceled follow-up imaging due to other health issues interfering.  She is scheduled for follow-up visit with me in 3 weeks.

## 2022-12-19 NOTE — Progress Notes (Signed)
Patient presented for iron infusion today. Multiple attempts to gain IV access which were unsuccessful. Tarri Abernethy PA-C notified.   Will hold off on the iron infusion today and others scheduled. Patient will follow up as scheduled

## 2022-12-23 ENCOUNTER — Telehealth: Payer: Medicare Other | Admitting: Medical

## 2022-12-23 NOTE — Progress Notes (Deleted)
Cardiology Office Note:    Date:  12/23/2022   ID:  Tasha Roberts, DOB 06/26/1941, MRN [id number]  PCP:  Lindell Spar, MD  [facility] Cardiologist:  Carlyle Dolly, MD  [facility] Electrophysiologist:  None   Referring MD: Lindell Spar, MD   Chief Complaint: 3 month follow-up   {Choose 1 Note Type (Video or Telephone):[phone]}    Date:  12/23/2022   ID:  Tasha Roberts, DOB [birth year], MRN [id number] The patient was identified using 2 identifiers.  {Patient Location:[phone]::"Home"} {Provider Location:[phone]::"Home Office"}   PCP:  Lindell Spar, MD   [facility] Cardiologist:  Carlyle Dolly, MD { Click to update primary MD,subspecialty MD or APP then REFRESH:1}    Evaluation Performed:  {Choose Visit Type:[id number]::"Follow-Up Visit"}  Chief Complaint:  3 month follow-up  History of Present Illness:    Tasha Roberts is a 82 y.o. 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   Patient was previously followed at [facility] cardiology.  Patient has a history of A-fib on Eliquis.  She has a long history of anemia and thrombocytopenia due to liver disease.  She saw Dr. Burt Knack June 2023 for watchman consideration, she was deemed a good candidate.  Patient was admitted for a fall and AMS in August 2023.  Chronic anemia noted during admission, hemoglobin down to 6.8 and transferred to units.  Eliquis was stopped.  She was seen October 2023 Eliquis 5 mg twice daily was restarted.  Echo in November 2023 showed LVEF 60 to 65%, normal RV function, mild calcification of the aortic valve.  Today,   Past Medical History:  Diagnosis Date   Acute kidney injury superimposed on chronic kidney disease ([place]) 10/23/2022   Age related osteoporosis    Anemia due  to blood loss    iron infusion's last one 03-25-2021 (pt bleeding post vulvar bx 02-11-2021, hospiatal admission 02-21-2021)   Anticoagulant long-term use    eliquis--- managed by pcp    Arthritis    hands   Bronchiectasis [facility])    pulmonology --- dr Halford Chessman   (04-01-2021  pt stated no supplemental oxygen use since 01/ 2022 and no inhaler use,  stated checks O2 at home during the day 98-99%)   Cancer ([place]) 2013   Lumpectomy left breast   Cataract 2015   Chronic constipation    Clotting disorder ([place])    On eliquis   Coronary artery calcification    per lexiscan 08-17-2019 result in care everywhere   Depression, major, single episode, moderate ([place]) 12/05/2021   DOE (dyspnea on exertion)    04-01-2021  per pt only going up stairs    Gastroesophageal reflux disease 12/05/2021   History of diabetes mellitus, type II    04-01-2021  per pt no issues after weight loss, last taken medication approx 2017   History of Graves' disease 2017   s/p RAI   History of left breast cancer    dx 2013,  s/p left partial mastecotmy w/ node dissection 09-15-2012, low grade DCIS, no chemo, completed radiation 02/ 2014,  per pt no recurrence   History of respiratory syncytial virus (RSV) infection 09/2020   w/ hypoxia  , hospital admission 10/ 2021  and acute exacerbation bronchiectasis;  follow up post hospital pulmonogy dr Halford Chessman  note in epic   Hyperlipidemia  Hypertension    followed by pcp   (pt had lexiscan in care everywhere dated 08-17-2019 no ischemia , non-obstructive extensive calcification, ef 69%)   Hypothyroidism    Hypothyroidism, postradioiodine therapy    followed by pcp   Liver cirrhosis secondary to NASH (nonalcoholic steatohepatitis) ([place])    followed by dr Merrilee Jansky ([facility])---- dx 2004,  compensated ,  last liver bx (03/ 2015) fibrosis stage 3;  moderate portal hypertensive gastrophy   PAF (paroxysmal atrial fibrillation) ([place]) previous cardiologist lov note w/ Suann Larry PA ([facility] in [place]) dated 01-24-2020 in care everywhere;  (04-01-2021 per pt has appt w/ new cardiologist , dr j. branch 04-30-2021)   first dx 2020--  had event monitor/ stress test/ echo all results in care everywhere (monitor 08-03-2019 short runs AFib conversion pause <2 secconds, rate controlled;  echo 12-14-2019 ef 60%, mild concentric LVH, G2DD, RSVP 50.69mHg)   Personal history of radiation therapy 2013   left breast cancer,  completed 02/ 2014   PONV (postoperative nausea and vomiting)    Sarcoidosis, lung ([facility] pulmonology--- dr sHalford Chessman  04-01-2021  per pt dx age [age]s, no issue since   [place] to [place] 03/27/2022   Thrombocytopenia ([facility]    hematology/ oncology--- dr s. kDelton Coombes  VIN III (vulvar intraepithelial neoplasia III)    Past Surgical History:  Procedure Laterality Date   [place]  BREAST SURGERY  09/15/2012   CATARACT EXTRACTION W/ INTRAOCULAR LENS  IMPLANT, BILATERAL  2016   CHOLECYSTECTOMY OPEN  [id number]  CO2 LASER APPLICATION N/A [id number]  Procedure: CO2 LASER APPLICATION OF THE VULVA;  Surgeon: REveritt Amber MD;  [place]  Service: Gynecology;  Laterality: N/A;   COLONOSCOPY WITH ESOPHAGOGASTRODUODENOSCOPY (EGD)  last one 08/ 2018  @[facility]    EYE SURGERY  2015   Cataract removed both eyes   LUMBAR LAMINECTOMY  2009   PARTIAL MASTECTOMY WITH AXILLARY SENTINEL LYMPH NODE BIOPSY [facility]  TUBAL LIGATION  08/08/1969     No outpatient medications have been marked as taking for the 12/23/22 encounter (Appointment) with FKathlen Mody Manolo Bosket H, PA-C.     Allergies:   Dicyclomine, Glucophage [metformin], and Losartan   Social History   Tobacco Use   Smoking status: Never   Smokeless tobacco: Never  Vaping Use   Vaping Use: Never used  Substance Use Topics   Alcohol use: Not Currently   Drug use: Never      Family Hx: The patient's family history includes Dementia in her mother; Hearing loss in her father; Heart disease in her brother and father; Hypertension in her mother; Stroke in her maternal grandfather; Vision loss in her mother.  ROS:   Please see the history of present illness.    *** All other systems reviewed and are negative.   Prior CV studies:   The following studies were reviewed today:  ***  Labs/Other Tests and Data Reviewed:    EKG:  {EKG/Telemetry Strips Reviewed:[phone]}  Recent Labs: 07/25/2022: TSH 4.145 10/23/2022: B Natriuretic Peptide 328.0; Magnesium 1.8 11/14/2022: ALT 29; BUN 18; Creatinine, Ser 1.32; Hemoglobin 11.6; Platelets 165; Potassium 3.6; Sodium 140   Recent Lipid Panel No results found for: "CHOL", "TRIG", "HDL", "CHOLHDL", "LDLCALC", "LDLDIRECT"  Wt Readings from Last 3 Encounters:  11/14/22 140 lb 14 oz (63.9 kg)  10/27/22  141 lb (64 kg)  10/25/22 141 lb 5 oz (64.1 kg)     Risk Assessment/Calculations:   {Does this patient have ATRIAL FIBRILLATION?[phone]}      Objective:    Vital Signs:  There were no vitals taken for this visit.   {HeartCare Virtual Exam (Optional):[phone]::"VITAL SIGNS:  reviewed"}  ASSESSMENT & PLAN:    ***      {Are you ordering a CV Procedure (e.g. stress test, cath, DCCV, TEE, etc)?   Press F2        :[id number]     Time:   Today, I have spent *** minutes with the patient with telehealth technology discussing the above problems.     Medication Adjustments/Labs and Tests Ordered: Current medicines are reviewed at length with the patient today.  Concerns regarding medicines are outlined above.   Tests Ordered: No orders of the defined types were placed in this encounter.   Medication Changes: No orders of the defined types were placed in this encounter.   Follow Up:  {F/U Format:[phone]} {follow up:15908}  Signed, Mignon Bechler Ninfa Meeker, PA-C  12/23/2022 10:40 AM    [place]  [facility]

## 2022-12-24 ENCOUNTER — Encounter (HOSPITAL_COMMUNITY): Payer: Self-pay | Admitting: *Deleted

## 2022-12-24 ENCOUNTER — Other Ambulatory Visit: Payer: Self-pay | Admitting: Radiology

## 2022-12-24 ENCOUNTER — Other Ambulatory Visit: Payer: Self-pay | Admitting: Student

## 2022-12-25 ENCOUNTER — Ambulatory Visit (HOSPITAL_COMMUNITY)
Admission: RE | Admit: 2022-12-25 | Discharge: 2022-12-25 | Disposition: A | Discharge status: Home / Self Care | Payer: Medicare Other | Source: Ambulatory Visit | Attending: Physician Assistant | Admitting: Physician Assistant

## 2022-12-25 DIAGNOSIS — E0500 Thyrotoxicosis with diffuse goiter without thyrotoxic crisis or storm: Secondary | ICD-10-CM | POA: Diagnosis not present

## 2022-12-25 DIAGNOSIS — K7460 Unspecified cirrhosis of liver: Secondary | ICD-10-CM | POA: Diagnosis not present

## 2022-12-25 DIAGNOSIS — I129 Hypertensive chronic kidney disease with stage 1 through stage 4 chronic kidney disease, or unspecified chronic kidney disease: Secondary | ICD-10-CM | POA: Insufficient documentation

## 2022-12-25 DIAGNOSIS — I878 Other specified disorders of veins: Secondary | ICD-10-CM | POA: Insufficient documentation

## 2022-12-25 DIAGNOSIS — E1122 Type 2 diabetes mellitus with diabetic chronic kidney disease: Secondary | ICD-10-CM | POA: Diagnosis not present

## 2022-12-25 DIAGNOSIS — Z7901 Long term (current) use of anticoagulants: Secondary | ICD-10-CM | POA: Diagnosis not present

## 2022-12-25 DIAGNOSIS — I480 Paroxysmal atrial fibrillation: Secondary | ICD-10-CM | POA: Diagnosis not present

## 2022-12-25 DIAGNOSIS — N179 Acute kidney failure, unspecified: Secondary | ICD-10-CM | POA: Diagnosis not present

## 2022-12-25 DIAGNOSIS — N189 Chronic kidney disease, unspecified: Secondary | ICD-10-CM | POA: Diagnosis not present

## 2022-12-25 DIAGNOSIS — Z853 Personal history of malignant neoplasm of breast: Secondary | ICD-10-CM | POA: Insufficient documentation

## 2022-12-25 DIAGNOSIS — Z8249 Family history of ischemic heart disease and other diseases of the circulatory system: Secondary | ICD-10-CM | POA: Insufficient documentation

## 2022-12-25 DIAGNOSIS — Z780 Asymptomatic menopausal state: Secondary | ICD-10-CM | POA: Insufficient documentation

## 2022-12-25 HISTORY — PX: IR IMAGING GUIDED PORT INSERTION: IMG5740

## 2022-12-25 MED ORDER — ONDANSETRON HCL 4 MG/2ML IJ SOLN
INTRAMUSCULAR | Status: AC | PRN
  Administered 2022-12-25: 4 mg via INTRAVENOUS

## 2022-12-25 MED ORDER — MIDAZOLAM HCL 2 MG/2ML IJ SOLN
INTRAMUSCULAR | Status: AC
  Filled 2022-12-25: qty 2

## 2022-12-25 MED ORDER — FENTANYL CITRATE (PF) 100 MCG/2ML IJ SOLN
INTRAMUSCULAR | Status: AC
  Filled 2022-12-25: qty 2

## 2022-12-25 MED ORDER — ONDANSETRON HCL 4 MG/2ML IJ SOLN
INTRAMUSCULAR | Status: AC
  Filled 2022-12-25: qty 2

## 2022-12-25 MED ORDER — MIDAZOLAM HCL 2 MG/2ML IJ SOLN
INTRAMUSCULAR | Status: AC | PRN
  Administered 2022-12-25: 1 mg via INTRAVENOUS

## 2022-12-25 MED ORDER — HEPARIN SOD (PORK) LOCK FLUSH 100 UNIT/ML IV SOLN
INTRAVENOUS | Status: AC | PRN
  Administered 2022-12-25: 500 [IU] via INTRAVENOUS

## 2022-12-25 MED ORDER — SODIUM CHLORIDE 0.9 % IV SOLN: INTRAVENOUS | Status: DC

## 2022-12-25 MED ORDER — FENTANYL CITRATE (PF) 100 MCG/2ML IJ SOLN
INTRAMUSCULAR | Status: AC | PRN
  Administered 2022-12-25: 25 ug via INTRAVENOUS

## 2022-12-25 MED ORDER — HEPARIN SOD (PORK) LOCK FLUSH 100 UNIT/ML IV SOLN
INTRAVENOUS | Status: AC
  Filled 2022-12-25: qty 5

## 2022-12-25 MED ORDER — LIDOCAINE HCL 1 % IJ SOLN
INTRAMUSCULAR | Status: AC
  Administered 2022-12-25: 15 mL
  Filled 2022-12-25: qty 20

## 2022-12-25 NOTE — Procedures (Signed)
Interventional Radiology Procedure Note  Procedure: Single Lumen Power Port Placement    Access:  Right IJ vein.  Findings: Catheter tip positioned at SVC/RA junction. Port is ready for immediate use.   Complications: None  EBL: < 10 mL  Recommendations:  - Ok to shower in 24 hours - Do not submerge for 7 days - Routine line care   Damacio Weisgerber T. Deion Forgue, M.D Pager:  [phone]   

## 2022-12-25 NOTE — H&P (Signed)
Chief Complaint: Patient was seen in consultation today for poor venous access; port-a-catheter placement.   Referring Physician(s): Harriett Rush  Supervising Physician: Dr. Kathlene Cote   Patient Status: [facility] - Out-pt  History of Present Illness: Tasha Roberts is a 82 y.o. female with a medical history significant for left breast cancer s/p lumpectomy in 2013, chronic kidney disease, DM2, Grave's disease, HTN, cirrhosis, paroxysmal atrial fibrillation and clotting disorder on Eliquis and anemia. She requires frequent IV iron infusions, lab draws and occasional blood transfusions. She has very poor peripheral IV access and was unable to receive her iron transfusion last week.   Interventional Radiology has been asked to evaluate this patient for an image-guided port-a-catheter placement.   Past Medical History:  Diagnosis Date   Acute kidney injury superimposed on chronic kidney disease ([place]) 10/23/2022   Age related osteoporosis    Anemia due to blood loss    iron infusion's last one 03-25-2021 (pt bleeding post vulvar bx 02-11-2021, hospiatal admission 02-21-2021)   Anticoagulant long-term use    eliquis--- managed by pcp    Arthritis    hands   Bronchiectasis [facility])    pulmonology --- dr Halford Chessman   (04-01-2021  pt stated no supplemental oxygen use since 01/ 2022 and no inhaler use,  stated checks O2 at home during the day 98-99%)   Cancer ([place]) 2013   Lumpectomy left breast   Cataract 2015   Chronic constipation    Clotting disorder ([place])    On eliquis   Coronary artery calcification    per lexiscan 08-17-2019 result in care everywhere   Depression, major, single episode, moderate ([place]) 12/05/2021   DM II (diabetes mellitus, type II), controlled ([place]) 08/27/2005   DOE (dyspnea on exertion)    04-01-2021  per pt only going up stairs    Gastroesophageal reflux disease 12/05/2021   History of diabetes mellitus, type II    04-01-2021  per pt no issues after weight loss,  last taken medication approx 2017   History of Graves' disease 2017   s/p RAI   History of left breast cancer    dx 2013,  s/p left partial mastecotmy w/ node dissection 09-15-2012, low grade DCIS, no chemo, completed radiation 02/ 2014,  per pt no recurrence   History of respiratory syncytial virus (RSV) infection 09/2020   w/ hypoxia  , hospital admission 10/ 2021  and acute exacerbation bronchiectasis;  follow up post hospital pulmonogy dr Halford Chessman  note in epic   Hyperlipidemia    Hypertension    followed by pcp   (pt had lexiscan in care everywhere dated 08-17-2019 no ischemia , non-obstructive extensive calcification, ef 69%)   Hypothyroidism    Hypothyroidism, postradioiodine therapy    followed by pcp   Liver cirrhosis secondary to NASH (nonalcoholic steatohepatitis) ([place])    followed by dr Merrilee Jansky ([facility])---- dx 2004,  compensated ,  last liver bx (03/ 2015) fibrosis stage 3;  moderate portal hypertensive gastrophy   PAF (paroxysmal atrial fibrillation) ([place]) previous cardiologist lov note w/ Suann Larry PA ([facility] in [place]) dated 01-24-2020 in care everywhere;  (04-01-2021 per pt has appt w/ new cardiologist , dr j. branch 04-30-2021)   first dx 2020--  had event monitor/ stress test/ echo all results in care everywhere (monitor 08-03-2019 short runs AFib conversion pause <2 secconds, rate controlled;  echo 12-14-2019 ef 60%, mild concentric LVH, G2DD, RSVP 50.14mHg)   Personal history of radiation therapy 2013  left breast cancer,  completed 02/ 2014   PONV (postoperative nausea and vomiting)    Sarcoidosis, lung [facility]) pulmonology--- dr Halford Chessman   04-01-2021  per pt dx age 31s , no issue since   Sepsis secondary to UTI ([place]) 03/27/2022   Thrombocytopenia [facility])    hematology/ oncology--- dr s. Delton Coombes   VIN III (vulvar intraepithelial neoplasia III)     Past Surgical History:  Procedure Laterality Date   [place]   BREAST SURGERY  09/15/2012   CATARACT EXTRACTION W/ INTRAOCULAR LENS  IMPLANT, BILATERAL  2016   CHOLECYSTECTOMY OPEN  6761   CO2 LASER APPLICATION N/A 95/08/3266   Procedure: CO2 LASER APPLICATION OF THE VULVA;  Surgeon: Everitt Amber, MD;  Location: [place];  Service: Gynecology;  Laterality: N/A;   COLONOSCOPY WITH ESOPHAGOGASTRODUODENOSCOPY (EGD)  last one 08/ 2018  '@[facility]    EYE SURGERY  2015   Cataract removed both eyes   LUMBAR LAMINECTOMY  2009   PARTIAL MASTECTOMY WITH AXILLARY SENTINEL LYMPH NODE BIOPSY Left 09-15-2012   [facility]   TUBAL LIGATION  08/08/1969    Allergies: Dicyclomine, Glucophage [metformin], Midodrine hcl, Midol [acetaminophen], and Losartan  Medications: Prior to Admission medications   Medication Sig Start Date End Date Taking? Authorizing Provider  apixaban (ELIQUIS) 2.5 MG TABS tablet Take 1 tablet (2.5 mg total) by mouth 2 (two) times daily. 10/20/22   Shahmehdi, Valeria Batman, MD  atorvastatin (LIPITOR) 20 MG tablet TAKE (1) TABLET BY MOUTH ONCE DAILY. Patient taking differently: Take 20 mg by mouth daily. 11/07/22   Lindell Spar, MD  Cholecalciferol 50 MCG (2000 UT) CAPS Take 1 capsule (2,000 Units total) by mouth daily. 03/30/22   Antonieta Pert, MD  diltiazem (CARDIZEM CD) 120 MG 24 hr capsule TAKE (1) CAPSULE BY MOUTH ONCE DAILY. Patient taking differently: Take 120 mg by mouth daily. 05/16/22   Arnoldo Lenis, MD  feeding supplement (ENSURE ENLIVE / ENSURE PLUS) LIQD Take 237 mLs by mouth 2 (two) times daily between meals. 07/28/22   Manuella Ghazi, Pratik D, DO  fidaxomicin (DIFICID) 200 MG TABS tablet Take 1 tablet (200 mg total) by mouth 2 (two) times daily for 10 days 10/25/22   Roxan Hockey, MD  levothyroxine (SYNTHROID) 100 MCG tablet Take 1 tablet (100 mcg total) by mouth daily before breakfast. 06/09/22   Lindell Spar, MD  midodrine (PROAMATINE) 5 MG tablet Take 1 tablet (5 mg total) by  mouth 3 (three) times daily with meals. 10/25/22   Roxan Hockey, MD  Misc. Devices [place] Lift chair - 1. 11/19/22   Lindell Spar, MD  ondansetron (ZOFRAN-ODT) 4 MG disintegrating tablet Take 1 tablet (4 mg total) by mouth every 8 (eight) hours as needed for nausea or vomiting. 12/05/22   Lindell Spar, MD  tolterodine (DETROL LA) 4 MG 24 hr capsule Take 1 capsule (4 mg total) by mouth daily. 08/28/22   McKenzie, Candee Furbish, MD  vitamin B-12 (CYANOCOBALAMIN) 500 MCG tablet Take 500 mcg by mouth daily.    [provider]     Family History  Problem Relation Age of Onset   Hypertension Mother    Dementia Mother    Vision loss Mother    Heart disease Father    Hearing loss Father    Stroke Maternal Grandfather    Heart disease Brother     Social History   Socioeconomic History  Marital status: Widowed    Spouse name: Not on file   Number of children: 3   Years of education: Not on file   Highest education level: Not on file  Occupational History   Occupation: retired  Tobacco Use   Smoking status: Never   Smokeless tobacco: Never  Vaping Use   Vaping Use: Never used  Substance and Sexual Activity   Alcohol use: Not Currently   Drug use: Never   Sexual activity: Not Currently    Birth control/protection: Post-menopausal  Other Topics Concern   Not on file  Social History Narrative   Not on file   Social Determinants of Health   Financial Resource Strain: Low Risk  (08/18/2022)   Overall Financial Resource Strain (CARDIA)    Difficulty of Paying Living Expenses: Not hard at all  Food Insecurity: No Food Insecurity (10/23/2022)   Hunger Vital Sign    Worried About Running Out of Food in the Last Year: Never true    [place] in the Last Year: Never true  Transportation Needs: No Transportation Needs (10/23/2022)   PRAPARE - Hydrologist (Medical): No    Lack of Transportation (Non-Medical): No  Physical Activity:  Insufficiently Active (08/18/2022)   Exercise Vital Sign    Days of Exercise per Week: 2 days    Minutes of Exercise per Session: 30 min  Stress: No Stress Concern Present (08/18/2022)   [place]    Feeling of Stress : Not at all  Social Connections: Moderately Isolated (08/18/2022)   Social Connection and Isolation Panel [NHANES]    Frequency of Communication with Friends and Family: More than three times a week    Frequency of Social Gatherings with Friends and Family: More than three times a week    Attends Religious Services: More than 4 times per year    Active Member of [employer] or Organizations: No    Attends Archivist Meetings: Never    Marital Status: Widowed    Review of Systems: A 12 point ROS discussed and pertinent positives are indicated in the HPI above.  All other systems are negative.  Review of Systems  Constitutional:  Positive for appetite change and fatigue.  Respiratory:  Negative for cough and shortness of breath.   Cardiovascular:  Negative for chest pain and leg swelling.  Gastrointestinal:  Negative for abdominal pain, diarrhea, nausea and vomiting.  Musculoskeletal:  Negative for back pain.  Neurological:  Negative for dizziness and headaches.  Hematological:  Bruises/bleeds easily.    Vital Signs: BP 113/60   Pulse 65   Temp 97.6 F (36.4 C)   Resp 16   Ht '5\' 3"'$  (1.6 m)   Wt 150 lb (68 kg)   SpO2 99%   BMI 26.57 kg/m   Physical Exam Constitutional:      General: She is not in acute distress.    Appearance: She is not ill-appearing.  HENT:     Mouth/Throat:     Mouth: Mucous membranes are moist.     Pharynx: Oropharynx is clear.     Comments: Edentulous  Cardiovascular:     Rate and Rhythm: Normal rate and regular rhythm.     Pulses: Normal pulses.     Heart sounds: Normal heart sounds.  Pulmonary:     Effort: Pulmonary effort is normal.     Breath sounds: Normal  breath sounds.  Abdominal:  General: Bowel sounds are normal.     Palpations: Abdomen is soft.     Tenderness: There is no abdominal tenderness.  Musculoskeletal:     Right lower leg: Edema present.     Left lower leg: Edema present.     Comments: Left arm lymphedema.   Skin:    General: Skin is warm and dry.     Comments: Right arm with extensive bruising. Bilateral lower extremities with brown skin discoloration, swelling and some mild ulcerations.   Neurological:     General: No focal deficit present.     Mental Status: She is alert and oriented to person, place, and time.  Psychiatric:        Mood and Affect: Mood normal.        Behavior: Behavior normal.        Thought Content: Thought content normal.        Judgment: Judgment normal.     Imaging: US Paracentesis  Result Date: 11/26/2022 INDICATION: Ascites, cirrhosis secondary to NASH EXAM: ULTRASOUND GUIDED DIAGNOSTIC AND THERAPEUTIC PARACENTESIS MEDICATIONS: None. COMPLICATIONS: None immediate. PROCEDURE: Informed written consent was obtained from the patient after a discussion of the risks, benefits and alternatives to treatment. A timeout was performed prior to the initiation of the procedure. Initial ultrasound scanning demonstrates a large amount of ascites within the right lower abdominal quadrant. The right lower abdomen was prepped and draped in the usual sterile fashion. 1% lidocaine was used for local anesthesia. Following this, a 19 gauge, 7-cm, Yueh catheter was introduced. An ultrasound image was saved for documentation purposes. The paracentesis was performed. The catheter was removed and a dressing was applied. The patient tolerated the procedure well without immediate post procedural complication. Patient received post-procedure intravenous albumin; see nursing notes for details. FINDINGS: A total of approximately 1.3 L of clear light yellow fluid was removed. Samples were sent to the laboratory as requested by  the clinical team. IMPRESSION: Successful ultrasound-guided paracentesis yielding 1.3 liters of peritoneal fluid. Electronically Signed   By: Lavonia Dana M.D.   On: 11/26/2022 11:19    Labs:  CBC: Recent Labs    10/24/22 0236 10/25/22 0959 10/27/22 1842 11/14/22 1210  WBC 5.5 3.5* 12.5* 5.3  HGB 10.1* 10.0* 11.1* 11.6*  HCT 29.6* 29.6* 34.5* 35.6*  PLT 114* 92* 114* 165    COAGS: Recent Labs    07/25/22 0704 09/29/22 1430 10/24/22 1105 10/27/22 1846  INR 2.1* 2.7* 2.0* 2.6*  APTT  --   --   --  39*    BMP: Recent Labs    10/24/22 0236 10/25/22 0959 10/27/22 1846 11/14/22 1210  NA 136 136 138 140  K 4.2 4.7 4.4 3.6  CL 112* 112* 110 108  CO2 18* 20* 17* 23  GLUCOSE 88 98 102* 93  BUN '20 19 21 18  '$ CALCIUM 8.0* 8.0* 8.8* 8.6*  CREATININE 2.34* 2.09* 2.02* 1.32*  GFRNONAA 21* 24* 25* 41*    LIVER FUNCTION TESTS: Recent Labs    10/20/22 0506 10/23/22 1430 10/27/22 1846 11/14/22 1210  BILITOT 0.8 1.2 1.2 1.5*  AST 24 33 31 57*  ALT '12 18 19 29  '$ ALKPHOS 57 71 73 79  PROT 3.9* 4.7* 4.9* 5.3*  ALBUMIN 2.5* 2.9* 2.6* 2.8*    TUMOR MARKERS: No results for input(s): "AFPTM", "CEA", "CA199", "CHROMGRNA" in the last 8760 hours.  Assessment and Plan:  Poor venous access; anemia with need for frequent iron infusions, lab draws and occasional blood transfusions:  [name], 82 year old female, presents today to the [place] [facility] department for an image-guided port-a-catheter placement.  Risks and benefits of image-guided port-a-catheter placement were discussed with the patient including, but not limited to bleeding, infection, pneumothorax, or fibrin sheath development and need for additional procedures.  All of the patient's questions were answered, patient is agreeable to proceed.  Consent signed and in chart.  Thank you for this interesting consult.  I greatly enjoyed meeting Novice Vrba and look forward to participating in their  care.  A copy of this report was sent to the requesting provider on this date.  Electronically Signed: Soyla Dryer, AGACNP-BC [phone] 12/25/2022, 10:15 AM   I spent a total of  30 Minutes   in face to face in clinical consultation, greater than 50% of which was counseling/coordinating care for port-a-catheter placement.

## 2022-12-26 IMAGING — MG MM DIGITAL SCREENING BILAT W/ TOMO AND CAD
6 of 12 series · 6 of 36 positions shown · non-contrast
Comparison: Previous exams 12/31/2016 and earlier from [facility]
[REDACTED], [place].

CLINICAL DATA: Screening. Personal history of malignant lumpectomy
from the LEFT breast in 0873.

EXAM:
DIGITAL SCREENING BILATERAL MAMMOGRAM WITH TOMOSYNTHESIS AND CAD
TECHNIQUE: Bilateral screening digital craniocaudal and mediolateral oblique
mammograms were obtained. Bilateral screening digital breast
tomosynthesis was performed. The images were evaluated with
computer-aided detection.

[L MLO synth-2D (1 of 2)]
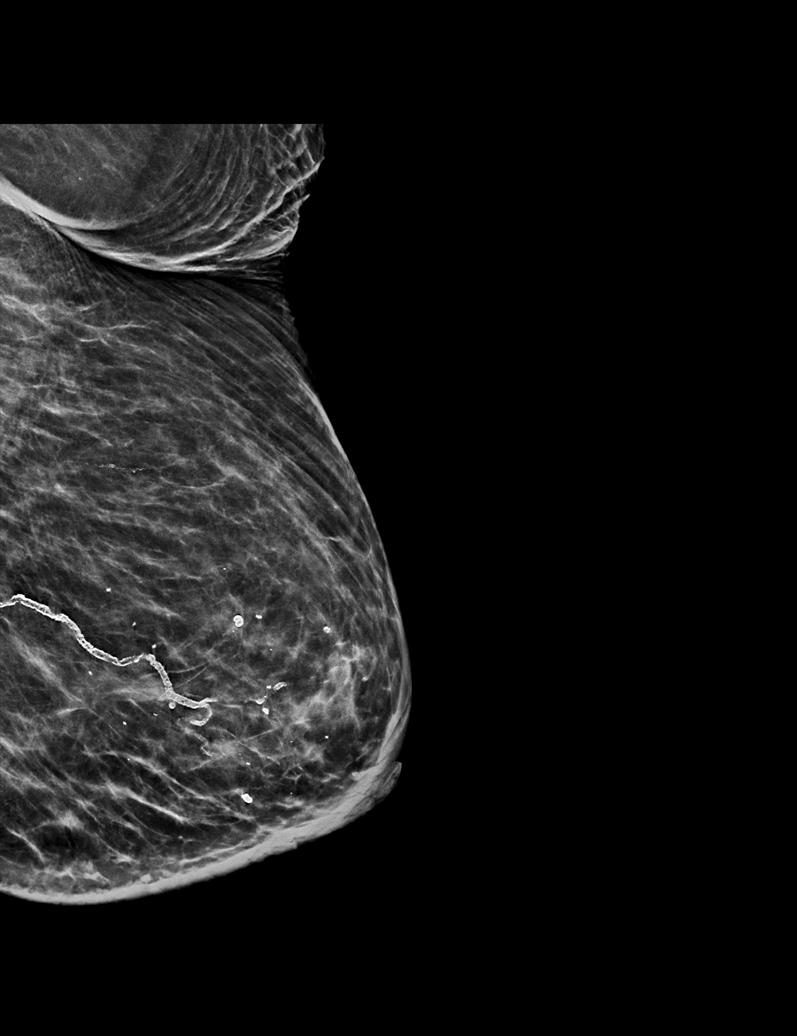

[R MLO synth-2D (1 of 2)]
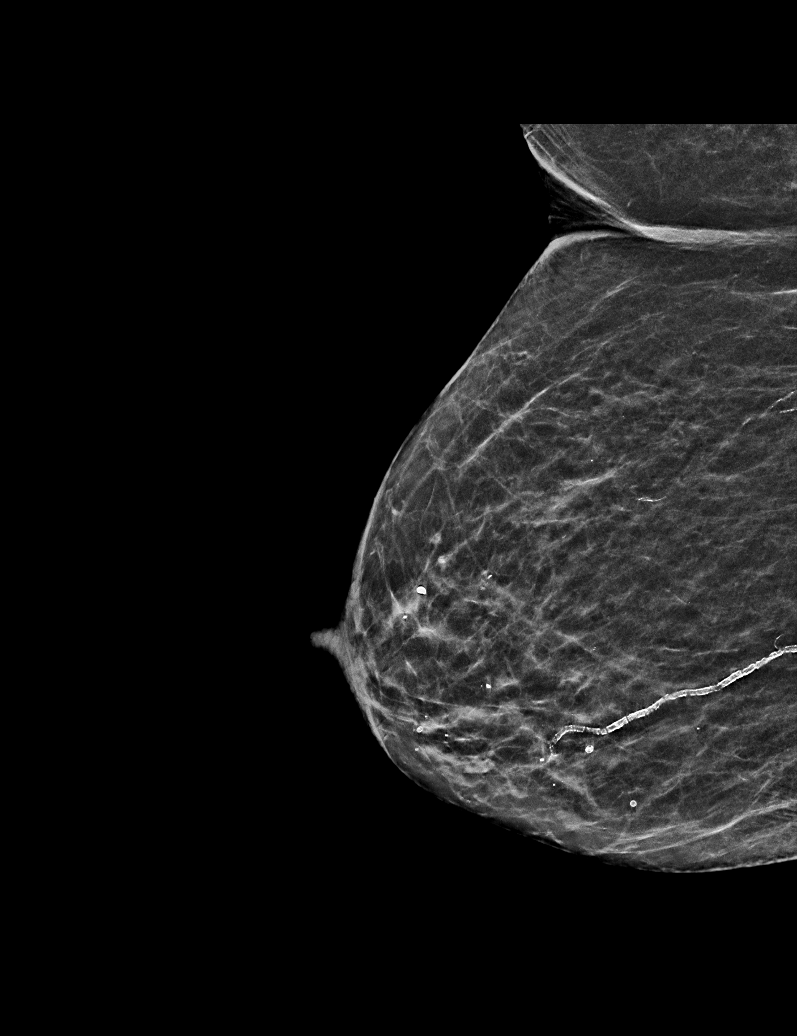

[L CC synth-2D]
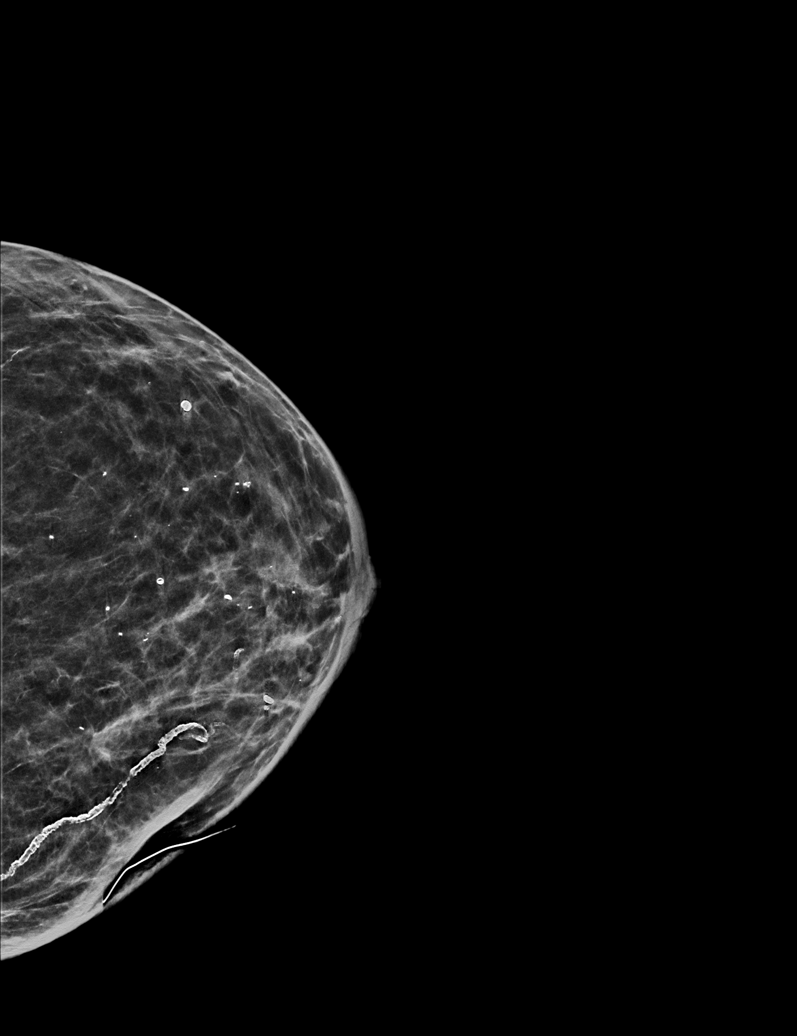

[R MLO synth-2D (2 of 2)]
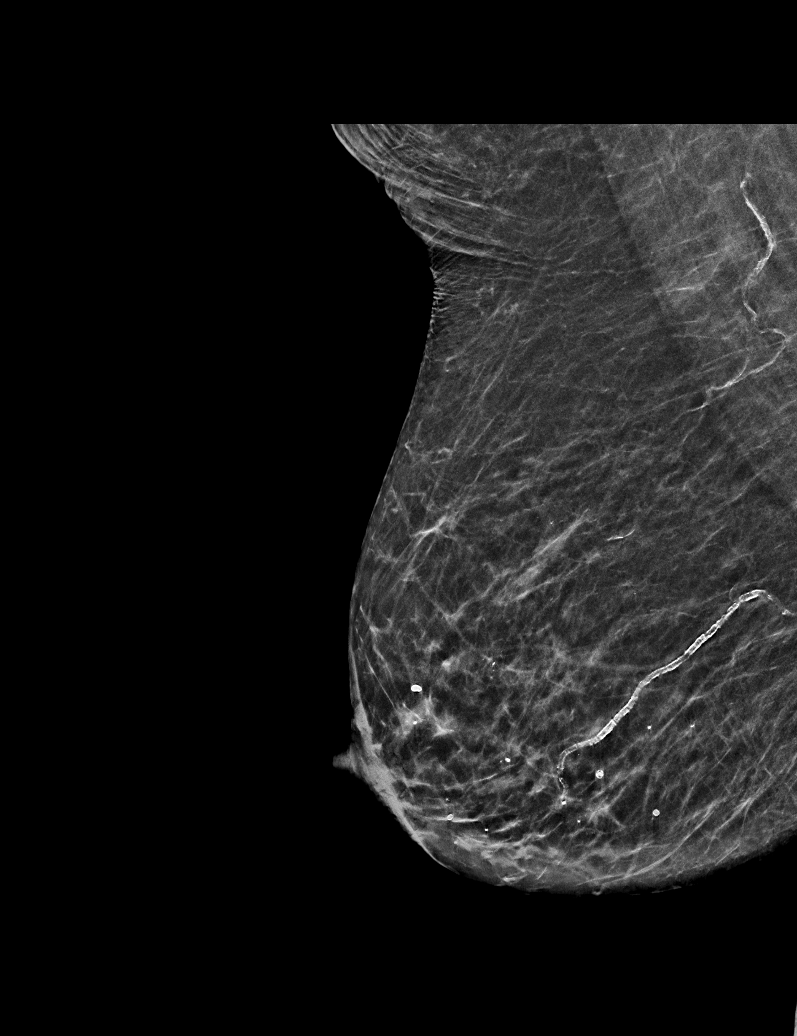

[L MLO synth-2D (2 of 2)]
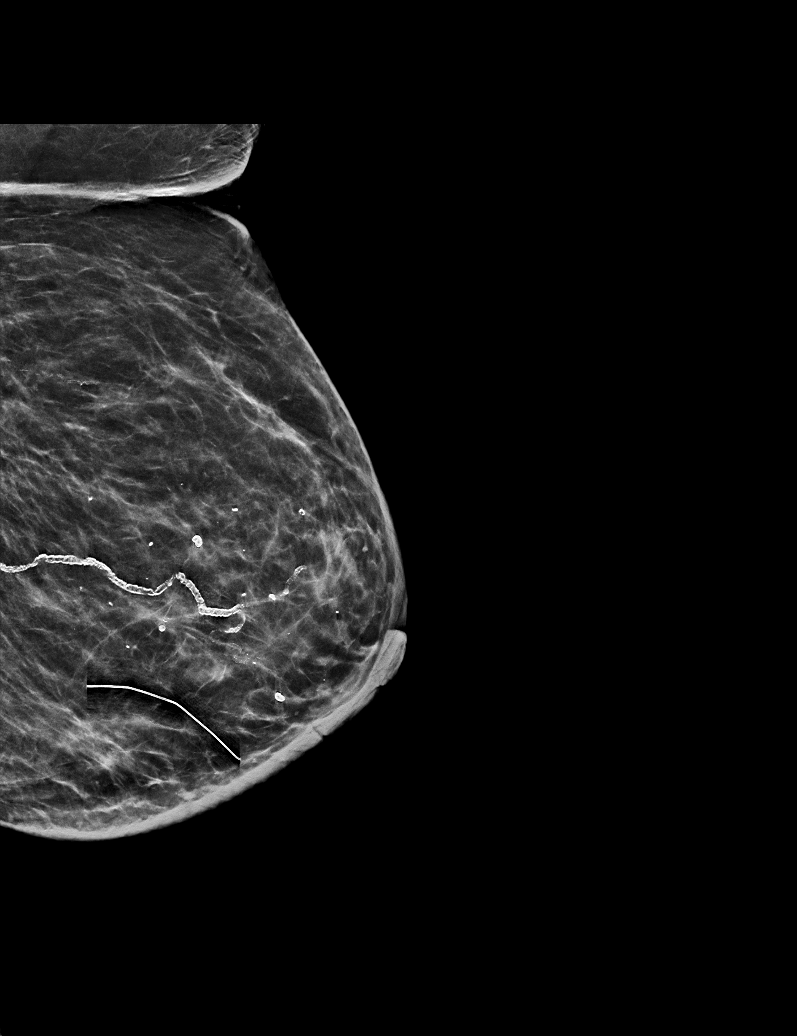

[R CC synth-2D]
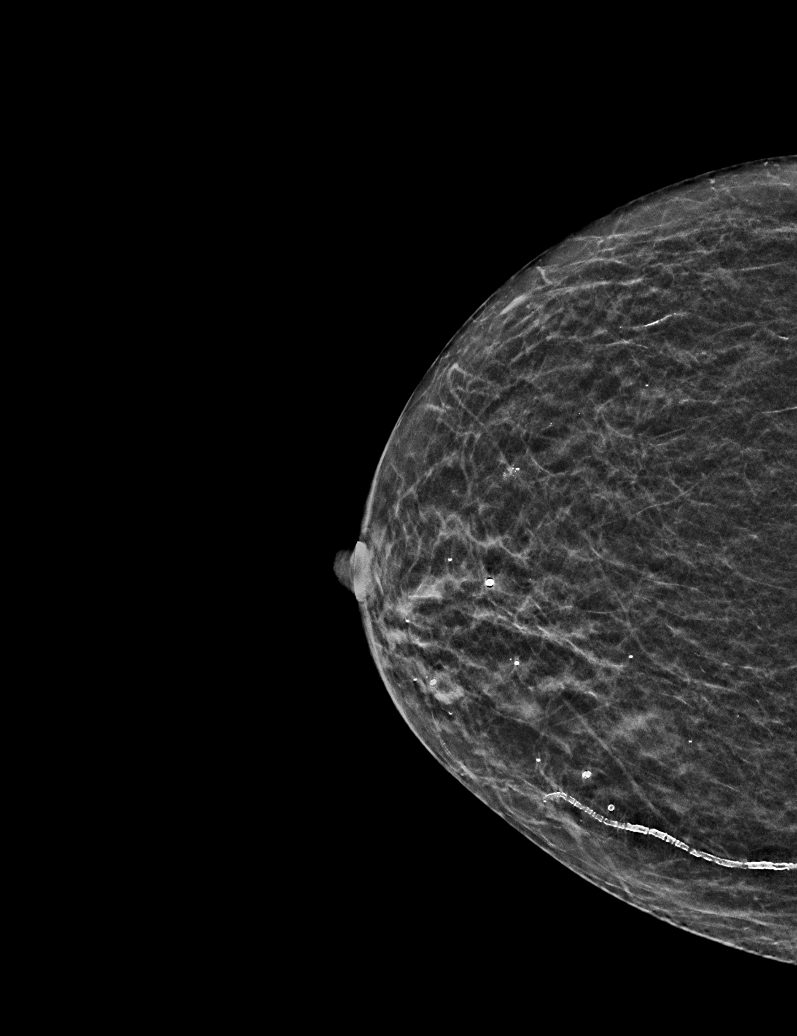

[6 of 36 positions shown; findings below may reference images not displayed]

Awaiting the prior outside
mammograms accounts for the delay in this report.

ACR Breast Density Category b: There are scattered areas of
fibroglandular density.
FINDINGS: There are no findings suspicious for malignancy. Stable post
lumpectomy changes and residual post radiation changes involving the
LEFT breast. The images were evaluated with computer-aided
detection.
IMPRESSION: No mammographic evidence of malignancy. A result letter of this
screening mammogram will be mailed directly to the patient.

RECOMMENDATION:
Screening mammogram in one year. (Code:WU-J-78S)

BI-RADS CATEGORY  2: Benign.

## 2022-12-29 ENCOUNTER — Inpatient Hospital Stay: Payer: Medicare Other

## 2022-12-29 DIAGNOSIS — D500 Iron deficiency anemia secondary to blood loss (chronic): Secondary | ICD-10-CM

## 2022-12-29 DIAGNOSIS — Z853 Personal history of malignant neoplasm of breast: Secondary | ICD-10-CM

## 2022-12-29 DIAGNOSIS — D696 Thrombocytopenia, unspecified: Secondary | ICD-10-CM

## 2022-12-29 DIAGNOSIS — E538 Deficiency of other specified B group vitamins: Secondary | ICD-10-CM

## 2022-12-29 LAB — CBC WITH DIFFERENTIAL/PLATELET
Abs Immature Granulocytes: 0.01 10*3/uL (ref 0.00–0.07)
Basophils Absolute: 0 10*3/uL (ref 0.0–0.1)
Basophils Relative: 1 %
Eosinophils Absolute: 0.2 10*3/uL (ref 0.0–0.5)
Eosinophils Relative: 4 %
HCT: 28.4 % — ABNORMAL LOW (ref 36.0–46.0)
Hemoglobin: 9.2 g/dL — ABNORMAL LOW (ref 12.0–15.0)
Immature Granulocytes: 0 %
Lymphocytes Relative: 22 %
Lymphs Abs: 0.9 10*3/uL (ref 0.7–4.0)
MCH: 33 pg (ref 26.0–34.0)
MCHC: 32.4 g/dL (ref 30.0–36.0)
MCV: 101.8 fL — ABNORMAL HIGH (ref 80.0–100.0)
Monocytes Absolute: 0.5 10*3/uL (ref 0.1–1.0)
Monocytes Relative: 12 %
Neutro Abs: 2.5 10*3/uL (ref 1.7–7.7)
Neutrophils Relative %: 61 %
Platelets: 166 10*3/uL (ref 150–400)
RBC: 2.79 MIL/uL — ABNORMAL LOW (ref 3.87–5.11)
RDW: 18.2 % — ABNORMAL HIGH (ref 11.5–15.5)
WBC: 4 10*3/uL (ref 4.0–10.5)
nRBC: 0 % (ref 0.0–0.2)

## 2022-12-29 LAB — COMPREHENSIVE METABOLIC PANEL
ALT: 15 U/L (ref 0–44)
AST: 27 U/L (ref 15–41)
Albumin: 2.1 g/dL — ABNORMAL LOW (ref 3.5–5.0)
Alkaline Phosphatase: 66 U/L (ref 38–126)
Anion gap: 6 (ref 5–15)
BUN: 27 mg/dL — ABNORMAL HIGH (ref 8–23)
CO2: 23 mmol/L (ref 22–32)
Calcium: 7.9 mg/dL — ABNORMAL LOW (ref 8.9–10.3)
Chloride: 107 mmol/L (ref 98–111)
Creatinine, Ser: 1.26 mg/dL — ABNORMAL HIGH (ref 0.44–1.00)
GFR, Estimated: 43 mL/min — ABNORMAL LOW (ref >60)
Glucose, Bld: 92 mg/dL (ref 70–99)
Potassium: 3.5 mmol/L (ref 3.5–5.1)
Sodium: 136 mmol/L (ref 135–145)
Total Bilirubin: 0.9 mg/dL (ref 0.3–1.2)
Total Protein: 4.8 g/dL — ABNORMAL LOW (ref 6.5–8.1)

## 2022-12-29 LAB — IRON AND TIBC
Iron: 44 ug/dL (ref 28–170)
Saturation Ratios: 33 % — ABNORMAL HIGH (ref 10.4–31.8)
TIBC: 134 ug/dL — ABNORMAL LOW (ref 250–450)
UIBC: 90 ug/dL

## 2022-12-29 LAB — FERRITIN: Ferritin: 697 ng/mL — ABNORMAL HIGH (ref 11–307)

## 2022-12-29 LAB — VITAMIN B12: Vitamin B-12: 2641 pg/mL — ABNORMAL HIGH (ref 180–914)

## 2022-12-29 MED ORDER — SODIUM CHLORIDE 0.9% FLUSH
10.0000 mL | INTRAVENOUS | Status: AC
  Administered 2022-12-29: 10 mL

## 2022-12-29 MED ORDER — HEPARIN SOD (PORK) LOCK FLUSH 100 UNIT/ML IV SOLN
500.0000 [IU] | Freq: Once | INTRAVENOUS | Status: AC
  Administered 2022-12-29: 500 [IU] via INTRAVENOUS

## 2022-12-29 NOTE — Progress Notes (Signed)
Tasha Roberts presented for Portacath access and flush with lab work. Portacath located right chest wall accessed with  H 20 needle.  Good blood return present. Portacath flushed with 36m NS and 500U/578mHeparin and needle removed intact.  Procedure tolerated well and without incident.     Vital signs stable. No complaints at this time. Discharged from clinic by wheel chair in stable condition. Alert and oriented x 3. [facility]

## 2022-12-30 ENCOUNTER — Ambulatory Visit (HOSPITAL_COMMUNITY): Payer: Medicare Other

## 2022-12-30 ENCOUNTER — Inpatient Hospital Stay (HOSPITAL_COMMUNITY): Admission: RE | Admit: 2022-12-30 | Payer: Medicare Other | Source: Ambulatory Visit

## 2022-12-30 ENCOUNTER — Ambulatory Visit: Payer: Medicare Other | Admitting: Gastroenterology

## 2022-12-30 ENCOUNTER — Ambulatory Visit: Payer: Medicare Other | Admitting: Internal Medicine

## 2022-12-30 ENCOUNTER — Telehealth: Payer: Self-pay | Admitting: Internal Medicine

## 2022-12-30 LAB — CERULOPLASMIN: Ceruloplasmin: 12.1 mg/dL — ABNORMAL LOW (ref 19.0–39.0)

## 2022-12-30 NOTE — Telephone Encounter (Signed)
Tasha Roberts called from [facility] [phone] appointment tomorrow with provider., patient has gained 12 to 14 pounds within the last 2 weeks and over the weekend legs are skinned from hitting on her wheelchair needs it to be looked at it, patient having more shortness of breathe and extremely weak. Tasha Roberts asked if Dr Posey Pronto will address these issues with patient tomorrow appointment.

## 2022-12-31 ENCOUNTER — Ambulatory Visit: Payer: Medicare Other | Admitting: Internal Medicine

## 2022-12-31 LAB — COPPER, SERUM: Copper: 70 ug/dL — ABNORMAL LOW (ref 80–158)

## 2023-01-01 LAB — METHYLMALONIC ACID, SERUM: Methylmalonic Acid, Quantitative: 191 nmol/L (ref 0–378)

## 2023-01-02 ENCOUNTER — Ambulatory Visit: Payer: Medicare Other

## 2023-01-03 ENCOUNTER — Other Ambulatory Visit: Payer: Self-pay

## 2023-01-03 ENCOUNTER — Inpatient Hospital Stay (HOSPITAL_COMMUNITY)
Admission: EM | Admit: 2023-01-03 | Discharge: 2023-01-09 | DRG: 871 | Disposition: A | Discharge status: Hospice | Payer: Medicare Other | Attending: Internal Medicine | Admitting: Internal Medicine

## 2023-01-03 ENCOUNTER — Emergency Department (HOSPITAL_COMMUNITY): Payer: Medicare Other

## 2023-01-03 DIAGNOSIS — Z515 Encounter for palliative care: Secondary | ICD-10-CM

## 2023-01-03 DIAGNOSIS — I959 Hypotension, unspecified: Principal | ICD-10-CM

## 2023-01-03 DIAGNOSIS — Z79890 Hormone replacement therapy (postmenopausal): Secondary | ICD-10-CM

## 2023-01-03 DIAGNOSIS — Z1152 Encounter for screening for COVID-19: Secondary | ICD-10-CM

## 2023-01-03 DIAGNOSIS — K7581 Nonalcoholic steatohepatitis (NASH): Secondary | ICD-10-CM | POA: Diagnosis present

## 2023-01-03 DIAGNOSIS — R627 Adult failure to thrive: Secondary | ICD-10-CM | POA: Diagnosis present

## 2023-01-03 DIAGNOSIS — N390 Urinary tract infection, site not specified: Secondary | ICD-10-CM | POA: Diagnosis present

## 2023-01-03 DIAGNOSIS — Z7189 Other specified counseling: Secondary | ICD-10-CM

## 2023-01-03 DIAGNOSIS — M549 Dorsalgia, unspecified: Secondary | ICD-10-CM | POA: Diagnosis not present

## 2023-01-03 DIAGNOSIS — K7290 Hepatic failure, unspecified without coma: Secondary | ICD-10-CM | POA: Diagnosis present

## 2023-01-03 DIAGNOSIS — D62 Acute posthemorrhagic anemia: Secondary | ICD-10-CM | POA: Diagnosis present

## 2023-01-03 DIAGNOSIS — R4589 Other symptoms and signs involving emotional state: Secondary | ICD-10-CM

## 2023-01-03 DIAGNOSIS — X58XXXA Exposure to other specified factors, initial encounter: Secondary | ICD-10-CM | POA: Diagnosis present

## 2023-01-03 DIAGNOSIS — I2510 Atherosclerotic heart disease of native coronary artery without angina pectoris: Secondary | ICD-10-CM | POA: Diagnosis present

## 2023-01-03 DIAGNOSIS — E890 Postprocedural hypothyroidism: Secondary | ICD-10-CM | POA: Diagnosis present

## 2023-01-03 DIAGNOSIS — K766 Portal hypertension: Secondary | ICD-10-CM | POA: Diagnosis present

## 2023-01-03 DIAGNOSIS — D509 Iron deficiency anemia, unspecified: Secondary | ICD-10-CM

## 2023-01-03 DIAGNOSIS — E2740 Unspecified adrenocortical insufficiency: Secondary | ICD-10-CM | POA: Diagnosis present

## 2023-01-03 DIAGNOSIS — Z888 Allergy status to other drugs, medicaments and biological substances status: Secondary | ICD-10-CM

## 2023-01-03 DIAGNOSIS — E8809 Other disorders of plasma-protein metabolism, not elsewhere classified: Secondary | ICD-10-CM | POA: Diagnosis present

## 2023-01-03 DIAGNOSIS — D869 Sarcoidosis, unspecified: Secondary | ICD-10-CM | POA: Diagnosis present

## 2023-01-03 DIAGNOSIS — E039 Hypothyroidism, unspecified: Secondary | ICD-10-CM

## 2023-01-03 DIAGNOSIS — Z8249 Family history of ischemic heart disease and other diseases of the circulatory system: Secondary | ICD-10-CM

## 2023-01-03 DIAGNOSIS — I890 Lymphedema, not elsewhere classified: Secondary | ICD-10-CM | POA: Diagnosis present

## 2023-01-03 DIAGNOSIS — Z87440 Personal history of urinary (tract) infections: Secondary | ICD-10-CM

## 2023-01-03 DIAGNOSIS — Z923 Personal history of irradiation: Secondary | ICD-10-CM

## 2023-01-03 DIAGNOSIS — R579 Shock, unspecified: Secondary | ICD-10-CM

## 2023-01-03 DIAGNOSIS — K7460 Unspecified cirrhosis of liver: Secondary | ICD-10-CM | POA: Diagnosis present

## 2023-01-03 DIAGNOSIS — N184 Chronic kidney disease, stage 4 (severe): Secondary | ICD-10-CM | POA: Diagnosis present

## 2023-01-03 DIAGNOSIS — Z821 Family history of blindness and visual loss: Secondary | ICD-10-CM

## 2023-01-03 DIAGNOSIS — R52 Pain, unspecified: Secondary | ICD-10-CM

## 2023-01-03 DIAGNOSIS — R0602 Shortness of breath: Secondary | ICD-10-CM | POA: Diagnosis not present

## 2023-01-03 DIAGNOSIS — E8770 Fluid overload, unspecified: Secondary | ICD-10-CM

## 2023-01-03 DIAGNOSIS — E8779 Other fluid overload: Secondary | ICD-10-CM | POA: Diagnosis present

## 2023-01-03 DIAGNOSIS — Z79899 Other long term (current) drug therapy: Secondary | ICD-10-CM

## 2023-01-03 DIAGNOSIS — Z818 Family history of other mental and behavioral disorders: Secondary | ICD-10-CM

## 2023-01-03 DIAGNOSIS — F419 Anxiety disorder, unspecified: Secondary | ICD-10-CM | POA: Diagnosis present

## 2023-01-03 DIAGNOSIS — Z9010 Acquired absence of unspecified breast and nipple: Secondary | ICD-10-CM

## 2023-01-03 DIAGNOSIS — E1165 Type 2 diabetes mellitus with hyperglycemia: Secondary | ICD-10-CM | POA: Diagnosis present

## 2023-01-03 DIAGNOSIS — Z7901 Long term (current) use of anticoagulants: Secondary | ICD-10-CM

## 2023-01-03 DIAGNOSIS — R110 Nausea: Secondary | ICD-10-CM

## 2023-01-03 DIAGNOSIS — R451 Restlessness and agitation: Secondary | ICD-10-CM

## 2023-01-03 DIAGNOSIS — L89152 Pressure ulcer of sacral region, stage 2: Secondary | ICD-10-CM | POA: Diagnosis present

## 2023-01-03 DIAGNOSIS — Z9049 Acquired absence of other specified parts of digestive tract: Secondary | ICD-10-CM

## 2023-01-03 DIAGNOSIS — E440 Moderate protein-calorie malnutrition: Secondary | ICD-10-CM

## 2023-01-03 DIAGNOSIS — A419 Sepsis, unspecified organism: Secondary | ICD-10-CM | POA: Diagnosis not present

## 2023-01-03 DIAGNOSIS — F32A Depression, unspecified: Secondary | ICD-10-CM | POA: Diagnosis present

## 2023-01-03 DIAGNOSIS — E876 Hypokalemia: Secondary | ICD-10-CM | POA: Diagnosis present

## 2023-01-03 DIAGNOSIS — K649 Unspecified hemorrhoids: Secondary | ICD-10-CM | POA: Diagnosis present

## 2023-01-03 DIAGNOSIS — E1122 Type 2 diabetes mellitus with diabetic chronic kidney disease: Secondary | ICD-10-CM | POA: Diagnosis present

## 2023-01-03 DIAGNOSIS — I129 Hypertensive chronic kidney disease with stage 1 through stage 4 chronic kidney disease, or unspecified chronic kidney disease: Secondary | ICD-10-CM | POA: Diagnosis present

## 2023-01-03 DIAGNOSIS — I480 Paroxysmal atrial fibrillation: Secondary | ICD-10-CM

## 2023-01-03 DIAGNOSIS — Z993 Dependence on wheelchair: Secondary | ICD-10-CM

## 2023-01-03 DIAGNOSIS — Z66 Do not resuscitate: Secondary | ICD-10-CM

## 2023-01-03 DIAGNOSIS — M6259 Muscle wasting and atrophy, not elsewhere classified, multiple sites: Secondary | ICD-10-CM | POA: Diagnosis present

## 2023-01-03 DIAGNOSIS — E785 Hyperlipidemia, unspecified: Secondary | ICD-10-CM | POA: Diagnosis present

## 2023-01-03 DIAGNOSIS — R001 Bradycardia, unspecified: Secondary | ICD-10-CM | POA: Diagnosis not present

## 2023-01-03 DIAGNOSIS — J90 Pleural effusion, not elsewhere classified: Secondary | ICD-10-CM

## 2023-01-03 DIAGNOSIS — S81811A Laceration without foreign body, right lower leg, initial encounter: Secondary | ICD-10-CM | POA: Diagnosis present

## 2023-01-03 DIAGNOSIS — Z822 Family history of deafness and hearing loss: Secondary | ICD-10-CM

## 2023-01-03 DIAGNOSIS — L8990 Pressure ulcer of unspecified site, unspecified stage: Secondary | ICD-10-CM | POA: Insufficient documentation

## 2023-01-03 DIAGNOSIS — D684 Acquired coagulation factor deficiency: Secondary | ICD-10-CM | POA: Diagnosis present

## 2023-01-03 DIAGNOSIS — E871 Hypo-osmolality and hyponatremia: Secondary | ICD-10-CM | POA: Diagnosis not present

## 2023-01-03 DIAGNOSIS — R6521 Severe sepsis with septic shock: Secondary | ICD-10-CM | POA: Diagnosis present

## 2023-01-03 DIAGNOSIS — Z823 Family history of stroke: Secondary | ICD-10-CM

## 2023-01-03 DIAGNOSIS — R188 Other ascites: Secondary | ICD-10-CM | POA: Diagnosis present

## 2023-01-03 DIAGNOSIS — N179 Acute kidney failure, unspecified: Secondary | ICD-10-CM

## 2023-01-03 DIAGNOSIS — Z853 Personal history of malignant neoplasm of breast: Secondary | ICD-10-CM

## 2023-01-03 DIAGNOSIS — I9589 Other hypotension: Secondary | ICD-10-CM | POA: Diagnosis present

## 2023-01-03 DIAGNOSIS — Z90710 Acquired absence of both cervix and uterus: Secondary | ICD-10-CM

## 2023-01-03 DIAGNOSIS — Z87412 Personal history of vulvar dysplasia: Secondary | ICD-10-CM

## 2023-01-03 DIAGNOSIS — M810 Age-related osteoporosis without current pathological fracture: Secondary | ICD-10-CM | POA: Diagnosis present

## 2023-01-03 DIAGNOSIS — G4700 Insomnia, unspecified: Secondary | ICD-10-CM | POA: Diagnosis not present

## 2023-01-03 LAB — CBC WITH DIFFERENTIAL/PLATELET
Abs Immature Granulocytes: 0.02 10*3/uL (ref 0.00–0.07)
Basophils Absolute: 0 10*3/uL (ref 0.0–0.1)
Basophils Relative: 0 %
Eosinophils Absolute: 0.1 10*3/uL (ref 0.0–0.5)
Eosinophils Relative: 1 %
HCT: 25.6 % — ABNORMAL LOW (ref 36.0–46.0)
Hemoglobin: 8.6 g/dL — ABNORMAL LOW (ref 12.0–15.0)
Immature Granulocytes: 0 %
Lymphocytes Relative: 16 %
Lymphs Abs: 0.7 10*3/uL (ref 0.7–4.0)
MCH: 33.9 pg (ref 26.0–34.0)
MCHC: 33.6 g/dL (ref 30.0–36.0)
MCV: 100.8 fL — ABNORMAL HIGH (ref 80.0–100.0)
Monocytes Absolute: 0.5 10*3/uL (ref 0.1–1.0)
Monocytes Relative: 11 %
Neutro Abs: 3.4 10*3/uL (ref 1.7–7.7)
Neutrophils Relative %: 72 %
Platelets: 171 10*3/uL (ref 150–400)
RBC: 2.54 MIL/uL — ABNORMAL LOW (ref 3.87–5.11)
RDW: 19.7 % — ABNORMAL HIGH (ref 11.5–15.5)
WBC: 4.7 10*3/uL (ref 4.0–10.5)
nRBC: 0 % (ref 0.0–0.2)

## 2023-01-03 LAB — URINALYSIS, W/ REFLEX TO CULTURE (INFECTION SUSPECTED)
Bilirubin Urine: NEGATIVE
Glucose, UA: NEGATIVE mg/dL
Ketones, ur: NEGATIVE mg/dL
Nitrite: NEGATIVE
Protein, ur: NEGATIVE mg/dL
Specific Gravity, Urine: 1.028 (ref 1.005–1.030)
pH: 5 (ref 5.0–8.0)

## 2023-01-03 LAB — COMPREHENSIVE METABOLIC PANEL
ALT: 14 U/L (ref 0–44)
AST: 33 U/L (ref 15–41)
Albumin: 1.9 g/dL — ABNORMAL LOW (ref 3.5–5.0)
Alkaline Phosphatase: 69 U/L (ref 38–126)
Anion gap: 10 (ref 5–15)
BUN: 27 mg/dL — ABNORMAL HIGH (ref 8–23)
CO2: 19 mmol/L — ABNORMAL LOW (ref 22–32)
Calcium: 7.7 mg/dL — ABNORMAL LOW (ref 8.9–10.3)
Chloride: 109 mmol/L (ref 98–111)
Creatinine, Ser: 1.68 mg/dL — ABNORMAL HIGH (ref 0.44–1.00)
GFR, Estimated: 31 mL/min — ABNORMAL LOW (ref >60)
Glucose, Bld: 118 mg/dL — ABNORMAL HIGH (ref 70–99)
Potassium: 3.5 mmol/L (ref 3.5–5.1)
Sodium: 138 mmol/L (ref 135–145)
Total Bilirubin: 0.9 mg/dL (ref 0.3–1.2)
Total Protein: 4.3 g/dL — ABNORMAL LOW (ref 6.5–8.1)

## 2023-01-03 LAB — PROTIME-INR
INR: 2.2 — ABNORMAL HIGH (ref 0.8–1.2)
Prothrombin Time: 24.3 seconds — ABNORMAL HIGH (ref 11.4–15.2)

## 2023-01-03 LAB — BRAIN NATRIURETIC PEPTIDE: B Natriuretic Peptide: 259.3 pg/mL — ABNORMAL HIGH (ref 0.0–100.0)

## 2023-01-03 LAB — LIPASE, BLOOD: Lipase: 26 U/L (ref 11–51)

## 2023-01-03 LAB — AMMONIA: Ammonia: 46 umol/L — ABNORMAL HIGH (ref 9–35)

## 2023-01-03 LAB — LACTIC ACID, PLASMA
Lactic Acid, Venous: 2.8 mmol/L (ref 0.5–1.9)
Lactic Acid, Venous: 2.1 mmol/L (ref 0.5–1.9)

## 2023-01-03 LAB — APTT: aPTT: 43 seconds — ABNORMAL HIGH (ref 24–36)

## 2023-01-03 MED ORDER — VANCOMYCIN HCL IN DEXTROSE 1-5 GM/200ML-% IV SOLN
1000.0000 mg | Freq: Once | INTRAVENOUS | Status: AC
  Administered 2023-01-03: 1000 mg via INTRAVENOUS
  Filled 2023-01-03: qty 200

## 2023-01-03 MED ORDER — MIDODRINE HCL 5 MG PO TABS
5.0000 mg | ORAL_TABLET | ORAL | Status: AC
  Administered 2023-01-03: 5 mg via ORAL
  Filled 2023-01-03: qty 1

## 2023-01-03 MED ORDER — SODIUM CHLORIDE 0.9 % IV SOLN
2.0000 g | Freq: Once | INTRAVENOUS | Status: AC
  Administered 2023-01-03: 2 g via INTRAVENOUS
  Filled 2023-01-03: qty 12.5

## 2023-01-03 MED ORDER — SODIUM CHLORIDE 0.9 % IV BOLUS (SEPSIS)
500.0000 mL | Freq: Once | INTRAVENOUS | Status: AC
  Administered 2023-01-03: 500 mL via INTRAVENOUS

## 2023-01-03 MED ORDER — IOHEXOL 350 MG/ML SOLN
75.0000 mL | Freq: Once | INTRAVENOUS | Status: AC | PRN
  Administered 2023-01-03: 75 mL via INTRAVENOUS

## 2023-01-03 MED ORDER — METRONIDAZOLE 500 MG/100ML IV SOLN
500.0000 mg | Freq: Once | INTRAVENOUS | Status: AC
  Administered 2023-01-03: 500 mg via INTRAVENOUS
  Filled 2023-01-03: qty 100

## 2023-01-03 MED ORDER — VANCOMYCIN HCL IN DEXTROSE 1-5 GM/200ML-% IV SOLN: 1000.0000 mg | INTRAVENOUS | Status: DC

## 2023-01-03 MED ORDER — SODIUM CHLORIDE 0.9 % IV SOLN
2.0000 g | INTRAVENOUS | Status: DC
  Administered 2023-01-04 – 2023-01-06 (×3): 2 g via INTRAVENOUS
  Filled 2023-01-03 (×3): qty 12.5

## 2023-01-03 NOTE — ED Provider Notes (Signed)
[facility] Provider Note   CSN: [id number] Arrival date & time: 01/03/23  1858     History {Add pertinent medical, surgical, social history, OB history to HPI:1} Chief Complaint  Patient presents with   Fatigue   Shortness of Breath    Tasha Roberts is a 82 y.o. female.  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  Patient reports that several**weeks ago was diagnosed with a urinary tract infection.  Family reports that since then has been gradually declining.  Has had worsening lower extremity edema and shortness of breath.  Also with abdominal swelling.  Says that her shortness of breath became worse today so she came into the emergency department for evaluation.  Typically has blood pressures in the 983J systolic.  Is not currently taking midodrine anymore because of nausea and vomiting that she was having.  Denies any fevers, cough, runny nose, or sore throat.  No nausea or vomiting or diarrhea.  Denies any blood in her stool.       Home Medications Prior to Admission medications   Medication Sig Start Date End Date Taking? Authorizing Provider  apixaban (ELIQUIS) 2.5 MG TABS tablet Take 1 tablet (2.5 mg total) by mouth 2 (two) times daily. 10/20/22   Shahmehdi, Valeria Batman, MD  atorvastatin (LIPITOR) 20 MG tablet TAKE (1) TABLET BY MOUTH ONCE DAILY. Patient taking differently: Take 20 mg by mouth daily. 11/07/22   Lindell Spar, MD  Cholecalciferol 50 MCG (2000 UT) CAPS Take 1 capsule (2,000 Units total) by mouth daily. 03/30/22   Antonieta Pert, MD  diltiazem (CARDIZEM CD) 120 MG 24 hr capsule TAKE (1) CAPSULE BY MOUTH ONCE DAILY. Patient taking differently: Take 120 mg by mouth daily. 05/16/22   Arnoldo Lenis, MD  feeding supplement (ENSURE ENLIVE / ENSURE PLUS) LIQD Take 237 mLs by mouth 2  (two) times daily between meals. 07/28/22   Manuella Ghazi, Pratik D, DO  fidaxomicin (DIFICID) 200 MG TABS tablet Take 1 tablet (200 mg total) by mouth 2 (two) times daily for 10 days Patient not taking: Reported on 12/25/2022 10/25/22   Roxan Hockey, MD  levothyroxine (SYNTHROID) 100 MCG tablet Take 1 tablet (100 mcg total) by mouth daily before breakfast. 06/09/22   Lindell Spar, MD  midodrine (PROAMATINE) 5 MG tablet Take 1 tablet (5 mg total) by mouth 3 (three) times daily with meals. Patient not taking: Reported on 12/25/2022 10/25/22   Roxan Hockey, MD  Misc. Devices [place] Lift chair - 1. 11/19/22   Lindell Spar, MD  ondansetron (ZOFRAN-ODT) 4 MG disintegrating tablet Take 1 tablet (4 mg total) by mouth every 8 (eight) hours as needed for nausea or vomiting. 12/05/22   Lindell Spar, MD  tolterodine (DETROL LA) 4 MG 24 hr capsule Take 1 capsule (4 mg total) by mouth daily. Patient not taking: Reported on 12/25/2022 08/28/22   Cleon Gustin, MD  vitamin B-12 (CYANOCOBALAMIN) 500 MCG tablet Take 500 mcg by mouth daily.    [provider]      Allergies    Dicyclomine, Glucophage [metformin], Midodrine hcl, Midol [acetaminophen], and Losartan    Review of Systems   Review of Systems  Physical Exam Updated Vital Signs BP (!) 85/53 (BP Location: Right Arm)   Pulse 75   Temp (!) 97.2 F (36.2 C) (Axillary)   Resp  16   SpO2 100%  Physical Exam  ED Results / Procedures / Treatments   Labs (all labs ordered are listed, but only abnormal results are displayed) Labs Reviewed - No data to display  EKG None  Radiology No results found.  Procedures Procedures  {Document cardiac monitor, telemetry assessment procedure when appropriate:1}  Medications Ordered in ED Medications - No data to display  ED Course/ Medical Decision Making/ A&P   {   Click here for ABCD2, HEART and other calculatorsREFRESH Note before signing :1}                          Medical  Decision Making Amount and/or Complexity of Data Reviewed Labs: ordered. Radiology: ordered. ECG/medicine tests: ordered.  Risk Prescription drug management.   ***  {Document critical care time when appropriate:1} {Document review of labs and clinical decision tools ie heart score, Chads2Vasc2 etc:1}  {Document your independent review of radiology images, and any outside records:1} {Document your discussion with family members, caretakers, and with consultants:1} {Document social determinants of health affecting pt's care:1} {Document your decision making why or why not admission, treatments were needed:1} Final Clinical Impression(s) / ED Diagnoses Final diagnoses:  None    Rx / DC Orders ED Discharge Orders     None

## 2023-01-03 NOTE — ED Triage Notes (Signed)
Patient arrives via ems from home secondary to feeling weak. Patient states she "has fluids build up again" in her abd due to her ascitis. Ems reported bp of 81/51 hr 71 prior arrival.

## 2023-01-03 NOTE — Progress Notes (Signed)
Pharmacy Antibiotic Note  Tasha Roberts is a 82 y.o. female for which pharmacy has been consulted for cefepime and vancomycin dosing for sepsis.  Patient with a history of NASH cirrhosis, AF, clotting disorder on eliquis, anemia, CKD, Graves', psychosis. Patient presenting with SOB.  SCr 1.68 - 1.26 on 12/29/22 WBC 4.7; LA 2.8; T 97.2; HR 65; RR 16  Plan: Metronidazole per MD Cefepime 2g q24hr Vancomycin 1000 mg once then 1000 mg q48hr (eAUC 443.2) unless change in renal function ---Scr up acutely today. May require dose adjustment in AM. Will trend. Trend WBC, Fever, Renal function F/u cultures, clinical course, WBC De-escalate when able     Temp (24hrs), Avg:97.2 F (36.2 C), Min:97.2 F (36.2 C), Max:97.2 F (36.2 C)  Recent Labs  Lab 12/29/22 1122  WBC 4.0  CREATININE 1.26*    Estimated Creatinine Clearance: 33.5 mL/min (A) (by C-G formula based on SCr of 1.26 mg/dL (H)).    Allergies  Allergen Reactions   Dicyclomine Other (See Comments)    Dry cough   Glucophage [Metformin] Diarrhea   Midodrine Hcl Nausea And Vomiting   Midol [Acetaminophen]    Losartan Other (See Comments)    Heartburn     Antimicrobials this admission: cefepime 1/27 >>  flagyl 1/27 >>  vancomycin 1/27 >>   Microbiology results: Pending  Thank you for allowing pharmacy to be a part of this patient's care.  Lorelei Pont, PharmD, BCPS 01/03/2023 7:26 PM ED Clinical Pharmacist -  [phone]

## 2023-01-04 ENCOUNTER — Other Ambulatory Visit: Payer: Self-pay

## 2023-01-04 ENCOUNTER — Inpatient Hospital Stay (HOSPITAL_COMMUNITY): Payer: Medicare Other

## 2023-01-04 ENCOUNTER — Encounter (HOSPITAL_COMMUNITY): Payer: Self-pay | Admitting: Critical Care Medicine

## 2023-01-04 DIAGNOSIS — E890 Postprocedural hypothyroidism: Secondary | ICD-10-CM | POA: Diagnosis present

## 2023-01-04 DIAGNOSIS — K7460 Unspecified cirrhosis of liver: Principal | ICD-10-CM | POA: Diagnosis present

## 2023-01-04 DIAGNOSIS — E440 Moderate protein-calorie malnutrition: Secondary | ICD-10-CM | POA: Diagnosis present

## 2023-01-04 DIAGNOSIS — E039 Hypothyroidism, unspecified: Secondary | ICD-10-CM

## 2023-01-04 DIAGNOSIS — Z515 Encounter for palliative care: Secondary | ICD-10-CM | POA: Diagnosis not present

## 2023-01-04 DIAGNOSIS — E1122 Type 2 diabetes mellitus with diabetic chronic kidney disease: Secondary | ICD-10-CM | POA: Diagnosis present

## 2023-01-04 DIAGNOSIS — D684 Acquired coagulation factor deficiency: Secondary | ICD-10-CM | POA: Diagnosis present

## 2023-01-04 DIAGNOSIS — L89152 Pressure ulcer of sacral region, stage 2: Secondary | ICD-10-CM | POA: Diagnosis present

## 2023-01-04 DIAGNOSIS — E8770 Fluid overload, unspecified: Secondary | ICD-10-CM | POA: Diagnosis not present

## 2023-01-04 DIAGNOSIS — R0602 Shortness of breath: Secondary | ICD-10-CM | POA: Diagnosis present

## 2023-01-04 DIAGNOSIS — K766 Portal hypertension: Secondary | ICD-10-CM | POA: Diagnosis present

## 2023-01-04 DIAGNOSIS — Z1152 Encounter for screening for COVID-19: Secondary | ICD-10-CM | POA: Diagnosis not present

## 2023-01-04 DIAGNOSIS — A419 Sepsis, unspecified organism: Secondary | ICD-10-CM | POA: Diagnosis present

## 2023-01-04 DIAGNOSIS — K7290 Hepatic failure, unspecified without coma: Secondary | ICD-10-CM | POA: Diagnosis not present

## 2023-01-04 DIAGNOSIS — N390 Urinary tract infection, site not specified: Secondary | ICD-10-CM | POA: Diagnosis present

## 2023-01-04 DIAGNOSIS — I959 Hypotension, unspecified: Secondary | ICD-10-CM

## 2023-01-04 DIAGNOSIS — R64 Cachexia: Secondary | ICD-10-CM | POA: Diagnosis not present

## 2023-01-04 DIAGNOSIS — R579 Shock, unspecified: Secondary | ICD-10-CM | POA: Diagnosis not present

## 2023-01-04 DIAGNOSIS — R188 Other ascites: Secondary | ICD-10-CM

## 2023-01-04 DIAGNOSIS — N184 Chronic kidney disease, stage 4 (severe): Secondary | ICD-10-CM | POA: Diagnosis present

## 2023-01-04 DIAGNOSIS — E2740 Unspecified adrenocortical insufficiency: Secondary | ICD-10-CM | POA: Diagnosis present

## 2023-01-04 DIAGNOSIS — E871 Hypo-osmolality and hyponatremia: Secondary | ICD-10-CM | POA: Diagnosis not present

## 2023-01-04 DIAGNOSIS — E1165 Type 2 diabetes mellitus with hyperglycemia: Secondary | ICD-10-CM | POA: Diagnosis present

## 2023-01-04 DIAGNOSIS — R6521 Severe sepsis with septic shock: Secondary | ICD-10-CM | POA: Diagnosis present

## 2023-01-04 DIAGNOSIS — I480 Paroxysmal atrial fibrillation: Secondary | ICD-10-CM | POA: Diagnosis present

## 2023-01-04 DIAGNOSIS — L8990 Pressure ulcer of unspecified site, unspecified stage: Secondary | ICD-10-CM | POA: Diagnosis not present

## 2023-01-04 DIAGNOSIS — Z66 Do not resuscitate: Secondary | ICD-10-CM | POA: Diagnosis present

## 2023-01-04 DIAGNOSIS — Z7189 Other specified counseling: Secondary | ICD-10-CM

## 2023-01-04 DIAGNOSIS — K7581 Nonalcoholic steatohepatitis (NASH): Secondary | ICD-10-CM | POA: Diagnosis not present

## 2023-01-04 DIAGNOSIS — D62 Acute posthemorrhagic anemia: Secondary | ICD-10-CM | POA: Diagnosis present

## 2023-01-04 DIAGNOSIS — R609 Edema, unspecified: Secondary | ICD-10-CM | POA: Diagnosis not present

## 2023-01-04 DIAGNOSIS — I129 Hypertensive chronic kidney disease with stage 1 through stage 4 chronic kidney disease, or unspecified chronic kidney disease: Secondary | ICD-10-CM | POA: Diagnosis present

## 2023-01-04 DIAGNOSIS — J90 Pleural effusion, not elsewhere classified: Secondary | ICD-10-CM | POA: Diagnosis present

## 2023-01-04 DIAGNOSIS — F32A Depression, unspecified: Secondary | ICD-10-CM | POA: Diagnosis present

## 2023-01-04 DIAGNOSIS — X58XXXA Exposure to other specified factors, initial encounter: Secondary | ICD-10-CM | POA: Diagnosis present

## 2023-01-04 DIAGNOSIS — N179 Acute kidney failure, unspecified: Secondary | ICD-10-CM | POA: Diagnosis present

## 2023-01-04 LAB — COMPREHENSIVE METABOLIC PANEL
ALT: 15 U/L (ref 0–44)
AST: 31 U/L (ref 15–41)
Albumin: 1.8 g/dL — ABNORMAL LOW (ref 3.5–5.0)
Alkaline Phosphatase: 65 U/L (ref 38–126)
Anion gap: 9 (ref 5–15)
BUN: 29 mg/dL — ABNORMAL HIGH (ref 8–23)
CO2: 18 mmol/L — ABNORMAL LOW (ref 22–32)
Calcium: 7.5 mg/dL — ABNORMAL LOW (ref 8.9–10.3)
Chloride: 109 mmol/L (ref 98–111)
Creatinine, Ser: 1.73 mg/dL — ABNORMAL HIGH (ref 0.44–1.00)
GFR, Estimated: 30 mL/min — ABNORMAL LOW (ref >60)
Glucose, Bld: 153 mg/dL — ABNORMAL HIGH (ref 70–99)
Potassium: 3.4 mmol/L — ABNORMAL LOW (ref 3.5–5.1)
Sodium: 136 mmol/L (ref 135–145)
Total Bilirubin: 1 mg/dL (ref 0.3–1.2)
Total Protein: 4 g/dL — ABNORMAL LOW (ref 6.5–8.1)

## 2023-01-04 LAB — CBC
HCT: 22.6 % — ABNORMAL LOW (ref 36.0–46.0)
Hemoglobin: 7.4 g/dL — ABNORMAL LOW (ref 12.0–15.0)
MCH: 33 pg (ref 26.0–34.0)
MCHC: 32.7 g/dL (ref 30.0–36.0)
MCV: 100.9 fL — ABNORMAL HIGH (ref 80.0–100.0)
Platelets: 201 10*3/uL (ref 150–400)
RBC: 2.24 MIL/uL — ABNORMAL LOW (ref 3.87–5.11)
RDW: 19.8 % — ABNORMAL HIGH (ref 11.5–15.5)
WBC: 7.3 10*3/uL (ref 4.0–10.5)
nRBC: 0 % (ref 0.0–0.2)

## 2023-01-04 LAB — BODY FLUID CELL COUNT WITH DIFFERENTIAL
Eos, Fluid: 0 %
Lymphs, Fluid: 23 %
Monocyte-Macrophage-Serous Fluid: 70 % (ref 50–90)
Neutrophil Count, Fluid: 7 % (ref 0–25)
Total Nucleated Cell Count, Fluid: 46 cu mm (ref 0–1000)

## 2023-01-04 LAB — HEMOGLOBIN AND HEMATOCRIT, BLOOD
HCT: 20.5 % — ABNORMAL LOW (ref 36.0–46.0)
Hemoglobin: 6.8 g/dL — CL (ref 12.0–15.0)

## 2023-01-04 LAB — MRSA NEXT GEN BY PCR, NASAL
MRSA by PCR Next Gen: NOT DETECTED
MRSA by PCR Next Gen: DETECTED — AB

## 2023-01-04 LAB — GLUCOSE, PLEURAL OR PERITONEAL FLUID: Glucose, Fluid: 182 mg/dL

## 2023-01-04 LAB — RESP PANEL BY RT-PCR (RSV, FLU A&B, COVID)  RVPGX2
Influenza A by PCR: NEGATIVE
Influenza B by PCR: NEGATIVE
Resp Syncytial Virus by PCR: NEGATIVE
SARS Coronavirus 2 by RT PCR: NEGATIVE

## 2023-01-04 LAB — PREPARE RBC (CROSSMATCH)

## 2023-01-04 LAB — HEMOGLOBIN A1C
Hgb A1c MFr Bld: 3.7 % — ABNORMAL LOW (ref 4.8–5.6)
Mean Plasma Glucose: 59.49 mg/dL

## 2023-01-04 LAB — PROCALCITONIN: Procalcitonin: 2.04 ng/mL

## 2023-01-04 LAB — ALBUMIN, PLEURAL OR PERITONEAL FLUID: Albumin, Fluid: <1.5 g/dL

## 2023-01-04 LAB — PHOSPHORUS: Phosphorus: 3 mg/dL (ref 2.5–4.6)

## 2023-01-04 LAB — LACTIC ACID, PLASMA
Lactic Acid, Venous: 2.6 mmol/L (ref 0.5–1.9)
Lactic Acid, Venous: 1.7 mmol/L (ref 0.5–1.9)

## 2023-01-04 LAB — PROTEIN, PLEURAL OR PERITONEAL FLUID: Total protein, fluid: <3 g/dL

## 2023-01-04 LAB — GLUCOSE, CAPILLARY
Glucose-Capillary: 202 mg/dL — ABNORMAL HIGH (ref 70–99)
Glucose-Capillary: 168 mg/dL — ABNORMAL HIGH (ref 70–99)
Glucose-Capillary: 134 mg/dL — ABNORMAL HIGH (ref 70–99)

## 2023-01-04 LAB — MAGNESIUM: Magnesium: 2 mg/dL (ref 1.7–2.4)

## 2023-01-04 LAB — CBG MONITORING, ED: Glucose-Capillary: 203 mg/dL — ABNORMAL HIGH (ref 70–99)

## 2023-01-04 LAB — TSH: TSH: 94.081 u[IU]/mL — ABNORMAL HIGH (ref 0.350–4.500)

## 2023-01-04 LAB — CORTISOL: Cortisol, Plasma: 26.8 ug/dL

## 2023-01-04 MED ORDER — ALBUMIN HUMAN 25 % IV SOLN
25.0000 g | Freq: Four times a day (QID) | INTRAVENOUS | Status: DC
  Filled 2023-01-04: qty 100

## 2023-01-04 MED ORDER — MIDODRINE HCL 5 MG PO TABS
10.0000 mg | ORAL_TABLET | Freq: Three times a day (TID) | ORAL | Status: DC
  Administered 2023-01-04: 10 mg via ORAL
  Filled 2023-01-04: qty 2

## 2023-01-04 MED ORDER — SODIUM CHLORIDE 0.9% IV SOLUTION: Freq: Once | INTRAVENOUS | Status: AC

## 2023-01-04 MED ORDER — MIDODRINE HCL 5 MG PO TABS
15.0000 mg | ORAL_TABLET | Freq: Three times a day (TID) | ORAL | Status: DC
  Administered 2023-01-04: 10 mg via ORAL
  Filled 2023-01-04: qty 3

## 2023-01-04 MED ORDER — SODIUM CHLORIDE 0.9 % IV SOLN
250.0000 mL | INTRAVENOUS | Status: DC
  Administered 2023-01-04 – 2023-01-05 (×2): 250 mL via INTRAVENOUS

## 2023-01-04 MED ORDER — NOREPINEPHRINE 4 MG/250ML-% IV SOLN: 2.0000 ug/min | INTRAVENOUS | Status: DC

## 2023-01-04 MED ORDER — DOCUSATE SODIUM 100 MG PO CAPS: 100.0000 mg | ORAL_CAPSULE | Freq: Two times a day (BID) | ORAL | Status: DC | PRN

## 2023-01-04 MED ORDER — PROCHLORPERAZINE EDISYLATE 10 MG/2ML IJ SOLN
5.0000 mg | Freq: Four times a day (QID) | INTRAMUSCULAR | Status: DC | PRN
  Administered 2023-01-04: 5 mg via INTRAVENOUS
  Filled 2023-01-04: qty 2

## 2023-01-04 MED ORDER — CHLORHEXIDINE GLUCONATE CLOTH 2 % EX PADS
6.0000 | MEDICATED_PAD | Freq: Every day | CUTANEOUS | Status: DC
  Administered 2023-01-04 – 2023-01-06 (×3): 6 via TOPICAL

## 2023-01-04 MED ORDER — VITAMIN B-12 1000 MCG PO TABS
500.0000 ug | ORAL_TABLET | Freq: Every day | ORAL | Status: DC
  Administered 2023-01-04 – 2023-01-07 (×4): 500 ug via ORAL
  Filled 2023-01-04 (×4): qty 1

## 2023-01-04 MED ORDER — IPRATROPIUM-ALBUTEROL 0.5-2.5 (3) MG/3ML IN SOLN: 3.0000 mL | RESPIRATORY_TRACT | Status: DC | PRN

## 2023-01-04 MED ORDER — ORAL CARE MOUTH RINSE: 15.0000 mL | OROMUCOSAL | Status: DC | PRN

## 2023-01-04 MED ORDER — METRONIDAZOLE 500 MG/100ML IV SOLN
500.0000 mg | Freq: Two times a day (BID) | INTRAVENOUS | Status: DC
  Administered 2023-01-04: 500 mg via INTRAVENOUS
  Filled 2023-01-04: qty 100

## 2023-01-04 MED ORDER — LEVOTHYROXINE SODIUM 100 MCG PO TABS
100.0000 ug | ORAL_TABLET | Freq: Every day | ORAL | Status: DC
  Administered 2023-01-04 – 2023-01-06 (×3): 100 ug via ORAL
  Filled 2023-01-04 (×3): qty 1

## 2023-01-04 MED ORDER — MIDODRINE HCL 5 MG PO TABS
20.0000 mg | ORAL_TABLET | ORAL | Status: AC
  Administered 2023-01-04: 20 mg via ORAL
  Filled 2023-01-04: qty 4

## 2023-01-04 MED ORDER — POLYETHYLENE GLYCOL 3350 17 G PO PACK: 17.0000 g | PACK | Freq: Every day | ORAL | Status: DC | PRN

## 2023-01-04 MED ORDER — INSULIN ASPART 100 UNIT/ML IJ SOLN
0.0000 [IU] | Freq: Three times a day (TID) | INTRAMUSCULAR | Status: DC
  Administered 2023-01-04 – 2023-01-05 (×3): 2 [IU] via SUBCUTANEOUS
  Administered 2023-01-05: 3 [IU] via SUBCUTANEOUS
  Administered 2023-01-06 (×2): 1 [IU] via SUBCUTANEOUS
  Administered 2023-01-06: 2 [IU] via SUBCUTANEOUS
  Administered 2023-01-07: 3 [IU] via SUBCUTANEOUS

## 2023-01-04 MED ORDER — ONDANSETRON HCL 4 MG/2ML IJ SOLN
4.0000 mg | Freq: Four times a day (QID) | INTRAMUSCULAR | Status: DC | PRN
  Administered 2023-01-04: 4 mg via INTRAVENOUS
  Filled 2023-01-04: qty 2

## 2023-01-04 MED ORDER — LACTATED RINGERS IV BOLUS
500.0000 mL | Freq: Once | INTRAVENOUS | Status: AC
  Administered 2023-01-04: 500 mL via INTRAVENOUS

## 2023-01-04 MED ORDER — INSULIN ASPART 100 UNIT/ML IJ SOLN: 0.0000 [IU] | Freq: Every day | INTRAMUSCULAR | Status: DC

## 2023-01-04 MED ORDER — PANTOPRAZOLE SODIUM 40 MG IV SOLR
40.0000 mg | Freq: Every day | INTRAVENOUS | Status: DC
  Administered 2023-01-04 – 2023-01-07 (×4): 40 mg via INTRAVENOUS
  Filled 2023-01-04 (×4): qty 10

## 2023-01-04 MED ORDER — NOREPINEPHRINE 4 MG/250ML-% IV SOLN
0.0000 ug/min | INTRAVENOUS | Status: DC
  Administered 2023-01-04: 14 ug/min via INTRAVENOUS
  Administered 2023-01-04: 15 ug/min via INTRAVENOUS
  Administered 2023-01-04: 2 ug/min via INTRAVENOUS
  Administered 2023-01-04: 10 ug/min via INTRAVENOUS
  Administered 2023-01-05 (×2): 17 ug/min via INTRAVENOUS
  Administered 2023-01-05 (×2): 14 ug/min via INTRAVENOUS
  Administered 2023-01-05: 17 ug/min via INTRAVENOUS
  Administered 2023-01-05 – 2023-01-06 (×2): 13 ug/min via INTRAVENOUS
  Administered 2023-01-06: 12 ug/min via INTRAVENOUS
  Administered 2023-01-06: 14 ug/min via INTRAVENOUS
  Administered 2023-01-06: 12 ug/min via INTRAVENOUS
  Administered 2023-01-07: 5 ug/min via INTRAVENOUS
  Administered 2023-01-07: 8 ug/min via INTRAVENOUS
  Administered 2023-01-07: 13 ug/min via INTRAVENOUS
  Filled 2023-01-04 (×17): qty 250

## 2023-01-04 MED ORDER — GERHARDT'S BUTT CREAM
TOPICAL_CREAM | Freq: Three times a day (TID) | CUTANEOUS | Status: DC
  Filled 2023-01-04: qty 1

## 2023-01-04 MED ORDER — ATORVASTATIN CALCIUM 40 MG PO TABS
20.0000 mg | ORAL_TABLET | Freq: Every day | ORAL | Status: DC
  Administered 2023-01-04 – 2023-01-07 (×4): 20 mg via ORAL
  Filled 2023-01-04: qty 2
  Filled 2023-01-04 (×3): qty 1

## 2023-01-04 MED ORDER — SODIUM CHLORIDE 0.9% IV SOLUTION: Freq: Once | INTRAVENOUS | Status: DC

## 2023-01-04 MED ORDER — LACTULOSE 10 GM/15ML PO SOLN
20.0000 g | Freq: Once | ORAL | Status: AC
  Administered 2023-01-04: 20 g via ORAL
  Filled 2023-01-04: qty 30

## 2023-01-04 NOTE — Progress Notes (Signed)
MD Sood ordered to hold giving albumin, plasma, and PRBC at this time. Will trend labs this evening after paracentesis.

## 2023-01-04 NOTE — Procedures (Signed)
Paracentesis Procedure Note  Tasha Roberts  [id number]  09-18-1941  Date:01/04/23  Time:3:10 PM   Provider Performing:Ashlen Kiger    Procedure: Paracentesis with imaging guidance (27741)  Indication(s) Ascites  Consent Risks of the procedure as well as the alternatives and risks of each were explained to the patient and/or caregiver.  Consent for the procedure was obtained and is signed in the bedside chart  Anesthesia Topical only with 1% lidocaine    Time Out Verified patient identification, verified procedure, site/side was marked, verified correct patient position, special equipment/implants available, medications/allergies/relevant history reviewed, required imaging and test results available.   Sterile Technique Maximal sterile technique including full sterile barrier drape, hand hygiene, sterile gown, sterile gloves, mask, hair covering, sterile ultrasound probe cover (if used).   Procedure Description Ultrasound used to identify appropriate peritoneal anatomy for placement and overlying skin marked.  Area of drainage cleaned and draped in sterile fashion. Lidocaine was used to anesthetize the skin and subcutaneous tissue.  1200 cc's of clear, yellow appearing fluid was drained. Catheter then removed and bandaid applied to site.   Complications/Tolerance None; patient tolerated the procedure well.   EBL none   Specimen(s) Peritoneal fluid for glucose, albumin, protein, cell count, gram stain and culture, and cytology  [name], MD [facility] Pager - [phone] or [phone] 01/04/2023, 3:11 PM

## 2023-01-04 NOTE — Consult Note (Signed)
[facility] Nurse Consult Note: Reason for Consult:Stage 2 PI to sacrum, RLE skin tear Wound type:pressure, trauma Pressure Injury POA: Yes Measurement: Stage 2 sacral PI: 1cm round x 0.1cm Pink, moist, scant serous RLE skin tear, full thickness: 4.25cm x 3cm x 0.2cm red, moist small serosanguinous Wound bed:As noted above Drainage (amount, consistency, odor) as noted above Periwound:intact Dressing procedure/placement/frequency:I have provided Nursing with guidance for the care of the above using turning and repositioning, Prevalon Boots, xeroform gauze as a wound contact layer for both the sacral PI and the RLE skin tear topped with dry gauze. The sacral PI will be secured with a silicone foam, the RLE will be topped with an ABD pad and secured with a few turns of Kerlix roll gauze/paper tape. Gerhart's Butt Cream will be used for buttocks, medial and posterior thighs TID and PRN soiling for irritant contact dermatitis..  [facility] nursing team will not follow, but will remain available to this patient, the nursing and medical teams.  Please re-consult if needed.  Thank you for inviting [place] to participate in this patient's Plan of Care.  Maudie Flakes, MSN, RN, CNS, [place], Serita Grammes, [employer], [employer] phone:  [phone]

## 2023-01-04 NOTE — Procedures (Signed)
Central Venous Catheter Insertion Procedure Note  Tasha Roberts  [id number]  19-Feb-1941  Date:01/04/23  Time:10:45 PM   Provider Performing:Ingris Pasquarella Duwayne Heck   Procedure: Insertion of Non-tunneled Central Venous [phone]) with US guidance ([id number])   Indication(s) Medication administration  Consent Risks of the procedure as well as the alternatives and risks of each were explained to the patient and/or caregiver.  Consent for the procedure was obtained and is signed in the bedside chart  Anesthesia Topical only with 1% lidocaine   Timeout Verified patient identification, verified procedure, site/side was marked, verified correct patient position, special equipment/implants available, medications/allergies/relevant history reviewed, required imaging and test results available.  Sterile Technique Maximal sterile technique including full sterile barrier drape, hand hygiene, sterile gown, sterile gloves, mask, hair covering, sterile ultrasound probe cover (if used).  Procedure Description Area of catheter insertion was cleaned with chlorhexidine and draped in sterile fashion.  With real-time ultrasound guidance a central venous catheter was placed into the left internal jugular vein. Nonpulsatile blood flow and easy flushing noted in all ports.  The catheter was sutured in place and sterile dressing applied.  Complications/Tolerance None; patient tolerated the procedure well. Chest X-ray is ordered to verify placement for internal jugular or subclavian cannulation. Confirmed placement with X ray - line is good to use.   EBL Minimal  Specimen(s) None

## 2023-01-04 NOTE — Consult Note (Signed)
NAME:  Tasha Roberts, MRN:  [id number], DOB:  10-17-1941, LOS: 0 ADMISSION DATE:  01/03/2023, CONSULTATION DATE:  1/28 REFERRING MD:  Dr. Nevada Crane, CHIEF COMPLAINT:  septic shock   History of Present Illness:  Patient is a 82 year old female with pertinent PMH of chronic hypotension on midodrine, NASH cirrhosis, A-fib on Eliquis, breast cancer s/p mastectomy/radiation 2012, CKD 4, Iron deficient anemia, Hypothyroidism, HTN, HLD presents to [facility] ED on 1/28 with SOB.  She has had poor po intake since Christmas from Nausea. Patient states a few weeks ago had UTI. Since then patient has been having worsening BLE and SOB.  Also states some abdominal swelling which is making it harder to breathe.  Denies any fever, cough. Patient began to have increasing weakness. EMS called 1/28 to transfer to [facility]. BP on arrival 81/51.   Upon arrival to [facility] ED on 1/28, patient afebrile and in no acute distress.  Initial BP 111/82.  Sats 100% on room air.  CTA chest/abdomen/pelvis showing no PE; moderate left pleural effusion; large volume free ascites; possible gastroenteritis; bilateral nonobstructive nephrolithiasis.  UA with small leukocytes; UC pending.  BC x 2 sent.  IV fluids given and started on broad-spectrum antibiotics.  Throughout the night patient became increasingly hypotensive and now requiring Levophed.  PCCM consulted.  Pertinent ED labs: LA 2.8 and 2.1.  Creat 1.68 (baseline 1.2-1.8).  Albumin 1.9.  Hgb 8.6.  BNP 259.  Ammonia 46.  Pertinent  Medical History   Past Medical History:  Diagnosis Date   Acute kidney injury superimposed on chronic kidney disease ([place]) 10/23/2022   Age related osteoporosis    Anemia due to blood loss    iron infusion's last one 03-25-2021 (pt bleeding post vulvar bx 02-11-2021, hospiatal admission 02-21-2021)   Anticoagulant long-term use    eliquis--- managed by pcp    Arthritis    hands   Bronchiectasis [facility])    pulmonology --- dr Halford Chessman   (04-01-2021  pt stated no  supplemental oxygen use since 01/ 2022 and no inhaler use,  stated checks O2 at home during the day 98-99%)   Cancer ([place]) 2013   Lumpectomy left breast   Cataract 2015   Chronic constipation    Clotting disorder ([place])    On eliquis   Coronary artery calcification    per lexiscan 08-17-2019 result in care everywhere   Depression, major, single episode, moderate ([place]) 12/05/2021   DM II (diabetes mellitus, type II), controlled ([place]) 08/27/2005   DOE (dyspnea on exertion)    04-01-2021  per pt only going up stairs    Gastroesophageal reflux disease 12/05/2021   History of diabetes mellitus, type II    04-01-2021  per pt no issues after weight loss, last taken medication approx 2017   History of Graves' disease 2017   s/p RAI   History of left breast cancer    dx 2013,  s/p left partial mastecotmy w/ node dissection 09-15-2012, low grade DCIS, no chemo, completed radiation 02/ 2014,  per pt no recurrence   History of respiratory syncytial virus (RSV) infection 09/2020   w/ hypoxia  , hospital admission 10/ 2021  and acute exacerbation bronchiectasis;  follow up post hospital pulmonogy dr Halford Chessman  note in epic   Hyperlipidemia    Hypertension    followed by pcp   (pt had lexiscan in care everywhere dated 08-17-2019 no ischemia , non-obstructive extensive calcification, ef 69%)   Hypothyroidism    Hypothyroidism, postradioiodine therapy  followed by pcp   Liver cirrhosis secondary to NASH (nonalcoholic steatohepatitis) ([place])    followed by dr Merrilee Jansky ([facility])---- dx 2004,  compensated ,  last liver bx (03/ 2015) fibrosis stage 3;  moderate portal hypertensive gastrophy   PAF (paroxysmal atrial fibrillation) ([place]) previous cardiologist lov note w/ Suann Larry PA ([facility] in [place]) dated 01-24-2020 in care everywhere;  (04-01-2021 per pt has appt w/ new cardiologist , dr j. branch 04-30-2021)   first dx 2020--  had event monitor/ stress test/ echo all  results in care everywhere (monitor 08-03-2019 short runs AFib conversion pause <2 secconds, rate controlled;  echo 12-14-2019 ef 60%, mild concentric LVH, G2DD, RSVP 50.80mHg)   Personal history of radiation therapy 2013   left breast cancer,  completed 02/ 2014   PONV (postoperative nausea and vomiting)    Sarcoidosis, lung ([facility] pulmonology--- dr sHalford Chessman  04-01-2021  per pt dx age [age]s, no issue since   Sepsis [place] to [place]TI [place] 03/27/2022   Thrombocytopenia ([facility]    hematology/ oncology--- dr s. kDelton Coombes  VIN III (vulvar intraepithelial neoplasia III)      Significant Hospital Events: Including procedures, antibiotic start and stop dates in addition to other pertinent events   1/28 septic shock on levo  Interim History / Subjective:  See above  Objective   Blood pressure (!) 82/40, pulse 68, temperature 98.1 F (36.7 C), temperature source Oral, resp. rate 16, height '5\' 3"'$  (1.6 m), weight 68 kg, SpO2 100 %.        Intake/Output Summary (Last 24 hours) at 01/04/2023 [id number] data filed at 01/03/2023 2141 Gross per 24 hour  Intake 1000 ml  Output --  Net 1000 ml   Filed Weights   01/03/23 2257  Weight: 68 kg    Examination: General:  ill appearing elderly female in NAD HEENT: MM pink/moist Neuro: Aox3; MAE CV: s1s2, afib rate 70s, no m/r/g PULM:  dim clear BS bilaterally; room air GI: soft, distended fluid filled; bsx4 active  Extremities: warm/dry, diffuse anasarca; LUE>RUE chronic from lymphedema; RLE w/ skin tear w/ bleeding and some blood filled blisters, gauze dressing in place; LLE swelling and bruising appreciated.  Resolved Hospital Problem list     Assessment & Plan:  Shock: likely sepsis Likely UTI Possible Gastroenteritis? -hx of recent cdif Plan: -Continue levo for MAP goal greater than 65 -IV team consult to establish secondary IV access -albumin ordered -cont midodrine -Continue broad-spectrum antibiotics -follow cultures -trend  LA -trend wbc/fever curve  Decompensated hepatic cirrhosis: INR 2.2, ammonia 46 Ascites Anasarca Hyperammonemia Plan: -IR consulted for paracentesis -Albumin as above -hold on diuresis given hypotension -lactulose; trend ammonia  Chronic Moderate left pleural effusion -CT chest minimally increased in size of moderate left pleural effusion from October 2023 Plan: -on room air -cont albumin; diuresis when able -trend CXR  A-fib on Eliquis Plan: -Hold home Eliquis given RLE bleeding; consider resuming AC later today  -Telemetry monitoring -Rate currently controlled -Hold Cardizem  RLE skin tear -from hitting leg on wheelchair Plan: -Gauze dressing in place -WOC consult  Iron deficient anemia -Portacath recently placed by IR for frequent iron transfusions Plan: -Trend CBC -Transfuse for hemoglobin less than 7  HTN HLD CAD Plan: -Hold home antihypertensives and hypotension -resume statin  Hypothyroidism Plan: -Check TSH -Resume Synthroid  DMT2 -Last A1c 5.6 Plan: -CBG monitoring  CKD 4 Plan: -Trend BMP / urinary output -Replace electrolytes as indicated -Avoid nephrotoxic agents,  ensure adequate renal perfusion  Protein calorie malnutrition Plan: -Dietitian consult  Best Practice (right click and "Reselect all SmartList Selections" daily)   Diet/type: clear liquids DVT prophylaxis: other; hold eliquis with RLE bleeding from skin tear; consider resuming later tomorrow when bleeding resolves GI prophylaxis: PPI Lines: N/A Foley:  N/A Code Status:  DNR Last date of multidisciplinary goals of care discussion [1/28 spoke with patient and granddaughter at bedside and updated. Patient and granddaughter states she is a DNR.]  Labs   CBC: Recent Labs  Lab 12/29/22 1122 01/03/23 1927  WBC 4.0 4.7  NEUTROABS 2.5 3.4  HGB 9.2* 8.6*  HCT 28.4* 25.6*  MCV 101.8* 100.8*  PLT 166 932    Basic Metabolic Panel: Recent Labs  Lab 12/29/22 1122  01/03/23 1927  NA 136 138  K 3.5 3.5  CL 107 109  CO2 23 19*  GLUCOSE 92 118*  BUN 27* 27*  CREATININE 1.26* 1.68*  CALCIUM 7.9* 7.7*   GFR: Estimated Creatinine Clearance: 25.1 mL/min (A) (by C-G formula based on SCr of 1.68 mg/dL (H)). Recent Labs  Lab 12/29/22 1122 01/03/23 1927 01/03/23 2146  WBC 4.0 4.7  --   LATICACIDVEN  --  2.8* 2.1*    Liver Function Tests: Recent Labs  Lab 12/29/22 1122 01/03/23 1927  AST 27 33  ALT 15 14  ALKPHOS 66 69  BILITOT 0.9 0.9  PROT 4.8* 4.3*  ALBUMIN 2.1* 1.9*   Recent Labs  Lab 01/03/23 1927  LIPASE 26   Recent Labs  Lab 01/03/23 1927  AMMONIA 46*    ABG No results found for: "PHART", "PCO2ART", "PO2ART", "HCO3", "TCO2", "ACIDBASEDEF", "O2SAT"   Coagulation Profile: Recent Labs  Lab 01/03/23 1927  INR 2.2*    Cardiac Enzymes: No results for input(s): "CKTOTAL", "CKMB", "CKMBINDEX", "TROPONINI" in the last 168 hours.  HbA1C: Hemoglobin A1C  Date/Time Value Ref Range Status  12/05/2021 01:55 PM 5.6 4.0 - 5.6 % Final   HbA1c, POC (controlled diabetic range)  Date/Time Value Ref Range Status  12/05/2021 01:55 PM 5.6 0.0 - 7.0 % Final   Hgb A1c MFr Bld  Date/Time Value Ref Range Status  04/07/2022 03:15 PM 5.4 4.8 - 5.6 % Final    Comment:             Prediabetes: 5.7 - 6.4          Diabetes: >6.4          Glycemic control for adults with diabetes: <7.0   10/02/2020 06:10 AM 5.8 (H) 4.8 - 5.6 % Final    Comment:    (NOTE)         Prediabetes: 5.7 - 6.4         Diabetes: >6.4         Glycemic control for adults with diabetes: <7.0     CBG: No results for input(s): "GLUCAP" in the last 168 hours.  Review of Systems:   Review of Systems  Constitutional:  Negative for fever.  Respiratory:  Positive for shortness of breath. Negative for cough, sputum production and wheezing.   Cardiovascular:  Positive for leg swelling. Negative for chest pain.  Gastrointestinal:  Positive for nausea and vomiting.  Negative for abdominal pain, constipation and diarrhea.     Past Medical History:  She,  has a past medical history of Acute kidney injury superimposed on chronic kidney disease ([place]) (10/23/2022), Age related osteoporosis, Anemia due to blood loss, Anticoagulant long-term use, Arthritis, Bronchiectasis ([place]), Cancer ([place]) (  2013), Cataract (2015), Chronic constipation, Clotting disorder ([place]), Coronary artery calcification, Depression, major, single episode, moderate ([place]) (12/05/2021), DM II (diabetes mellitus, type II), controlled ([place]) (08/27/2005), DOE (dyspnea on exertion), Gastroesophageal reflux disease (12/05/2021), History of diabetes mellitus, type II, History of Graves' disease (2017), History of left breast cancer, History of respiratory syncytial virus (RSV) infection (09/2020), Hyperlipidemia, Hypertension, Hypothyroidism, Hypothyroidism, postradioiodine therapy, Liver cirrhosis secondary to NASH (nonalcoholic steatohepatitis) ([place]), PAF (paroxysmal atrial fibrillation) ([place]) (previous cardiologist lov note w/ Suann Larry PA ([facility] in [place]) dated 01-24-2020 in care everywhere;  (04-01-2021 per pt has appt w/ new cardiologist , dr j. branch 04-30-2021)), Personal history of radiation therapy (2013), PONV (postoperative nausea and vomiting), Sarcoidosis, lung ([place]) (pulmonology--- dr Halford Chessman), Sepsis secondary to UTI [facility]) (03/27/2022), Thrombocytopenia ([place]), and VIN III (vulvar intraepithelial neoplasia III).   Surgical History:   Past Surgical History:  Procedure Laterality Date   ABDOMINAL HYSTERECTOMY  1989   W/  UNILATERAL SALPINGOOPHORECTOMY   APPENDECTOMY  1989   BREAST SURGERY  09/15/2012   CATARACT EXTRACTION W/ INTRAOCULAR LENS  IMPLANT, BILATERAL  2016   CHOLECYSTECTOMY OPEN  2951   CO2 LASER APPLICATION N/A 88/41/6606   Procedure: CO2 LASER APPLICATION OF THE VULVA;  Surgeon: Everitt Amber, MD;  Location: [place];  Service: Gynecology;   Laterality: N/A;   COLONOSCOPY WITH ESOPHAGOGASTRODUODENOSCOPY (EGD)  last one 08/ 2018  '@[facility]'$    EYE SURGERY  2015   Cataract removed both eyes   IR IMAGING GUIDED PORT INSERTION  12/25/2022   LUMBAR LAMINECTOMY  2009   PARTIAL MASTECTOMY WITH AXILLARY SENTINEL LYMPH NODE BIOPSY Left 09-15-2012   [place]   TUBAL LIGATION  08/08/1969     Social History:   reports that she has never smoked. She has never used smokeless tobacco. She reports that she does not currently use alcohol. She reports that she does not use drugs.   Family History:  Her family history includes Dementia in her mother; Hearing loss in her father; Heart disease in her brother and father; Hypertension in her mother; Stroke in her maternal grandfather; Vision loss in her mother.   Allergies Allergies  Allergen Reactions   Dicyclomine Other (See Comments)    Dry cough   Glucophage [Metformin] Diarrhea   Midodrine Hcl Nausea And Vomiting   Midol [Acetaminophen]    Losartan Other (See Comments)    Heartburn      Home Medications  Prior to Admission medications   Medication Sig Start Date End Date Taking? Authorizing Provider  apixaban (ELIQUIS) 2.5 MG TABS tablet Take 1 tablet (2.5 mg total) by mouth 2 (two) times daily. 10/20/22   Shahmehdi, Valeria Batman, MD  atorvastatin (LIPITOR) 20 MG tablet TAKE (1) TABLET BY MOUTH ONCE DAILY. Patient taking differently: Take 20 mg by mouth daily. 11/07/22   Lindell Spar, MD  Cholecalciferol 50 MCG (2000 UT) CAPS Take 1 capsule (2,000 Units total) by mouth daily. 03/30/22   Antonieta Pert, MD  diltiazem (CARDIZEM CD) 120 MG 24 hr capsule TAKE (1) CAPSULE BY MOUTH ONCE DAILY. Patient taking differently: Take 120 mg by mouth daily. 05/16/22   Arnoldo Lenis, MD  feeding supplement (ENSURE ENLIVE / ENSURE PLUS) LIQD Take 237 mLs by mouth 2 (two) times daily between meals. 07/28/22   Manuella Ghazi, Pratik D, DO  fidaxomicin (DIFICID) 200 MG TABS tablet Take 1 tablet (200 mg total) by mouth 2  (two) times daily for 10 days Patient not taking: Reported on  12/25/2022 10/25/22   Roxan Hockey, MD  levothyroxine (SYNTHROID) 100 MCG tablet Take 1 tablet (100 mcg total) by mouth daily before breakfast. 06/09/22   Lindell Spar, MD  midodrine (PROAMATINE) 5 MG tablet Take 1 tablet (5 mg total) by mouth 3 (three) times daily with meals. Patient not taking: Reported on 12/25/2022 10/25/22   Roxan Hockey, MD  Misc. Devices [place] Lift chair - 1. 11/19/22   Lindell Spar, MD  ondansetron (ZOFRAN-ODT) 4 MG disintegrating tablet Take 1 tablet (4 mg total) by mouth every 8 (eight) hours as needed for nausea or vomiting. 12/05/22   Lindell Spar, MD  tolterodine (DETROL LA) 4 MG 24 hr capsule Take 1 capsule (4 mg total) by mouth daily. Patient not taking: Reported on 12/25/2022 08/28/22   Cleon Gustin, MD  vitamin B-12 (CYANOCOBALAMIN) 500 MCG tablet Take 500 mcg by mouth daily.    [provider]     Critical care time: 45 minutes    JD Rexene Agent [facility] 01/04/2023, 3:11 AM  Please see [email or web address] for pager details.  From 7A-7P if no response, please call [phone]. After hours, please call ELink [phone].

## 2023-01-04 NOTE — ED Notes (Signed)
Spoke to patient's [name] to update her on patient transfer. [name] has asked to be updated by phone calls d/t having her own medical issues and not being able to easily leave the house to visit the hospital.

## 2023-01-04 NOTE — Consult Note (Signed)
Consultation Note Date: 01/04/2023   Patient Name: Tasha Roberts  DOB: 02/01/1941  MRN: [id number]  Age / Sex: 82 y.o., female  PCP: Lindell Spar, MD Referring Physician: [name], MD  Reason for Consultation: Establishing goals of care  HPI/Patient Profile: 82 y.o. female  with past medical history of chronic hypotension on midodrine, NASH with cirrhosis, A-fib on Eliquis, breast cancer status postmastectomy and radiation in 2012, stage IV CKD, iron deficiency anemia, hypothyroidism, hypertension, dyslipidemia, osteoporosis, depression, type 2 diabetes, GERD, Graves' disease admitted on 01/03/2023 with weakness shortness of breath [place] cirrhosis with ascites.   Clinical Assessment and Goals of Care: Patient admitted to critical care service, CT scan of the chest and abdomen pelvis on 1-27 showing atherosclerosis, calcified mitral ring, moderate left effusion, cirrhotic liver, calcified granulomas right lobe of liver and spleen, absent gallbladder and atrophic pancreas, large volume ascites. Patient remains admitted to critical care medicine service for hypotension, NASH with cirrhosis and ascites, possible sepsis from urinary source, chronic left pleural effusion.  Patient had trauma after hitting her leg on a wheelchair and had right lower leg skin tear. Eliquis is being held because of oozing from leg wound, she is to undergo paracentesis and ascitic fluid analysis, she is on pressors and midodrine. Palliative medicine consultation has been requested for ongoing goals of care discussions. Patient is awake alert resting in bed.  She appears weak and frail.  She is able to answer a few questions appropriately. Call placed and discussed with [name] at [phone].  Reviewed about patient's baseline status, current scope of hospitalization.  Patient's baseline is such that she is mostly bedbound or  wheelchair-bound.  She has some home care and home-based PT however she has not been able to do much physical therapy in the home setting.  She has had gradual progressive decline. Discussed frankly but compassionately with patient's [name] regarding the serious nature of the patient's illness.  Discussed about how this current hospitalization is going.  Discussed about time trial of current interventions for the next 48 to 72 hours and then monitoring patient's overall disease trajectory of illness so as to guide further decision making.  Introduced hospice philosophy of care-see below.   HCPOA    SUMMARY OF RECOMMENDATIONS   DNR Continue with current scope of hospitalization, pressors, antibiotics, paracentesis.  Call placed and discussed with [name] regarding the patient's current condition and also discussed broad goals of care with her. Introduced hospice philosophy of care and started discussion about differences between home based hospice versus home health care/PT on discharge. Daughter wishes to continue current hospitalization and to monitor patient's overall disease trajectory of illness before making further decisions, PMT to follow along.  Thank you for the consult.   Code Status/Advance Care Planning: DNR   Symptom Management:     Palliative Prophylaxis:  Delirium Protocol    Psycho-social/Spiritual:  Desire for further Chaplaincy support:yes Additional Recommendations: Caregiving  Support/Resources  Prognosis:  Unable to determine  Discharge Planning: To Be Determined  Primary Diagnoses: Present on Admission:  Decompensated hepatic cirrhosis ([place])  Septic shock ([place])   I have reviewed the medical record, interviewed the patient and family, and examined the patient. The following aspects are pertinent.  Past Medical History:  Diagnosis Date   Acute kidney injury superimposed on chronic kidney disease ([place]) 10/23/2022   Age related  osteoporosis    Anemia due to blood loss    iron infusion's last one 03-25-2021 (pt bleeding post vulvar bx 02-11-2021, hospiatal admission 02-21-2021)   Anticoagulant long-term use    eliquis--- managed by pcp    Arthritis    hands   Bronchiectasis [facility])    pulmonology --- dr Halford Chessman   (04-01-2021  pt stated no supplemental oxygen use since 01/ 2022 and no inhaler use,  stated checks O2 at home during the day 98-99%)   Cancer ([place]) 2013   Lumpectomy left breast   Cataract 2015   Chronic constipation    Clotting disorder ([place])    On eliquis   Coronary artery calcification    per lexiscan 08-17-2019 result in care everywhere   Depression, major, single episode, moderate ([place]) 12/05/2021   DM II (diabetes mellitus, type II), controlled ([place]) 08/27/2005   DOE (dyspnea on exertion)    04-01-2021  per pt only going up stairs    Gastroesophageal reflux disease 12/05/2021   History of diabetes mellitus, type II    04-01-2021  per pt no issues after weight loss, last taken medication approx 2017   History of Graves' disease 2017   s/p RAI   History of left breast cancer    dx 2013,  s/p left partial mastecotmy w/ node dissection 09-15-2012, low grade DCIS, no chemo, completed radiation 02/ 2014,  per pt no recurrence   History of respiratory syncytial virus (RSV) infection 09/2020   w/ hypoxia  , hospital admission 10/ 2021  and acute exacerbation bronchiectasis;  follow up post hospital pulmonogy dr Halford Chessman  note in epic   Hyperlipidemia    Hypertension    followed by pcp   (pt had lexiscan in care everywhere dated 08-17-2019 no ischemia , non-obstructive extensive calcification, ef 69%)   Hypothyroidism    Hypothyroidism, postradioiodine therapy    followed by pcp   Liver cirrhosis secondary to NASH (nonalcoholic steatohepatitis) ([place])    followed by dr Merrilee Jansky ([facility])---- dx 2004,  compensated ,  last liver bx (03/ 2015) fibrosis stage 3;  moderate portal hypertensive  gastrophy   PAF (paroxysmal atrial fibrillation) ([place]) previous cardiologist lov note w/ Suann Larry PA ([facility] in [place]) dated 01-24-2020 in care everywhere;  (04-01-2021 per pt has appt w/ new cardiologist , dr j. branch 04-30-2021)   first dx 2020--  had event monitor/ stress test/ echo all results in care everywhere (monitor 08-03-2019 short runs AFib conversion pause <2 secconds, rate controlled;  echo 12-14-2019 ef 60%, mild concentric LVH, G2DD, RSVP 50.27mHg)   Personal history of radiation therapy 2013   left breast cancer,  completed 02/ 2014   PONV (postoperative nausea and vomiting)    Sarcoidosis, lung ([facility] pulmonology--- dr sHalford Chessman  04-01-2021  per pt dx age 2733s, no issue since   Sepsis secondary to UTI ([place] 03/27/2022   Thrombocytopenia ([facility]    hematology/ oncology--- dr s. kDelton Coombes  VIN III (vulvar intraepithelial neoplasia III)    Social History   Socioeconomic History   Marital status: Widowed    Spouse name: Not on  file   Number of children: 3   Years of education: Not on file   Highest education level: Not on file  Occupational History   Occupation: retired  Tobacco Use   Smoking status: Never   Smokeless tobacco: Never  Vaping Use   Vaping Use: Never used  Substance and Sexual Activity   Alcohol use: Not Currently   Drug use: Never   Sexual activity: Not Currently    Birth control/protection: Post-menopausal  Other Topics Concern   Not on file  Social History Narrative   Not on file   Social Determinants of Health   Financial Resource Strain: Low Risk  (08/18/2022)   Overall Financial Resource Strain (CARDIA)    Difficulty of Paying Living Expenses: Not hard at all  Food Insecurity: No Food Insecurity (10/23/2022)   Hunger Vital Sign    Worried About Running Out of Food in the Last Year: Never true    [place] in the Last Year: Never true  Transportation Needs: No Transportation Needs (10/23/2022)   PRAPARE -  Hydrologist (Medical): No    Lack of Transportation (Non-Medical): No  Physical Activity: Insufficiently Active (08/18/2022)   Exercise Vital Sign    Days of Exercise per Week: 2 days    Minutes of Exercise per Session: 30 min  Stress: No Stress Concern Present (08/18/2022)   [place]    Feeling of Stress : Not at all  Social Connections: Moderately Isolated (08/18/2022)   Social Connection and Isolation Panel [NHANES]    Frequency of Communication with Friends and Family: More than three times a week    Frequency of Social Gatherings with Friends and Family: More than three times a week    Attends Religious Services: More than 4 times per year    Active Member of [employer] or Organizations: No    Attends Archivist Meetings: Never    Marital Status: Widowed   Family History  Problem Relation Age of Onset   Hypertension Mother    Dementia Mother    Vision loss Mother    Heart disease Father    Hearing loss Father    Stroke Maternal Grandfather    Heart disease Brother    Scheduled Meds:  atorvastatin  20 mg Oral Daily   Chlorhexidine Gluconate Cloth  6 each Topical Daily   cyanocobalamin  500 mcg Oral Daily   insulin aspart  0-5 Units Subcutaneous QHS   insulin aspart  0-9 Units Subcutaneous TID WC   levothyroxine  100 mcg Oral QAC breakfast   midodrine  15 mg Oral TID WC   pantoprazole (PROTONIX) IV  40 mg Intravenous Daily   Continuous Infusions:  sodium chloride 10 mL/hr at 01/04/23 1200   ceFEPime (MAXIPIME) IV     norepinephrine (LEVOPHED) Adult infusion 13 mcg/min (01/04/23 1200)   PRN Meds:.docusate sodium, ipratropium-albuterol, ondansetron (ZOFRAN) IV, mouth rinse, polyethylene glycol Medications Prior to Admission:  Prior to Admission medications   Medication Sig Start Date End Date Taking? Authorizing Provider  apixaban (ELIQUIS) 2.5 MG TABS tablet Take 1  tablet (2.5 mg total) by mouth 2 (two) times daily. 10/20/22   Shahmehdi, Valeria Batman, MD  atorvastatin (LIPITOR) 20 MG tablet TAKE (1) TABLET BY MOUTH ONCE DAILY. Patient taking differently: Take 20 mg by mouth daily. 11/07/22   Lindell Spar, MD  Cholecalciferol 50 MCG (2000 UT) CAPS Take 1 capsule (2,000 Units  total) by mouth daily. 03/30/22   Antonieta Pert, MD  diltiazem (CARDIZEM CD) 120 MG 24 hr capsule TAKE (1) CAPSULE BY MOUTH ONCE DAILY. Patient taking differently: Take 120 mg by mouth daily. 05/16/22   Arnoldo Lenis, MD  feeding supplement (ENSURE ENLIVE / ENSURE PLUS) LIQD Take 237 mLs by mouth 2 (two) times daily between meals. 07/28/22   Manuella Ghazi, Pratik D, DO  levothyroxine (SYNTHROID) 100 MCG tablet Take 1 tablet (100 mcg total) by mouth daily before breakfast. 06/09/22   Lindell Spar, MD  midodrine (PROAMATINE) 5 MG tablet Take 1 tablet (5 mg total) by mouth 3 (three) times daily with meals. Patient not taking: Reported on 12/25/2022 10/25/22   Roxan Hockey, MD  Misc. Devices [place] Lift chair - 1. 11/19/22   Lindell Spar, MD  ondansetron (ZOFRAN-ODT) 4 MG disintegrating tablet Take 1 tablet (4 mg total) by mouth every 8 (eight) hours as needed for nausea or vomiting. 12/05/22   Lindell Spar, MD  vitamin B-12 (CYANOCOBALAMIN) 500 MCG tablet Take 500 mcg by mouth daily.    [provider]   Allergies  Allergen Reactions   Dicyclomine Other (See Comments)    Dry cough   Glucophage [Metformin] Diarrhea   Midodrine Hcl Nausea And Vomiting   Midol [Acetaminophen]    Losartan Other (See Comments)    Heartburn    Review of Systems Weakness  Physical Exam Frail elderly lady sitting up in bed Generalized edema Abdomen distended Multiple areas of ecchymosis and Awake alert oriented answers questions appropriately Monitor noted Regular work of breathing  Vital Signs: BP (!) 103/42   Pulse 63   Temp (!) 97.5 F (36.4 C) (Axillary)   Resp 11   Ht '5\' 3"'$  (1.6 m)    Wt 68 kg   SpO2 100%   BMI 26.56 kg/m  Pain Scale: PAINAD   Pain Score: 0-No pain   SpO2: SpO2: 100 % O2 Device:SpO2: 100 % O2 Flow Rate: .O2 Flow Rate (L/min): 2 L/min  IO: Intake/output summary:  Intake/Output Summary (Last 24 hours) at 01/04/2023 1253 Last data filed at 01/04/2023 1200 Gross per 24 hour  Intake 1744.6 ml  Output 200 ml  Net 1544.6 ml    LBM: Last BM Date : 01/02/23 Baseline Weight: Weight: 68 kg Most recent weight: Weight: 68 kg     Palliative Assessment/Data:   PPS 50%  Time In:  1300 Time Out:  1400 Time Total:  60  Greater than 50%  of this time was spent counseling and coordinating care related to the above assessment and plan.  Signed by: Loistine Chance, MD   Please contact Palliative Medicine Team phone at [phone] for questions and concerns.  For individual provider: See Shea Evans

## 2023-01-04 NOTE — Progress Notes (Addendum)
[facility] Progress Note Patient Name: Sydnie Sigmund DOB: 05/11/1941 MRN: [id number]   Date of Service  01/04/2023  HPI/Events of Note  Hg 6.8. Reported no current match in blood bank. Need Type and Screen Access is limited by only PAC with levo infusing  eICU Interventions  Order T&S and PBRC x 1. Will need obtain access if levo is unable to be weaned off  LR 500 cc bolus now     Intervention Category Intermediate Interventions: Bleeding - evaluation and treatment with blood products  Cherie Lasalle Rodman Pickle 01/04/2023, 8:04 PM

## 2023-01-04 NOTE — Progress Notes (Signed)
NAME:  Tasha Roberts, MRN:  [id number], DOB:  1940/12/22, LOS: 0 ADMISSION DATE:  01/03/2023, CONSULTATION DATE:  01/04/2023 REFERRING MD:  Dr. Nevada Crane, [facility], CHIEF COMPLAINT:  Hypotension   History of Present Illness:  82 yo female with NASH with cirrhosis and ascites presented to the ER with weakness and dyspnea.  Found to have low blood pressure.  Concern for sepsis and she was started on Abx, and pressors.    Pertinent  Medical History  Chronic hypotension on midodrine, MASH with cirrhosis, A fib on eliquis, Breast cancer s/p mastectomy and XRT in 2012, CKD 4, IDA, Hypothyroidism, HTN, HLD, Osteoporosis, Bronchiectasis, Depression, DM type 2, GERD, Grave's disease, HLD, Sarcoidosis  Significant Hospital Events: Including procedures, antibiotic start and stop dates in addition to other pertinent events   1/27 Admit, start levophed 1/28 transfer from [facility] ER to [facility] ICU  Studies:  CT angio chest 1/27 >> atherosclerosis, calcified mitral ring, 3 vessel coronary calcification, moderate Lt effusion w/o change from 09/2022 CT abd/pelvis 1/27 >> cirrhotic liver, calcified granulomas in Rt lobe of liver and spleen, absent GB, atrophic pancreas, 2 mm Lt upper pole kidney stone, small hiatal hernia, colonic diverticula, retroperitoneal varices from SMV, atherosclerosis, large volume ascites; new bilateral perianal complex ovoid collections with serpiginous densities in the right-sided collection and linear densities in the left-sided collection  Interim History / Subjective:  Breathing okay.  Denies chest pain or abdominal pain.  Objective   Blood pressure (!) 89/34, pulse 68, temperature 97.6 F (36.4 C), temperature source Oral, resp. rate 20, height '5\' 3"'$  (1.6 m), weight 68 kg, SpO2 100 %.        Intake/Output Summary (Last 24 hours) at 01/04/2023 1129 Last data filed at 01/04/2023 0830 Gross per 24 hour  Intake 1337 ml  Output 200 ml  Net 1137 ml   Filed Weights   01/03/23 2257  Weight: 68  kg    Examination:  General - alert Eyes - pupils reactive ENT - no sinus tenderness, no stridor Cardiac - regular rate/rhythm, no murmur Chest - decreased BS at bases Lt > Rt Abdomen - soft, distended with positive fluid wave Extremities - 3+ edema Skin - multiple areas of ecchymosis Neuro - pleasant, follows commands appropriately  Resolved Hospital Problem list   Lactic acidosis  Assessment & Plan:   Hypotension. - difficult to determine if there is an acute change or if this is related to progression of her chronic medical conditions - continue pressors to keep MAP > 55, SBP > 85 - continue midodrine  NASH with cirrhosis and ascites. - [facility] at [facility] liver clinic - will arrange for paracentesis to send for fluid analysis  Possible sepsis from urinary source. - day 2 of cefepime - d/c flagyl, vancomycin - f/u blood culture, urine culture from 1/27  Hypothyroidism. - TSH 94.081 from 01/04/23 ? If lab error - repeat TSH level pending - continue synthroid  Chronic left pleural effusion. - defer thoracentesis at this time  History of atrial fibrillation. Hx of CAD, HLD. - had oozing from leg wound >> hold eliquis  Rt lower leg skin tear. - from trauma after hitting her leg on a wheelchair - wound care  IDA. - f/u CBC - transfuse for Hb < 7  DM type 2 poorly controlled with hyperglycemia. - SSI  CKD 4. Hypokalemia. Non gap metabolic acidosis. - f/u BMET - monitor urine outpt  Moderate protein calorie malnutrition. - dietary assessment  Remote hx of sarcoidosis with  bronchiectasis. - bronchial hygiene  Goals of care. - palliative care consulted  Best Practice (right click and "Reselect all SmartList Selections" daily)   Diet/type: Regular consistency (see orders) DVT prophylaxis: other GI prophylaxis: PPI Lines: N/A Foley:  N/A Code Status:  DNR Last date of multidisciplinary goals of care discussion '[x]'$   Labs       Latest Ref Rng &  Units 01/04/2023    4:05 AM 01/03/2023    7:27 PM 12/29/2022   11:22 AM  CMP  Glucose 70 - 99 mg/dL 153  118  92   BUN 8 - 23 mg/dL '29  27  27   '$ Creatinine 0.44 - 1.00 mg/dL 1.73  1.68  1.26   Sodium 135 - 145 mmol/L 136  138  136   Potassium 3.5 - 5.1 mmol/L 3.4  3.5  3.5   Chloride 98 - 111 mmol/L 109  109  107   CO2 22 - 32 mmol/L '18  19  23   '$ Calcium 8.9 - 10.3 mg/dL 7.5  7.7  7.9   Total Protein 6.5 - 8.1 g/dL 4.0  4.3  4.8   Total Bilirubin 0.3 - 1.2 mg/dL 1.0  0.9  0.9   Alkaline Phos 38 - 126 U/L 65  69  66   AST 15 - 41 U/L 31  33  27   ALT 0 - 44 U/L '15  14  15        '$ Latest Ref Rng & Units 01/04/2023    4:05 AM 01/03/2023    7:27 PM 12/29/2022   11:22 AM  CBC  WBC 4.0 - 10.5 K/uL 7.3  4.7  4.0   Hemoglobin 12.0 - 15.0 g/dL 7.4  8.6  9.2   Hematocrit 36.0 - 46.0 % 22.6  25.6  28.4   Platelets 150 - 400 K/uL 201  171  166     ABG No results found for: "PHART", "PCO2ART", "PO2ART", "HCO3", "TCO2", "ACIDBASEDEF", "O2SAT"  CBG (last 3)  Recent Labs    01/04/23 0807  GLUCAP 203*    Critical care time: 48 minutes  [name], MD [place] Pager - [phone] or [phone] 01/04/2023, 12:03 PM

## 2023-01-04 NOTE — Progress Notes (Signed)
The events of overnight were reviewed and patient was seen and examined this morning.  Blood pressure still remain marginal on Levophed.  Patient leg is also bleeding and so I spoke with critical care and they recommended consulting palliative care for goals of care discussion, transfusing 1 unit of PRBCs as well as 1 unit of FFP.  Patient is also set to have a paracentesis at some point.  Given that patient is going to ICU now given her decompensation Critical Care is now going to take over and manage so TRH will sign off the case.  This was discussed with the critical care nurse practitioner Kennieth Rad.  Once patient is stable to be transferred out of the ICU and off of pressors, TRH will reassume care of the patient.  Given the lack of ICU beds at [facility] the patient is being transferred to [facility] for further care and workup.

## 2023-01-04 NOTE — H&P (Addendum)
History and Physical  Tasha Roberts HQP:[id number] DOB: Apr 24, 1941 DOA: 01/03/2023  Referring physician: Dr. Philip Aspen, [place] PCP: Lindell Spar, MD  Outpatient Specialists: GI, cardiology, medical oncology, Patient coming from: Home  Chief Complaint: Shortness of breath, swelling, fatigue  HPI: Tasha Roberts is a 82 y.o. female with medical history significant for Karlene Lineman cirrhosis, acquired thrombophilia, paroxysmal A-fib on Eliquis, iron deficiency anemia due to chronic blood loss, hemorrhoids with bleeding, Graves' disease, hyperlipidemia, CKD 3B, chronic hypotension on midodrine, who presented to [facility] ED from home via EMS due to progressive dyspnea with minimal exertion for the past 3 weeks.  Associated with generalized weakness and swelling.  States is getting harder to breathe with her abdominal distention.  No subjective reported fevers or chills.  EMS was activated.  She was brought into the ED for further evaluation.  In the ED, the patient was noted to be hypotensive with systolic blood pressure less than 90 and MAP in the 50s.  Hemoglobin 8.6 from 9.2, 5 days ago, admits to occasional blood on toilet paper from hemorrhoids after wiping.  UA positive for pyuria.  Cultures were obtained and she was started on cefepime.  On physical exam severely volume overloaded.  CTA chest negative for pulmonary embolism and positive for moderate left pleural effusion.  CT abdomen and pelvis with increased generalized anasarca.  The patient was admitted by [facility], hospitalist service.  ED Course: Tmax 98.1.  BP 83/34, pulse 63, respiratory rate 18, saturation 100% on room air.  Lab studies remarkable for serum bicarb 19, BUN 27, creatinine 1.68 from baseline of 1.26.  Ammonia 46.  GFR 31.  BNP 259.  Hemoglobin 8.6, MCV 100.8  Review of Systems: Review of systems as noted in the HPI. All other systems reviewed and are negative.   Past Medical History:  Diagnosis Date   Acute kidney injury superimposed on chronic  kidney disease ([place]) 10/23/2022   Age related osteoporosis    Anemia due to blood loss    iron infusion's last one 03-25-2021 (pt bleeding post vulvar bx 02-11-2021, hospiatal admission 02-21-2021)   Anticoagulant long-term use    eliquis--- managed by pcp    Arthritis    hands   Bronchiectasis [facility])    pulmonology --- dr Halford Chessman   (04-01-2021  pt stated no supplemental oxygen use since 01/ 2022 and no inhaler use,  stated checks O2 at home during the day 98-99%)   Cancer ([place]) 2013   Lumpectomy left breast   Cataract 2015   Chronic constipation    Clotting disorder ([place])    On eliquis   Coronary artery calcification    per lexiscan 08-17-2019 result in care everywhere   Depression, major, single episode, moderate ([place]) 12/05/2021   DM II (diabetes mellitus, type II), controlled ([place]) 08/27/2005   DOE (dyspnea on exertion)    04-01-2021  per pt only going up stairs    Gastroesophageal reflux disease 12/05/2021   History of diabetes mellitus, type II    04-01-2021  per pt no issues after weight loss, last taken medication approx 2017   History of Graves' disease 2017   s/p RAI   History of left breast cancer    dx 2013,  s/p left partial mastecotmy w/ node dissection 09-15-2012, low grade DCIS, no chemo, completed radiation 02/ 2014,  per pt no recurrence   History of respiratory syncytial virus (RSV) infection 09/2020   w/ hypoxia  , hospital admission 10/ 2021  and acute exacerbation bronchiectasis;  follow up post hospital pulmonogy dr Halford Chessman  note in epic   Hyperlipidemia    Hypertension    followed by pcp   (pt had lexiscan in care everywhere dated 08-17-2019 no ischemia , non-obstructive extensive calcification, ef 69%)   Hypothyroidism    Hypothyroidism, postradioiodine therapy    followed by pcp   Liver cirrhosis secondary to NASH (nonalcoholic steatohepatitis) ([place])    followed by dr Merrilee Jansky ([facility])---- dx 2004,  compensated ,  last liver bx (03/ 2015)  fibrosis stage 3;  moderate portal hypertensive gastrophy   PAF (paroxysmal atrial fibrillation) ([place]) previous cardiologist lov note w/ Suann Larry PA ([facility] in [place]) dated 01-24-2020 in care everywhere;  (04-01-2021 per pt has appt w/ new cardiologist , dr j. branch 04-30-2021)   first dx 2020--  had event monitor/ stress test/ echo all results in care everywhere (monitor 08-03-2019 short runs AFib conversion pause <2 secconds, rate controlled;  echo 12-14-2019 ef 60%, mild concentric LVH, G2DD, RSVP 50.16mHg)   Personal history of radiation therapy 2013   left breast cancer,  completed 02/ 2014   PONV (postoperative nausea and vomiting)    Sarcoidosis, lung ([facility] pulmonology--- dr sHalford Chessman  04-01-2021  per pt dx age [age]s, no issue since   Sepsis secondary to UTI ([place] 03/27/2022   Thrombocytopenia ([facility]    hematology/ oncology--- dr s. kDelton Coombes  VIN III (vulvar intraepithelial neoplasia III)    Past Surgical History:  Procedure Laterality Date   [place]  BREAST SURGERY  09/15/2012   CATARACT EXTRACTION W/ INTRAOCULAR LENS  IMPLANT, BILATERAL  2016   CHOLECYSTECTOMY OPEN  13329  CO2 LASER APPLICATION N/A [id number]/[id number]  Procedure: CO2 LASER APPLICATION OF THE VULVA;  Surgeon: REveritt Amber MD;  [place]  Service: Gynecology;  Laterality: N/A;   COLONOSCOPY WITH ESOPHAGOGASTRODUODENOSCOPY (EGD)  last one 08/ 2018  '@[facility]'$    EYE SURGERY  2015   Cataract removed both eyes   IR IMAGING GUIDED PORT INSERTION  12/25/2022   LUMBAR LAMINECTOMY  2009   PARTIAL MASTECTOMY WITH AXILLARY SENTINEL LYMPH NODE BIOPSY Left 09-15-2012   [place]  TUBAL LIGATION  08/08/1969    Social History:  reports that she has never smoked. She has never used smokeless tobacco. She reports that she does not currently use alcohol. She reports that she does not use drugs.   Allergies   Allergen Reactions   Dicyclomine Other (See Comments)    Dry cough   Glucophage [Metformin] Diarrhea   Midodrine Hcl Nausea And Vomiting   Midol [Acetaminophen]    Losartan Other (See Comments)    Heartburn     Family History  Problem Relation Age of Onset   Hypertension Mother    Dementia Mother    Vision loss Mother    Heart disease Father    Hearing loss Father    Stroke Maternal Grandfather    Heart disease Brother       Prior to Admission medications   Medication Sig Start Date End Date Taking? Authorizing Provider  apixaban (ELIQUIS) 2.5 MG TABS tablet Take 1 tablet (2.5 mg total) by mouth 2 (two) times daily. 10/20/22   Shahmehdi, SValeria Batman MD  atorvastatin (LIPITOR) 20 MG tablet TAKE (1) TABLET BY MOUTH ONCE DAILY. Patient taking differently: Take 20 mg by mouth daily. 11/07/22   PPosey Pronto  Colin Broach, MD  Cholecalciferol 50 MCG (2000 UT) CAPS Take 1 capsule (2,000 Units total) by mouth daily. 03/30/22   Antonieta Pert, MD  diltiazem (CARDIZEM CD) 120 MG 24 hr capsule TAKE (1) CAPSULE BY MOUTH ONCE DAILY. Patient taking differently: Take 120 mg by mouth daily. 05/16/22   Arnoldo Lenis, MD  feeding supplement (ENSURE ENLIVE / ENSURE PLUS) LIQD Take 237 mLs by mouth 2 (two) times daily between meals. 07/28/22   Manuella Ghazi, Pratik D, DO  fidaxomicin (DIFICID) 200 MG TABS tablet Take 1 tablet (200 mg total) by mouth 2 (two) times daily for 10 days Patient not taking: Reported on 12/25/2022 10/25/22   Roxan Hockey, MD  levothyroxine (SYNTHROID) 100 MCG tablet Take 1 tablet (100 mcg total) by mouth daily before breakfast. 06/09/22   Lindell Spar, MD  midodrine (PROAMATINE) 5 MG tablet Take 1 tablet (5 mg total) by mouth 3 (three) times daily with meals. Patient not taking: Reported on 12/25/2022 10/25/22   Roxan Hockey, MD  Misc. Devices [place] Lift chair - 1. 11/19/22   Lindell Spar, MD  ondansetron (ZOFRAN-ODT) 4 MG disintegrating tablet Take 1 tablet (4 mg total) by mouth every 8  (eight) hours as needed for nausea or vomiting. 12/05/22   Lindell Spar, MD  tolterodine (DETROL LA) 4 MG 24 hr capsule Take 1 capsule (4 mg total) by mouth daily. Patient not taking: Reported on 12/25/2022 08/28/22   Cleon Gustin, MD  vitamin B-12 (CYANOCOBALAMIN) 500 MCG tablet Take 500 mcg by mouth daily.    [provider]    Physical Exam: BP 91/89 (BP Location: Right Arm)   Pulse 64   Temp (!) 97.2 F (36.2 C) (Oral)   Resp 16   Ht '5\' 3"'$  (1.6 m)   Wt 68 kg   SpO2 100%   BMI 26.56 kg/m   General: 82 y.o. year-old female frail-appearing in no acute distress.  Alert and oriented x3. Cardiovascular: Regular rate and rhythm with no rubs or gallops.  No thyromegaly or JVD noted.  3+ pitting edema lower extremities and upper extremities bilaterally.  Respiratory: Clear to auscultation with no wheezes or rales. Good inspiratory effort. Abdomen: Soft nontender nondistended with normal bowel sounds x4 quadrants. Muskuloskeletal: 3+ pitting edema in all 4 extremities. Neuro: CN II-XII intact, strength, sensation, reflexes Skin: Very thin skin, frail with upper extremity and lower extremity diffuse ecchymosis. Psychiatry: Judgement and insight appear normal. Mood is appropriate for condition and setting          Labs on Admission:  Basic Metabolic Panel: Recent Labs  Lab 12/29/22 1122 01/03/23 1927  NA 136 138  K 3.5 3.5  CL 107 109  CO2 23 19*  GLUCOSE 92 118*  BUN 27* 27*  CREATININE 1.26* 1.68*  CALCIUM 7.9* 7.7*   Liver Function Tests: Recent Labs  Lab 12/29/22 1122 01/03/23 1927  AST 27 33  ALT 15 14  ALKPHOS 66 69  BILITOT 0.9 0.9  PROT 4.8* 4.3*  ALBUMIN 2.1* 1.9*   Recent Labs  Lab 01/03/23 1927  LIPASE 26   Recent Labs  Lab 01/03/23 1927  AMMONIA 46*   CBC: Recent Labs  Lab 12/29/22 1122 01/03/23 1927  WBC 4.0 4.7  NEUTROABS 2.5 3.4  HGB 9.2* 8.6*  HCT 28.4* 25.6*  MCV 101.8* 100.8*  PLT 166 171   Cardiac Enzymes: No  results for input(s): "CKTOTAL", "CKMB", "CKMBINDEX", "TROPONINI" in the last 168 hours.  BNP (last 3  results) Recent Labs    10/20/22 0820 10/23/22 1431 01/03/23 1927  BNP 933.0* 328.0* 259.3*    ProBNP (last 3 results) No results for input(s): "PROBNP" in the last 8760 hours.  CBG: No results for input(s): "GLUCAP" in the last 168 hours.  Radiological Exams on Admission: CT Angio Chest PE W and/or Wo Contrast  Result Date: 01/03/2023 CLINICAL DATA:  Shortness of breath and abdominal pain. History of NASH cirrhosis, clotting disorder, CKD. EXAM: CT ANGIOGRAPHY CHEST CT ABDOMEN AND PELVIS WITH CONTRAST TECHNIQUE: Multidetector CT imaging of the chest was performed using the standard protocol during bolus administration of intravenous contrast. Multiplanar CT image reconstructions and MIPs were obtained to evaluate the vascular anatomy. Multidetector CT imaging of the abdomen and pelvis was performed using the standard protocol during bolus administration of intravenous contrast. RADIATION DOSE REDUCTION: This exam was performed according to the departmental dose-optimization program which includes automated exposure control, adjustment of the mA and/or kV according to patient size and/or use of iterative reconstruction technique. CONTRAST:  61m OMNIPAQUE IOHEXOL 350 MG/ML SOLN COMPARISON:  Portable chest today, AP Lat chest 11/14/2022, chest CT no contrast 10/27/2022, partial CTA chest 05/30/2022, CT abdomen and pelvis with contrast 11/14/2022, and CT abdomen and pelvis without contrast dated 10/24/2022 and 10/05/2022. FINDINGS: CTA CHEST FINDINGS Cardiovascular: The cardiac size is normal. There is aortic atherosclerosis and mild tortuosity without aneurysm, dissection or stenosis. Scattered plaques in the great vessels without great vessel stenosis. There are calcifications in the inferior mitral ring and heavy three-vessel calcific CAD. There is no significant pericardial effusion. Pulmonary  arteries are normal in caliber and clear at least through the segmental divisions. The subsegmental arterial bed largely not evaluated due to breathing motion. A subsegmental embolus could be missed. Pulmonary veins are decompressed. Mediastinum/Nodes: No intrathoracic or axillary adenopathy is seen and no thyroid mass, tracheal filling defect or esophageal thickening. Lungs/Pleura: Moderate left pleural effusion is again noted, first seen on the abdomen and pelvis CT dated 10/05/2022, only very minimally increased in size since last year's studies. There is overlying compressive atelectatic effect which does not appear consistent with consolidation. There is chronic scarring in both apices, anterior subpleural scarring and bronchiolectasis in the left upper lobe again noted. There is no pneumothorax or pleural fluid. The main bronchi and remaining lungs are clear. Musculoskeletal: Generalized chest wall anasarca has increased. There is thoracic dextroscoliosis and multilevel bridging enthesopathy. Osteopenia. The ribcage is intact. Review of the MIP images confirms the above findings. CT ABDOMEN and PELVIS FINDINGS Hepatobiliary: The liver is small and cirrhotic. There is mild steatosis with improvement. Calcified granulomas in the right lobe. No liver mass is seen. Gallbladder is absent with stable chronic common bile duct prominence up to 11 mm. Pancreas: Diffusely atrophic and otherwise unremarkable. Spleen: Within normal size limits. There are scattered calcified granulomas. Adrenals/Urinary Tract: Bilateral cortical thinning left-greater-than-right, asymmetric left-sided volume loss left kidney. No adrenal mass. There is a 2 mm nonobstructive caliceal stone in the upper pole left kidney, punctate nonobstructive stone in the upper pole on the right. No hydronephrosis is seen. A 1 x 2 mm stone in the distal most left ureter is again shown and was also present on the prior exams as well, positioning is unaltered.  No bladder thickening or intravesical stones. Stomach/Bowel: Small hiatal hernia. The stomach is contracted. Thickened folds continued debris seen in the jejunum. There is increased thickening versus underdistention the ascending and transverse colon. No bowel obstruction is evident. Scattered colonic diverticula  without focal inflammation are again noted. Vascular/Lymphatic: Within normal caliber limits portal vein. No portal vein, splenic vein or SMV thrombosis. Retroperitoneal engorged varices from the SMV are again noted along side the abdominal aorta and left common iliac vasculature, emptying into the left common iliac vein. The abdominal aorta and iliac arteries are heavily calcified. No adenopathy is seen. Reproductive: Status post hysterectomy. No adnexal masses. Other: There is interval increase large-volume abdominal and pelvic free ascites. No free air is seen. To the left and right of midline in the base of the buttocks, there are bilateral new perianal complex ovoid collections, contents measuring at the high end of fluid density, measuring 4.8 x 3.5 cm on the right and 5.5 x 4 cm on the left. There are serpiginous densities within the right-sided collection and linear densities within the left-sided collection which could indicate the presence of vascular flow or active bleeding within the collections such as due to bilateral actively bleeding hematomas. Please correlate the findings clinically. There is no air within either collection. Musculoskeletal: Moderate compression fracture L2 vertebral body is again noted. Osteopenia and degenerative change thoracic and lumbar spine. Multilevel acquired lumbar foraminal stenosis. Review of the MIP images confirms the above findings. IMPRESSION: 1. No arterial embolus is seen at least through the segmental divisions. The subsegmental arterial tree is largely obscured due to breathing motion. 2. Moderate left pleural effusion with overlying compressive  atelectatic effect, only very minimally increased in size since last year's studies, and has been present at least as far back as October 2023 abdomen and pelvis CT. 3. Increased generalized anasarca. 4. Aortic and coronary artery atherosclerosis. 5. Cirrhotic liver with improvement in hepatic steatosis. No liver mass is seen. 6. Increased large-volume free ascites. 7. Increased thickening versus underdistention or portal colopathy of the ascending and transverse colon. 8. Gastroenteritis versus changes due to portal hypertension. No bowel obstruction or inflammation. 9. New bilateral perianal complex ovoid collections with serpiginous densities in the right-sided collection and linear densities in the left-sided collection, which could indicate the presence of vascular flow or active bleeding within the collections such as due to bilateral actively bleeding hematomas. Please correlate the findings clinically. 10. Bilateral nonobstructive nephrolithiasis with 1 x 2 mm stone in the distal most left ureter but no hydronephrosis. This was seen on all prior studies listed above. 11. Additional findings described above. 12. These results will be called to the ordering clinician or representative by the Radiologist Assistant, and communication documented in the PACS or [employer]. Aortic Atherosclerosis (ICD10-I70.0). Electronically Signed   By: Telford Nab M.D.   On: 01/03/2023 23:55   CT ABDOMEN PELVIS W CONTRAST  Result Date: 01/03/2023 CLINICAL DATA:  Shortness of breath and abdominal pain. History of NASH cirrhosis, clotting disorder, CKD. EXAM: CT ANGIOGRAPHY CHEST CT ABDOMEN AND PELVIS WITH CONTRAST TECHNIQUE: Multidetector CT imaging of the chest was performed using the standard protocol during bolus administration of intravenous contrast. Multiplanar CT image reconstructions and MIPs were obtained to evaluate the vascular anatomy. Multidetector CT imaging of the abdomen and pelvis was performed using  the standard protocol during bolus administration of intravenous contrast. RADIATION DOSE REDUCTION: This exam was performed according to the departmental dose-optimization program which includes automated exposure control, adjustment of the mA and/or kV according to patient size and/or use of iterative reconstruction technique. CONTRAST:  55m OMNIPAQUE IOHEXOL 350 MG/ML SOLN COMPARISON:  Portable chest today, AP Lat chest 11/14/2022, chest CT no contrast 10/27/2022, partial CTA chest 05/30/2022, CT  abdomen and pelvis with contrast 11/14/2022, and CT abdomen and pelvis without contrast dated 10/24/2022 and 10/05/2022. FINDINGS: CTA CHEST FINDINGS Cardiovascular: The cardiac size is normal. There is aortic atherosclerosis and mild tortuosity without aneurysm, dissection or stenosis. Scattered plaques in the great vessels without great vessel stenosis. There are calcifications in the inferior mitral ring and heavy three-vessel calcific CAD. There is no significant pericardial effusion. Pulmonary arteries are normal in caliber and clear at least through the segmental divisions. The subsegmental arterial bed largely not evaluated due to breathing motion. A subsegmental embolus could be missed. Pulmonary veins are decompressed. Mediastinum/Nodes: No intrathoracic or axillary adenopathy is seen and no thyroid mass, tracheal filling defect or esophageal thickening. Lungs/Pleura: Moderate left pleural effusion is again noted, first seen on the abdomen and pelvis CT dated 10/05/2022, only very minimally increased in size since last year's studies. There is overlying compressive atelectatic effect which does not appear consistent with consolidation. There is chronic scarring in both apices, anterior subpleural scarring and bronchiolectasis in the left upper lobe again noted. There is no pneumothorax or pleural fluid. The main bronchi and remaining lungs are clear. Musculoskeletal: Generalized chest wall anasarca has  increased. There is thoracic dextroscoliosis and multilevel bridging enthesopathy. Osteopenia. The ribcage is intact. Review of the MIP images confirms the above findings. CT ABDOMEN and PELVIS FINDINGS Hepatobiliary: The liver is small and cirrhotic. There is mild steatosis with improvement. Calcified granulomas in the right lobe. No liver mass is seen. Gallbladder is absent with stable chronic common bile duct prominence up to 11 mm. Pancreas: Diffusely atrophic and otherwise unremarkable. Spleen: Within normal size limits. There are scattered calcified granulomas. Adrenals/Urinary Tract: Bilateral cortical thinning left-greater-than-right, asymmetric left-sided volume loss left kidney. No adrenal mass. There is a 2 mm nonobstructive caliceal stone in the upper pole left kidney, punctate nonobstructive stone in the upper pole on the right. No hydronephrosis is seen. A 1 x 2 mm stone in the distal most left ureter is again shown and was also present on the prior exams as well, positioning is unaltered. No bladder thickening or intravesical stones. Stomach/Bowel: Small hiatal hernia. The stomach is contracted. Thickened folds continued debris seen in the jejunum. There is increased thickening versus underdistention the ascending and transverse colon. No bowel obstruction is evident. Scattered colonic diverticula without focal inflammation are again noted. Vascular/Lymphatic: Within normal caliber limits portal vein. No portal vein, splenic vein or SMV thrombosis. Retroperitoneal engorged varices from the SMV are again noted along side the abdominal aorta and left common iliac vasculature, emptying into the left common iliac vein. The abdominal aorta and iliac arteries are heavily calcified. No adenopathy is seen. Reproductive: Status post hysterectomy. No adnexal masses. Other: There is interval increase large-volume abdominal and pelvic free ascites. No free air is seen. To the left and right of midline in the base  of the buttocks, there are bilateral new perianal complex ovoid collections, contents measuring at the high end of fluid density, measuring 4.8 x 3.5 cm on the right and 5.5 x 4 cm on the left. There are serpiginous densities within the right-sided collection and linear densities within the left-sided collection which could indicate the presence of vascular flow or active bleeding within the collections such as due to bilateral actively bleeding hematomas. Please correlate the findings clinically. There is no air within either collection. Musculoskeletal: Moderate compression fracture L2 vertebral body is again noted. Osteopenia and degenerative change thoracic and lumbar spine. Multilevel acquired lumbar foraminal stenosis. Review  of the MIP images confirms the above findings. IMPRESSION: 1. No arterial embolus is seen at least through the segmental divisions. The subsegmental arterial tree is largely obscured due to breathing motion. 2. Moderate left pleural effusion with overlying compressive atelectatic effect, only very minimally increased in size since last year's studies, and has been present at least as far back as October 2023 abdomen and pelvis CT. 3. Increased generalized anasarca. 4. Aortic and coronary artery atherosclerosis. 5. Cirrhotic liver with improvement in hepatic steatosis. No liver mass is seen. 6. Increased large-volume free ascites. 7. Increased thickening versus underdistention or portal colopathy of the ascending and transverse colon. 8. Gastroenteritis versus changes due to portal hypertension. No bowel obstruction or inflammation. 9. New bilateral perianal complex ovoid collections with serpiginous densities in the right-sided collection and linear densities in the left-sided collection, which could indicate the presence of vascular flow or active bleeding within the collections such as due to bilateral actively bleeding hematomas. Please correlate the findings clinically. 10. Bilateral  nonobstructive nephrolithiasis with 1 x 2 mm stone in the distal most left ureter but no hydronephrosis. This was seen on all prior studies listed above. 11. Additional findings described above. 12. These results will be called to the ordering clinician or representative by the Radiologist Assistant, and communication documented in the PACS or [employer]. Aortic Atherosclerosis (ICD10-I70.0). Electronically Signed   By: Telford Nab M.D.   On: 01/03/2023 23:55   DG Chest Port 1 View  Result Date: 01/03/2023 CLINICAL DATA:  Fatigue, shortness of breath EXAM: PORTABLE CHEST 1 VIEW COMPARISON:  11/14/2022 FINDINGS: Lungs are essentially clear. Osseous spurring related to the anterior right 1st rib along the medial right upper lung, without corresponding parenchymal lesion on prior CT. No pleural effusion or pneumothorax. The heart is normal in size. Right chest power port terminates at the cavoatrial junction. IMPRESSION: No evidence of acute cardiopulmonary disease. Electronically Signed   By: Julian Hy M.D.   On: 01/03/2023 19:44    EKG: I independently viewed the EKG done and my findings are as followed: Sinus rhythm rate of 72.  With artifacts.  Nonspecific ST-T changes.  QTc 434.  Assessment/Plan Present on Admission:  Decompensated hepatic cirrhosis (HCC)  Principal Problem:   Decompensated hepatic cirrhosis (HCC)  Decompensated hepatic cirrhosis with ascites and anasarca INR 2.2, ammonia mildly elevated. IV albumin every 6 hours x 4 doses Maintain MAP greater than 65 Vasopressor Levophed to maintain MAP greater than 65.  Septic shock due to UTI versus others Follow body fluid culture, urine culture and peripheral blood cultures Continue empiric cefepime Maintain MAP greater than 65 Obtain MRSA screening test Monitor fever curve and WBC De-escalate antibiotics when able.  Acute on chronic blood loss anemia, suspect from right lower extremity skin tear with  bleeding Closely monitor H&H and transfuse hemoglobin less than 8.  Paroxysmal atrial fibrillation on Eliquis Hold off any beta-blockers, calcium channel blockers, or any blood pressure medications Monitor on telemetry  Left pleural effusion, moderate IR consulted for possible thoracentesis. Currently not hypoxic. Incentive spirometer.  Anasarca in the setting of severe hypoalbuminemia and decompensated cirrhosis IV albumin Pressure support Avoid IV fluid as able  Right lower extremity skin tear, traumatic From hitting on her wheelchair. Wound care specialist consulted to assist with the management Continue local wound care.  Ascites IR consulted for paracentesis, for therapeutic and diagnostic purposes Follow body fluid cultures results  Severe protein calorie malnutrition Serum albumin 1.9 Severe muscle mass loss.  Non anion gap metabolic acidosis secondary to acute renal insufficiency Serum bicarb 19, anion gap 10 Repeat chemistry panel in the morning  AKI on CKD 3B, suspect prerenal in the setting of dehydration from poor oral intake Baseline creatinine appears to be 1.26 with GFR 43 Presented with creatinine 1.68 with GFR 31. Avoid nephrotoxic agents, dehydration and hypotension. Monitor urine output with strict I's and O's Repeat chemistry panel in the morning.  Elevated ammonia level in the setting of cirrhosis Ammonia 46. Goal bowel movement 3 loose stools per day.    Critical care time: 65 minutes.     DVT prophylaxis: Home Eliquis  Code Status: DNR.  Family Communication: Granddaughter at bedside.  Disposition Plan: Initially admitted to stepdown unit.  Due to refractory hypotension, the patient was started on IV vasopressor and was transferred to the ICU at [place] called: PCCM  Admission status: Inpatient status.   Status is: Inpatient The patient requires at least 2 midnights for further evaluation and treatment of  present condition.   Kayleen Memos MD [facility] Pager [phone]  If 7PM-7AM, please contact night-coverage [email or web address] Password [facility]  01/04/2023, 12:31 AM

## 2023-01-05 ENCOUNTER — Inpatient Hospital Stay: Payer: Medicare Other | Admitting: Physician Assistant

## 2023-01-05 DIAGNOSIS — E039 Hypothyroidism, unspecified: Secondary | ICD-10-CM | POA: Diagnosis not present

## 2023-01-05 DIAGNOSIS — K7290 Hepatic failure, unspecified without coma: Secondary | ICD-10-CM | POA: Diagnosis not present

## 2023-01-05 DIAGNOSIS — R609 Edema, unspecified: Secondary | ICD-10-CM

## 2023-01-05 DIAGNOSIS — K7460 Unspecified cirrhosis of liver: Secondary | ICD-10-CM | POA: Diagnosis not present

## 2023-01-05 DIAGNOSIS — R579 Shock, unspecified: Secondary | ICD-10-CM

## 2023-01-05 LAB — GLUCOSE, CAPILLARY
Glucose-Capillary: 167 mg/dL — ABNORMAL HIGH (ref 70–99)
Glucose-Capillary: 154 mg/dL — ABNORMAL HIGH (ref 70–99)
Glucose-Capillary: 203 mg/dL — ABNORMAL HIGH (ref 70–99)
Glucose-Capillary: 170 mg/dL — ABNORMAL HIGH (ref 70–99)

## 2023-01-05 LAB — BPAM FFP
Blood Product Expiration Date: 202402022359
ISSUE DATE / TIME: 202401290020
Unit Type and Rh: 8400

## 2023-01-05 LAB — MAGNESIUM: Magnesium: 1.7 mg/dL (ref 1.7–2.4)

## 2023-01-05 LAB — TSH: TSH: 80.161 u[IU]/mL — ABNORMAL HIGH (ref 0.350–4.500)

## 2023-01-05 LAB — CBC
HCT: 25.4 % — ABNORMAL LOW (ref 36.0–46.0)
Hemoglobin: 8.6 g/dL — ABNORMAL LOW (ref 12.0–15.0)
MCH: 33.3 pg (ref 26.0–34.0)
MCHC: 33.9 g/dL (ref 30.0–36.0)
MCV: 98.4 fL (ref 80.0–100.0)
Platelets: 159 10*3/uL (ref 150–400)
RBC: 2.58 MIL/uL — ABNORMAL LOW (ref 3.87–5.11)
RDW: 18.2 % — ABNORMAL HIGH (ref 11.5–15.5)
WBC: 9.9 10*3/uL (ref 4.0–10.5)
nRBC: 0 % (ref 0.0–0.2)

## 2023-01-05 LAB — PREPARE FRESH FROZEN PLASMA: Unit division: 0

## 2023-01-05 LAB — COMPREHENSIVE METABOLIC PANEL
ALT: 15 U/L (ref 0–44)
AST: 29 U/L (ref 15–41)
Albumin: 1.7 g/dL — ABNORMAL LOW (ref 3.5–5.0)
Alkaline Phosphatase: 59 U/L (ref 38–126)
Anion gap: 8 (ref 5–15)
BUN: 25 mg/dL — ABNORMAL HIGH (ref 8–23)
CO2: 19 mmol/L — ABNORMAL LOW (ref 22–32)
Calcium: 7.1 mg/dL — ABNORMAL LOW (ref 8.9–10.3)
Chloride: 103 mmol/L (ref 98–111)
Creatinine, Ser: 1.74 mg/dL — ABNORMAL HIGH (ref 0.44–1.00)
GFR, Estimated: 29 mL/min — ABNORMAL LOW (ref >60)
Glucose, Bld: 168 mg/dL — ABNORMAL HIGH (ref 70–99)
Potassium: 2.9 mmol/L — ABNORMAL LOW (ref 3.5–5.1)
Sodium: 130 mmol/L — ABNORMAL LOW (ref 135–145)
Total Bilirubin: 1.2 mg/dL (ref 0.3–1.2)
Total Protein: 3.7 g/dL — ABNORMAL LOW (ref 6.5–8.1)

## 2023-01-05 LAB — URINE CULTURE

## 2023-01-05 LAB — PROTIME-INR
INR: 2.6 — ABNORMAL HIGH (ref 0.8–1.2)
Prothrombin Time: 27.8 seconds — ABNORMAL HIGH (ref 11.4–15.2)

## 2023-01-05 LAB — PHOSPHORUS: Phosphorus: 2.3 mg/dL — ABNORMAL LOW (ref 2.5–4.6)

## 2023-01-05 LAB — CORTISOL: Cortisol, Plasma: 18.5 ug/dL

## 2023-01-05 LAB — PREPARE RBC (CROSSMATCH)

## 2023-01-05 LAB — AMMONIA: Ammonia: 33 umol/L (ref 9–35)

## 2023-01-05 MED ORDER — PHYTONADIONE 5 MG PO TABS
10.0000 mg | ORAL_TABLET | Freq: Once | ORAL | Status: AC
  Administered 2023-01-05: 10 mg via ORAL
  Filled 2023-01-05: qty 2

## 2023-01-05 MED ORDER — SODIUM CHLORIDE 0.9% FLUSH: 10.0000 mL | INTRAVENOUS | Status: DC | PRN

## 2023-01-05 MED ORDER — MIDODRINE HCL 5 MG PO TABS
15.0000 mg | ORAL_TABLET | Freq: Three times a day (TID) | ORAL | Status: DC
  Filled 2023-01-05 (×2): qty 3

## 2023-01-05 MED ORDER — OXYCODONE HCL 5 MG PO TABS
5.0000 mg | ORAL_TABLET | Freq: Three times a day (TID) | ORAL | Status: DC | PRN
  Administered 2023-01-05: 5 mg via ORAL
  Filled 2023-01-05: qty 1

## 2023-01-05 MED ORDER — HEPARIN SODIUM (PORCINE) 5000 UNIT/ML IJ SOLN
5000.0000 [IU] | Freq: Three times a day (TID) | INTRAMUSCULAR | Status: DC
  Administered 2023-01-05 – 2023-01-07 (×6): 5000 [IU] via SUBCUTANEOUS
  Filled 2023-01-05 (×6): qty 1

## 2023-01-05 MED ORDER — SODIUM CHLORIDE 0.9% FLUSH
10.0000 mL | Freq: Two times a day (BID) | INTRAVENOUS | Status: DC
  Administered 2023-01-05 – 2023-01-07 (×6): 10 mL

## 2023-01-05 MED ORDER — POTASSIUM CHLORIDE 10 MEQ/50ML IV SOLN
10.0000 meq | INTRAVENOUS | Status: AC
  Administered 2023-01-05 (×4): 10 meq via INTRAVENOUS
  Filled 2023-01-05 (×4): qty 50

## 2023-01-05 MED ORDER — ONDANSETRON HCL 4 MG/2ML IJ SOLN
4.0000 mg | Freq: Three times a day (TID) | INTRAMUSCULAR | Status: DC
  Administered 2023-01-05 (×2): 4 mg via INTRAVENOUS
  Filled 2023-01-05 (×2): qty 2

## 2023-01-05 MED ORDER — POTASSIUM PHOSPHATES 15 MMOLE/5ML IV SOLN
30.0000 mmol | Freq: Once | INTRAVENOUS | Status: AC
  Administered 2023-01-05: 30 mmol via INTRAVENOUS
  Filled 2023-01-05: qty 10

## 2023-01-05 MED ORDER — ENSURE ENLIVE PO LIQD
237.0000 mL | Freq: Three times a day (TID) | ORAL | Status: DC
  Administered 2023-01-05: 237 mL via ORAL

## 2023-01-05 MED ORDER — VASOPRESSIN 20 UNITS/100 ML INFUSION FOR SHOCK
0.0000 [IU]/min | INTRAVENOUS | Status: DC
  Administered 2023-01-05 – 2023-01-07 (×6): 0.03 [IU]/min via INTRAVENOUS
  Filled 2023-01-05 (×6): qty 100

## 2023-01-05 NOTE — Progress Notes (Signed)
[place] Progress Note Patient Name: Tasha Roberts DOB: 1941-07-21 MRN: [id number]   Date of Service  01/05/2023  HPI/Events of Note  C/o back pain  eICU Interventions  PRN oxycodone 5 mg q8h ordered     Intervention Category Intermediate Interventions: Pain - evaluation and management  Tadao Emig Rodman Pickle 01/05/2023, 12:17 AM

## 2023-01-05 NOTE — NC FL2 (Signed)
[place] LEVEL OF CARE FORM     IDENTIFICATION  Patient Name: Tasha Roberts Birthdate: 1941/10/30 Sex: female Admission Date (Current Location): 01/03/2023  [facility] [place] Number:  Herbalist and Address:  [facility],  [address], [place]      Provider Number: [id number]  Attending Physician Name and Address:  Julian Hy, DO  Relative Name and Phone Number:       Current Level of Care: Hospital Recommended Level of Care: [place] Prior Approval Number:    Date Approved/Denied:   PASRR Number: [id number]  Discharge Plan: SNF    Current Diagnoses: Patient Active Problem List   Diagnosis Date Noted   Decompensated hepatic cirrhosis ([place]) 01/04/2023   Septic shock ([place]) 01/04/2023   Other ascites 01/04/2023   Pressure injury of skin 01/04/2023   Hypotension 10/23/2022   Acute anemia 10/23/2022   Extremity edema 10/23/2022   Acute kidney injury superimposed on chronic kidney disease ([place]) 10/23/2022   Colitis 10/16/2022   Diarrhea 10/15/2022   Urge incontinence of urine 09/01/2022   Oral phase dysphagia 09/01/2022   Moderate protein-calorie malnutrition ([place]) 09/01/2022   Skin ulcer of sacrum, limited to breakdown of skin ([place]) 08/12/2022   Closed displaced fracture of sixth cervical vertebra with routine healing 08/06/2022   Chronic anemia 07/27/2022   UTI (urinary tract infection) 07/25/2022   Compression fracture of L2 ([place]) 07/25/2022   Abdominal cramping 04/07/2022   Left ureteral stone 03/28/2022   Nausea & vomiting 03/28/2022   Serrated adenoma of colon 01/09/2022   Depression, major, single episode, moderate ([place]) 12/05/2021   Gastroesophageal reflux disease 12/05/2021   Physical deconditioning 12/05/2021   Mixed hyperlipidemia 12/05/2021   Essential hypertension 07/31/2021   Age related osteoporosis 07/31/2021   Acquired hypothyroidism 07/31/2021   Chronic diarrhea 07/31/2021    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   Iron deficiency anemia due to chronic blood loss 03/04/2021   Thrombocytopenia ([place]) 03/04/2021   Acquired thrombophilia ([place]) 02/21/2021   CAD (coronary artery disease)    Lichen planus 60/09/9322   AF (paroxysmal atrial fibrillation) ([place]) 10/02/2020   Liver cirrhosis secondary to NASH ([place]) 10/02/2020   Graves disease 55/73/2202   Nonalcoholic steatohepatitis (NASH) 10/12/2012   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   Vitamin D deficiency 04/13/2007   DM II (diabetes mellitus, type II), controlled ([place]) 08/27/2005   Sarcoidosis 1965    Orientation RESPIRATION BLADDER Height & Weight     Self, Time, Place, Situation  Normal Continent Weight: 68 kg Height:  '5\' 3"'$  (160 cm)  BEHAVIORAL SYMPTOMS/MOOD NEUROLOGICAL BOWEL NUTRITION STATUS      Continent Diet (regular)  AMBULATORY STATUS COMMUNICATION OF NEEDS Skin   Extensive Assist Verbally Normal                       Personal Care Assistance Level of Assistance  Bathing, Feeding, Dressing Bathing Assistance: Limited assistance Feeding assistance: Limited assistance Dressing Assistance: Limited assistance     Functional Limitations Info  Sight, Hearing, Speech Sight Info: Adequate Hearing Info: Adequate Speech Info: Adequate    SPECIAL CARE FACTORS FREQUENCY  PT (By licensed PT), OT (By licensed OT)     PT Frequency: 5 x weekly OT Frequency: 5 x weekly            Contractures      Additional Factors Info  Code Status Code Status Info: DNR  Current Medications (01/05/2023):  This is the current hospital active medication list Current Facility-Administered Medications  Medication Dose Route Frequency Provider Last Rate Last Admin   0.9 %  sodium chloride infusion  250 mL Intravenous Continuous Mick Sell, PA-C 10 mL/hr at 01/05/23 1351 Infusion Verify at 01/05/23 1351   atorvastatin (LIPITOR) tablet 20 mg  20 mg  Oral Daily Mick Sell, PA-C   20 mg at 01/05/23 1123   ceFEPIme (MAXIPIME) 2 g in sodium chloride 0.9 % 100 mL IVPB  2 g Intravenous Q24H Fransico Meadow, MD   Stopped at 01/04/23 2050   Chlorhexidine Gluconate Cloth 2 % PADS 6 each  6 each Topical Daily [name], MD   6 each at 01/05/23 1517   cyanocobalamin (VITAMIN B12) tablet 500 mcg  500 mcg Oral Daily Mick Sell, PA-C   500 mcg at 01/05/23 1123   docusate sodium (COLACE) capsule 100 mg  100 mg Oral BID PRN Mick Sell, PA-C       feeding supplement (ENSURE ENLIVE / ENSURE PLUS) liquid 237 mL  237 mL Oral TID BM Noemi Chapel P, DO   237 mL at 01/05/23 1130   Gerhardt's butt cream   Topical TID [name], MD   Given at 01/05/23 0927   heparin injection 5,000 Units  5,000 Units Subcutaneous Q8H Noemi Chapel P, DO   5,000 Units at 01/05/23 1128   insulin aspart (novoLOG) injection 0-5 Units  0-5 Units Subcutaneous QHS [name], MD       insulin aspart (novoLOG) injection 0-9 Units  0-9 Units Subcutaneous TID WC [name], MD   2 Units at 01/05/23 1136   ipratropium-albuterol (DUONEB) 0.5-2.5 (3) MG/3ML nebulizer solution 3 mL  3 mL Nebulization Q4H PRN Mick Sell, PA-C       levothyroxine (SYNTHROID) tablet 100 mcg  100 mcg Oral QAC breakfast Mick Sell, PA-C   100 mcg at 01/05/23 0815   midodrine (PROAMATINE) tablet 15 mg  15 mg Oral TID WC Julian Hy, DO       And   ondansetron [facility]) injection 4 mg  4 mg Intravenous TID with meals Noemi Chapel P, DO   4 mg at 01/05/23 1138   norepinephrine (LEVOPHED) '4mg'$  in 231m (0.016 mg/mL) premix infusion  0-40 mcg/min Intravenous Titrated SChesley Mires MD 48.8 mL/hr at 01/05/23 1351 13 mcg/min at 01/05/23 1351   ondansetron (ZOFRAN) injection 4 mg  4 mg Intravenous Q6H PRN PMick Sell PA-C       Oral care mouth rinse  15 mL Mouth Rinse PRN SChesley Mires MD       oxyCODONE (Oxy IR/ROXICODONE) immediate release tablet 5 mg  5 mg Oral Q8H PRN EMargaretha Seeds MD   5  mg at 01/05/23 0224   pantoprazole (PROTONIX) injection 40 mg  40 mg Intravenous Daily PMick Sell PA-C   40 mg at 01/05/23 1125   polyethylene glycol (MIRALAX / GLYCOLAX) packet 17 g  17 g Oral Daily PRN PAndres LabrumD, PA-C       sodium chloride flush (NS) 0.9 % injection 10-40 mL  10-40 mL Intracatheter Q12H EMargaretha Seeds MD   10 mL at 01/05/23 0937   sodium chloride flush (NS) 0.9 % injection 10-40 mL  10-40 mL Intracatheter PRN EMargaretha Seeds MD       vasopressin (PITRESSIN) 20 Units in sodium chloride 0.9 % 100 mL infusion-*FOR SHOCK*  0-0.03 Units/min  Intravenous Continuous Julian Hy, DO 9 mL/hr at 01/05/23 1351 0.03 Units/min at 01/05/23 1351     Discharge Medications: Please see discharge summary for a list of discharge medications.  Relevant Imaging Results:  Relevant Lab Results:   Additional Information SS# [id number]  Leeroy Cha, RN

## 2023-01-05 NOTE — Progress Notes (Deleted)
Reschedule - currently hospitalized

## 2023-01-05 NOTE — Evaluation (Signed)
Physical Therapy Evaluation Patient Details Name: Tasha Roberts MRN: [id number] DOB: 1941-04-30 Today's Date: 01/05/2023  History of Present Illness  82 yo female with NASH with cirrhosis and ascites presented to the ER with weakness and dyspnea.  Found to have low blood pressure.  Concern for sepsis and she was started on Abx, and pressors. PMH: Chronic hypotension on midodrine, NASH with cirrhosis, A fib on eliquis, Breast cancer s/p mastectomy and XRT in 2012, CKD 4, IDA, Hypothyroidism, HTN, HLD, Osteoporosis, Bronchiectasis, Depression, DM type 2, GERD, Grave's disease, HLD, Sarcoidosis  Clinical Impression  Pt admitted with above diagnosis. Total assist for supine to sit, pt sat at edge of bed for ~90 seconds, tolerance limited by back pain, min to mod A for sitting balance. Pt reports its been a couple months since she's been able to walk. Her daughter who normally assists her has a fractured wrist and torn knee meniscus, so cannot assist now. ST-SNF recommended.  Pt currently with functional limitations due to the deficits listed below (see PT Problem List). Pt will benefit from skilled PT to increase their independence and safety with mobility to allow discharge to the venue listed below.          Recommendations for follow up therapy are one component of a multi-disciplinary discharge planning process, led by the attending physician.  Recommendations may be updated based on patient status, additional functional criteria and insurance authorization.  Follow Up Recommendations Skilled nursing-short term rehab (<3 hours/day) Can patient physically be transported by private vehicle: No    Assistance Recommended at Discharge Frequent or constant Supervision/Assistance  Patient can return home with the following  Two people to help with walking and/or transfers;Two people to help with bathing/dressing/bathroom;Assistance with cooking/housework;Assist for transportation;Help with stairs or ramp  for entrance    Equipment Recommendations None recommended by PT  Recommendations for Other Services       Functional Status Assessment Patient has had a recent decline in their functional status and demonstrates the ability to make significant improvements in function in a reasonable and predictable amount of time.     Precautions / Restrictions Precautions Precautions: Fall Restrictions Weight Bearing Restrictions: No      Mobility  Bed Mobility Overal bed mobility: Needs Assistance Bed Mobility: Supine to Sit, Sit to Supine     Supine to sit: Total assist, +2 for physical assistance Sit to supine: Total assist, +2 for physical assistance   General bed mobility comments: assist to raise trunk, pivot hips, and advance BLEs to edge of bed; pt 15%, mod A for sitting balance, pt reported back pain sitting at edge of bed and requested to return to bed    Transfers                        Ambulation/Gait                  Stairs            Wheelchair Mobility    Modified Rankin (Stroke Patients Only)       Balance Overall balance assessment: Needs assistance Sitting-balance support: Feet unsupported, Bilateral upper extremity supported Sitting balance-Leahy Scale: Poor Sitting balance - Comments: min to mod A for sitting balance, pt sat EOB ~ 90 seconds, unable to tolerate longer 2* back pain Postural control: Posterior lean  Pertinent Vitals/Pain Pain Assessment Pain Assessment: Faces Faces Pain Scale: Hurts little more Pain Location: buttocks Pain Descriptors / Indicators: Discomfort Pain Intervention(s): Limited activity within patient's tolerance, Monitored during session, Repositioned    Home Living Family/patient expects to be discharged to:: Private residence Living Arrangements: Alone Available Help at Discharge: Family;Available PRN/intermittently   Home Access: Level entry        Home Layout: One level Home Equipment: Rollator (4 wheels) Additional Comments: lives alone, daughter's home is 100 feet away, but daughter has broken wrist and knee meniscus tear    Prior Function Prior Level of Function : Needs assist       Physical Assist : Mobility (physical);ADLs (physical) Mobility (physical): Bed mobility;Transfers;Gait;Stairs ADLs (physical): Bathing;Dressing;Toileting Mobility Comments: pt stated she hasn't walked in a couple months ADLs Comments: assisted by family     Hand Dominance        Extremity/Trunk Assessment   Upper Extremity Assessment Upper Extremity Assessment: Defer to OT evaluation    Lower Extremity Assessment Lower Extremity Assessment: RLE deficits/detail;LLE deficits/detail RLE Deficits / Details: B knee ext +2/5, hips -2/5, ankle DF +3/5 LLE Deficits / Details: B knee ext +2/5, hips -2/5, ankle DF +3/5    Cervical / Trunk Assessment Cervical / Trunk Assessment: Kyphotic  Communication   Communication: No difficulties  Cognition Arousal/Alertness: Awake/alert Behavior During Therapy: WFL for tasks assessed/performed Overall Cognitive Status: Within Functional Limits for tasks assessed                                          General Comments      Exercises General Exercises - Upper Extremity Shoulder Flexion: AAROM, Both, 10 reps, Supine General Exercises - Lower Extremity Ankle Circles/Pumps: AROM, Both, 10 reps, Supine Heel Slides: AAROM, Both, 10 reps, Supine Hip ABduction/ADduction: AAROM, Both, 10 reps, Supine   Assessment/Plan    PT Assessment Patient needs continued PT services  PT Problem List Decreased mobility;Decreased activity tolerance;Decreased balance       PT Treatment Interventions Therapeutic activities;Functional mobility training;Balance training;Patient/family education;Wheelchair mobility training;Therapeutic exercise    PT Goals (Current goals can be found in the Care  Plan section)  Acute Rehab PT Goals Patient Stated Goal: to get stronger PT Goal Formulation: With patient Time For Goal Achievement: 01/19/23 Potential to Achieve Goals: Fair    Frequency Min 2X/week     Co-evaluation               AM-PAC PT "6 Clicks" Mobility  Outcome Measure Help needed turning from your back to your side while in a flat bed without using bedrails?: A Lot Help needed moving from lying on your back to sitting on the side of a flat bed without using bedrails?: Total Help needed moving to and from a bed to a chair (including a wheelchair)?: Total Help needed standing up from a chair using your arms (e.g., wheelchair or bedside chair)?: Total Help needed to walk in hospital room?: Total Help needed climbing 3-5 steps with a railing? : Total 6 Click Score: 7    End of Session   Activity Tolerance: Patient limited by fatigue;Patient limited by pain Patient left: in bed;with call bell/phone within reach Nurse Communication: Mobility status;Other (comment) (pt soiled) PT Visit Diagnosis: Difficulty in walking, not elsewhere classified (R26.2);Pain;Other abnormalities of gait and mobility (R26.89);Muscle weakness (generalized) (M62.81)    Time: 1010-1036 PT Time Calculation (  min) (ACUTE ONLY): 26 min   Charges:   PT Evaluation $PT Eval Moderate Complexity: 1 Mod PT Treatments $Therapeutic Activity: 8-22 mins        Blondell Reveal Kistler PT 01/05/2023  Acute Rehabilitation Services  Office [phone]

## 2023-01-05 NOTE — Progress Notes (Signed)
Initial Nutrition Assessment  DOCUMENTATION CODES:   Non-severe (moderate) malnutrition in context of chronic illness  INTERVENTION:  - Plan for Full liquid diet until SLP eval.  - Ensure Plus High Protein po TID, each supplement provides 350 kcal and 20 grams of protein.  - Recommend discontinue vitamin B12 supplementation if medically appropriate. Lab level from 12/29/22 shows vitamin B12 above normal limits.   - Monitor weight trends.    NUTRITION DIAGNOSIS:   Moderate Malnutrition related to chronic illness (NASH with cirrhosis and CKD) as evidenced by moderate fat depletion, moderate muscle depletion.  GOAL:   Patient will meet greater than or equal to 90% of their needs  MONITOR:   PO intake, Supplement acceptance, Diet advancement, Weight trends  REASON FOR ASSESSMENT:   Consult Assessment of nutrition requirement/status, Poor PO, Wound healing  ASSESSMENT:   82 yo female with NASH with cirrhosis and ascites, CKD, HTN, DM2, Grave's disease, GERD who presented to the ER with weakness and dyspnea.   Patient reports UBW of 150# but then states she has had weight gain due to fluid over the past few months. Does not feel she has lost any actual body weight. Weight appears to have trended up since November. Patient is noted to have had 1.2L taken off during paracentesis yesterday and to have deep pitting edema in lower extremities which is likely affecting weight status.   Patient reports she is edentulous and drinks/eats mostly liquids at home. Patient states she will often only have soups (mostly broth without any solids) from a can and 2 Ensure a day. She notes she has never had a good appetite and this continues through this admission. However, she reports she hasn't really eaten much since admission due to being unable to eat a lot of the food due to chewing.  Informed CCM MD of patient's report, plan to obtain SLP eval. Full liquid diet per MD until SP eval completed.   Patient agreeable to continue to receive Ensure during admission. Discussed importance of eating well and drinking ensure to promote healing and maintain strength.    Medications reviewed and include: '500mg'$  vitamin B12, Insulin, '10mg'$  vitamin K (once) Levophed  Labs reviewed:  Na 130 K+ 2.9 Creatinine 1.74 Phosphorus 2.3   Latest Reference Range & Units 12/29/22 11:22  Vitamin B12 180 - 914 pg/mL 2,641 (H)  (H): Data is abnormally high   NUTRITION - FOCUSED PHYSICAL EXAM:  Flowsheet Row Most Recent Value  Orbital Region Moderate depletion  Upper Arm Region Mild depletion  Thoracic and Lumbar Region Mild depletion  Buccal Region Moderate depletion  Temple Region Severe depletion  Clavicle Bone Region Moderate depletion  Clavicle and Acromion Bone Region Moderate depletion  Scapular Bone Region Unable to assess  Dorsal Hand Mild depletion  Patellar Region Unable to assess  [deep pitting edema]  Anterior Thigh Region Unable to assess  [deep pitting edema]  Posterior Calf Region Unable to assess  [deep pitting edema]  Edema (RD Assessment) Moderate  Hair Reviewed  Eyes Reviewed  Mouth Reviewed  Skin Reviewed  Nails Reviewed       Diet Order:   Diet Order             Diet full liquid Room service appropriate? Yes; Fluid consistency: Thin  Diet effective now                   EDUCATION NEEDS:  Education needs have been addressed  Skin:  Skin Assessment: Skin Integrity Issues:  Skin Integrity Issues:: Stage II Stage II: Sacrum  Last BM:  1/28  Height:  Ht Readings from Last 1 Encounters:  01/03/23 '5\' 3"'$  (1.6 m)   Weight:  Wt Readings from Last 1 Encounters:  01/03/23 68 kg    BMI:  Body mass index is 26.56 kg/m.  Estimated Nutritional Needs:  Kcal:  9390-3009 kcals Protein:  80-95 grams Fluid:  >/= 1.9L or per MD    Samson Frederic RD, LDN For contact information, refer to [facility].

## 2023-01-05 NOTE — Progress Notes (Signed)
[place] Progress Note Patient Name: Tasha Roberts DOB: [birth year] MRN: [id number]   Date of Service  01/05/2023  HPI/Events of Note  K2.9 Phos 2.3 Cr 1.74  eICU Interventions  Replet K Pho     Intervention Category Intermediate Interventions: Electrolyte abnormality - evaluation and management  [name] Rodman Pickle 01/05/2023, 6:14 AM

## 2023-01-05 NOTE — TOC Progression Note (Signed)
Transition of Care [facility]) - Progression Note    Patient Details  Name: Charnese Federici MRN: [id number] Date of Birth: May 24, 1941  [facility]) CM/SW Contact  Leeroy Cha, RN Phone Number: 01/05/2023, 3:10 PM  Clinical Narrative:    Phoebe Perch updated in the system.  Fl2 sent out via the hub to surrounding area and .   Expected Discharge Plan: Home/Self Care Barriers to Discharge: Continued Medical Work up  Expected Discharge Plan and Services   Discharge Planning Services: CM Consult   Living arrangements for the past 2 months: Single Family Home                                       Social Determinants of Health (SDOH) Interventions SDOH Screenings   Food Insecurity: No Food Insecurity (10/23/2022)  Housing: Low Risk  (10/23/2022)  Transportation Needs: No Transportation Needs (10/23/2022)  Utilities: Not At Risk (10/23/2022)  Alcohol Screen: Low Risk  (08/18/2022)  Depression (PHQ2-9): Low Risk  (09/01/2022)  Financial Resource Strain: Low Risk  (08/18/2022)  Physical Activity: Insufficiently Active (08/18/2022)  Social Connections: Moderately Isolated (08/18/2022)  Stress: No Stress Concern Present (08/18/2022)  Tobacco Use: Low Risk  (01/04/2023)    Readmission Risk Interventions   Row Labels 10/24/2022   10:48 AM 07/25/2022    3:44 PM  Readmission Risk Prevention Plan   Section Header. No data exists in this row.    Transportation Screening   Complete Complete  HRI or Home Care Consult   Complete Complete  Social Work Consult for [place] Planning/Counseling   Complete Complete  Palliative Care Screening    Complete  Medication Review Press photographer)   Complete Complete

## 2023-01-05 NOTE — Progress Notes (Signed)
Pharmacy Antibiotic Note  Tasha Roberts is a 82 y.o. female for which pharmacy has been consulted for cefepime dosing for sepsis.  Day #3 abx - Afebrile, Tmin 97s - WBC 9.9 - SCr 1.74, CrCl ~24 ml/min - Cultures pending  Plan: Continue Cefepime 2g IV q24h Trend WBC, Fever, Renal function F/u cultures, clinical course, WBC De-escalate when able  Height: '5\' 3"'$  (160 cm) Weight: 68 kg (149 lb 14.6 oz) IBW/kg (Calculated) : 52.4  Temp (24hrs), Avg:97.5 F (36.4 C), Min:97 F (36.1 C), Max:98 F (36.7 C)  Recent Labs  Lab 01/03/23 1927 01/03/23 2146 01/04/23 0405 01/04/23 0456 01/04/23 0701 01/05/23 0451  WBC 4.7  --  7.3  --   --  9.9  CREATININE 1.68*  --  1.73*  --   --  1.74*  LATICACIDVEN 2.8* 2.1*  --  2.6* 1.7  --      Estimated Creatinine Clearance: 24.3 mL/min (A) (by C-G formula based on SCr of 1.74 mg/dL (H)).    Allergies  Allergen Reactions   Dicyclomine Other (See Comments)    Dry cough   Glucophage [Metformin] Diarrhea   Midodrine Hcl Nausea And Vomiting   Midol [Acetaminophen] Other (See Comments)    Unknown    Losartan Other (See Comments)    Heartburn     Antimicrobials this admission: cefepime 1/27 >>  flagyl 1/27 >> 1/28 vancomycin 1/27 >> 1/28  Microbiology results: 1/27 BCx: ngtd 1/27 UCx: multiple species 1/28 MRSA PCR: detected & not detected 1/28 Paracentesis: no org seen  Thank you for allowing pharmacy to be a part of this patient's care.  Peggyann Juba, PharmD, BCPS Pharmacy: [phone] 01/05/2023 2:16 PM

## 2023-01-05 NOTE — Progress Notes (Signed)
OT Cancellation Note  Patient Details Name: Tasha Roberts MRN: [id number] DOB: 1941/05/24   Cancelled Treatment:    Reason Eval/Treat Not Completed: Medical issues which prohibited therapy Patient with soft BP, and on Levophed, nurse asking for therapy to hold off at this time. OT to continue to follow and check back as schedule will allow. Rennie Plowman, [facility] Office# [phone]  01/05/2023, 9:09 AM

## 2023-01-05 NOTE — Evaluation (Signed)
Clinical/Bedside Swallow Evaluation Patient Details  Name: Tasha Roberts MRN: [id number] Date of Birth: [birth year]  Today's Date: 01/05/2023 Time: SLP Start Time (ACUTE ONLY): 55 SLP Stop Time (ACUTE ONLY): 9518 SLP Time Calculation (min) (ACUTE ONLY): 15 min  Past Medical History:  Past Medical History:  Diagnosis Date   Acute kidney injury superimposed on chronic kidney disease ([place]) 10/23/2022   Age related osteoporosis    Anemia due to blood loss    iron infusion's last one 03-25-2021 (pt bleeding post vulvar bx 02-11-2021, hospiatal admission 02-21-2021)   Anticoagulant long-term use    eliquis--- managed by pcp    Arthritis    hands   Bronchiectasis [facility])    pulmonology --- dr Halford Chessman   (04-01-2021  pt stated no supplemental oxygen use since 01/ 2022 and no inhaler use,  stated checks O2 at home during the day 98-99%)   Cancer ([place]) 2013   Lumpectomy left breast   Cataract 2015   Chronic constipation    Clotting disorder ([place])    On eliquis   Coronary artery calcification    per lexiscan 08-17-2019 result in care everywhere   Depression, major, single episode, moderate ([place]) 12/05/2021   DM II (diabetes mellitus, type II), controlled ([place]) 08/27/2005   DOE (dyspnea on exertion)    04-01-2021  per pt only going up stairs    Gastroesophageal reflux disease 12/05/2021   History of diabetes mellitus, type II    04-01-2021  per pt no issues after weight loss, last taken medication approx 2017   History of Graves' disease 2017   s/p RAI   History of left breast cancer    dx 2013,  s/p left partial mastecotmy w/ node dissection 09-15-2012, low grade DCIS, no chemo, completed radiation 02/ 2014,  per pt no recurrence   History of respiratory syncytial virus (RSV) infection 09/2020   w/ hypoxia  , hospital admission 10/ 2021  and acute exacerbation bronchiectasis;  follow up post hospital pulmonogy dr Halford Chessman  note in epic   Hyperlipidemia    Hypertension    followed by pcp   (pt had  lexiscan in care everywhere dated 08-17-2019 no ischemia , non-obstructive extensive calcification, ef 69%)   Hypothyroidism    Hypothyroidism, postradioiodine therapy    followed by pcp   Liver cirrhosis secondary to NASH (nonalcoholic steatohepatitis) ([place])    followed by dr Merrilee Jansky ([facility])---- dx 2004,  compensated ,  last liver bx (03/ 2015) fibrosis stage 3;  moderate portal hypertensive gastrophy   PAF (paroxysmal atrial fibrillation) ([place]) previous cardiologist lov note w/ Suann Larry PA ([facility] in [place]) dated 01-24-2020 in care everywhere;  (04-01-2021 per pt has appt w/ new cardiologist , dr j. branch 04-30-2021)   first dx 2020--  had event monitor/ stress test/ echo all results in care everywhere (monitor 08-03-2019 short runs AFib conversion pause <2 secconds, rate controlled;  echo 12-14-2019 ef 60%, mild concentric LVH, G2DD, RSVP 50.68mHg)   Personal history of radiation therapy 2013   left breast cancer,  completed 02/ 2014   PONV (postoperative nausea and vomiting)    Sarcoidosis, lung ([facility] pulmonology--- dr sHalford Chessman  04-01-2021  per pt dx age [age]s, no issue since   Sepsis secondary to UTI ([place] 03/27/2022   Thrombocytopenia ([facility]    hematology/ oncology--- dr s. kDelton Coombes  VIN III (vulvar intraepithelial neoplasia III)    Past Surgical History:  Past Surgical History:  Procedure Laterality Date  ABDOMINAL HYSTERECTOMY  1989   W/  UNILATERAL SALPINGOOPHORECTOMY   APPENDECTOMY  1989   BREAST SURGERY  09/15/2012   CATARACT EXTRACTION W/ INTRAOCULAR LENS  IMPLANT, BILATERAL  2016   CHOLECYSTECTOMY OPEN  2993   CO2 LASER APPLICATION N/A 71/69/6789   Procedure: CO2 LASER APPLICATION OF THE VULVA;  Surgeon: Everitt Amber, MD;  Location: [facility];  Service: Gynecology;  Laterality: N/A;   COLONOSCOPY WITH ESOPHAGOGASTRODUODENOSCOPY (EGD)  last one 08/ 2018  '@[facility]'$    EYE SURGERY  2015   Cataract removed both eyes   IR  IMAGING GUIDED PORT INSERTION  12/25/2022   LUMBAR LAMINECTOMY  2009   PARTIAL MASTECTOMY WITH AXILLARY SENTINEL LYMPH NODE BIOPSY Left 09-15-2012   [place]   TUBAL LIGATION  08/08/1969   HPI:  Patient is a 82 y.o. female with PMH: NASH cirrhosis, a-fib, iron deficiency anemia due to chronic blood loss, hemorrhoids with bleeding, Graves' disease, hyperlipidemia, CKD 3B, chronic hypotension on midodrine, who presented to [facility] ED from home via EMS due to progressive dyspnea with minimal exertion for the past 3 weeks.  In ED, patient was hypotensive, UA positive for pyuria, she was severely volume overloaded, CTA chest negative for pulmonary embolism and positive for moderate left pleural effusion, CT abd/pelvis showed increased generalized anasarca.    Assessment / Plan / Recommendation  Clinical Impression  Patient did not present with any clinical s/s of dysphagia as per this bedside swallow evaluation, although was limited by patient only wanting liquids. SLP discussed recent PO intake with patient and she reported her dentures do not fit anymore and for the past approximately, she only drinks liquid nutrition. She stated that for special occasions she will eat some soft solids (Thanksgiving, etc). When asked about purees like yogurt, etc, patient said she does not like the texture. She also stated that Ensure supplement shakes give her diarrhea so she avoids them. Patient appeared to be a good historian but no family present. SLP will follow briefly to ensure patient is tolerating PO's and if she is able or willing to try some solids. Her preference is liquids but she could be upgraded to Dys 1(puree) or Dys 2 (minced) by MD/RN. SLP Visit Diagnosis: Dysphagia, unspecified (R13.10)    Aspiration Risk  No limitations    Diet Recommendation Other (Comment) (Patient's preference is for liquids only but she could likely tolerate Dys 1 (puree) or 2 (minced))   Liquid Administration via:  Cup;Straw Medication Administration: Whole meds with liquid Supervision: Patient able to self feed Compensations: Slow rate;Small sips/bites Postural Changes: Seated upright at 90 degrees    Other  Recommendations Oral Care Recommendations: Oral care BID    Recommendations for follow up therapy are one component of a multi-disciplinary discharge planning process, led by the attending physician.  Recommendations may be updated based on patient status, additional functional criteria and insurance authorization.  Follow up Recommendations No SLP follow up      Assistance Recommended at Discharge    Functional Status Assessment Patient has had a recent decline in their functional status and demonstrates the ability to make significant improvements in function in a reasonable and predictable amount of time.  Frequency and Duration min 1 x/week  1 week       Prognosis Prognosis for Safe Diet Advancement: Fair Barriers to Reach Goals: Other (Comment) (patient's preferences)      Swallow Study   General Date of Onset: 01/05/23 HPI: Patient is a 82 y.o. female  with PMH: NASH cirrhosis, a-fib, iron deficiency anemia due to chronic blood loss, hemorrhoids with bleeding, Graves' disease, hyperlipidemia, CKD 3B, chronic hypotension on midodrine, who presented to [facility] ED from home via EMS due to progressive dyspnea with minimal exertion for the past 3 weeks.  In ED, patient was hypotensive, UA positive for pyuria, she was severely volume overloaded, CTA chest negative for pulmonary embolism and positive for moderate left pleural effusion, CT abd/pelvis showed increased generalized anasarca. Type of Study: Bedside Swallow Evaluation Previous Swallow Assessment: none found Diet Prior to this Study: Thin liquids;Other (Comment) (full liquids) Temperature Spikes Noted: No Respiratory Status: Room air History of Recent Intubation: No Behavior/Cognition: Alert;Cooperative;Pleasant mood Oral Cavity  Assessment: Within Functional Limits Oral Care Completed by SLP: No Oral Cavity - Dentition: Edentulous;Other (Comment) (dentures do not fit anymore per patient because she has lost a lot of weight) Vision: Functional for self-feeding Self-Feeding Abilities: Able to feed self Patient Positioning: Upright in bed Baseline Vocal Quality: Normal Volitional Cough: Strong Volitional Swallow: Able to elicit    Oral/Motor/Sensory Function Overall Oral Motor/Sensory Function: Within functional limits   Ice Chips     Thin Liquid Thin Liquid: Within functional limits Presentation: Straw;Self Fed    Nectar Thick     Honey Thick     Puree Puree: Not tested   Solid     Solid: Not tested      Sonia Baller, MA, CCC-SLP Speech Therapy

## 2023-01-05 NOTE — Progress Notes (Signed)
Daily Progress Note   Patient Name: Tasha Roberts       Date: 01/05/2023 DOB: 1940/12/26  Age: 82 y.o. MRN#: [id number] Attending Physician: Julian Hy, DO Primary Care Physician: Lindell Spar, MD Admit Date: 01/03/2023  Reason for Consultation/Follow-up: Establishing goals of care  Subjective: Awake alert, remains on pressors. TOC following, PT has recommended SNF rehab.   Length of Stay: 1  Current Medications: Scheduled Meds:   atorvastatin  20 mg Oral Daily   Chlorhexidine Gluconate Cloth  6 each Topical Daily   cyanocobalamin  500 mcg Oral Daily   feeding supplement  237 mL Oral TID BM   Gerhardt's butt cream   Topical TID   heparin injection (subcutaneous)  5,000 Units Subcutaneous Q8H   insulin aspart  0-5 Units Subcutaneous QHS   insulin aspart  0-9 Units Subcutaneous TID WC   levothyroxine  100 mcg Oral QAC breakfast   midodrine  15 mg Oral TID WC   And   ondansetron (ZOFRAN) IV  4 mg Intravenous TID with meals   pantoprazole (PROTONIX) IV  40 mg Intravenous Daily   sodium chloride flush  10-40 mL Intracatheter Q12H    Continuous Infusions:  sodium chloride 10 mL/hr at 01/05/23 1351   ceFEPime (MAXIPIME) IV Stopped (01/04/23 2050)   norepinephrine (LEVOPHED) Adult infusion 13 mcg/min (01/05/23 1351)   vasopressin 0.03 Units/min (01/05/23 1351)    PRN Meds: docusate sodium, ipratropium-albuterol, ondansetron (ZOFRAN) IV, mouth rinse, oxyCODONE, polyethylene glycol, sodium chloride flush  Physical Exam         General - chronically ill-appearing woman lying in bed  No distress  Regular work of breathing S 1 S 2  Reduced breath sounds Abdomen - soft, non tender   Extremities - ++edema, weeping LE Skin - bruising on arms and legs Neuro - awake & alert,  globally weak, answers questions appropriately   Vital Signs: BP (!) 117/39   Pulse (!) 59   Temp (!) 97.4 F (36.3 C) (Oral)   Resp 13   Ht '5\' 3"'$  (1.6 m)   Wt 68 kg   SpO2 100%   BMI 26.56 kg/m  SpO2: SpO2: 100 % O2 Device: O2 Device: Room Air O2 Flow Rate: O2 Flow Rate (L/min): 2 L/min  Intake/output summary:  Intake/Output Summary (Last 24 hours) at  01/05/2023 1440 Last data filed at 01/05/2023 1351 Gross per 24 hour  Intake 3229.25 ml  Output 1550 ml  Net 1679.25 ml   LBM: Last BM Date : 01/05/23 Baseline Weight: Weight: 68 kg Most recent weight: Weight: 68 kg       Palliative Assessment/Data:      Patient Active Problem List   Diagnosis Date Noted   Decompensated hepatic cirrhosis ([place]) 01/04/2023   Septic shock ([place]) 01/04/2023   Other ascites 01/04/2023   Pressure injury of skin 01/04/2023   Hypotension 10/23/2022   Acute anemia 10/23/2022   Extremity edema 10/23/2022   Acute kidney injury superimposed on chronic kidney disease ([place]) 10/23/2022   Colitis 10/16/2022   Diarrhea 10/15/2022   Urge incontinence of urine 09/01/2022   Oral phase dysphagia 09/01/2022   Moderate protein-calorie malnutrition ([place]) 09/01/2022   Skin ulcer of sacrum, limited to breakdown of skin ([place]) 08/12/2022   Closed displaced fracture of sixth cervical vertebra with routine healing 08/06/2022   Chronic anemia 07/27/2022   UTI (urinary tract infection) 07/25/2022   Compression fracture of L2 ([place]) 07/25/2022   Abdominal cramping 04/07/2022   Left ureteral stone 03/28/2022   Nausea & vomiting 03/28/2022   Serrated adenoma of colon 01/09/2022   Depression, major, single episode, moderate ([place]) 12/05/2021   Gastroesophageal reflux disease 12/05/2021   Physical deconditioning 12/05/2021   Mixed hyperlipidemia 12/05/2021   Essential hypertension 07/31/2021   Age related osteoporosis 07/31/2021   Acquired hypothyroidism 07/31/2021   Chronic diarrhea 07/31/2021   Vulvar  intraepithelial neoplasia (VIN) grade 3 03/20/2021   Iron deficiency anemia due to chronic blood loss 03/04/2021   Thrombocytopenia ([place]) 03/04/2021   Acquired thrombophilia ([place]) 02/21/2021   CAD (coronary artery disease)    Lichen planus 81/82/9937   AF (paroxysmal atrial fibrillation) ([place]) 10/02/2020   Liver cirrhosis secondary to NASH ([place]) 10/02/2020   Graves disease 16/96/7893   Nonalcoholic steatohepatitis ([place]) 10/12/2012   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 female  with past medical history of chronic hypotension on midodrine, NASH with cirrhosis, A-fib on Eliquis, breast cancer status postmastectomy and radiation in 2012, stage IV CKD, iron deficiency anemia, hypothyroidism, hypertension, dyslipidemia, osteoporosis, depression, type 2 diabetes, GERD, Graves' disease admitted on 01/03/2023 with weakness shortness of breath Nash cirrhosis with ascites.   Patient admitted to critical care service, CT scan of the chest and abdomen pelvis on 1-27 showing atherosclerosis, calcified mitral ring, moderate left effusion, cirrhotic liver, calcified granulomas right lobe of liver and spleen, absent gallbladder and atrophic pancreas, large volume ascites. Patient remains admitted to critical care medicine service for hypotension, NASH with cirrhosis and ascites, possible sepsis from urinary source, chronic left pleural effusion.  Patient had trauma after hitting her leg on a wheelchair and had right lower leg skin tear. Eliquis is being held because of oozing from leg wound, she is to undergo paracentesis and ascitic fluid analysis, she is on pressors and midodrine. Palliative medicine consultation has been requested for ongoing goals of care  discussions.  Recommendations/Plan:  Palliative care continues to follow, patient is on pressors, underwent paracentesis on 01-04-23, as per [place] discussions with daughter at time of initial consult: continue current mode of care for now. PT has recommended SNF rehab, recommend  palliative care on discharge.     Code Status:    Code Status Orders  (From admission, onward)           Start     Ordered   01/04/23 0405  Do not attempt resuscitation (DNR)  Continuous       Question Answer Comment  If patient has no pulse and is not breathing Do Not Attempt Resuscitation   If patient has a pulse and/or is breathing: Medical Treatment Goals MEDICAL INTERVENTIONS DESIRED: Use advanced airway interventions, mechanical ventilation or cardioversion in appropriate circumstances; Use medication/IV fluids as indicated; Provide comfort medications; Transfer to Progressive/Stepdown/ICU as indicated.   Consent: Discussion documented in EHR or advanced directives reviewed      01/04/23 0406           Code Status History     Date Active Date Inactive Code Status Order ID Comments User Context   01/04/2023 0030 01/04/2023 0406 Full Code [id number]  Kayleen Memos, DO ED   12/25/2022 1218 12/26/2022 0517 Full Code [id number]  Aletta Edouard, MD [facility]   10/23/2022 2233 10/25/2022 2051 Full Code [id number]  Bethena Roys, MD Inpatient   10/15/2022 1759 10/20/2022 1937 Full Code [id number]  Elgergawy, Silver Huguenin, MD ED   07/25/2022 0542 07/28/2022 2212 Full Code [id number]  Menard, Colt, DO Inpatient   03/28/2022 0132 03/30/2022 1544 Full Code [id number]  Bernadette Hoit, DO ED   04/04/2021 0822 04/04/2021 1751 Full Code [id number]  Dorothyann Gibbs, NP Inpatient   02/21/2021 0740 02/22/2021 1629 Full Code [id number]  Murlean Iba, MD ED   10/02/2020 1018 10/03/2020 2329 Full Code [id number]  Orson Eva, MD ED   10/01/2020 2107 10/02/2020 1018 DNR [id number]  Rolla Plate, DO ED      Advance  Directive Documentation    Flowsheet Row Most Recent Value  Type of Advance Directive Healthcare Power of Attorney  Pre-existing out of facility DNR order (yellow form or pink MOST form) --  "MOST" Form in Place? --       Prognosis:  Unable to determine  Discharge Planning: To Be Determined  Care plan was discussed with  patient   Thank you for allowing the Palliative Medicine Team to assist in the care of this patient.  Low MDM     Greater than 50%  of this time was spent counseling and coordinating care related to the above assessment and plan.  Loistine Chance, MD  Please contact Palliative Medicine Team phone at [phone] for questions and concerns.

## 2023-01-05 NOTE — TOC Initial Note (Signed)
Transition of Care [facility]) - Initial/Assessment Note    Patient Details  Name: Tasha Roberts MRN: [id number] Date of Birth: 1941/02/16  Transition of Care [facility]) CM/SW Contact:    Leeroy Cha, RN Phone Number: 01/05/2023, 8:05 AM  Clinical Narrative:                  Transition of Care [facility]) Screening Note   Patient Details  Name: Tasha Roberts Date of Birth: 06/30/1941   Transition of Care [facility]) CM/SW Contact:    Leeroy Cha, RN Phone Number: 01/05/2023, 8:05 AM    Transition of Care [facility]) has reviewed patient and no TOC needs have been identified at this time. We will continue to monitor patient advancement through interdisciplinary progression rounds. If new patient transition needs arise, please place a TOC consult.    Expected Discharge Plan: Home/Self Care Barriers to Discharge: Continued Medical Work up   Patient Goals and CMS Choice Patient states their goals for this hospitalization and ongoing recovery are:: to return home CMS Medicare.gov Compare Post Acute Care list provided to:: Patient Choice offered to / list presented to : Patient      Expected Discharge Plan and Services   Discharge Planning Services: CM Consult   Living arrangements for the past 2 months: Single Family Home                                      Prior Living Arrangements/Services Living arrangements for the past 2 months: Single Family Home Lives with:: Self (widowed) Patient language and need for interpreter reviewed:: Yes                 Activities of Daily Living      Permission Sought/Granted                  Emotional Assessment Appearance:: Appears stated age   Affect (typically observed): Calm Orientation: : Oriented to Self, Oriented to Place, Oriented to  Time, Oriented to Situation Alcohol / Substance Use: Never Used Psych Involvement: No (comment)  Admission diagnosis:  Decompensated hepatic cirrhosis ([place]) [K72.90,  K74.60] Septic shock ([place]) [A41.9, R65.21] Patient Active Problem List   Diagnosis Date Noted   Decompensated hepatic cirrhosis ([place]) 01/04/2023   Septic shock ([place]) 01/04/2023   Other ascites 01/04/2023   Pressure injury of skin 01/04/2023   Hypotension 10/23/2022   Acute anemia 10/23/2022   Extremity edema 10/23/2022   Acute kidney injury superimposed on chronic kidney disease ([place]) 10/23/2022   Colitis 10/16/2022   Diarrhea 10/15/2022   Urge incontinence of urine 09/01/2022   Oral phase dysphagia 09/01/2022   Moderate protein-calorie malnutrition ([place]) 09/01/2022   Skin ulcer of sacrum, limited to breakdown of skin ([place]) 08/12/2022   Closed displaced fracture of sixth cervical vertebra with routine healing 08/06/2022   Chronic anemia 07/27/2022   UTI (urinary tract infection) 07/25/2022   Compression fracture of L2 ([place]) 07/25/2022   Abdominal cramping 04/07/2022   Left ureteral stone 03/28/2022   Nausea & vomiting 03/28/2022   Serrated adenoma of colon 01/09/2022   Depression, major, single episode, moderate ([place]) 12/05/2021   Gastroesophageal reflux disease 12/05/2021   Physical deconditioning 12/05/2021   Mixed hyperlipidemia 12/05/2021   Essential hypertension 07/31/2021   Age related osteoporosis 07/31/2021   Acquired hypothyroidism 07/31/2021   Chronic diarrhea 07/31/2021   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  Iron deficiency anemia due to chronic blood loss 03/04/2021   Thrombocytopenia ([place]) 03/04/2021   Acquired thrombophilia ([place]) 02/21/2021   CAD (coronary artery disease)    Lichen planus 87/86/7672   AF (paroxysmal atrial fibrillation) ([place]) 10/02/2020   Liver cirrhosis secondary to NASH ([place]) 10/02/2020   Graves disease 09/47/0962   Nonalcoholic steatohepatitis (NASH) 10/12/2012   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   Vitamin D deficiency 04/13/2007   DM II (diabetes mellitus, type II), controlled ([place]) 08/27/2005    Sarcoidosis 1965   PCP:  Lindell Spar, MD Pharmacy:   [facility] [place] [address] [place] [address] Phone: [phone] Fax: [phone]     Social Determinants of Health (SDOH) Social History: [place]: No Food Insecurity (10/23/2022)  Housing: Low Risk  (10/23/2022)  Transportation Needs: No Transportation Needs (10/23/2022)  Utilities: Not At Risk (10/23/2022)  Alcohol Screen: Low Risk  (08/18/2022)  Depression (PHQ2-9): Low Risk  (09/01/2022)  Financial Resource Strain: Low Risk  (08/18/2022)  Physical Activity: Insufficiently Active (08/18/2022)  Social Connections: Moderately Isolated (08/18/2022)  Stress: No Stress Concern Present (08/18/2022)  Tobacco Use: Low Risk  (01/04/2023)   SDOH Interventions:     Readmission Risk Interventions   Row Labels 10/24/2022   10:48 AM 07/25/2022    3:44 PM  Readmission Risk Prevention Plan   Section Header. No data exists in this row.    Transportation Screening   Complete Complete  HRI or Home Care Consult   Complete Complete  Social Work Consult for [place] Planning/Counseling   Complete Complete  Palliative Care Screening    Complete  Medication Review Press photographer)   Complete Complete

## 2023-01-05 NOTE — Progress Notes (Signed)
NAME:  Tasha Roberts, MRN:  [id number], DOB:  18-Apr-1941, LOS: 1 ADMISSION DATE:  01/03/2023, CONSULTATION DATE:  01/04/2023 REFERRING MD:  Dr. Nevada Crane, [facility], CHIEF COMPLAINT:  Hypotension   History of Present Illness:  82 yo female with NASH with cirrhosis and ascites presented to the ER with weakness and dyspnea.  Found to have low blood pressure.  Concern for sepsis and she was started on Abx, and pressors.    Pertinent  Medical History  Chronic hypotension on midodrine, MASH with cirrhosis, A fib on eliquis, Breast cancer s/p mastectomy and XRT in 2012, CKD 4, IDA, Hypothyroidism, HTN, HLD, Osteoporosis, Bronchiectasis, Depression, DM type 2, GERD, Grave's disease, HLD, Sarcoidosis  Significant Hospital Events: Including procedures, antibiotic start and stop dates in addition to other pertinent events   1/27 Admit, start levophed 1/28 transfer 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 Escalating NE requirements overnight. Afebrile but mildly hypothermic in the 97s.   Objective   Blood pressure (!) 106/35, pulse (!) 57, temperature 98 F (36.7 C), temperature source Oral, resp. rate 13, height '5\' 3"'$  (1.6 m), weight 68 kg, SpO2 98 %.        Intake/Output Summary (Last 24 hours) at 01/05/2023 0754 Last data filed at 01/05/2023 0500 Gross per 24 hour  Intake 2772.77 ml   Output 1550 ml  Net 1222.77 ml    Filed Weights   01/03/23 2257  Weight: 68 kg    Examination: General - chronically ill-appearing woman lying in bed in NAD HEENT: [place]/AT, eyes anicteric Cardiac - S1S2, RRR Chest - breathing comfortably on [place], reduced basilar breath sounds, CTAB anteriorly. Abdomen - soft, NT. No bleeding or bruising around para site.  Extremities - ++edema, weeping LE Skin - bruising on arms and legs Neuro - awake & alert, globally weak, answering questions appropriately  Paracentesis culture: NGTD urine culture: NGTD Blood culture (only 1 site collected): NGTD  Peritoneal fluid: WBC 46, 70% monos Albumin <1.5g Total protein <3g LDH not collected  Na+ 130 K+ 2.9 Bicarb 19 BUN 25 Cr 1.74 H/H 8.6/25.4 INR 2.6  Resolved Hospital Problem list   Lactic acidosis  Assessment & Plan:   Hypotension- difficult to determine if there is an acute change or if this is related to progression of her chronic medical conditions. She reports an intolerance to midodrine, which will make her long-term treatment a challenge. No obvious source of infection if this is sepsis. Adrenal insufficiency is possible. - continue pressors to keep MAP > 65, SBP >85. Unfortunately with AKI she needs higher pressures to ensure adequate renal perfusion. - continue midodrine -- dose increased to '15mg'$  TID & added scheduled IV zofran to help her tolerate -con't antibiotics, follow cultures until finalized.  NASH with cirrhosis and ascites; MELD-Na+= 27 Coagulopathy due to liver disease, worsening Hyponatremia, hypervolemic - followed at [facility] liver clinic - follow para cultures until finalized -vit k '10mg'$  today -unable to start spironolactone or lasix for  volume management due to AKI  Possible sepsis from urinary source; mild pyuria on UA -con't empiric cefepime - follow cultures  AKI-- concern for HRS vs sepsis-associated AKI. Baseline CKD 4. -renally dose meds, avoid  nephrotoxic meds -strict I/O  Hypothyroidism -repeat TSH today -con't synthroid -check cortisol  Chronic left pleural effusion-present since Oct 2023. -trying to avoid significant procedures until her BP stabilized more and coagulopathy improves  History of atrial fibrillation Hx of CAD, HLD - holding PTA Eliquis due to oozing from her leg wound, also has coagulopathy and may need additional ICU procedures -can start DVT prophylaxis  Rt lower leg skin tear- from trauma after hitting her leg on a wheelchair -wound care -goal euvolemia to slow down weeping  IDA -transfuse for Hb <7 or hemodynamically significant bleeding -monitor  DM type 2 with hyperglycemia, A1c 3.7 -SSI PRN -goal BG 140-180 -worry with low A1c that she is having hypoglycemia at home  CKD 4 Hypokalemia Non gap metabolic acidosis - f/u BMET - monitor urine outpt  Moderate protein calorie malnutrition -encourage PO intake -Ensure TID  Remote hx of sarcoidosis with bronchiectasis. -pulmonary hygiene  Goals of care - palliative care consulted previously  Best Practice (right click and "Reselect all SmartList Selections" daily)   Diet/type: Regular consistency (see orders) DVT prophylaxis: prophylactic heparin  GI prophylaxis: PPI Lines: Central line, has port from PTA Foley:  N/A Code Status:  DNR Last date of multidisciplinary goals of care discussion '[x]'$   Labs       Latest Ref Rng & Units 01/05/2023    4:51 AM 01/04/2023    4:05 AM 01/03/2023    7:27 PM  CMP  Glucose 70 - 99 mg/dL 168  153  118   BUN 8 - 23 mg/dL '25  29  27   '$ Creatinine 0.44 - 1.00 mg/dL 1.74  1.73  1.68   Sodium 135 - 145 mmol/L 130  136  138   Potassium 3.5 - 5.1 mmol/L 2.9  3.4  3.5   Chloride 98 - 111 mmol/L 103  109  109   CO2 22 - 32 mmol/L '19  18  19   '$ Calcium 8.9 - 10.3 mg/dL 7.1  7.5  7.7   Total Protein 6.5 - 8.1 g/dL 3.7  4.0  4.3   Total Bilirubin 0.3 - 1.2 mg/dL 1.2  1.0  0.9   Alkaline Phos 38 - 126  U/L 59  65  69   AST 15 - 41 U/L 29  31  33   ALT 0 - 44 U/L '15  15  14        '$ Latest Ref Rng & Units 01/05/2023    4:51 AM 01/04/2023    4:59 PM 01/04/2023    4:05 AM  CBC  WBC 4.0 - 10.5 K/uL 9.9   7.3   Hemoglobin 12.0 - 15.0 g/dL 8.6  6.8  7.4   Hematocrit 36.0 - 46.0 % 25.4  20.5  22.6   Platelets 150 - 400 K/uL 159   201     ABG No results found for: "PHART", "PCO2ART", "PO2ART", "HCO3", "TCO2", "ACIDBASEDEF", "O2SAT"  CBG (last 3)  Recent Labs    01/04/23 1606 01/04/23 2116 01/05/23 0749  GLUCAP 168* 134* 167*     Critical care time:     This patient is critically ill with multiple organ system failure which requires frequent high complexity decision making, assessment, support, evaluation, and titration of therapies. This was completed through the  application of advanced monitoring technologies and extensive interpretation of multiple databases. During this encounter critical care time was devoted to patient care services described in this note for 36 minutes.  Julian Hy, DO 01/05/23 8:39 AM [facility]  For contact information, see Amion. If no response to pager, please call PCCM consult pager. After hours, 7PM- 7AM, please call [place].

## 2023-01-06 ENCOUNTER — Encounter (HOSPITAL_COMMUNITY): Payer: Self-pay | Admitting: Critical Care Medicine

## 2023-01-06 DIAGNOSIS — E8770 Fluid overload, unspecified: Secondary | ICD-10-CM | POA: Diagnosis not present

## 2023-01-06 DIAGNOSIS — K7581 Nonalcoholic steatohepatitis (NASH): Secondary | ICD-10-CM | POA: Diagnosis not present

## 2023-01-06 DIAGNOSIS — R579 Shock, unspecified: Secondary | ICD-10-CM | POA: Diagnosis not present

## 2023-01-06 DIAGNOSIS — N179 Acute kidney failure, unspecified: Secondary | ICD-10-CM | POA: Diagnosis not present

## 2023-01-06 DIAGNOSIS — I959 Hypotension, unspecified: Secondary | ICD-10-CM | POA: Diagnosis not present

## 2023-01-06 DIAGNOSIS — R4589 Other symptoms and signs involving emotional state: Secondary | ICD-10-CM

## 2023-01-06 DIAGNOSIS — Z7189 Other specified counseling: Secondary | ICD-10-CM

## 2023-01-06 DIAGNOSIS — K7290 Hepatic failure, unspecified without coma: Secondary | ICD-10-CM | POA: Diagnosis not present

## 2023-01-06 DIAGNOSIS — K7460 Unspecified cirrhosis of liver: Secondary | ICD-10-CM | POA: Diagnosis not present

## 2023-01-06 DIAGNOSIS — Z515 Encounter for palliative care: Secondary | ICD-10-CM | POA: Diagnosis not present

## 2023-01-06 HISTORY — DX: Encounter for palliative care: Z51.5

## 2023-01-06 LAB — CBC WITH DIFFERENTIAL/PLATELET
Abs Immature Granulocytes: 0.04 10*3/uL (ref 0.00–0.07)
Basophils Absolute: 0 10*3/uL (ref 0.0–0.1)
Basophils Relative: 0 %
Eosinophils Absolute: 0.3 10*3/uL (ref 0.0–0.5)
Eosinophils Relative: 4 %
HCT: 23 % — ABNORMAL LOW (ref 36.0–46.0)
Hemoglobin: 7.8 g/dL — ABNORMAL LOW (ref 12.0–15.0)
Immature Granulocytes: 1 %
Lymphocytes Relative: 17 %
Lymphs Abs: 1.5 10*3/uL (ref 0.7–4.0)
MCH: 33.2 pg (ref 26.0–34.0)
MCHC: 33.9 g/dL (ref 30.0–36.0)
MCV: 97.9 fL (ref 80.0–100.0)
Monocytes Absolute: 1.2 10*3/uL — ABNORMAL HIGH (ref 0.1–1.0)
Monocytes Relative: 14 %
Neutro Abs: 5.7 10*3/uL (ref 1.7–7.7)
Neutrophils Relative %: 64 %
Platelets: 119 10*3/uL — ABNORMAL LOW (ref 150–400)
RBC: 2.35 MIL/uL — ABNORMAL LOW (ref 3.87–5.11)
RDW: 20 % — ABNORMAL HIGH (ref 11.5–15.5)
WBC: 8.8 10*3/uL (ref 4.0–10.5)
nRBC: 0 % (ref 0.0–0.2)

## 2023-01-06 LAB — CYTOLOGY - NON PAP

## 2023-01-06 LAB — BASIC METABOLIC PANEL
Anion gap: 5 (ref 5–15)
BUN: 29 mg/dL — ABNORMAL HIGH (ref 8–23)
CO2: 18 mmol/L — ABNORMAL LOW (ref 22–32)
Calcium: 6.9 mg/dL — ABNORMAL LOW (ref 8.9–10.3)
Chloride: 105 mmol/L (ref 98–111)
Creatinine, Ser: 1.69 mg/dL — ABNORMAL HIGH (ref 0.44–1.00)
GFR, Estimated: 31 mL/min — ABNORMAL LOW (ref >60)
Glucose, Bld: 167 mg/dL — ABNORMAL HIGH (ref 70–99)
Potassium: 4 mmol/L (ref 3.5–5.1)
Sodium: 128 mmol/L — ABNORMAL LOW (ref 135–145)

## 2023-01-06 LAB — TYPE AND SCREEN
ABO/RH(D): AB POS
Antibody Screen: NEGATIVE
Unit division: 0

## 2023-01-06 LAB — GLUCOSE, CAPILLARY
Glucose-Capillary: 151 mg/dL — ABNORMAL HIGH (ref 70–99)
Glucose-Capillary: 136 mg/dL — ABNORMAL HIGH (ref 70–99)
Glucose-Capillary: 138 mg/dL — ABNORMAL HIGH (ref 70–99)
Glucose-Capillary: 153 mg/dL — ABNORMAL HIGH (ref 70–99)

## 2023-01-06 LAB — BPAM RBC
Blood Product Expiration Date: 202402202359
ISSUE DATE / TIME: 202401290017
Unit Type and Rh: 6200

## 2023-01-06 LAB — PHOSPHORUS: Phosphorus: 3 mg/dL (ref 2.5–4.6)

## 2023-01-06 LAB — MAGNESIUM: Magnesium: 1.7 mg/dL (ref 1.7–2.4)

## 2023-01-06 MED ORDER — MUPIROCIN 2 % EX OINT
1.0000 | TOPICAL_OINTMENT | Freq: Two times a day (BID) | CUTANEOUS | Status: DC
  Administered 2023-01-06 – 2023-01-07 (×3): 1 via NASAL
  Filled 2023-01-06: qty 22

## 2023-01-06 MED ORDER — HYDROCORTISONE 20 MG PO TABS
100.0000 mg | ORAL_TABLET | Freq: Two times a day (BID) | ORAL | Status: DC
  Administered 2023-01-06 – 2023-01-07 (×3): 100 mg via ORAL
  Filled 2023-01-06 (×5): qty 5

## 2023-01-06 MED ORDER — ALBUMIN HUMAN 5 % IV SOLN
25.0000 g | Freq: Once | INTRAVENOUS | Status: AC
  Administered 2023-01-06: 25 g via INTRAVENOUS
  Filled 2023-01-06: qty 500

## 2023-01-06 MED ORDER — PROSOURCE PLUS PO LIQD
30.0000 mL | Freq: Two times a day (BID) | ORAL | Status: DC
  Filled 2023-01-06: qty 30

## 2023-01-06 MED ORDER — LEVOTHYROXINE SODIUM 25 MCG PO TABS
150.0000 ug | ORAL_TABLET | Freq: Every day | ORAL | Status: DC
  Administered 2023-01-07 – 2023-01-08 (×2): 150 ug via ORAL
  Filled 2023-01-06 (×2): qty 2

## 2023-01-06 MED ORDER — MELATONIN 3 MG PO TABS
3.0000 mg | ORAL_TABLET | Freq: Every day | ORAL | Status: DC
  Filled 2023-01-06: qty 1

## 2023-01-06 MED ORDER — BOOST / RESOURCE BREEZE PO LIQD CUSTOM: 1.0000 | Freq: Three times a day (TID) | ORAL | Status: DC

## 2023-01-06 MED ORDER — MAGNESIUM SULFATE 2 GM/50ML IV SOLN
2.0000 g | Freq: Once | INTRAVENOUS | Status: AC
  Administered 2023-01-06: 2 g via INTRAVENOUS
  Filled 2023-01-06: qty 50

## 2023-01-06 MED ORDER — FUROSEMIDE 10 MG/ML IJ SOLN
40.0000 mg | Freq: Once | INTRAMUSCULAR | Status: AC
  Administered 2023-01-06: 40 mg via INTRAVENOUS
  Filled 2023-01-06: qty 4

## 2023-01-06 NOTE — Progress Notes (Signed)
Daily Progress Note   Patient Name: Tasha Roberts       Date: 01/06/2023 DOB: 1941-09-10  Age: 82 y.o. MRN#: [id number] Attending Physician: Julian Hy, DO Primary Care Physician: Lindell Spar, MD Admit Date: 01/03/2023 Length of Stay: 2 days  Reason for Consultation/Follow-up: Establishing goals of care  Subjective:   CC: States she is very sleepy. Following up regarding complex medical decision making.   Subjective: Reviewed EMR prior to presenting to bedside.  Patient remains hypotensive on vasopressin and Levophed.  Patient continues to receive broad-spectrum antibiotics without noted decrease in pressor requirements.  Also discussed care with bedside RN for updates to noted increase of Levophed today.  Patient continues to have AKI with creatinine of 1.69.  Patient has stated to staff wanting to be left alone to sleep.  Patient continues to have episodes of bradycardia as well.  Scented to bedside.  Patient sleeping comfortably in bed.  No family present at bedside during visit.  Patient easily awakens.  Introduced myself to member of the palliative medicine team.  Patient noted she is very sleepy today.  Patient denies any symptoms of pain or dyspnea.  Inquired about visits from other providers today though patient noted she has been so sleepy she does not remember any.  Continue to provide emotional support to patient and noted importance of continued discussions and allowing providers to hear her voice for medical care moving forward.  Patient does have ACP documentation on file though if patient is able to make her own decisions and has capacity to do so, encourage engagement in discussions.  Patient voiced appreciation for support and visit today.  Noted PMT will continue to follow along.  Review of Systems Sleepy  Objective:   Vital Signs:  BP (!) 101/44   Pulse 65   Temp 98.8 F (37.1 C) (Oral)   Resp 20   Ht '5\' 3"'$  (1.6 m)   Wt 68 kg   SpO2 99%   BMI 26.56 kg/m    Physical Exam: General: NAD, awake, laying in bed, chronically ill-appearing Eyes: moist mucous membranes Cardiovascular: Tachycardia noted with a rate in 40s when seen, anasarca present in all extremities with weeping from legs Respiratory: no increased work of breathing noted, not in respiratory distress Abdomen: Soft Extremities: Decreased strength in all extremities Skin: Extensive ecchymoses on upper extremities and lower extremities bilaterally Neuro: A&Ox4, following commands easily Psych: appropriately answers all questions  Imaging: I personally reviewed recent imaging.   Assessment & Plan:   Assessment:  Patient is a 82 year old female with a past medical history of chronic hypotension on midodrine, NASH cirrhosis, A-fib on Eliquis, stage IV CKD, iron deficiency anemia, hypothyroidism, dyslipidemia, osteoporosis, depression, type 2 diabetes, GERD, and Graves' disease who was admitted on 01/03/2023 for management of weakness and shortness of breath.  Since being admitted to the hospital patient has been admitted to the [facility] service.  Receiving support in ICU for hypotension, NASH cirrhosis with ascites, possible sepsis from urinary source, and management of chronic left pleural effusion.  Recommendations/Plan: # Complex medical decision making/goals of care:  - Patient noted being tired today though easily able to engage in conversation.  Provided emotional support for patient and allowed expression for patient.  Noted encouraged patient to advocate for medical care she feels is most appropriate for her.  Emphasized importance of continued conversations moving forward.  Noted would follow-up with patient's daughter/HCPOA as able.   -Patient does have ACP documentation on file naming her  HCPOA as Royetta Crochet and primary alternate as Dollene Cleveland.  -  Code Status: DNR  # Symptom management:  -As per primary team  # Psychosocial Support:  -Daughter  # Discharge  Planning: PT had initially recommended SNF for rehab though patient remains on two pressors currently and could not work with PT today. Will continue to monitor for disposition recommendations.   Discussed with: bedside RN, patient  Thank you for allowing the palliative care team to participate in the care Tasha Nearing, DO Palliative Care Provider PMT # [phone]  This provider spent a total of 36 minutes providing patient's care.  Includes review of EMR, discussing care with other staff members involved in patient's medical care, obtaining relevant history and information from patient and/or patient's family, and personal review of imaging and lab work. Greater than 50% of the time was spent counseling and coordinating care related to the above assessment and plan.

## 2023-01-06 NOTE — Progress Notes (Signed)
OT Cancellation Note  Patient Details Name: Tasha Roberts MRN: [id number] DOB: 07/06/1941   Cancelled Treatment:    Reason Eval/Treat Not Completed: Other (comment) Patient is on increased pressor support with map of 55. Per nurse patient is also asking to be left alone this AM. OT to continue to follow and check back as schedule will allow.  Rennie Plowman, [place] [facility] Department Office# [phone] 01/06/2023, 10:24 AM

## 2023-01-06 NOTE — Progress Notes (Signed)
[facility] ADULT ICU REPLACEMENT PROTOCOL   The patient does apply for the [facility] Adult ICU Electrolyte Replacment Protocol based on the criteria listed below:   1.Exclusion criteria: TCTS, ECMO, Dialysis, and Myasthenia Gravis patients 2. Is GFR >/= 30 ml/min? Yes.    Patient's GFR today is 31 3. Is SCr </= 2? Yes.   Patient's SCr is 1.69 mg/dL 4. Did SCr increase >/= 0.5 in 24 hours? No. 5.Pt's weight >40kg  Yes.   6. Abnormal electrolyte(s): mag 1.7  7. Electrolytes replaced per protocol 8.  Call MD STAT for K+ </= 2.5, Phos </= 1, or Mag </= 1 Physician:  n/a  Darlys Gales 01/06/2023 4:21 AM

## 2023-01-06 NOTE — Progress Notes (Signed)
NAME:  Tasha Roberts, MRN:  [id number], DOB:  May 28, 1941, LOS: 2 ADMISSION DATE:  01/03/2023, CONSULTATION DATE:  01/04/2023 REFERRING MD:  Dr. Nevada Crane, [facility], CHIEF COMPLAINT:  Hypotension   History of Present Illness:  82 yo female with NASH with cirrhosis and ascites presented to the ER with weakness and dyspnea.  Found to have low blood pressure.  Concern for sepsis and she was started on Abx, and pressors.    Pertinent  Medical History  Chronic hypotension on midodrine, MASH with cirrhosis, A fib on eliquis, Breast cancer s/p mastectomy and XRT in 2012, CKD 4, IDA, Hypothyroidism, HTN, HLD, Osteoporosis, Bronchiectasis, Depression, DM type 2, GERD, Grave's disease, HLD, Sarcoidosis  Significant Hospital Events: Including procedures, antibiotic start and stop dates in addition to other pertinent events   1/27 Admit, start levophed 1/28 transfer from [facility] ER to [facility] ICU; paracentesis & CVC  Studies:  CT angio chest 1/27 >> atherosclerosis, calcified mitral ring, 3 vessel coronary calcification, moderate Lt effusion w/o change from 09/2022 CT abd/pelvis 1/27 >> cirrhotic liver, calcified granulomas in Rt lobe of liver and spleen, absent GB, atrophic pancreas, 2 mm Lt upper pole kidney stone, small hiatal hernia, colonic diverticula, 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  Remains on 2 pressors- vaso, levo.  Objective   Blood pressure (!) 101/44, pulse 65, temperature 98.8 F (37.1 C), temperature source Oral, resp. rate 20, height '5\' 3"'$  (1.6 m), weight 68 kg, SpO2 99 %.        Intake/Output Summary (Last 24 hours) at 01/06/2023 0849 Last data filed at 01/06/2023 1829 Gross per 24 hour  Intake 2989.78 ml  Output 400 ml  Net 2589.78  ml    Filed Weights   01/03/23 2257  Weight: 68 kg    Examination: General - frail, cachectic, chronically ill appearing woman lying in bed in NAD HEENT: [place]/AT, eyes anicteric Cardiac - S1S2, RRR Chest - breathing comfortably on [place], CTAB Abdomen - soft, NT Extremities - ++ peripheral edema, no cyanosis. Weeping from legs.  Skin - extensive bruising on arms and legs Neuro - awake, alert, answering questions appropriately.  Paracentesis culture: NGTD urine culture: multiple species Blood culture (only 1 site collected): NGTD  Peritoneal fluid: WBC 46, 70% monos Albumin <1.5g Total protein <3g LDH not collected  Na+ 128 K+ 4.0 Bicarb 18 BUN 29 Cr 1.69 H/H 7.8/23 Cortisol 18.5  Resolved Hospital Problem list   Lactic acidosis Hypokalemia  Assessment & Plan:   Hypotension- difficult to determine if there is an acute change or if this is related to progression of her chronic medical conditions. She reports an intolerance to midodrine, which will make her long-term treatment a challenge. No obvious source of infection if this is sepsis. Adrenal insufficiency is possible. - con't vaso & norepi to maintain MAP >65 -intolerant of midodrine; vomited yesterday despite premedication with zofran -con't antibiotics; follow cultures until finalized  Concern for adrenal insufficiency -oral hydrocortisone  NASH with cirrhosis and ascites; MELD-Na+= 27 Coagulopathy due to liver disease, worsening Hyponatremia, hypervolemic anasarca - followed at [facility] - follow paracentesis cultures until finalized -recheck INR tomorrow -unstable to start spironolactone due to AKI  Possible sepsis from urinary source; mild pyuria on UA -follow cultures -empiric cefepime; without much improvement, not ready to deescalate yet  AKI-- concern for HRS vs sepsis-associated AKI. Baseline CKD 4. -renally dose meds, avoid nephrotoxic meds -monitor -stric  tI/O  Hypothyroidism -increase synthroid dose, recheck TSH in a few weeks  Chronic left pleural effusion-present since Oct 2023. -trying to avoid significant procedures until her BP stabilized more and coagulopathy improves -periodic CXR  History of atrial fibrillation Hx of CAD, HLD -hold PTA eliquis due to potential need for ICU procedures and coagulopathy  [place]to heparin for DVT prophylaxis  Rt lower leg skin tear- from trauma after hitting her leg on a wheelchair -wound care -lasix + albumin to help with diuresis today  IDA -transfuse for Hb <7 or hemodynamically significant bleeding -monitor  DM type 2 with hyperglycemia, A1c 3.7 -SSI PRN -goal BG 140-180 -worry with low A1c that she is having hypoglycemia at home  AKI on CKD 3b Non gap metabolic acidosis -monitor -strict I/O -renally dose meds, avoid nephrotoxic meds  Moderate protein calorie malnutrition -encourage PO intake -Ensure TID  Remote hx of sarcoidosis with bronchiectasis -pulmonary hygiene  Goals of care - palliative care consulted previously  Insomnia -melatonin QHS  Best Practice (right click and "Reselect all SmartList Selections" daily)   Diet/type: full liquids - per patient request DVT prophylaxis: prophylactic heparin  GI prophylaxis: PPI Lines: Central line, has port from PTA Foley:  N/A Code Status:  DNR Last date of multidisciplinary goals of care discussion '[x]'$   Labs       Latest Ref Rng & Units 01/06/2023    2:26 AM 01/05/2023    4:51 AM 01/04/2023    4:05 AM  CMP  Glucose 70 - 99 mg/dL 167  168  153   BUN 8 - 23 mg/dL '29  25  29   '$ Creatinine 0.44 - 1.00 mg/dL 1.69  1.74  1.73   Sodium 135 - 145 mmol/L 128  130  136   Potassium 3.5 - 5.1 mmol/L 4.0  2.9  3.4   Chloride 98 - 111 mmol/L 105  103  109   CO2 22 - 32 mmol/L '18  19  18   '$ Calcium 8.9 - 10.3 mg/dL 6.9  7.1  7.5   Total Protein 6.5 - 8.1 g/dL  3.7  4.0   Total Bilirubin 0.3 - 1.2 mg/dL  1.2  1.0   Alkaline Phos  38 - 126 U/L  59  65   AST 15 - 41 U/L  29  31   ALT 0 - 44 U/L  15  15        Latest Ref Rng & Units 01/06/2023    2:26 AM 01/05/2023    4:51 AM 01/04/2023    4:59 PM  CBC  WBC 4.0 - 10.5 K/uL 8.8  9.9    Hemoglobin 12.0 - 15.0 g/dL 7.8  8.6  6.8   Hematocrit 36.0 - 46.0 % 23.0  25.4  20.5   Platelets 150 - 400 K/uL 119  159      ABG No results found for: "PHART", "PCO2ART", "PO2ART", "HCO3", "TCO2", "ACIDBASEDEF", "O2SAT"  CBG (last 3)  Recent Labs    01/05/23 1124 01/05/23 1713 01/05/23 2219  GLUCAP 154* 203* 170*     Critical care time:     This patient is critically ill with multiple organ system failure which requires frequent high complexity decision making, assessment, support, evaluation, and titration of therapies. This was completed through the application of advanced monitoring technologies and extensive interpretation of multiple databases. During this encounter critical care time  was devoted to patient care services described in this note for 38 minutes.  Julian Hy, DO 01/06/23 9:50 AM [facility]  For contact information, see Amion. If no response to pager, please call PCCM consult pager. After hours, 7PM- 7AM, please call Elink.

## 2023-01-06 NOTE — TOC Progression Note (Signed)
Transition of Care [facility]) - Progression Note    Patient Details  Name: Tasha Roberts MRN: [id number] Date of Birth: July 28, 1941  Transition of [facility]) CM/SW Contact  Leeroy Cha, RN Phone Number: 01/06/2023, 7:44 AM  Clinical Narrative:    Accepted bed offers: Destination  Service Provider Request Status Selected Services Address Phone Fax Patient Preferred  [facility] Preferred SNF  Accepted N/A [address], [place] [address] [phone] --  [facility] Preferred SNF  Accepted N/A [address], [place] [address] [phone] --  [facility] Preferred SN[facility]  Accepted N/A [address] [place] [address] [place] --  [facility] Preferred SNF  Accepted N/A [address], [place] [address] [phone] --     Expected Discharge Plan: Home/Self Care Barriers to Discharge: Continued Medical Work up  Expected Discharge Plan and Services   Discharge Planning Services: CM Consult   Living arrangements for the past 2 months: Single Family Home                                       Social Determinants of Health (SDOH) Interventions SDOH Screenings   Food Insecurity: No Food Insecurity (10/23/2022)  Housing: Low Risk  (10/23/2022)  Transportation Needs: No Transportation Needs (10/23/2022)  Utilities: Not At Risk (10/23/2022)  Alcohol Screen: Low Risk  (08/18/2022)  Depression (PHQ2-9): Low Risk  (09/01/2022)  Financial Resource Strain: Low Risk  (08/18/2022)  Physical Activity: Insufficiently Active (08/18/2022)  Social Connections: Moderately Isolated (08/18/2022)  Stress: No Stress Concern Present (08/18/2022)  Tobacco Use: Low Risk  (01/04/2023)    Readmission Risk Interventions   Row Labels 10/24/2022   10:48 AM 07/25/2022    3:44 PM  Readmission Risk Prevention Plan   Section  Header. No data exists in this row.    Transportation Screening   Complete Complete  HRI or Home Care Consult   Complete Complete  Social Work Consult for [place] Planning/Counseling   Complete Complete  Palliative Care Screening    Complete  Medication Review Press photographer)   Complete Complete

## 2023-01-07 DIAGNOSIS — Z66 Do not resuscitate: Secondary | ICD-10-CM

## 2023-01-07 DIAGNOSIS — I959 Hypotension, unspecified: Secondary | ICD-10-CM | POA: Diagnosis not present

## 2023-01-07 DIAGNOSIS — K7290 Hepatic failure, unspecified without coma: Secondary | ICD-10-CM | POA: Diagnosis not present

## 2023-01-07 DIAGNOSIS — Z79899 Other long term (current) drug therapy: Secondary | ICD-10-CM

## 2023-01-07 DIAGNOSIS — R52 Pain, unspecified: Secondary | ICD-10-CM

## 2023-01-07 DIAGNOSIS — R579 Shock, unspecified: Secondary | ICD-10-CM

## 2023-01-07 DIAGNOSIS — K7581 Nonalcoholic steatohepatitis (NASH): Secondary | ICD-10-CM | POA: Diagnosis not present

## 2023-01-07 DIAGNOSIS — R451 Restlessness and agitation: Secondary | ICD-10-CM

## 2023-01-07 DIAGNOSIS — Z515 Encounter for palliative care: Secondary | ICD-10-CM | POA: Diagnosis not present

## 2023-01-07 DIAGNOSIS — R188 Other ascites: Secondary | ICD-10-CM | POA: Diagnosis not present

## 2023-01-07 DIAGNOSIS — K7460 Unspecified cirrhosis of liver: Secondary | ICD-10-CM | POA: Diagnosis not present

## 2023-01-07 DIAGNOSIS — E8770 Fluid overload, unspecified: Secondary | ICD-10-CM | POA: Diagnosis not present

## 2023-01-07 LAB — GLUCOSE, CAPILLARY: Glucose-Capillary: 215 mg/dL — ABNORMAL HIGH (ref 70–99)

## 2023-01-07 LAB — PROTIME-INR
INR: 1.9 — ABNORMAL HIGH (ref 0.8–1.2)
Prothrombin Time: 21.4 seconds — ABNORMAL HIGH (ref 11.4–15.2)

## 2023-01-07 MED ORDER — GLYCOPYRROLATE 0.2 MG/ML IJ SOLN: 0.2000 mg | INTRAMUSCULAR | Status: DC | PRN

## 2023-01-07 MED ORDER — BIOTENE DRY MOUTH MT LIQD: 15.0000 mL | OROMUCOSAL | Status: DC | PRN

## 2023-01-07 MED ORDER — POLYVINYL ALCOHOL 1.4 % OP SOLN: 1.0000 [drp] | Freq: Four times a day (QID) | OPHTHALMIC | Status: DC | PRN

## 2023-01-07 MED ORDER — HALOPERIDOL 0.5 MG PO TABS: 0.5000 mg | ORAL_TABLET | ORAL | Status: DC | PRN

## 2023-01-07 MED ORDER — GLYCOPYRROLATE 1 MG PO TABS: 1.0000 mg | ORAL_TABLET | ORAL | Status: DC | PRN

## 2023-01-07 MED ORDER — LORAZEPAM 2 MG/ML PO CONC: 1.0000 mg | ORAL | Status: DC | PRN

## 2023-01-07 MED ORDER — HALOPERIDOL LACTATE 2 MG/ML PO CONC
0.5000 mg | ORAL | Status: DC | PRN
  Filled 2023-01-07: qty 5

## 2023-01-07 MED ORDER — LORAZEPAM 2 MG/ML IJ SOLN
1.0000 mg | INTRAMUSCULAR | Status: DC | PRN
  Administered 2023-01-07: 1 mg via INTRAVENOUS
  Filled 2023-01-07: qty 1

## 2023-01-07 MED ORDER — LORAZEPAM 1 MG PO TABS: 1.0000 mg | ORAL_TABLET | ORAL | Status: DC | PRN

## 2023-01-07 MED ORDER — BOOST PLUS PO LIQD
237.0000 mL | Freq: Three times a day (TID) | ORAL | Status: DC
  Filled 2023-01-07: qty 237

## 2023-01-07 MED ORDER — HALOPERIDOL LACTATE 5 MG/ML IJ SOLN: 0.5000 mg | INTRAMUSCULAR | Status: DC | PRN

## 2023-01-07 MED ORDER — HYDROMORPHONE HCL 1 MG/ML IJ SOLN
0.2000 mg | INTRAMUSCULAR | Status: DC | PRN
  Administered 2023-01-07 – 2023-01-09 (×4): 0.2 mg via INTRAVENOUS
  Filled 2023-01-07: qty 1
  Filled 2023-01-07 (×3): qty 0.5

## 2023-01-07 NOTE — TOC Progression Note (Addendum)
Transition of Care [facility]) - Progression Note    Patient Details  Name: Tasha Roberts MRN: [id number] Date of Birth: 1941-08-04  Transition of Care [facility]) CM/SW Contact  Leeroy Cha, RN Phone Number: 01/07/2023, 7:33 AM  Clinical Narrative:     Destination  Service Provider Request Status Selected Services Address Phone Fax Patient Preferred  [facility] Preferred SNF  Accepted N/A [place], [place] [address] [phone] --  [facility] Preferred SNF  Accepted N/A [address] [place] [address] [phone] --  [facility] Preferred SNF  Accepted N/A [address], [place] [address] [phone] --  [facility] Preferred SNF  Accepted N/A [address] [place] [address] [phone] --  [facility] Preferred SNF  Accepted N/A [address], [place] [address] [phone] --  UPDATED ACCEPTED BED OFFERS. Patient is on iv pressors and not ready for snf decision. Expected Discharge Plan: Home/Self Care Barriers to Discharge: Continued Medical Work up  Expected Discharge Plan and Services   Discharge Planning Services: CM Consult   Living arrangements for the past 2 months: Single Family Home                                       Social Determinants of Health (SDOH) Interventions SDOH Screenings   Food Insecurity: No Food Insecurity (10/23/2022)  Housing: Low Risk  (10/23/2022)  Transportation Needs: No Transportation Needs (10/23/2022)  Utilities: Not At Risk (10/23/2022)  Alcohol Screen: Low Risk  (08/18/2022)  Depression (PHQ2-9): Low Risk  (09/01/2022)  Financial Resource Strain: Low Risk  (08/18/2022)  Physical Activity: Insufficiently Active (08/18/2022)  Social Connections: Moderately Isolated (08/18/2022)  Stress: No Stress Concern  Present (08/18/2022)  Tobacco Use: Low Risk  (01/06/2023)    Readmission Risk Interventions   Row Labels 10/24/2022   10:48 AM 07/25/2022    3:44 PM  Readmission Risk Prevention Plan   Section Header. No data exists in this row.    Transportation Screening   Complete Complete  HRI or Home Care Consult   Complete Complete  Social Work Consult for [place] Planning/Counseling   Complete Complete  Palliative Care Screening    Complete  Medication Review Press photographer)   Complete Complete

## 2023-01-07 NOTE — Progress Notes (Signed)
Chaplain met with Tasha Roberts and her son and his wife who were at bedside.  Tasha Roberts is ready to "go home" and feels very sure about focusing only on comfort at this time.  Her faith is very important to her and she feels comforted by that as she faces the end of her life.  She is not afraid, just sad that she will be saying goodbye to her family.  Chaplain provided listening as she engaged in life review and chaplain provided prayer at her request.  Tasha Roberts, [place] Pager, [phone]

## 2023-01-07 NOTE — Progress Notes (Signed)
SLP Cancellation Note  Patient Details Name: Tasha Roberts MRN: [id number] DOB: 18-Mar-1941   Cancelled treatment:       Reason Eval/Treat Not Completed: Other (comment) (slp order discontinued, please reorder if desire)   Macario Golds 01/07/2023, 11:35 AM  Kathleen Lime, MS [place] Office [phone]

## 2023-01-07 NOTE — Progress Notes (Signed)
Daily Progress Note   Patient Name: Tasha Roberts       Date: 01/07/2023 DOB: [birth year]  Age: 82 y.o. MRN#: [id number] Attending Physician: Julian Hy, DO Primary Care Physician: Lindell Spar, MD Admit Date: 01/03/2023 Length of Stay: 3 days  Reason for Consultation/Follow-up: Establishing goals of care  Subjective:   CC: Patient states she is still very tired today. Following up regarding complex medical decision making.   Subjective:  Reviewed EMR prior to presenting to bedside.  Also discussed care with bedside RN for medical updates.  Patient remains on 2 pressors for hypotension support.  ------------------------------------------------------------------------------------------------------------- Advance Care Planning Conversation  Pertinent diagnosis:   The patient and/or family consented to a voluntary Advance Care Planning Conversation. Individuals present for the conversation: Patient able to participate in complex medical decision making herself.  This palliative provider was present for entirety of conversation with patient.  Also able to call patient's daughter to update regarding conversation with patient.  Summary of the conversation:  When presenting to bedside, reintroduced myself to patient.  Patient will come to visit.  With patient's permission, able to discuss her underlying medical diseases and her current situation.  Discussed that patient is on artificial life support through medications to keep her blood pressure up.  Patient acknowledged this.  Tried to explore what patient was hoping for moving forward patient honestly said that she "prays every day God will take her as she does not want to go through this anymore".  Acknowledged this and offered emotional support as able.  Patient appropriately tearful during conversation.  Patient noted she just feels tired and wants to be able to sleep.  She knows she is very ill and has been sick for a long time so she is  excepting that she is at the end of her life.  Patient able to spend time reminiscing about wife and hopes for going to heaven so that she can see her husband, mother, brother, and other deceased family members who she knows are waiting for her. Discussed with patient the idea of transition to comfort focused care.  Explained discontinuing medications like artificial life support, IV fluids, lab work, imaging, and instead transitioning to focus on symptom management such as pain, dyspnea, and anxiety.  Patient voiced desire to transition to comfort focused care and that she has been in pain and she does not want to suffer at the end of her life.  Acknowledged this and that she would have medications available for this.  Patient voiced appreciation for this.  With patient's permission, noted would reach out to her daughter Neoma Laming to inform her of this discussion.  Patient voiced appreciation for doing this and for the support.  Noted would also asked chaplain to stop by which patient was grateful for.  All questions answered at that time.  Thanked patient for allowing me to visit today.  Able to call patient's daughter/HCPOA, Royetta Crochet, after visiting with patient.  Introduced myself as a member of the palliative medicine team.  Was able to update daughter about discussion had with her mother.  Daughter acknowledged that patient has been tired.  Though appropriately tearful, daughter acknowledges patient's wishes to focus on comfort at the end of her life and that patient no longer wants her life prolonged artificially.  Discussed transition to comfort focused care at this time.  Explained that would have to see how patient is going to do once vasopressors are discontinued as time could be very short or  could take a few days depending on how patient's body responds.  Daughter noted that there are multiple family members from out of town she wants to reach out to.  Acknowledged this and that we will  continue pressors at their current level, without escalation, to allow some time for family to come and visit.  Patient can still die on pressor support as will not escalate the current levels care.  Also explained that patient will be provided medications for comfort which may in turn make her sleepy so family should come in as soon as possible if they wish to visit.  Should patient passed away, she will be allowed to have a natural death as per her own wishes.  Daughter acknowledged this and that she would be coming up to see patient today.  Offered emotional support via active listening.  Thanked daughter for speaking with me today.  Outcome of the conversations and/or documents completed:  Decision to comfort focused care at this time.  Will continue current level of pressor support, without escalation, to allow family time to come to bedside to visit.  Once family has had time for visiting, pressors will be discontinued.  I spent 29 minutes providing separately identifiable ACP services with the patient and/or surrogate decision maker in a voluntary, in-person conversation discussing the patient's wishes and goals as detailed in the above note.  Chelsea Aus, DO Palliative Care Provider  -------------------------------------------------------------------------------------------------------------  Updated care team regarding transition to comfort focused care at this time.  Review of Systems Tired  Objective:   Vital Signs:  BP 121/68   Pulse 73   Temp (!) 97 F (36.1 C) (Axillary)   Resp 12   Ht '5\' 3"'$  (1.6 m)   Wt 68 kg   SpO2 100%   BMI 26.56 kg/m   Physical Exam: General: awake, laying in bed, chronically ill-appearing Eyes: dry mucous membranes Cardiovascular: RRR, anasarca present in all extremities with weeping from legs Respiratory: no increased work of breathing noted, not in respiratory distress Abdomen: Soft Extremities: muscle wasting present in extremities  Skin:  Extensive ecchymoses on upper extremities and lower extremities bilaterally Neuro: A&Ox4, following commands easily Psych: appropriately answers all questions  Imaging: I personally reviewed recent imaging.   Assessment & Plan:   Assessment:  Patient is a 82 year old female with a past medical history of chronic hypotension on midodrine, NASH cirrhosis, A-fib on Eliquis, stage IV CKD, iron deficiency anemia, hypothyroidism, dyslipidemia, osteoporosis, depression, type 2 diabetes, GERD, and Graves' disease who was admitted on 01/03/2023 for management of weakness and shortness of breath.  Since being admitted to the hospital patient has been admitted to the [facility] service.  Receiving support in ICU for hypotension, NASH cirrhosis with ascites, possible sepsis from urinary source, and management of chronic left pleural effusion.  Recommendations/Plan: # Complex medical decision making/goals of care:  - Discussed care extensively with patient in person and then daughter, Neoma Laming, over the phone after visit with patient as described above in HPI.  Patient able to voice that she does not want to go through any more invasive medical procedures.  Patient is excepting that she is at the end of her life and wants to focus on her comfort until she passes.  Patient is very spiritual and has faith that she will be going to heaven where she can see her deceased loved ones including her husband, mother, and brother.  Patient noted pain and anxiety so discussed that medications would be provided to help  alleviate this.  Daughter supporting of patient's decision to transition to comfort focused care.  At this time, patient will be maintained on current level of pressor support to allow family time to come and visit with patient.  Once family present, pressors will be discontinued.  Follow-up pressor support will not be increased and so patient could pass away despite being on pressors.  Daughter acknowledged  this.   -Patient does have ACP documentation on file naming her HCPOA as Royetta Crochet and primary alternate as Dollene Cleveland. This documentation would not take affect unless patient was unable to make her own medical decisions which is able to do at this time.   -At this time we will discontinue interventions that are no longer focused on comfort such as IV fluids, imaging, or lab work.  Will instead focus on symptom management of pain, dyspnea, and agitation in the setting of end-of-life care.  - Code Status: DNR   # Symptom management:    -Pain/Dyspnea, acute in the setting of end-of-life care                               -Started IV Dilaudid 0.2 mg IV every q30 mins as needed.  Continue to adjust based on patient's symptom burden.  If patient needing frequent dosing, may need to consider continuous infusion.                  -Anxiety/agitation, in the setting of end-of-life care                               -Started IV/po/SL Ativan '1mg'$  every 4 hours as needed. Continue to adjust based on patient's symptom burden.                                 -And also has IV/po/SL Haldol 0.5 mg every 4 hours as needed. Continue to adjust based on patient's symptom burden.                   -Secretions, in the setting of end-of-life care                               -Started IV/po/SL glycopyrrolate every 4 hours as needed.  # Psychosocial Support:  -Daughter  # Discharge Planning: Transitioning to comfort focused care at this time.  Continuing pressors at current level to allow family time to come to bedside to visit.  Once family able to visit, pressors will be discontinued.  At that time we will continue to follow to determine if patient will have in-hospital death versus consideration for hospice support.  Discussed with: bedside RN, patient, daughter, PCCM  Thank you for allowing the palliative care team to participate in the care Riley Nearing, DO Palliative Care Provider PMT  # [phone]

## 2023-01-07 NOTE — Evaluation (Addendum)
Occupational Therapy Evaluation Patient Details Name: Tasha Roberts MRN: [id number] DOB: 1941-11-12 Today's Date: 01/07/2023   History of Present Illness Patient is a 82 year old female with NASH with cirrhosis and ascites presented to the ER with weakness and dyspnea.  Found to have low blood pressure.  Concern for sepsis and she was started on Abx, and pressors. PMH: Chronic hypotension on midodrine, NASH with cirrhosis, A fib on eliquis, Breast cancer s/p mastectomy and XRT in 2012, CKD 4, IDA, Hypothyroidism, HTN, HLD, Osteoporosis, Bronchiectasis, Depression, DM type 2, GERD, Grave's disease, HLD, Sarcoidosis   Clinical Impression   Patient 's eval was completed this AM prior to palliative meeting with family. Patient participated at bed level with +2 A and sitting EOB as tolerated with briefly min guard sitting EOB. Patient was educated on keeping BLE and BUE moving as tolerated while in bed to maintain ability to engage in ADL tasks as desired. Patient verbalized understanding. Nurse present in room. OT d/c at this time per discontinuation of orders per pallative MD. Please re order OT if needed in future. OT singing off at this time.      Recommendations for follow up therapy are one component of a multi-disciplinary discharge planning process, led by the attending physician.  Recommendations may be updated based on patient status, additional functional criteria and insurance authorization.   Follow Up Recommendations  Skilled nursing-short term rehab (<3 hours/day)     Assistance Recommended at Discharge Frequent or constant Supervision/Assistance  Patient can return home with the following Two people to help with walking and/or transfers;Assistance with cooking/housework;Direct supervision/assist for medications management;Two people to help with bathing/dressing/bathroom;Assistance with feeding;Direct supervision/assist for financial management;Help with stairs or ramp for entrance;Assist  for transportation    Functional Status Assessment  Patient has had a recent decline in their functional status and/or demonstrates limited ability to make significant improvements in function in a reasonable and predictable amount of time  Equipment Recommendations  None recommended by OT    Recommendations for Other Services       Precautions / Restrictions Precautions Precautions: Fall Restrictions Weight Bearing Restrictions: No      Mobility Bed Mobility Overal bed mobility: Needs Assistance Bed Mobility: Supine to Sit, Sit to Supine     Supine to sit: Total assist, +2 for physical assistance Sit to supine: Total assist, +2 for physical assistance          Balance Overall balance assessment: Needs assistance Sitting-balance support: Feet unsupported, Bilateral upper extremity supported Sitting balance-Leahy Scale: Fair           ADL either performed or assessed with clinical judgement   ADL Overall ADL's : Needs assistance/impaired         General ADL Comments: patient was agreeable to participate in session. patient was +2 for sitting on EOB with nurse present in room. patient was min guard sitting EOB with +2 supervision. patient was able to move BLE sitting EOB with education on moving UE and BLE in bed to tolerance. patient verbalized understanding. patient was +2 to return to bed at this time.      Pertinent Vitals/Pain Pain Assessment Pain Assessment: Faces Faces Pain Scale: Hurts little more Pain Location: BLE with movement R over L Pain Descriptors / Indicators: Discomfort Pain Intervention(s): Limited activity within patient's tolerance, Monitored during session, Repositioned     Hand Dominance     Extremity/Trunk Assessment Upper Extremity Assessment Upper Extremity Assessment: Overall WFL for tasks assessed   Lower  Extremity Assessment Lower Extremity Assessment: Defer to PT evaluation (noted to be very edeamous during sesssion)    Cervical / Trunk Assessment Cervical / Trunk Assessment: Kyphotic   Communication Communication Communication: No difficulties   Cognition Arousal/Alertness: Awake/alert Behavior During Therapy: WFL for tasks assessed/performed Overall Cognitive Status: Within Functional Limits for tasks assessed         General Comments  noted to have increased swelling in BLE with notable pain wiht touch to RLE. patient noted to have wraps on bilateral forearms as well.            Home Living Family/patient expects to be discharged to:: Private residence Living Arrangements: Alone Available Help at Discharge: Family;Available PRN/intermittently Type of Home: Mobile home Home Access: Level entry     Home Layout: One level     Bathroom Shower/Tub: Occupational psychologist: Handicapped height     Home Equipment: Rollator (4 wheels)          Prior Functioning/Environment Prior Level of Function : Needs assist       Physical Assist : Mobility (physical);ADLs (physical) Mobility (physical): Bed mobility;Transfers;Gait;Stairs ADLs (physical): Bathing;Dressing;Toileting Mobility Comments: pt stated she hasn't walked in a couple months ADLs Comments: assisted by family        OT Problem List: Decreased strength;Decreased activity tolerance;Impaired balance (sitting and/or standing);Decreased coordination;Decreased range of motion;Decreased safety awareness;Decreased knowledge of precautions;Decreased knowledge of use of DME or AE;Cardiopulmonary status limiting activity;Impaired sensation;Pain;Impaired UE functional use;Increased edema      OT Treatment/Interventions:      OT Goals(Current goals can be found in the care plan section) Acute Rehab OT Goals Patient Stated Goal: none stated OT Goal Formulation: All assessment and education complete, DC therapy (after OT assessment MD cancelled orders per patient request)  OT Frequency:         AM-PAC OT "6 Clicks"  Daily Activity     Outcome Measure Help from another person eating meals?: A Lot Help from another person taking care of personal grooming?: A Lot Help from another person toileting, which includes using toliet, bedpan, or urinal?: Total Help from another person bathing (including washing, rinsing, drying)?: Total Help from another person to put on and taking off regular upper body clothing?: Total Help from another person to put on and taking off regular lower body clothing?: Total 6 Click Score: 8   End of Session Nurse Communication: Other (comment) (nurse present during session)  Activity Tolerance: Patient limited by fatigue Patient left: in bed;with call bell/phone within reach  OT Visit Diagnosis: Muscle weakness (generalized) (M62.81);Pain                Time: 1044-1100 OT Time Calculation (min): 16 min Charges:  OT General Charges $OT Visit: 1 Visit OT Evaluation $OT Eval Moderate Complexity: 1 Mod  Tasha Roberts OTR/L, MS Acute Rehabilitation Department Office# [phone]   Tasha Roberts 01/07/2023, 12:02 PM

## 2023-01-07 NOTE — Progress Notes (Signed)
NAME:  Tasha Roberts, MRN:  [id number], DOB:  1941-05-07, LOS: 3 ADMISSION DATE:  01/03/2023, CONSULTATION DATE:  01/04/2023 REFERRING MD:  Dr. [place] Crane, [facility], CHIEF COMPLAINT:  Hypotension   History of Present Illness:  82 yo female with NASH with cirrhosis and ascites presented to the ER with weakness and dyspnea.  Found to have low blood pressure.  Concern for sepsis and she was started on Abx, and pressors.    Pertinent  Medical History  Chronic hypotension on midodrine, MASH with cirrhosis, A fib on eliquis, Breast cancer s/p mastectomy and XRT in 2012, CKD 4, IDA, Hypothyroidism, HTN, HLD, Osteoporosis, Bronchiectasis, Depression, DM type 2, GERD, Grave's disease, HLD, Sarcoidosis  Significant Hospital Events: Including procedures, antibiotic start and stop dates in addition to other pertinent events   1/27 Admit, start levophed 1/28 transfer from [facility] ER to [facility] ICU; paracentesis & CVC  Studies:  CT angio chest 1/27 >> atherosclerosis, calcified mitral ring, 3 vessel coronary calcification, moderate Lt effusion w/o change from 09/2022 CT abd/pelvis 1/27 >> cirrhotic liver, calcified granulomas in Rt lobe of liver and spleen, absent GB, atrophic pancreas, 2 mm Lt upper pole kidney stone, small hiatal hernia, colonic diverticula, 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  Complains of feeling cold.  Objective   Blood pressure (!) 116/52, pulse 69, temperature 97.8 F (36.6 C), temperature source Oral, resp. rate 13, height '5\' 3"'$  (1.6 m), weight 68 kg, SpO2 98 %.        Intake/Output Summary (Last 24 hours) at 01/07/2023 2138 Last data filed at 01/07/2023 2024 Gross per 24 hour  Intake 1082.73 ml  Output 350 ml  Net 732.73 ml     Filed Weights   01/03/23 2257  Weight: 68 kg    Examination: General -frail and chronically ill-appearing woman lying in bed in no acute distress  HEENT: [place]/AT, eyes anicteric Cardiac -S1-S2, regular rate and rhythm Chest -breathing comfortably nasal cannula, clear to auscultation bilaterally.  No conversational dyspnea. Abdomen -soft, nontender Extremities -worsening lower extremity edema, weeping from legs Skin -bruising, no diffuse rashes Neuro -awake and alert, answering questions appropriately  Paracentesis culture: NGTD urine culture: multiple species Blood culture (only 1 site collected): NG x 4 days  Peritoneal fluid: WBC 46, 70% monos Albumin <1.5g Total protein <3g LDH not collected  Resolved Hospital Problem list   Lactic acidosis Hypokalemia  Assessment & Plan:   Hypotension- difficult to determine if there is an acute change or if this is related to progression of her chronic medical conditions. She reports an intolerance to midodrine, which will make her long-term treatment a challenge. No obvious source of infection if this is sepsis. Adrenal insufficiency is possible.  Concern for adrenal insufficiency  NASH with cirrhosis and ascites; MELD-Na+= 27 Coagulopathy due to liver disease, worsening Hyponatremia, hypervolemic anasarca  Possible sepsis from urinary source; mild pyuria on UA  AKI-- concern for HRS vs sepsis-associated AKI. Baseline CKD 4.  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  History of atrial fibrillation Hx of CAD, HLD  Rt lower leg skin tear- from trauma after hitting her leg on a wheelchair  IDA  DM type 2 with hyperglycemia, A1c 3.7  AKI on CKD 3b Non gap metabolic acidosis  Moderate protein calorie malnutrition  Remote hx of  sarcoidosis with bronchiectasis  Goals of care  Insomnia   -Overall prognosis poor.  This is best discussed her goals of palliative care and wishes to focus  on her comfort and not escalating aggressive care measures.  I agree that this is in her best interest as her chances of returning home to her baseline functional status are poor.  Comfort focused orders placed by palliative care.  Okay to leave vasopressors on if this is the patient's wishes.  Best Practice (right click and "Reselect all SmartList Selections" daily)   Diet/type: full liquids - per patient request DVT prophylaxis: prophylactic heparin  GI prophylaxis: PPI Lines: Central line, has port from PTA Foley:  N/A Code Status:  DNR Last date of multidisciplinary goals of care discussion '[x]'$   Labs       Latest Ref Rng & Units 01/06/2023    2:26 AM 01/05/2023    4:51 AM 01/04/2023    4:05 AM  CMP  Glucose 70 - 99 mg/dL 167  168  153   BUN 8 - 23 mg/dL '29  25  29   '$ Creatinine 0.44 - 1.00 mg/dL 1.69  1.74  1.73   Sodium 135 - 145 mmol/L 128  130  136   Potassium 3.5 - 5.1 mmol/L 4.0  2.9  3.4   Chloride 98 - 111 mmol/L 105  103  109   CO2 22 - 32 mmol/L '18  19  18   '$ Calcium 8.9 - 10.3 mg/dL 6.9  7.1  7.5   Total Protein 6.5 - 8.1 g/dL  3.7  4.0   Total Bilirubin 0.3 - 1.2 mg/dL  1.2  1.0   Alkaline Phos 38 - 126 U/L  59  65   AST 15 - 41 U/L  29  31   ALT 0 - 44 U/L  15  15        Latest Ref Rng & Units 01/06/2023    2:26 AM 01/05/2023    4:51 AM 01/04/2023    4:59 PM  CBC  WBC 4.0 - 10.5 K/uL 8.8  9.9    Hemoglobin 12.0 - 15.0 g/dL 7.8  8.6  6.8   Hematocrit 36.0 - 46.0 % 23.0  25.4  20.5   Platelets 150 - 400 K/uL 119  159      ABG No results found for: "PHART", "PCO2ART", "PO2ART", "HCO3", "TCO2", "ACIDBASEDEF", "O2SAT"  CBG (last 3)  Recent Labs    01/06/23 1655 01/06/23 2104 01/07/23 0806  GLUCAP 138* 153* 215*     Critical care time: 32 min.     Julian Hy, DO 01/07/23 9:38 PM [place] [facility]  For contact information, see Amion. If no response to pager, please call PCCM consult pager. After hours, 7PM- 7AM, please call  Elink.

## 2023-01-08 DIAGNOSIS — K7581 Nonalcoholic steatohepatitis (NASH): Secondary | ICD-10-CM | POA: Diagnosis not present

## 2023-01-08 DIAGNOSIS — K7290 Hepatic failure, unspecified without coma: Secondary | ICD-10-CM | POA: Diagnosis not present

## 2023-01-08 DIAGNOSIS — R64 Cachexia: Secondary | ICD-10-CM | POA: Diagnosis not present

## 2023-01-08 DIAGNOSIS — K7460 Unspecified cirrhosis of liver: Secondary | ICD-10-CM

## 2023-01-08 DIAGNOSIS — I959 Hypotension, unspecified: Secondary | ICD-10-CM | POA: Diagnosis not present

## 2023-01-08 DIAGNOSIS — Z515 Encounter for palliative care: Secondary | ICD-10-CM | POA: Diagnosis not present

## 2023-01-08 DIAGNOSIS — Z7189 Other specified counseling: Secondary | ICD-10-CM

## 2023-01-08 LAB — CULTURE, BLOOD (ROUTINE X 2): Culture: NO GROWTH

## 2023-01-08 LAB — BODY FLUID CULTURE W GRAM STAIN
Culture: NO GROWTH
Gram Stain: NONE SEEN

## 2023-01-08 MED ORDER — OXYCODONE HCL 5 MG PO TABS: 2.5000 mg | ORAL_TABLET | ORAL | Status: DC | PRN

## 2023-01-08 NOTE — Progress Notes (Signed)
[facility] [address] [facility] Liaison Note   Received request from [facility], Darletta Moll, for hospice services at home after discharge.    Spoke with patient's daughter, [name] to initiate education related to hospice philosophy, services, and team approach to care. Debbie verbalized understanding of information given. Per discussion, the plan is for patient to discharge home once cleared to DC.    DME needs discussed. Patient has the following equipment in the home (Purchased privately): wheelchair, rolling walker, bedside commode, and hospital bed. Patient requests the following equipment for delivery: None for now.  Address verified and is correct in the chart.    Please send signed and completed DNR home with patient/family. Please provide prescriptions at discharge as needed to ensure ongoing symptom management.    [facility] information and contact numbers given to family & above information shared with TOC.   Please call with any questions/concerns.    Thank you for the opportunity to participate in this patient's care.   Zigmund Gottron  [facility] Liaison  [phone]

## 2023-01-08 NOTE — Progress Notes (Signed)
NAME:  Marlei Glomski, MRN:  [id number], DOB:  1941/11/20, LOS: 4 ADMISSION DATE:  01/03/2023, CONSULTATION DATE:  01/04/2023 REFERRING MD:  Dr. Nevada Crane, [facility], CHIEF COMPLAINT:  Hypotension   History of Present Illness:  82 yo female with NASH with cirrhosis and ascites presented to the ER with weakness and dyspnea.  Found to have low blood pressure.  Concern for sepsis and she was started on Abx, and pressors.    Pertinent  Medical History  Chronic hypotension on midodrine, MASH with cirrhosis, A fib on eliquis, Breast cancer s/p mastectomy and XRT in 2012, CKD 4, IDA, Hypothyroidism, HTN, HLD, Osteoporosis, Bronchiectasis, Depression, DM type 2, GERD, Grave's disease, HLD, Sarcoidosis  Significant Hospital Events: Including procedures, antibiotic start and stop dates in addition to other pertinent events   1/27 Admit, start levophed 1/28 transfer from [facility] ER to [facility] ICU; paracentesis & CVC  Studies:  CT angio chest 1/27 >> atherosclerosis, calcified mitral ring, 3 vessel coronary calcification, moderate Lt effusion w/o change from 09/2022 CT abd/pelvis 1/27 >> cirrhotic liver, calcified granulomas in Rt lobe of liver and spleen, absent GB, atrophic pancreas, 2 mm Lt upper pole kidney stone, small hiatal hernia, colonic diverticula, 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 Denied complaints to her daughter when she was awake about an hour ago.  Objective   Blood pressure (!) 98/49, pulse 61, temperature 97.8 F (36.6 C), temperature source Oral, resp. rate (!) 9, height '5\' 3"'$  (1.6 m), weight 68 kg, SpO2 99 %.        Intake/Output Summary (Last 24 hours) at 01/08/2023 0754 Last data filed at 01/08/2023 7829 Gross per 24 hour  Intake 691.46 ml  Output  250 ml  Net 441.46 ml    Filed Weights   01/03/23 2257  Weight: 68 kg    Examination: General - chronically ill appearing woman lying in bed sleeping comfortably  HEENT: temporal wasting Cardiac - skin well perfused Chest - breathing comfortably on RA Abdomen -nondistended Extremities - ++edema Skin - pallor Neuro -sleeping  Resolved Hospital Problem list   Lactic acidosis Hypokalemia  Assessment & Plan:   Hypotension- concern this is related to progression of her chronic medical conditions. She reports an intolerance to midodrine, which will make her long-term treatment a challenge. No obvious source of infection if this is sepsis. Adrenal insufficiency is possible.  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 Baseline CKD 4.  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  History of atrial fibrillation Hx of CAD, HLD  Rt lower leg skin tear- from trauma after hitting her leg on a wheelchair  IDA  DM type 2 with hyperglycemia, A1c 3.7  AKI on CKD 3b Non gap metabolic acidosis  Moderate protein calorie malnutrition  Remote hx of sarcoidosis with bronchiectasis  Goals of care  Insomnia   -Agree with comfort care. Stable to  transfer to a med surg unit today. May be reasonable to consider home with hospice vs inpatient hospice unit. Family updated at bedside. Can escalate her comfort treatments if she is uncomfortable, but no needs identified this morning.   Best Practice (right  click and "Reselect all SmartList Selections" daily)   Diet/type: full liquids  DVT prophylaxis: not indicated GI prophylaxis: PPI Lines: Central line, has port from PTA Foley:  N/A Code Status:  DNR Last date of multidisciplinary goals of  care discussion '[x]'$   Labs       Latest Ref Rng & Units 01/06/2023    2:26 AM 01/05/2023    4:51 AM 01/04/2023    4:05 AM  CMP  Glucose 70 - 99 mg/dL 167  168  153   BUN 8 - 23 mg/dL '29  25  29   '$ Creatinine 0.44 - 1.00 mg/dL 1.69  1.74  1.73   Sodium 135 - 145 mmol/L 128  130  136   Potassium 3.5 - 5.1 mmol/L 4.0  2.9  3.4   Chloride 98 - 111 mmol/L 105  103  109   CO2 22 - 32 mmol/L '18  19  18   '$ Calcium 8.9 - 10.3 mg/dL 6.9  7.1  7.5   Total Protein 6.5 - 8.1 g/dL  3.7  4.0   Total Bilirubin 0.3 - 1.2 mg/dL  1.2  1.0   Alkaline Phos 38 - 126 U/L  59  65   AST 15 - 41 U/L  29  31   ALT 0 - 44 U/L  15  15        Latest Ref Rng & Units 01/06/2023    2:26 AM 01/05/2023    4:51 AM 01/04/2023    4:59 PM  CBC  WBC 4.0 - 10.5 K/uL 8.8  9.9    Hemoglobin 12.0 - 15.0 g/dL 7.8  8.6  6.8   Hematocrit 36.0 - 46.0 % 23.0  25.4  20.5   Platelets 150 - 400 K/uL 119  159      ABG No results found for: "PHART", "PCO2ART", "PO2ART", "HCO3", "TCO2", "ACIDBASEDEF", "O2SAT"  CBG (last 3)  Recent Labs    01/06/23 1655 01/06/23 2104 01/07/23 0806  GLUCAP 138* 153* 215*     Critical care time:      Julian Hy, DO 01/08/23 7:59 AM [place] [facility]  For contact information, see Amion. If no response to pager, please call PCCM consult pager. After hours, 7PM- 7AM, please call Elink.

## 2023-01-08 NOTE — Progress Notes (Signed)
Nutrition Brief Note  Chart reviewed. Pt now transitioning to comfort care.  No further nutrition interventions planned at this time.  Please re-consult as needed.     Dillyn Joaquin RD, LDN For contact information, refer to AMiON.   

## 2023-01-08 NOTE — Progress Notes (Signed)
Daily Progress Note   Patient Name: Tasha Roberts       Date: 01/08/2023 DOB: 11-Nov-1941  Age: 82 y.o. MRN#: [id number] Attending Physician: Julian Hy, DO Primary Care Physician: Lindell Spar, MD Admit Date: 01/03/2023 Length of Stay: 4 days  Reason for Consultation/Follow-up: Establishing goals of care  Subjective:   CC: Patient was transitioned to comfort focused care yesterday.  Denies any symptom concerns at this time.  Following up regarding end of life management.    Subjective:  Reviewed EMR prior to presenting to bedside.  Vasopressor support has been discontinued.  Patient has been transferred out of ICU to continue comfort focused care.  Discussed care with bedside RN prior to seeing patient.  When presenting to bedside, multiple family members present including daughter visiting with patient.  Patient seen laying comfortably in bed.  Patient alert and interactive.  Patient noted she feels much better since all her family has come to visit.  Noted this is the best patient has looked since hospitalization.  Patient and family still wanting to focus on her comfort at the end of life.  More family planning to come visit.  Patient denies any symptoms of concern.  Discussed ways to support comfort focused care moving forward.  Discussed the idea of hospice referral and potentially returning home with hospice.  Family at bedside noted coming to support 24/7 care.  Patient agreeing with this idea.  Noted would reach out to [facility] so could get more information about home with hospice support.  Noted would continue appropriate comfort focused care and symptom management while here at the hospital.  All questions answered at that time.  Thanked family for being present with patient.  Thanked patient for allowing me to visit with her today.  Review of Systems Denies any symptoms of concern  Objective:   Vital Signs:  BP (!) 98/49   Pulse 61   Temp 97.8 F (36.6 C) (Oral)   Resp (!)  9   Ht '5\' 3"'$  (1.6 m)   Wt 68 kg   SpO2 99%   BMI 26.56 kg/m   Physical Exam: General: awake and interactive, pleasant, laying in bed, chronically ill-appearing Eyes: Moist mucous membranes Cardiovascular: RRR, anasarca present in all extremities with weeping from legs Respiratory: no increased work of breathing noted, not in respiratory distress Abdomen: Soft Extremities: muscle wasting present in extremities  Skin: Extensive ecchymoses on upper extremities and lower extremities bilaterally Neuro: A&Ox4, following commands easily Psych: appropriately answers all questions, happy family present visiting  Imaging: I personally reviewed recent imaging.   Assessment & Plan:   Assessment:  Patient is a 82 year old female with a past medical history of chronic hypotension on midodrine, NASH cirrhosis, A-fib on Eliquis, stage IV CKD, iron deficiency anemia, hypothyroidism, dyslipidemia, osteoporosis, depression, type 2 diabetes, GERD, and Graves' disease who was admitted on 01/03/2023 for management of weakness and shortness of breath.  Since being admitted to the hospital patient has been admitted to the [facility] service.  Receiving support in ICU for hypotension, NASH cirrhosis with ascites, possible sepsis from urinary source, and management of chronic left pleural effusion.  Recommendations/Plan: # Complex medical decision making/goals of care:  - Patient was transition to full comfort care on 01/07/2023.  Based on current evaluation, will work to get patient home with hospice support.  TOC referral has been placed to assist with management.  Patient and family agreeing with this plan.   -Patient does have ACP documentation on file  naming her HCPOA as Royetta Crochet and primary alternate as Dollene Cleveland. This documentation would not take affect unless patient was unable to make her own medical decisions which is able to do at this time.   - Code Status: DNR   # Symptom management:     -Pain/Dyspnea, acute in the setting of end-of-life care                               -Adjusting medications for pain as patient currently awake and alert.  Potentially home with hospice.  Will start oxycodone 2.5 mg every 4 hours as needed.  Patient has IV Dilaudid for breakthrough management.                 -Anxiety/agitation, in the setting of end-of-life care                               -Has po/SL Ativan '1mg'$  every 4 hours as needed. Continue to adjust based on patient's symptom burden.                                 -And also has IV/po/SL Haldol 0.5 mg every 4 hours as needed. Continue to adjust based on patient's symptom burden.                   -Secretions, in the setting of end-of-life care                               -As IV/po glycopyrrolate every 4 hours as needed.  # Psychosocial Support:  -Multiple family members, including daughter, present at bedside visiting today.  # Discharge Planning: Continue with comfort focused care at this time.  TOC referral placed to assist with possible home with hospice support.  Discussed with: bedside RN, patient, family, care team  Thank you for allowing the palliative care team to participate in the care Riley Nearing, DO Palliative Care Provider PMT # [phone]  This provider spent a total of 51 minutes providing patient's care.  Includes review of EMR, discussing care with other staff members involved in patient's medical care, obtaining relevant history and information from patient and/or patient's family, and personal review of imaging and lab work. Greater than 50% of the time was spent counseling and coordinating care related to the above assessment and plan.

## 2023-01-08 NOTE — TOC Progression Note (Signed)
Transition of Care [facility]) - Progression Note    Patient Details  Name: Tasha Roberts MRN: [id number] Date of Birth: 02-12-1941  [facility]) CM/SW [place], LCSW Phone Number: 01/08/2023, 2:34 PM  Clinical Narrative:    Met with pt's family at bedside and confirmed current plan for pt to return home with hospice services. Family share that pt has wheelchair, rolling walker, bedside commode, and hospital bed. They share that pt does complain of pain with hospital bed and are interested in pressure mattress for the bed. Family lives in [place] and they do not have a preference for hospice agency. A referral has been made to [facility] for in home hospice. TOC will continue to follow for discharge plans and needs.    Expected Discharge Plan: Home w Hospice Care Barriers to Discharge: Barriers Resolved  Expected Discharge Plan and Services In-house Referral: Hospice / [place], Clinical Social Work Discharge Planning Services: CM Consult Post Acute Care Choice: Hospice Living arrangements for the past 2 months: Single Family Home                 DME Arranged: N/A DME Agency: NA                   Social Determinants of Health (SDOH) Interventions SDOH Screenings   Food Insecurity: No Food Insecurity (10/23/2022)  Housing: Low Risk  (10/23/2022)  Transportation Needs: No Transportation Needs (10/23/2022)  Utilities: Not At Risk (10/23/2022)  Alcohol Screen: Low Risk  (08/18/2022)  Depression (PHQ2-9): Low Risk  (09/01/2022)  Financial Resource Strain: Low Risk  (08/18/2022)  Physical Activity: Insufficiently Active (08/18/2022)  Social Connections: Moderately Isolated (08/18/2022)  Stress: No Stress Concern Present (08/18/2022)  Tobacco Use: Low Risk  (01/06/2023)    Readmission Risk Interventions    01/08/2023    2:32 PM 10/24/2022   10:48 AM 07/25/2022    3:44 PM  Readmission Risk Prevention Plan  Transportation Screening  Complete Complete Complete  HRI or Home Care Consult  Complete Complete  Social Work Consult for [place] Planning/Counseling  Complete Complete  Palliative Care Screening   Complete  Medication Review Press photographer) Complete Complete Complete  PCP or Specialist appointment within 3-5 days of discharge Complete    HRI or [place] Complete    SW Recovery Care/Counseling Consult Complete    Palliative Care Screening Complete    [place] Not Applicable

## 2023-01-08 NOTE — Plan of Care (Signed)
  Problem: Education: Goal: Knowledge of General Education information will improve Description: Including pain rating scale, medication(s)/side effects and non-pharmacologic comfort measures Outcome: Progressing   Problem: Health Behavior/Discharge Planning: Goal: Ability to manage health-related needs will improve Outcome: Progressing   Problem: Clinical Measurements: Goal: Ability to maintain clinical measurements within normal limits will improve Outcome: Progressing Goal: Will remain free from infection Outcome: Progressing Goal: Diagnostic test results will improve Outcome: Progressing Goal: Respiratory complications will improve Outcome: Progressing Goal: Cardiovascular complication will be avoided Outcome: Progressing   Problem: Activity: Goal: Risk for activity intolerance will decrease Outcome: Progressing   Problem: Nutrition: Goal: Adequate nutrition will be maintained Outcome: Progressing   Problem: Coping: Goal: Level of anxiety will decrease Outcome: Progressing   Problem: Elimination: Goal: Will not experience complications related to bowel motility Outcome: Progressing Goal: Will not experience complications related to urinary retention Outcome: Progressing   Problem: Pain Managment: Goal: General experience of comfort will improve Outcome: Progressing   Problem: Safety: Goal: Ability to remain free from injury will improve Outcome: Progressing   Problem: Skin Integrity: Goal: Risk for impaired skin integrity will decrease Outcome: Progressing   Problem: Education: Goal: Ability to describe self-care measures that may prevent or decrease complications (Diabetes Survival Skills Education) will improve Outcome: Progressing Goal: Individualized Educational Video(s) Outcome: Progressing   Problem: Coping: Goal: Ability to adjust to condition or change in health will improve Outcome: Progressing   Problem: Fluid Volume: Goal: Ability to  maintain a balanced intake and output will improve Outcome: Progressing   Problem: Health Behavior/Discharge Planning: Goal: Ability to identify and utilize available resources and services will improve Outcome: Progressing Goal: Ability to manage health-related needs will improve Outcome: Progressing   Problem: Metabolic: Goal: Ability to maintain appropriate glucose levels will improve Outcome: Progressing   Problem: Nutritional: Goal: Maintenance of adequate nutrition will improve Outcome: Progressing Goal: Progress toward achieving an optimal weight will improve Outcome: Progressing   Problem: Skin Integrity: Goal: Risk for impaired skin integrity will decrease Outcome: Progressing   Problem: Tissue Perfusion: Goal: Adequacy of tissue perfusion will improve Outcome: Progressing   Problem: Education: Goal: Knowledge of the prescribed therapeutic regimen will improve Outcome: Progressing   Problem: Coping: Goal: Ability to identify and develop effective coping behavior will improve Outcome: Progressing   Problem: Clinical Measurements: Goal: Quality of life will improve Outcome: Progressing   Problem: Respiratory: Goal: Verbalizations of increased ease of respirations will increase Outcome: Progressing   Problem: Role Relationship: Goal: Family's ability to cope with current situation will improve Outcome: Progressing Goal: Ability to verbalize concerns, feelings, and thoughts to partner or family member will improve Outcome: Progressing   Problem: Pain Management: Goal: Satisfaction with pain management regimen will improve Outcome: Progressing

## 2023-01-09 ENCOUNTER — Inpatient Hospital Stay: Payer: Medicare Other

## 2023-01-09 ENCOUNTER — Telehealth: Payer: Self-pay | Admitting: Internal Medicine

## 2023-01-09 ENCOUNTER — Telehealth: Payer: Medicare Other | Admitting: Internal Medicine

## 2023-01-09 DIAGNOSIS — I2510 Atherosclerotic heart disease of native coronary artery without angina pectoris: Secondary | ICD-10-CM

## 2023-01-09 DIAGNOSIS — K7460 Unspecified cirrhosis of liver: Secondary | ICD-10-CM | POA: Diagnosis not present

## 2023-01-09 DIAGNOSIS — I480 Paroxysmal atrial fibrillation: Secondary | ICD-10-CM

## 2023-01-09 DIAGNOSIS — E440 Moderate protein-calorie malnutrition: Secondary | ICD-10-CM

## 2023-01-09 DIAGNOSIS — L8990 Pressure ulcer of unspecified site, unspecified stage: Secondary | ICD-10-CM

## 2023-01-09 DIAGNOSIS — D509 Iron deficiency anemia, unspecified: Secondary | ICD-10-CM

## 2023-01-09 DIAGNOSIS — N179 Acute kidney failure, unspecified: Secondary | ICD-10-CM

## 2023-01-09 DIAGNOSIS — I959 Hypotension, unspecified: Secondary | ICD-10-CM | POA: Diagnosis not present

## 2023-01-09 DIAGNOSIS — E039 Hypothyroidism, unspecified: Secondary | ICD-10-CM

## 2023-01-09 DIAGNOSIS — K7581 Nonalcoholic steatohepatitis (NASH): Secondary | ICD-10-CM | POA: Diagnosis not present

## 2023-01-09 DIAGNOSIS — Z515 Encounter for palliative care: Secondary | ICD-10-CM | POA: Diagnosis not present

## 2023-01-09 DIAGNOSIS — K7290 Hepatic failure, unspecified without coma: Secondary | ICD-10-CM | POA: Diagnosis not present

## 2023-01-09 DIAGNOSIS — J90 Pleural effusion, not elsewhere classified: Secondary | ICD-10-CM

## 2023-01-09 MED ORDER — ONDANSETRON 4 MG PO TBDP: 4.0000 mg | ORAL_TABLET | ORAL | 0 refills | Status: AC | PRN

## 2023-01-09 MED ORDER — LEVOTHYROXINE SODIUM 150 MCG PO TABS: 150.0000 ug | ORAL_TABLET | Freq: Every day | ORAL | 1 refills | Status: AC

## 2023-01-09 MED ORDER — LEVOTHYROXINE SODIUM 150 MCG PO TABS: 150.0000 ug | ORAL_TABLET | Freq: Every day | ORAL | 1 refills | Status: DC

## 2023-01-09 MED ORDER — LORAZEPAM 2 MG/ML PO CONC: 1.0000 mg | ORAL | 0 refills | Status: DC | PRN

## 2023-01-09 MED ORDER — HALOPERIDOL LACTATE 2 MG/ML PO CONC: 0.5000 mg | ORAL | 0 refills | Status: AC | PRN

## 2023-01-09 MED ORDER — OXYCODONE HCL 5 MG/5ML PO SOLN
2.5000 mg | ORAL | Status: DC | PRN
  Administered 2023-01-09: 2.5 mg via ORAL
  Filled 2023-01-09: qty 5

## 2023-01-09 MED ORDER — HEPARIN SOD (PORK) LOCK FLUSH 100 UNIT/ML IV SOLN
500.0000 [IU] | INTRAVENOUS | Status: AC | PRN
  Administered 2023-01-09: 500 [IU]

## 2023-01-09 MED ORDER — OXYCODONE HCL 5 MG/5ML PO SOLN: 2.5000 mg | ORAL | 0 refills | Status: AC | PRN

## 2023-01-09 MED ORDER — GLYCOPYRROLATE 1 MG PO TABS: 1.0000 mg | ORAL_TABLET | ORAL | 0 refills | Status: AC | PRN

## 2023-01-09 MED ORDER — LORAZEPAM 2 MG/ML PO CONC: 1.0000 mg | ORAL | 0 refills | Status: AC | PRN

## 2023-01-09 MED ORDER — HALOPERIDOL LACTATE 2 MG/ML PO CONC: 0.5000 mg | ORAL | 0 refills | Status: DC | PRN

## 2023-01-09 MED ORDER — OXYCODONE HCL 5 MG/5ML PO SOLN: 2.5000 mg | ORAL | 0 refills | Status: DC | PRN

## 2023-01-09 MED ORDER — GLYCOPYRROLATE 1 MG PO TABS: 1.0000 mg | ORAL_TABLET | ORAL | 0 refills | Status: DC | PRN

## 2023-01-09 NOTE — Telephone Encounter (Signed)
Alyse Low called from  [facility], patient is being discharged from hospital needs to speak nurse call back #[phone].

## 2023-01-09 NOTE — Plan of Care (Signed)
  Problem: Education: Goal: Knowledge of General Education information will improve Description: Including pain rating scale, medication(s)/side effects and non-pharmacologic comfort measures Outcome: Progressing   Problem: Health Behavior/Discharge Planning: Goal: Ability to manage health-related needs will improve Outcome: Progressing   Problem: Clinical Measurements: Goal: Ability to maintain clinical measurements within normal limits will improve Outcome: Progressing Goal: Will remain free from infection Outcome: Progressing Goal: Diagnostic test results will improve Outcome: Progressing Goal: Respiratory complications will improve Outcome: Progressing Goal: Cardiovascular complication will be avoided Outcome: Progressing   Problem: Activity: Goal: Risk for activity intolerance will decrease Outcome: Progressing   Problem: Nutrition: Goal: Adequate nutrition will be maintained Outcome: Progressing   Problem: Coping: Goal: Level of anxiety will decrease Outcome: Progressing   Problem: Elimination: Goal: Will not experience complications related to bowel motility Outcome: Progressing Goal: Will not experience complications related to urinary retention Outcome: Progressing   Problem: Pain Managment: Goal: General experience of comfort will improve Outcome: Progressing   Problem: Safety: Goal: Ability to remain free from injury will improve Outcome: Progressing   Problem: Skin Integrity: Goal: Risk for impaired skin integrity will decrease Outcome: Progressing   Problem: Education: Goal: Ability to describe self-care measures that may prevent or decrease complications (Diabetes Survival Skills Education) will improve Outcome: Progressing Goal: Individualized Educational Video(s) Outcome: Progressing   Problem: Coping: Goal: Ability to adjust to condition or change in health will improve Outcome: Progressing   Problem: Fluid Volume: Goal: Ability to  maintain a balanced intake and output will improve Outcome: Progressing   Problem: Health Behavior/Discharge Planning: Goal: Ability to identify and utilize available resources and services will improve Outcome: Progressing Goal: Ability to manage health-related needs will improve Outcome: Progressing   Problem: Metabolic: Goal: Ability to maintain appropriate glucose levels will improve Outcome: Progressing   Problem: Nutritional: Goal: Maintenance of adequate nutrition will improve Outcome: Progressing Goal: Progress toward achieving an optimal weight will improve Outcome: Progressing   Problem: Skin Integrity: Goal: Risk for impaired skin integrity will decrease Outcome: Progressing   Problem: Tissue Perfusion: Goal: Adequacy of tissue perfusion will improve Outcome: Progressing   Problem: Education: Goal: Knowledge of the prescribed therapeutic regimen will improve Outcome: Progressing   Problem: Coping: Goal: Ability to identify and develop effective coping behavior will improve Outcome: Progressing   Problem: Clinical Measurements: Goal: Quality of life will improve Outcome: Progressing   Problem: Respiratory: Goal: Verbalizations of increased ease of respirations will increase Outcome: Progressing   Problem: Role Relationship: Goal: Family's ability to cope with current situation will improve Outcome: Progressing Goal: Ability to verbalize concerns, feelings, and thoughts to partner or family member will improve Outcome: Progressing   Problem: Pain Management: Goal: Satisfaction with pain management regimen will improve Outcome: Progressing   

## 2023-01-09 NOTE — TOC Transition Note (Signed)
Transition of Care [facility]) - CM/SW Discharge Note   Patient Details  Name: Tasha Roberts MRN: [id number] Date of Birth: Apr 26, 1941  [facility] CM/SW Contact:  Vassie Moselle, LCSW Phone Number: 01/09/2023, 1:20 PM   Clinical Narrative:    Pt to transfer home under hospice care through [facility]. [facility] transport called at 1:10pm. No ETA was able to be given. Pt's family notified of transport being called.    Final next level of care: Home w Hospice Care Barriers to Discharge: Barriers Resolved   Patient Goals and CMS Choice CMS Medicare.gov Compare Post Acute Care list provided to:: Patient Choice offered to / list presented to : Adult Children  Discharge Placement                  Patient to be transferred to facility by: [facility] Name of family member notified: Duaine Dredge Patient and family notified of of transfer: 01/09/23  Discharge Plan and Services Additional resources added to the After Visit Summary for   In-house Referral: Hospice / [place], Clinical Social Work Discharge Planning Services: CM Consult Post Acute Care Choice: Hospice          DME Arranged: N/A DME Agency: NA                  Social Determinants of Health (SDOH) Interventions SDOH Screenings   Food Insecurity: No Food Insecurity (10/23/2022)  Housing: Low Risk  (10/23/2022)  Transportation Needs: No Transportation Needs (10/23/2022)  Utilities: Not At Risk (10/23/2022)  Alcohol Screen: Low Risk  (08/18/2022)  Depression (PHQ2-9): Low Risk  (09/01/2022)  Financial Resource Strain: Low Risk  (08/18/2022)  Physical Activity: Insufficiently Active (08/18/2022)  Social Connections: Moderately Isolated (08/18/2022)  Stress: No Stress Concern Present (08/18/2022)  Tobacco Use: Low Risk  (01/06/2023)     Readmission Risk Interventions    01/08/2023    2:32 PM 10/24/2022   10:48 AM 07/25/2022    3:44 PM  Readmission Risk Prevention Plan  Transportation Screening  Complete Complete Complete  HRI or Home Care Consult  Complete Complete  Social Work Consult for [place] Planning/Counseling  Complete Complete  Palliative Care Screening   Complete  Medication Review Press photographer) Complete Complete Complete  PCP or Specialist appointment within 3-5 days of discharge Complete    HRI or [place] Complete    SW Recovery Care/Counseling Consult Complete    Palliative Care Screening Complete    [place] Not Applicable

## 2023-01-09 NOTE — Discharge Summary (Signed)
Physician Discharge Summary  Tasha Roberts VEH:[id number] DOB: August 03, 1941 DOA: 01/03/2023  PCP: Lindell Spar, MD  Admit date: 01/03/2023 Discharge date: 01/09/2023  Time spent: 55 minutes  Recommendations for Outpatient Follow-up:  Follow-up with Lindell Spar, MD/hospice MD in 1 week.  Patient being discharged home with hospice.   Discharge Diagnoses:  Principal Problem:   Decompensated hepatic cirrhosis (HCC) Active Problems:   Cirrhosis of liver with ascites (HCC)   Septic shock (HCC)   Other ascites   Pressure injury of skin   Palliative care encounter   Hypervolemia   Need for emotional support   Goals of care, counseling/discussion   DNR (do not resuscitate)   High risk medication use   Pain   Agitation   End of life care   Shock ([place])   Counseling and coordination of care   Liver cirrhosis secondary to NASH ([place])   Chronic pleural effusion   Protein-calorie malnutrition, moderate (HCC)   Iron deficiency anemia   Paroxysmal atrial fibrillation (HCC)   Hypothyroidism   AKI (acute kidney injury) ([place])   Discharge Condition: Stable  Diet recommendation: Regular  Filed Weights   01/03/23 2257  Weight: 68 kg    History of present illness:  HPI per Dr. Doroteo Glassman is a 82 y.o. female with medical history significant for Nash cirrhosis, acquired thrombophilia, paroxysmal A-fib on Eliquis, iron deficiency anemia due to chronic blood loss, hemorrhoids with bleeding, Graves' disease, hyperlipidemia, CKD 3B, chronic hypotension on midodrine, who presented to [facility] ED from home via EMS due to progressive dyspnea with minimal exertion for the past 3 weeks.  Associated with generalized weakness and swelling.  States is getting harder to breathe with her abdominal distention.  No subjective reported fevers or chills.  EMS was activated.  She was brought into the ED for further evaluation.   In the ED, the patient was noted to be hypotensive with systolic blood pressure  less than 90 and MAP in the 50s.  Hemoglobin 8.6 from 9.2, 5 days ago, admits to occasional blood on toilet paper from hemorrhoids after wiping.  UA positive for pyuria.  Cultures were obtained and she was started on cefepime.  On physical exam severely volume overloaded.  CTA chest negative for pulmonary embolism and positive for moderate left pleural effusion.  CT abdomen and pelvis with increased generalized anasarca.  The patient was admitted by [facility], hospitalist service.   ED Course: Tmax 98.1.  BP 83/34, pulse 63, respiratory rate 18, saturation 100% on room air.  Lab studies remarkable for serum bicarb 19, BUN 27, creatinine 1.68 from baseline of 1.26.  Ammonia 46.  GFR 31.  BNP 259.  Hemoglobin 8.6, MCV 100.8   Hospital Course:  #1 hypotension/concern for adrenal insufficiency -Patient admitted with hypotension, initial concern was for an infectious etiology versus sepsis versus secondary to progression of her chronic medical conditions.  There was also concern for adrenal insufficiency. -Patient required pressors on presentation as patient noted to have a persistent hypotension. -Patient noted to have a history of chronic hypotension supposedly was to be on midodrine however due to intolerance unable to take the midodrine. -Patient seen by PCCM, patient pancultured with no growth to date. -Patient placed empirically on IV antibiotics during the hospitalization. -Patient also placed on her oral hydrocortisone due to concerns for adrenal insufficiency. -Patient also underwent paracentesis per PCCM. -Patient seen by palliative care. -It was felt that due to patient's intolerance to midodrine will make a long-term treatment  challenge. -Palliative care has patient decision made to transition to comfort measures and patient will be discharged home with hospice.  2.  NASH with cirrhosis and ascites/MELD score equal 27/coagulopathy due to liver disease worsening/hyponatremia/anasarca -Patient  underwent paracentesis with 1200 cc of fluid removed. -Patient initially placed empirically on IV antibiotics, required pressors which was subsequently weaned off. -Patient seen by palliative care, decision made to transition to full comfort measures. -Patient will be discharged home with hospice following.  3.  AKI on CKD stage IV/anion gap metabolic acidosis -Concern for HRS versus sepsis associated AKI. -Nephrotoxic medications were avoided. -Patient seen by palliative care and decision made to transition to comfort measures. -Patient will be discharged home with hospice following.  4.  Hypothyroidism -Continue sh obtain noted to be elevated at 94.081. -Patient's home dose Synthroid was adjusted and increased. -Patient seen by palliative care decision to transition to full comfort measures. -Patient will be discharged home with hospice.  5.  Chronic left pleural effusion-present since October 2023 -Patient seen by PCCM, recommendations were to try to avoid significant procedures until BP stabilized 1 coagulopathy improved. -Patient required pressors during the hospitalization. -Patient seen by palliative care decision to transition to full comfort measures. -Patient will be discharged home with hospice.  6.  History of A-fib/history of CAD/hyperlipidemia -Anticoagulation was held off patient's Eliquis during the hospitalization due to oozing from leg wounds, patient also noted to have a coagulopathy and patient subsequently was placed on DVT prophylaxis. -Cardizem was held and discontinued due to soft/low blood pressure. -Patient's rate remained controlled. -Patient seen by palliative care and decision made to transition to full comfort measures. -Patient will be discharged home with hospice.  7.  Iron deficiency anemia -Remained stable.  8.  Right lower leg skin tear -Secondary to trauma from hitting her leg on the wheelchair.  9.  Moderate protein calorie  malnutrition -Patient seen by palliative care decision was made to transition to full comfort measures.  Procedures: CT angiogram chest 01/03/2023 CT abdomen and pelvis 01/03/2023 CT angio chest 1/27 >> atherosclerosis, calcified mitral ring, 3 vessel coronary calcification, moderate Lt effusion w/o change from 09/2022 CT abd/pelvis 1/27 >> cirrhotic liver, calcified granulomas in Rt lobe of liver and spleen, absent GB, atrophic pancreas, 2 mm Lt upper pole kidney stone, small hiatal hernia, colonic diverticula, retroperitoneal varices from SMV, atherosclerosis, large volume ascites; new bilateral perianal complex ovoid collections with serpiginous densities in the right-sided collection and linear densities in the left-sided collection Paracentesis under image guidance per Dr. Halford Chessman 01/04/2023    Consultations: PCCM: Dr. Ruthann Cancer 01/04/2023 Palliative care: Dr. Rowe Pavy 01/04/2023  Discharge Exam: Vitals:   01/08/23 2208 01/09/23 0952  BP:  (!) 95/50  Pulse:  77  Resp: 15 15  Temp:  (!) 97.5 F (36.4 C)  SpO2:  98%    General: NAD.  Bilateral upper extremities with some swelling and ecchymosis. Cardiovascular: RRR no murmurs rubs or gallops.  No JVD.  1-2+ bilateral lower extremity edema.   Respiratory: Clear to auscultation bilaterally anterior lung fields.  No wheezes, no crackles, no rhonchi.  Fair air movement.  Discharge Instructions   Discharge Instructions     Discharge wound care:   Complete by: As directed    Wound care to sacral stage 2 PI:  Cleanse with NS, pat dry. Cover with xeroform gauze, top with dry gauze 2x2 and secure with silicone foam for sacrum. Change xeroform daily. Change foam every other day and PRN rolling of dressing  edges or soiling.  01/04/23 1730     01/04/23 1719    Wound care  Every shift      Comments: Wound care to RLE skin tear: Cleanse with NS, pat dry. Cover with xeroform gauze, top with ABD pad and secure with Kerlix roll gauze/paper tape.    For home use only DME Other see comment   Complete by: As directed    Purewick foley please   Length of Need: Lifetime   Increase activity slowly   Complete by: As directed       Allergies as of 01/09/2023       Reactions   Dicyclomine Other (See Comments)   Dry cough   Glucophage [metformin] Diarrhea   Midodrine Hcl Nausea And Vomiting   Midol [acetaminophen] Other (See Comments)   Unknown    Losartan Other (See Comments)   Heartburn        Medication List     STOP taking these medications    apixaban 2.5 MG Tabs tablet Commonly known as: ELIQUIS   atorvastatin 20 MG tablet Commonly known as: LIPITOR   Cholecalciferol 50 MCG (2000 UT) Caps   diltiazem 120 MG 24 hr capsule Commonly known as: CARDIZEM CD   Eliquis 5 MG Tabs tablet Generic drug: apixaban   feeding supplement Liqd   lisinopril-hydrochlorothiazide 20-25 MG tablet Commonly known as: ZESTORETIC   midodrine 5 MG tablet Commonly known as: PROAMATINE   vitamin B-12 500 MCG tablet Commonly known as: CYANOCOBALAMIN       TAKE these medications    acetaminophen 500 MG tablet Commonly known as: TYLENOL Take 1,000 mg by mouth as needed for moderate pain.   glycopyrrolate 1 MG tablet Commonly known as: ROBINUL Take 1 tablet (1 mg total) by mouth every 4 (four) hours as needed (excessive secretions).   haloperidol 2 MG/ML solution Commonly known as: HALDOL Place 0.3 mLs (0.6 mg total) under the tongue every 4 (four) hours as needed for agitation (or delirium).   levothyroxine 150 MCG tablet Commonly known as: SYNTHROID Take 1 tablet (150 mcg total) by mouth daily at 6 (six) AM. Start taking on: January 10, 2023 What changed:  medication strength how much to take when to take this   LORazepam 2 MG/ML concentrated solution Commonly known as: ATIVAN Place 0.5 mLs (1 mg total) under the tongue every 4 (four) hours as needed for anxiety.   ondansetron 4 MG disintegrating tablet Commonly  known as: ZOFRAN-ODT Take 1 tablet (4 mg total) by mouth as needed for nausea or vomiting.   oxyCODONE 5 MG/5ML solution Commonly known as: ROXICODONE Take 2.5 mLs (2.5 mg total) by mouth every 4 (four) hours as needed for moderate pain or severe pain (dyspnea, RR >25).               Durable Medical Equipment  (From admission, onward)           Start     Ordered   01/09/23 0000  For home use only DME Other see comment       Comments: Purewick foley please  Question:  Length of Need  Answer:  Lifetime   01/09/23 1059              Discharge Care Instructions  (From admission, onward)           Start     Ordered   01/09/23 0000  Discharge wound care:       Comments: Wound care to sacral  stage 2 PI:  Cleanse with NS, pat dry. Cover with xeroform gauze, top with dry gauze 2x2 and secure with silicone foam for sacrum. Change xeroform daily. Change foam every other day and PRN rolling of dressing edges or soiling.  01/04/23 1730     01/04/23 1719    Wound care  Every shift      Comments: Wound care to RLE skin tear: Cleanse with NS, pat dry. Cover with xeroform gauze, top with ABD pad and secure with Kerlix roll gauze/paper tape.   01/09/23 1055           Allergies  Allergen Reactions   Dicyclomine Other (See Comments)    Dry cough   Glucophage [Metformin] Diarrhea   Midodrine Hcl Nausea And Vomiting   Midol [Acetaminophen] Other (See Comments)    Unknown    Losartan Other (See Comments)    Heartburn     Follow-up Information     Hospice MD Follow up.          Lindell Spar, MD. Schedule an appointment as soon as possible for a visit in 1 week(s).   Specialty: Internal Medicine Why: f/u in 1-2 weeks Contact information: [address] [place] [address] [phone]                  The results of significant diagnostics from this hospitalization (including imaging, microbiology, ancillary and laboratory) are listed below  for reference.    Significant Diagnostic Studies: DG CHEST PORT 1 VIEW  Result Date: 01/04/2023 CLINICAL DATA:  Check central line positioning. EXAM: PORTABLE CHEST 1 VIEW COMPARISON:  Portable chest yesterday 7:29 p.m., CTA chest yesterday at 10:28 p.m. FINDINGS: 10:46 p.m. A new left IJ central line has been inserted and terminates in the mid SVC. Right chest port with IJ approach catheter is again noted with the catheter tip at the superior cavoatrial junction level. No pneumothorax is seen. Moderate left pleural fluid continues to be noted with overlying haziness in the left base. Rest of lungs are generally clear apart from chronic linear scar-like opacities in the apices. The cardiac size is normal. The mediastinal configuration is unremarkable. There is aortic atherosclerosis. Diffuse osteopenia. IMPRESSION: 1. A new left IJ central line has been inserted and terminates in the mid SVC. No pneumothorax. 2. No interval change in the appearance of the chest since yesterday's study. Continued moderate left pleural fluid. Electronically Signed   By: Telford Nab M.D.   On: 01/04/2023 23:45   CT Angio Chest PE W and/or Wo Contrast  Result Date: 01/03/2023 CLINICAL DATA:  Shortness of breath and abdominal pain. History of NASH cirrhosis, clotting disorder, CKD. EXAM: CT ANGIOGRAPHY CHEST CT ABDOMEN AND PELVIS WITH CONTRAST TECHNIQUE: Multidetector CT imaging of the chest was performed using the standard protocol during bolus administration of intravenous contrast. Multiplanar CT image reconstructions and MIPs were obtained to evaluate the vascular anatomy. Multidetector CT imaging of the abdomen and pelvis was performed using the standard protocol during bolus administration of intravenous contrast. RADIATION DOSE REDUCTION: This exam was performed according to the departmental dose-optimization program which includes automated exposure control, adjustment of the mA and/or kV according to patient size  and/or use of iterative reconstruction technique. CONTRAST:  40m OMNIPAQUE IOHEXOL 350 MG/ML SOLN COMPARISON:  Portable chest today, AP Lat chest 11/14/2022, chest CT no contrast 10/27/2022, partial CTA chest 05/30/2022, CT abdomen and pelvis with contrast 11/14/2022, and CT abdomen and pelvis without contrast dated 10/24/2022 and 10/05/2022.  FINDINGS: CTA CHEST FINDINGS Cardiovascular: The cardiac size is normal. There is aortic atherosclerosis and mild tortuosity without aneurysm, dissection or stenosis. Scattered plaques in the great vessels without great vessel stenosis. There are calcifications in the inferior mitral ring and heavy three-vessel calcific CAD. There is no significant pericardial effusion. Pulmonary arteries are normal in caliber and clear at least through the segmental divisions. The subsegmental arterial bed largely not evaluated due to breathing motion. A subsegmental embolus could be missed. Pulmonary veins are decompressed. Mediastinum/Nodes: No intrathoracic or axillary adenopathy is seen and no thyroid mass, tracheal filling defect or esophageal thickening. Lungs/Pleura: Moderate left pleural effusion is again noted, first seen on the abdomen and pelvis CT dated 10/05/2022, only very minimally increased in size since last year's studies. There is overlying compressive atelectatic effect which does not appear consistent with consolidation. There is chronic scarring in both apices, anterior subpleural scarring and bronchiolectasis in the left upper lobe again noted. There is no pneumothorax or pleural fluid. The main bronchi and remaining lungs are clear. Musculoskeletal: Generalized chest wall anasarca has increased. There is thoracic dextroscoliosis and multilevel bridging enthesopathy. Osteopenia. The ribcage is intact. Review of the MIP images confirms the above findings. CT ABDOMEN and PELVIS FINDINGS Hepatobiliary: The liver is small and cirrhotic. There is mild steatosis with  improvement. Calcified granulomas in the right lobe. No liver mass is seen. Gallbladder is absent with stable chronic common bile duct prominence up to 11 mm. Pancreas: Diffusely atrophic and otherwise unremarkable. Spleen: Within normal size limits. There are scattered calcified granulomas. Adrenals/Urinary Tract: Bilateral cortical thinning left-greater-than-right, asymmetric left-sided volume loss left kidney. No adrenal mass. There is a 2 mm nonobstructive caliceal stone in the upper pole left kidney, punctate nonobstructive stone in the upper pole on the right. No hydronephrosis is seen. A 1 x 2 mm stone in the distal most left ureter is again shown and was also present on the prior exams as well, positioning is unaltered. No bladder thickening or intravesical stones. Stomach/Bowel: Small hiatal hernia. The stomach is contracted. Thickened folds continued debris seen in the jejunum. There is increased thickening versus underdistention the ascending and transverse colon. No bowel obstruction is evident. Scattered colonic diverticula without focal inflammation are again noted. Vascular/Lymphatic: Within normal caliber limits portal vein. No portal vein, splenic vein or SMV thrombosis. Retroperitoneal engorged varices from the SMV are again noted along side the abdominal aorta and left common iliac vasculature, emptying into the left common iliac vein. The abdominal aorta and iliac arteries are heavily calcified. No adenopathy is seen. Reproductive: Status post hysterectomy. No adnexal masses. Other: There is interval increase large-volume abdominal and pelvic free ascites. No free air is seen. To the left and right of midline in the base of the buttocks, there are bilateral new perianal complex ovoid collections, contents measuring at the high end of fluid density, measuring 4.8 x 3.5 cm on the right and 5.5 x 4 cm on the left. There are serpiginous densities within the right-sided collection and linear densities  within the left-sided collection which could indicate the presence of vascular flow or active bleeding within the collections such as due to bilateral actively bleeding hematomas. Please correlate the findings clinically. There is no air within either collection. Musculoskeletal: Moderate compression fracture L2 vertebral body is again noted. Osteopenia and degenerative change thoracic and lumbar spine. Multilevel acquired lumbar foraminal stenosis. Review of the MIP images confirms the above findings. IMPRESSION: 1. No arterial embolus is seen at least  through the segmental divisions. The subsegmental arterial tree is largely obscured due to breathing motion. 2. Moderate left pleural effusion with overlying compressive atelectatic effect, only very minimally increased in size since last year's studies, and has been present at least as far back as October 2023 abdomen and pelvis CT. 3. Increased generalized anasarca. 4. Aortic and coronary artery atherosclerosis. 5. Cirrhotic liver with improvement in hepatic steatosis. No liver mass is seen. 6. Increased large-volume free ascites. 7. Increased thickening versus underdistention or portal colopathy of the ascending and transverse colon. 8. Gastroenteritis versus changes due to portal hypertension. No bowel obstruction or inflammation. 9. New bilateral perianal complex ovoid collections with serpiginous densities in the right-sided collection and linear densities in the left-sided collection, which could indicate the presence of vascular flow or active bleeding within the collections such as due to bilateral actively bleeding hematomas. Please correlate the findings clinically. 10. Bilateral nonobstructive nephrolithiasis with 1 x 2 mm stone in the distal most left ureter but no hydronephrosis. This was seen on all prior studies listed above. 11. Additional findings described above. 12. These results will be called to the ordering clinician or representative by the  Radiologist Assistant, and communication documented in the PACS or [employer]. Aortic Atherosclerosis (ICD10-I70.0). Electronically Signed   By: Telford Nab M.D.   On: 01/03/2023 23:55   CT ABDOMEN PELVIS W CONTRAST  Result Date: 01/03/2023 CLINICAL DATA:  Shortness of breath and abdominal pain. History of NASH cirrhosis, clotting disorder, CKD. EXAM: CT ANGIOGRAPHY CHEST CT ABDOMEN AND PELVIS WITH CONTRAST TECHNIQUE: Multidetector CT imaging of the chest was performed using the standard protocol during bolus administration of intravenous contrast. Multiplanar CT image reconstructions and MIPs were obtained to evaluate the vascular anatomy. Multidetector CT imaging of the abdomen and pelvis was performed using the standard protocol during bolus administration of intravenous contrast. RADIATION DOSE REDUCTION: This exam was performed according to the departmental dose-optimization program which includes automated exposure control, adjustment of the mA and/or kV according to patient size and/or use of iterative reconstruction technique. CONTRAST:  24m OMNIPAQUE IOHEXOL 350 MG/ML SOLN COMPARISON:  Portable chest today, AP Lat chest 11/14/2022, chest CT no contrast 10/27/2022, partial CTA chest 05/30/2022, CT abdomen and pelvis with contrast 11/14/2022, and CT abdomen and pelvis without contrast dated 10/24/2022 and 10/05/2022. FINDINGS: CTA CHEST FINDINGS Cardiovascular: The cardiac size is normal. There is aortic atherosclerosis and mild tortuosity without aneurysm, dissection or stenosis. Scattered plaques in the great vessels without great vessel stenosis. There are calcifications in the inferior mitral ring and heavy three-vessel calcific CAD. There is no significant pericardial effusion. Pulmonary arteries are normal in caliber and clear at least through the segmental divisions. The subsegmental arterial bed largely not evaluated due to breathing motion. A subsegmental embolus could be missed.  Pulmonary veins are decompressed. Mediastinum/Nodes: No intrathoracic or axillary adenopathy is seen and no thyroid mass, tracheal filling defect or esophageal thickening. Lungs/Pleura: Moderate left pleural effusion is again noted, first seen on the abdomen and pelvis CT dated 10/05/2022, only very minimally increased in size since last year's studies. There is overlying compressive atelectatic effect which does not appear consistent with consolidation. There is chronic scarring in both apices, anterior subpleural scarring and bronchiolectasis in the left upper lobe again noted. There is no pneumothorax or pleural fluid. The main bronchi and remaining lungs are clear. Musculoskeletal: Generalized chest wall anasarca has increased. There is thoracic dextroscoliosis and multilevel bridging enthesopathy. Osteopenia. The ribcage is intact. Review of the  MIP images confirms the above findings. CT ABDOMEN and PELVIS FINDINGS Hepatobiliary: The liver is small and cirrhotic. There is mild steatosis with improvement. Calcified granulomas in the right lobe. No liver mass is seen. Gallbladder is absent with stable chronic common bile duct prominence up to 11 mm. Pancreas: Diffusely atrophic and otherwise unremarkable. Spleen: Within normal size limits. There are scattered calcified granulomas. Adrenals/Urinary Tract: Bilateral cortical thinning left-greater-than-right, asymmetric left-sided volume loss left kidney. No adrenal mass. There is a 2 mm nonobstructive caliceal stone in the upper pole left kidney, punctate nonobstructive stone in the upper pole on the right. No hydronephrosis is seen. A 1 x 2 mm stone in the distal most left ureter is again shown and was also present on the prior exams as well, positioning is unaltered. No bladder thickening or intravesical stones. Stomach/Bowel: Small hiatal hernia. The stomach is contracted. Thickened folds continued debris seen in the jejunum. There is increased thickening versus  underdistention the ascending and transverse colon. No bowel obstruction is evident. Scattered colonic diverticula without focal inflammation are again noted. Vascular/Lymphatic: Within normal caliber limits portal vein. No portal vein, splenic vein or SMV thrombosis. Retroperitoneal engorged varices from the SMV are again noted along side the abdominal aorta and left common iliac vasculature, emptying into the left common iliac vein. The abdominal aorta and iliac arteries are heavily calcified. No adenopathy is seen. Reproductive: Status post hysterectomy. No adnexal masses. Other: There is interval increase large-volume abdominal and pelvic free ascites. No free air is seen. To the left and right of midline in the base of the buttocks, there are bilateral new perianal complex ovoid collections, contents measuring at the high end of fluid density, measuring 4.8 x 3.5 cm on the right and 5.5 x 4 cm on the left. There are serpiginous densities within the right-sided collection and linear densities within the left-sided collection which could indicate the presence of vascular flow or active bleeding within the collections such as due to bilateral actively bleeding hematomas. Please correlate the findings clinically. There is no air within either collection. Musculoskeletal: Moderate compression fracture L2 vertebral body is again noted. Osteopenia and degenerative change thoracic and lumbar spine. Multilevel acquired lumbar foraminal stenosis. Review of the MIP images confirms the above findings. IMPRESSION: 1. No arterial embolus is seen at least through the segmental divisions. The subsegmental arterial tree is largely obscured due to breathing motion. 2. Moderate left pleural effusion with overlying compressive atelectatic effect, only very minimally increased in size since last year's studies, and has been present at least as far back as October 2023 abdomen and pelvis CT. 3. Increased generalized anasarca. 4.  Aortic and coronary artery atherosclerosis. 5. Cirrhotic liver with improvement in hepatic steatosis. No liver mass is seen. 6. Increased large-volume free ascites. 7. Increased thickening versus underdistention or portal colopathy of the ascending and transverse colon. 8. Gastroenteritis versus changes due to portal hypertension. No bowel obstruction or inflammation. 9. New bilateral perianal complex ovoid collections with serpiginous densities in the right-sided collection and linear densities in the left-sided collection, which could indicate the presence of vascular flow or active bleeding within the collections such as due to bilateral actively bleeding hematomas. Please correlate the findings clinically. 10. Bilateral nonobstructive nephrolithiasis with 1 x 2 mm stone in the distal most left ureter but no hydronephrosis. This was seen on all prior studies listed above. 11. Additional findings described above. 12. These results will be called to the ordering clinician or representative by the Radiologist  Environmental consultant, and communication documented in the PACS or [employer]. Aortic Atherosclerosis (ICD10-I70.0). Electronically Signed   By: Telford Nab M.D.   On: 01/03/2023 23:55   DG Chest Port 1 View  Result Date: 01/03/2023 CLINICAL DATA:  Fatigue, shortness of breath EXAM: PORTABLE CHEST 1 VIEW COMPARISON:  11/14/2022 FINDINGS: Lungs are essentially clear. Osseous spurring related to the anterior right 1st rib along the medial right upper lung, without corresponding parenchymal lesion on prior CT. No pleural effusion or pneumothorax. The heart is normal in size. Right chest power port terminates at the cavoatrial junction. IMPRESSION: No evidence of acute cardiopulmonary disease. Electronically Signed   By: Julian Hy M.D.   On: 01/03/2023 19:44   IR IMAGING GUIDED PORT INSERTION  Result Date: 12/25/2022 CLINICAL DATA:  Chronic anemia requiring frequent blood draws and IV iron infusions.  Poor intravenous access and now need for porta cath for durable venous access. EXAM: IMPLANTED PORT A CATH PLACEMENT WITH ULTRASOUND AND FLUOROSCOPIC GUIDANCE ANESTHESIA/SEDATION: Moderate (conscious) sedation was employed during this procedure. A total of Versed 1.0 mg and Fentanyl 5 mcg was administered intravenously by radiology nursing. Moderate Sedation Time: 35 minutes. The patient's level of consciousness and vital signs were monitored continuously by radiology nursing throughout the procedure under my direct supervision. FLUOROSCOPY: 6 seconds.  1.0 mGy. PROCEDURE: The procedure, risks, benefits, and alternatives were explained to the patient. Questions regarding the procedure were encouraged and answered. The patient understands and consents to the procedure. A time-out was performed prior to initiating the procedure. Ultrasound was utilized to confirm patency of the right internal jugular vein. A permanent ultrasound image was saved and recorded. The right neck and chest were prepped with chlorhexidine in a sterile fashion, and a sterile drape was applied covering the operative field. Maximum barrier sterile technique with sterile gowns and gloves were used for the procedure. Local anesthesia was provided with 1% lidocaine. After creating a small venotomy incision, a 21 gauge needle was advanced into the right internal jugular vein under direct, real-time ultrasound guidance. Ultrasound image documentation was performed. After securing guidewire access, an 8 Fr dilator was placed. A J-wire was kinked to measure appropriate catheter length. A subcutaneous port pocket was then created along the upper chest wall utilizing sharp and blunt dissection. Portable cautery was utilized. The pocket was irrigated with sterile saline. A single lumen power injectable port was chosen for placement. The 8 Fr catheter was tunneled from the port pocket site to the venotomy incision. The port was placed in the pocket.  External catheter was trimmed to appropriate length based on guidewire measurement. At the venotomy, an 8 Fr peel-away sheath was placed over a guidewire. The catheter was then placed through the sheath and the sheath removed. Final catheter positioning was confirmed and documented with a fluoroscopic spot image. The port was accessed with a needle and aspirated and flushed with heparinized saline. The access needle was removed. The venotomy and port pocket incisions were closed with subcutaneous 3-0 Monocryl and subcuticular 4-0 Vicryl. Dermabond was applied to both incisions. COMPLICATIONS: COMPLICATIONS None FINDINGS: After catheter placement, the tip lies at the cavo-atrial junction. The catheter aspirates normally and is ready for immediate use. IMPRESSION: Placement of single lumen port a cath via right internal jugular vein. The catheter tip lies at the cavo-atrial junction. A power injectable port a cath was placed and is ready for immediate use. Electronically Signed   By: Aletta Edouard M.D.   On: 12/25/2022 12:13  Microbiology: Recent Results (from the past 240 hour(s))  Blood Culture (routine x 2)     Status: None   Collection Time: 01/03/23  7:45 PM   Specimen: BLOOD  Result Value Ref Range Status   Specimen Description BLOOD SITE NOT SPECIFIED  Final   Special Requests   Final    BOTTLES DRAWN AEROBIC AND ANAEROBIC Blood Culture results may not be optimal due to an excessive volume of blood received in culture bottles   Culture   Final    NO GROWTH 5 DAYS Performed at [facility], [place] [address], [place], [place] [address]    Report Status 01/08/2023 FINAL  Final  Urine Culture     Status: Abnormal   Collection Time: 01/03/23 11:25 PM   Specimen: Urine, Clean Catch  Result Value Ref Range Status   Specimen Description URINE, CLEAN CATCH  Final   Special Requests   Final    NONE Performed at [facility], [place] [address], [place], [place] [address]    Culture  MULTIPLE SPECIES PRESENT, SUGGEST RECOLLECTION (A)  Final   Report Status 01/05/2023 FINAL  Final  MRSA Next Gen by PCR, Nasal     Status: None   Collection Time: 01/04/23  5:42 AM   Specimen: Nasal Mucosa; Nasal Swab  Result Value Ref Range Status   MRSA by PCR Next Gen NOT DETECTED NOT DETECTED Final    Comment: (NOTE) The GeneXpert MRSA Assay (FDA approved for NASAL specimens only), is one component of a comprehensive MRSA colonization surveillance program. It is not intended to diagnose MRSA infection nor to guide or monitor treatment for MRSA infections. Test performance is not FDA approved in patients less than 39 years old. Performed at [facility], [place] [address], [place], [place] [address]   Resp panel by RT-PCR (RSV, Flu A&B, Covid) Anterior Nasal Swab     Status: None   Collection Time: 01/04/23  5:42 AM   Specimen: Anterior Nasal Swab  Result Value Ref Range Status   SARS Coronavirus 2 by RT PCR NEGATIVE NEGATIVE Final    Comment: (NOTE) SARS-CoV-2 target nucleic acids are NOT DETECTED.  The SARS-CoV-2 RNA is generally detectable in upper respiratory specimens during the acute phase of infection. The lowest concentration of SARS-CoV-2 viral copies this assay can detect is 138 copies/mL. A negative result does not preclude SARS-Cov-2 infection and should not be used as the sole basis for treatment or other patient management decisions. A negative result may occur with  improper specimen collection/handling, submission of specimen other than nasopharyngeal swab, presence of viral mutation(s) within the areas targeted by this assay, and inadequate number of viral copies(<138 copies/mL). A negative result must be combined with clinical observations, patient history, and epidemiological information. The expected result is Negative.  Fact Sheet for Patients:  EntrepreneurPulse.com.au  Fact Sheet for Healthcare Providers:   IncredibleEmployment.be  This test is no t yet approved or cleared by the [place] FDA and  has been authorized for detection and/or diagnosis of SARS-CoV-2 by FDA under an Emergency Use Authorization (EUA). This EUA will remain  in effect (meaning this test can be used) for the duration of the COVID-19 declaration under Section 564(b)(1) of the Act, 21 U.S.C.section 360bbb-3(b)(1), unless the authorization is terminated  or revoked sooner.       Influenza A by PCR NEGATIVE NEGATIVE Final   Influenza B by PCR NEGATIVE NEGATIVE Final    Comment: (NOTE) The Xpert  Xpress SARS-CoV-2/FLU/RSV plus assay is intended as an aid in the diagnosis of influenza from Nasopharyngeal swab specimens and should not be used as a sole basis for treatment. Nasal washings and aspirates are unacceptable for Xpert Xpress SARS-CoV-2/FLU/RSV testing.  Fact Sheet for Patients: EntrepreneurPulse.com.au  Fact Sheet for Healthcare Providers: IncredibleEmployment.be  This test is not yet approved or cleared by the [place] FDA and has been authorized for detection and/or diagnosis of SARS-CoV-2 by FDA under an Emergency Use Authorization (EUA). This EUA will remain in effect (meaning this test can be used) for the duration of the COVID-19 declaration under Section 564(b)(1) of the Act, 21 U.S.C. section 360bbb-3(b)(1), unless the authorization is terminated or revoked.     Resp Syncytial Virus by PCR NEGATIVE NEGATIVE Final    Comment: (NOTE) Fact Sheet for Patients: EntrepreneurPulse.com.au  Fact Sheet for Healthcare Providers: IncredibleEmployment.be  This test is not yet approved or cleared by the [place] FDA and has been authorized for detection and/or diagnosis of SARS-CoV-2 by FDA under an Emergency Use Authorization (EUA). This EUA will remain in effect (meaning this test can be used) for  the duration of the COVID-19 declaration under Section 564(b)(1) of the Act, 21 U.S.C. section 360bbb-3(b)(1), unless the authorization is terminated or revoked.  Performed at [facility], [place] [address], [place], [place] [address]   MRSA Next Gen by PCR, Nasal     Status: Abnormal   Collection Time: 01/04/23 11:05 AM   Specimen: Nasal Mucosa; Nasal Swab  Result Value Ref Range Status   MRSA by PCR Next Gen DETECTED (A) NOT DETECTED Final    Comment: (NOTE) The GeneXpert MRSA Assay (FDA approved for NASAL specimens only), is one component of a comprehensive MRSA colonization surveillance program. It is not intended to diagnose MRSA infection nor to guide or monitor treatment for MRSA infections. Test performance is not FDA approved in patients less than 10 years old. Performed at [facility], [place] [address], [place], [place] [address]   Body fluid culture w Gram Stain     Status: None   Collection Time: 01/04/23  3:10 PM   Specimen: Paracentesis; Body Fluid  Result Value Ref Range Status   Specimen Description   Final    PARACENTESIS Performed at [place] [address], [place], [place] [address]    Special Requests   Final    NONE Performed at [facility], [place] [address], [place], [place] [address]    Gram Stain NO ORGANISMS SEEN NO WBC SEEN   Final   Culture   Final    NO GROWTH 3 DAYS Performed at [facility], [place] [address] [place], [place] [address]    Report Status 01/08/2023 FINAL  Final     Labs: Basic Metabolic Panel: Recent Labs  Lab 01/03/23 1927 01/04/23 0405 01/05/23 0451 01/06/23 0226  NA 138 136 130* 128*  K 3.5 3.4* 2.9* 4.0  CL 109 109 103 105  CO2 19* 18* 19* 18*  GLUCOSE 118* 153* 168* 167*  BUN 27* 29* 25* 29*  CREATININE 1.68* 1.73* 1.74* 1.69*  CALCIUM 7.7* 7.5* 7.1* 6.9*  MG  --  2.0 1.7 1.7  PHOS  --  3.0 2.3* 3.0   Liver Function Tests: Recent Labs   Lab 01/03/23 1927 01/04/23 0405 01/05/23 0451  AST 33 31 29  ALT '14 15 15  '$ ALKPHOS 69 65 59  BILITOT 0.9 1.0 1.2  PROT 4.3* 4.0* 3.7*  ALBUMIN 1.9* 1.8* 1.7*   Recent Labs  Lab 01/03/23 1927  LIPASE 26   Recent Labs  Lab 01/03/23 1927 01/05/23 0500  AMMONIA 46* 33   CBC: Recent Labs  Lab 01/03/23 1927 01/04/23 0405 01/04/23 1659 01/05/23 0451 01/06/23 0226  WBC 4.7 7.3  --  9.9 8.8  NEUTROABS 3.4  --   --   --  5.7  HGB 8.6* 7.4* 6.8* 8.6* 7.8*  HCT 25.6* 22.6* 20.5* 25.4* 23.0*  MCV 100.8* 100.9*  --  98.4 97.9  PLT 171 201  --  159 119*   Cardiac Enzymes: No results for input(s): "CKTOTAL", "CKMB", "CKMBINDEX", "TROPONINI" in the last 168 hours. BNP: BNP (last 3 results) Recent Labs    10/20/22 0820 10/23/22 1431 01/03/23 1927  BNP 933.0* 328.0* 259.3*    ProBNP (last 3 results) No results for input(s): "PROBNP" in the last 8760 hours.  CBG: Recent Labs  Lab 01/06/23 0909 01/06/23 1224 01/06/23 1655 01/06/23 2104 01/07/23 0806  GLUCAP 151* 136* 138* 153* 215*       Signed:  Irine Seal MD.  [facility] 01/09/2023, 2:49 PM

## 2023-01-09 NOTE — Progress Notes (Signed)
Pt discharged home with hospice. [facility] at bedside to transport pt home. Discharge packet given with DNR paperwork. Purewick was discontinued. Pt discharged with fecal management device in place. Pt [name] called by primary RN to inform that EMS was picking up pt and heading to her home.

## 2023-01-09 NOTE — Progress Notes (Signed)
Daily Progress Note   Patient Name: Tasha Roberts       Date: 01/09/2023 DOB: 1941/08/19  Age: 82 y.o. MRN#: [id number] Attending Physician: Eugenie Filler, MD Primary Care Physician: Lindell Spar, MD Admit Date: 01/03/2023 Length of Stay: 5 days  Reason for Consultation/Follow-up: Establishing goals of care  Subjective:   CC: Patient very sleepy today though still planning to get home with hospice.   Following up regarding end of life management.    Subjective:  Reviewed EMR prior to presenting to bedside.  At time of EMR review, patient has received IV Dilaudid 0.2 mg x 3 doses.  Discussed care with bedside RN prior to seeing patient this morning.  Patient was evaluated by [facility] her care hospice yesterday and plan is to go home with their support.  Presented to bedside to discuss care with patient and family.  Patient is sleepier this morning though denies any complaints currently.  Discussed we added solution medications for pain and anxiety which should be provided at time of discharge so patient can go home  with medications until hospice can provide these medications.  Discussed hope that patient could get home today with hospice support as has already been accepted by [facility] or care home hospice.  Spent time answering questions and providing emotional support.  Family inquiring about getting patient PureWick at home so informed hospitalist of order for this.  Thanked patient and family for allowing me to visit and hoped her safe travels.  Discussed care with bedside RN and hospitalist again after visit.  Also signed DNR form for patient to go home with.  Review of Systems Denies any symptoms of concern  Objective:   Vital Signs:  BP (!) 94/50 (BP Location: Right Arm)   Pulse 66   Temp 97.8 F (36.6 C) (Oral)   Resp 15   Ht '5\' 3"'$  (1.6 m)   Wt 68 kg   SpO2 100%   BMI 26.56 kg/m   Physical Exam: General: Sleepy, pleasant, laying in bed, chronically  ill-appearing Eyes: Moist mucous membranes Cardiovascular: RRR, anasarca present in all extremities with weeping from legs Respiratory: no increased work of breathing noted, not in respiratory distress Abdomen: Soft Extremities: muscle wasting present in extremities  Skin: Extensive ecchymoses on upper extremities and lower extremities bilaterally Neuro: Sleepy  Imaging: I personally reviewed recent imaging.   Assessment & Plan:   Assessment:  Patient is a 83 year old female with a past medical history of chronic hypotension on midodrine, NASH cirrhosis, A-fib on Eliquis, stage IV CKD, iron deficiency anemia, hypothyroidism, dyslipidemia, osteoporosis, depression, type 2 diabetes, GERD, and Graves' disease who was admitted on 01/03/2023 for management of weakness and shortness of breath.  Since being admitted to the hospital patient has been admitted to the [facility] service.  Receiving support in ICU for hypotension, NASH cirrhosis with ascites, possible sepsis from urinary source, and management of chronic left pleural effusion.  Recommendations/Plan: # Complex medical decision making/goals of care:  - Patient was transition to full comfort care on 01/07/2023.  Based on current evaluation, will work to get patient home with hospice support through [facility], hopefully today.    -Patient does have ACP documentation on file naming her HCPOA as Royetta Crochet and primary alternate as Dollene Cleveland. This documentation would not take affect unless patient was unable to make her own medical decisions which is able to do at this time.   - Code Status: DNR   # Symptom management: To be discharged  home with medications for symptom management.     -Pain/Dyspnea, acute in the setting of end-of-life care                               -Has oxycodone solution 2.5 mg every 4 hours as needed.  Patient has IV Dilaudid for breakthrough management.                 -Anxiety/agitation, in the setting of end-of-life  care                               -Has po/SL Ativan '1mg'$  every 4 hours as needed. Continue to adjust based on patient's symptom burden.                                 -And also has IV/po/SL Haldol 0.5 mg every 4 hours as needed. Continue to adjust based on patient's symptom burden.                   -Secretions, in the setting of end-of-life care                               -As IV/po glycopyrrolate every 4 hours as needed.  # Psychosocial Support:  -Multiple family members, including daughter, present at bedside visiting today.  # Discharge Planning: Plan for home with [facility] hospice today hopefully  Discussed with: bedside RN, patient, family, care team  Thank you for allowing the palliative care team to participate in the care Tasha Nearing, DO Palliative Care Provider PMT # [phone]  This provider spent a total of 39 minutes providing patient's care.  Includes review of EMR, discussing care with other staff members involved in patient's medical care, obtaining relevant history and information from patient and/or patient's family, and personal review of imaging and lab work. Greater than 50% of the time was spent counseling and coordinating care related to the above assessment and plan.

## 2023-01-09 NOTE — Progress Notes (Signed)
[facility])   If applicable, please send signed and completed DNR with patient/family upon discharge. Please provide prescriptions at discharge as needed to ensure ongoing symptom management and a transport packet.   [facility] information and contact numbers given to family and above information shared with TOC.    Please call with any questions/concerns.    Thank you for the opportunity to participate in this patient's care   Phillis Haggis, MSW [facility] Liaison  [phone]

## 2023-01-10 ENCOUNTER — Encounter (HOSPITAL_COMMUNITY): Payer: Self-pay | Admitting: Physician Assistant

## 2023-01-12 NOTE — Telephone Encounter (Signed)
Hospice aware

## 2023-01-19 ENCOUNTER — Inpatient Hospital Stay: Payer: Medicare Other | Admitting: Physician Assistant

## 2023-01-21 ENCOUNTER — Telehealth: Payer: Self-pay | Admitting: Internal Medicine

## 2023-01-22 ENCOUNTER — Telehealth: Payer: Medicare Other | Admitting: Medical

## 2023-02-06 NOTE — Telephone Encounter (Signed)
Rickey Barbara called from [facility] patient passed way 01-29-23 at 10:27 am

## 2023-02-06 NOTE — Telephone Encounter (Signed)
FYI, Patient daughter called to let Dr Posey Pronto know  patient passed away 26-Jan-2023 at  9:30 am

## 2023-02-06 DEATH — deceased

## 2024-01-07 IMAGING — CT CT ABD-PELV W/ CM
2 of 5 series · 16 of 46 positions shown, 18 images · IV contrast (Omnipaque or Isovue)
Comparison: None.

CLINICAL DATA: Nausea/vomiting Abdominal pain, acute, nonlocalized
Fever. lower abdominal pain (acute on chronic) with 7 bowel
movements today and vomited 6 times.

EXAM:
CT ABDOMEN AND PELVIS WITH CONTRAST
TECHNIQUE: Multidetector CT imaging of the abdomen and pelvis was performed
using the standard protocol following bolus administration of
intravenous contrast.

[Series 2: axial st · axial · 0.81mm/px · z∈[+671,+1096]mm · 13 of 95 slices shown, 15 images]
[im 5/95  soft-tissue]
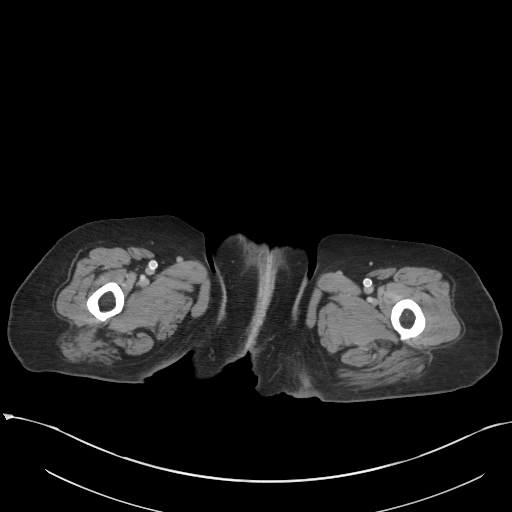
[im 5/95  bone]
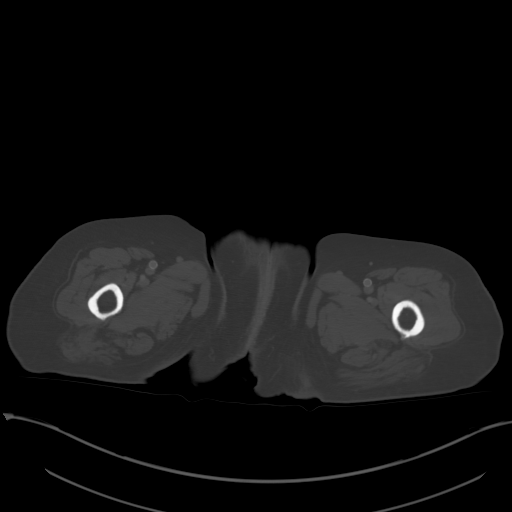
[im 15/95  soft-tissue]
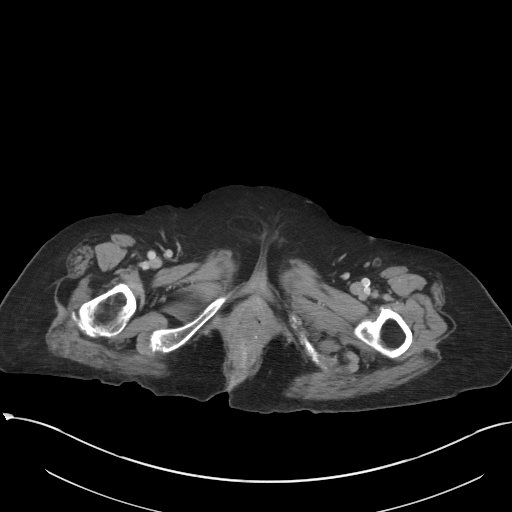
[im 20/95  soft-tissue]
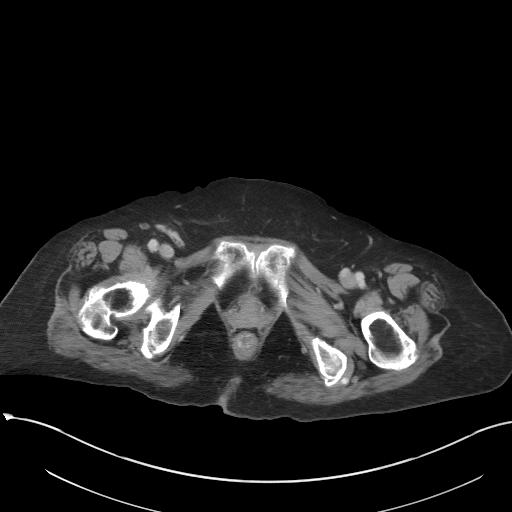
[im 25/95  soft-tissue]
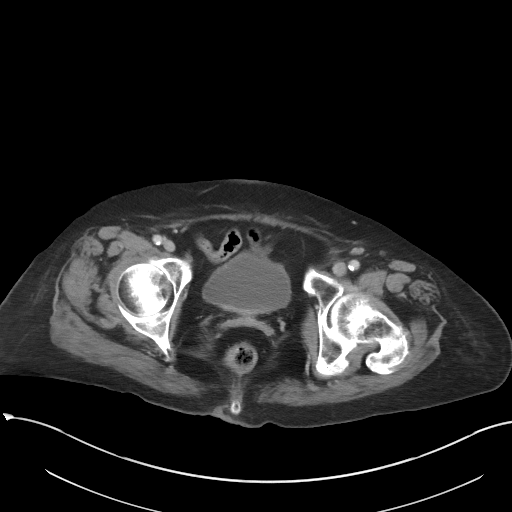
[im 35/95  soft-tissue]
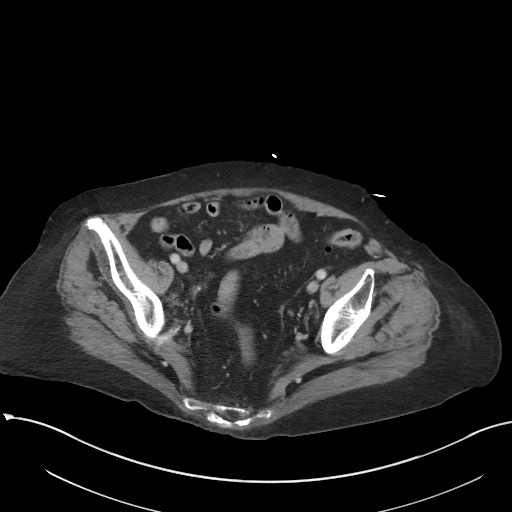
[im 40/95  soft-tissue]
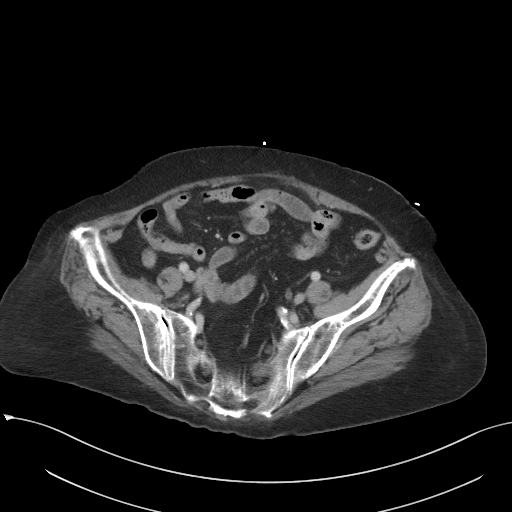
[im 50/95  soft-tissue]
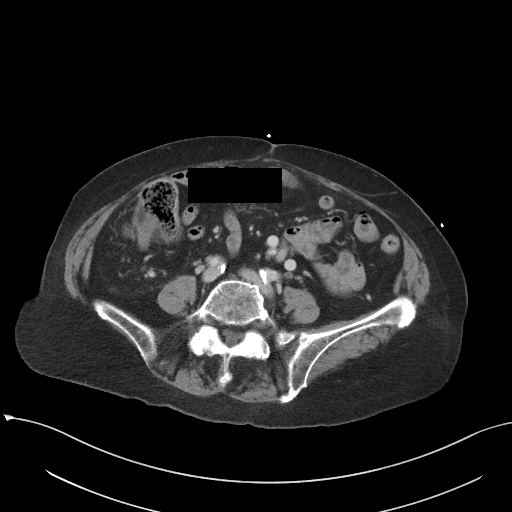
[im 55/95  soft-tissue]
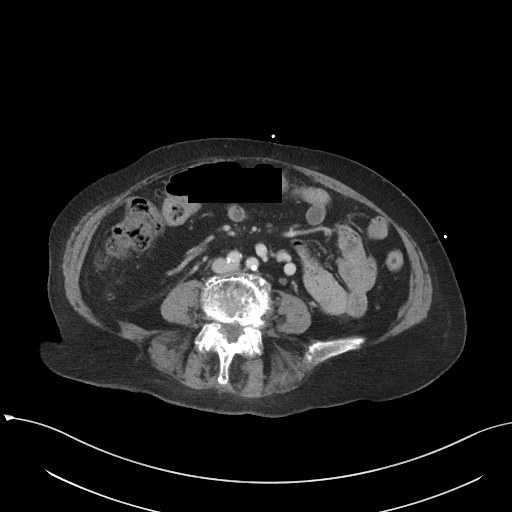
[im 60/95  soft-tissue]
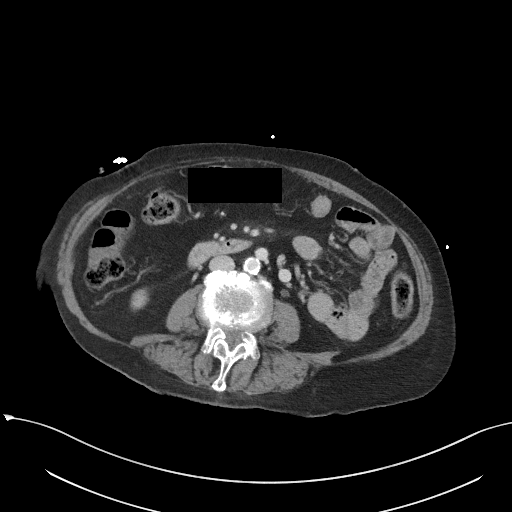
[im 60/95  bone]
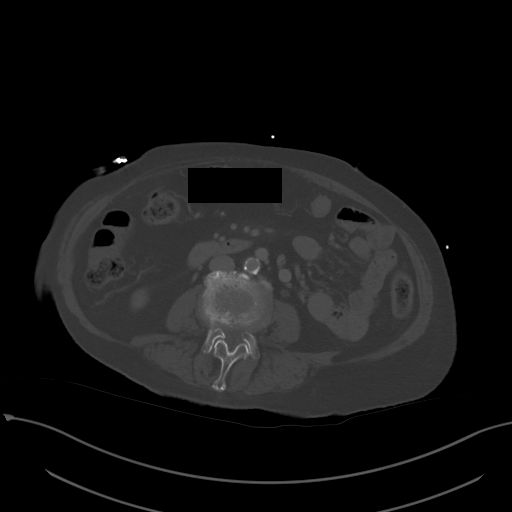
[im 70/95  soft-tissue]
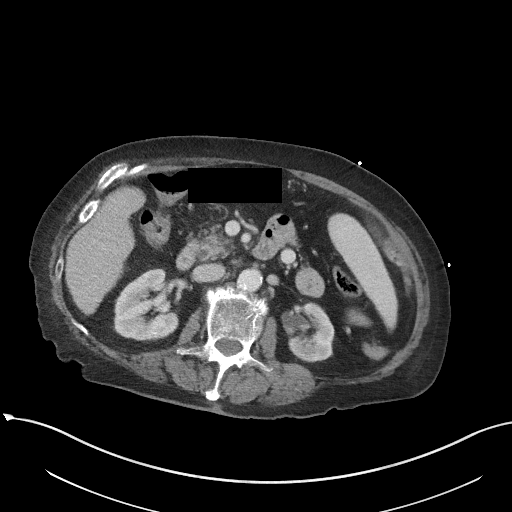
[im 75/95  soft-tissue]
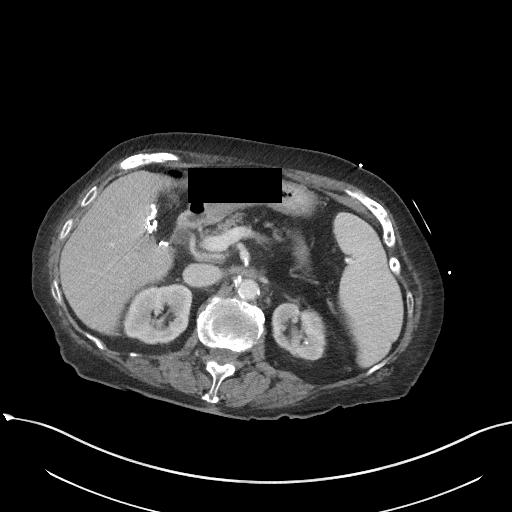
[im 80/95  soft-tissue]
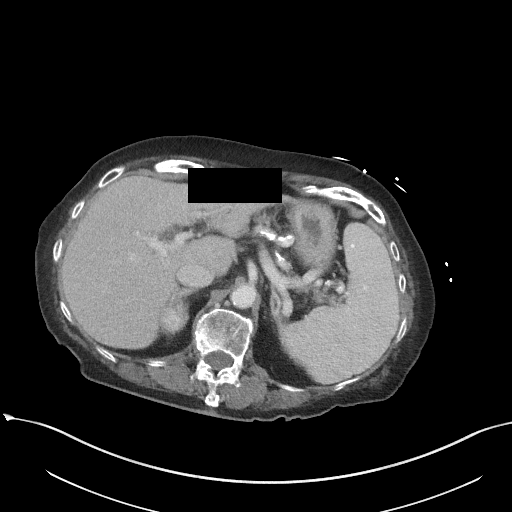
[im 90/95  soft-tissue]
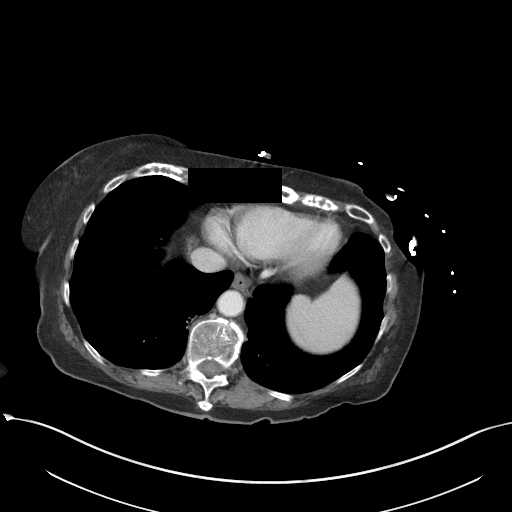

[Series 6: coronal st · coronal · 0.72mm/px · 3 of 114 slices shown]
[im 38/114  soft-tissue]
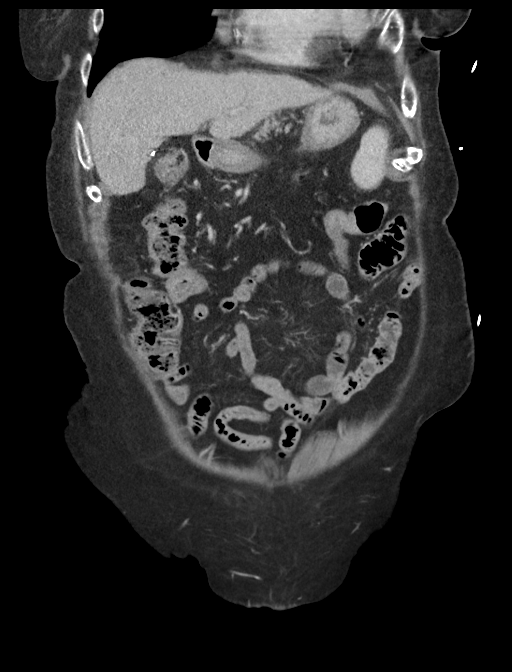
[im 51/114  soft-tissue]
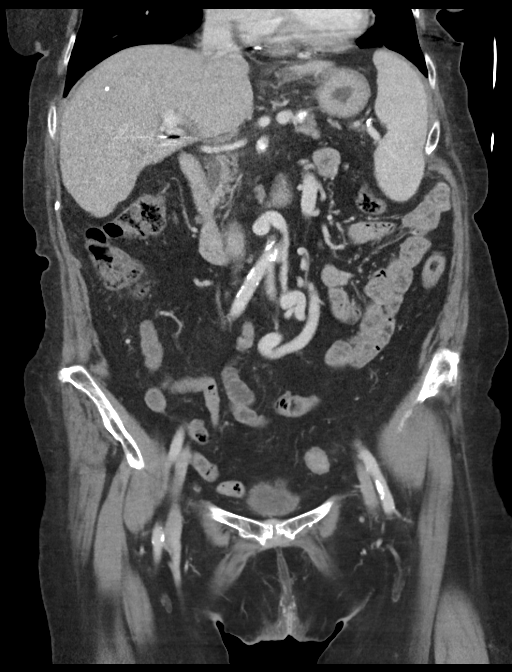
[im 63/114  soft-tissue]
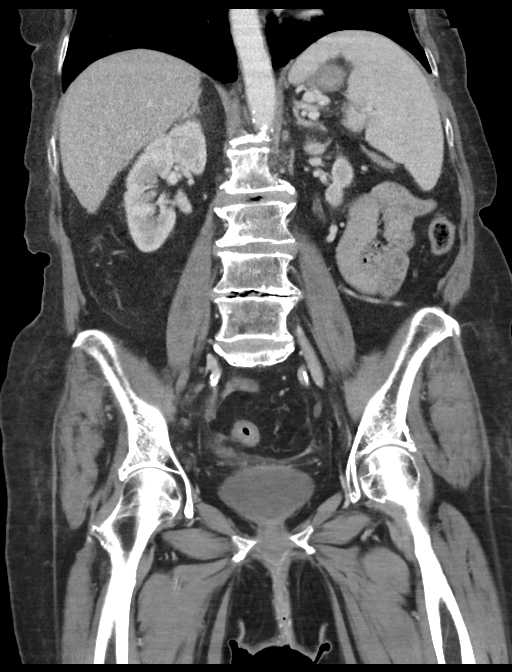

[16 of 46 positions shown; findings below may reference images not displayed]

RADIATION DOSE REDUCTION: This exam was performed according to the
departmental dose-optimization program which includes automated
exposure control, adjustment of the mA and/or kV according to
patient size and/or use of iterative reconstruction technique.

CONTRAST:  100mL OMNIPAQUE IOHEXOL 300 MG/ML  SOLN
FINDINGS: Lower chest: Mild bibasilar atelectasis/scarring.

Hepatobiliary: Few scattered calcified granulomas. Cholecystectomy.
No biliary dilatation.

Pancreas: Unremarkable.

Spleen: Top-normal in size.  Few scattered calcified granulomas.

Adrenals/Urinary Tract: Adrenals are unremarkable. Left renal
atrophy. Minimal punctate 1 mm nonobstructing left renal calculi. 2
mm focus of calcification at the left ureterovesical junction
(series 2, image 70). There is mild prominence of the left ureter
and left renal pelvis. Partially distended bladder is unremarkable.

Stomach/Bowel: Stomach is within normal limits. Bowel is normal in
caliber. Appendix is not identified. Distal colonic diverticulosis.

Vascular/Lymphatic: Diffuse atherosclerosis.  No enlarged nodes.

Reproductive: Status post hysterectomy. No adnexal masses.

Other: No free fluid.  Abdominal wall is unremarkable.

Musculoskeletal: Degenerative changes of the lumbar spine.
IMPRESSION: 2 mm calcification at the left ureterovesical junction without
rounded configuration of a stone. However, there is mild prominence
of the left ureter and left renal pelvis.

Minimal punctate nonobstructing left renal calculi.

Colonic diverticulosis.

## 2024-01-07 IMAGING — DX DG CHEST 2V
2 series · 2 of 2 positions shown · non-contrast
Comparison: Chest x-ray 10/02/2020.

CLINICAL DATA: Fever.

EXAM:
CHEST - 2 VIEW

[chest lat]
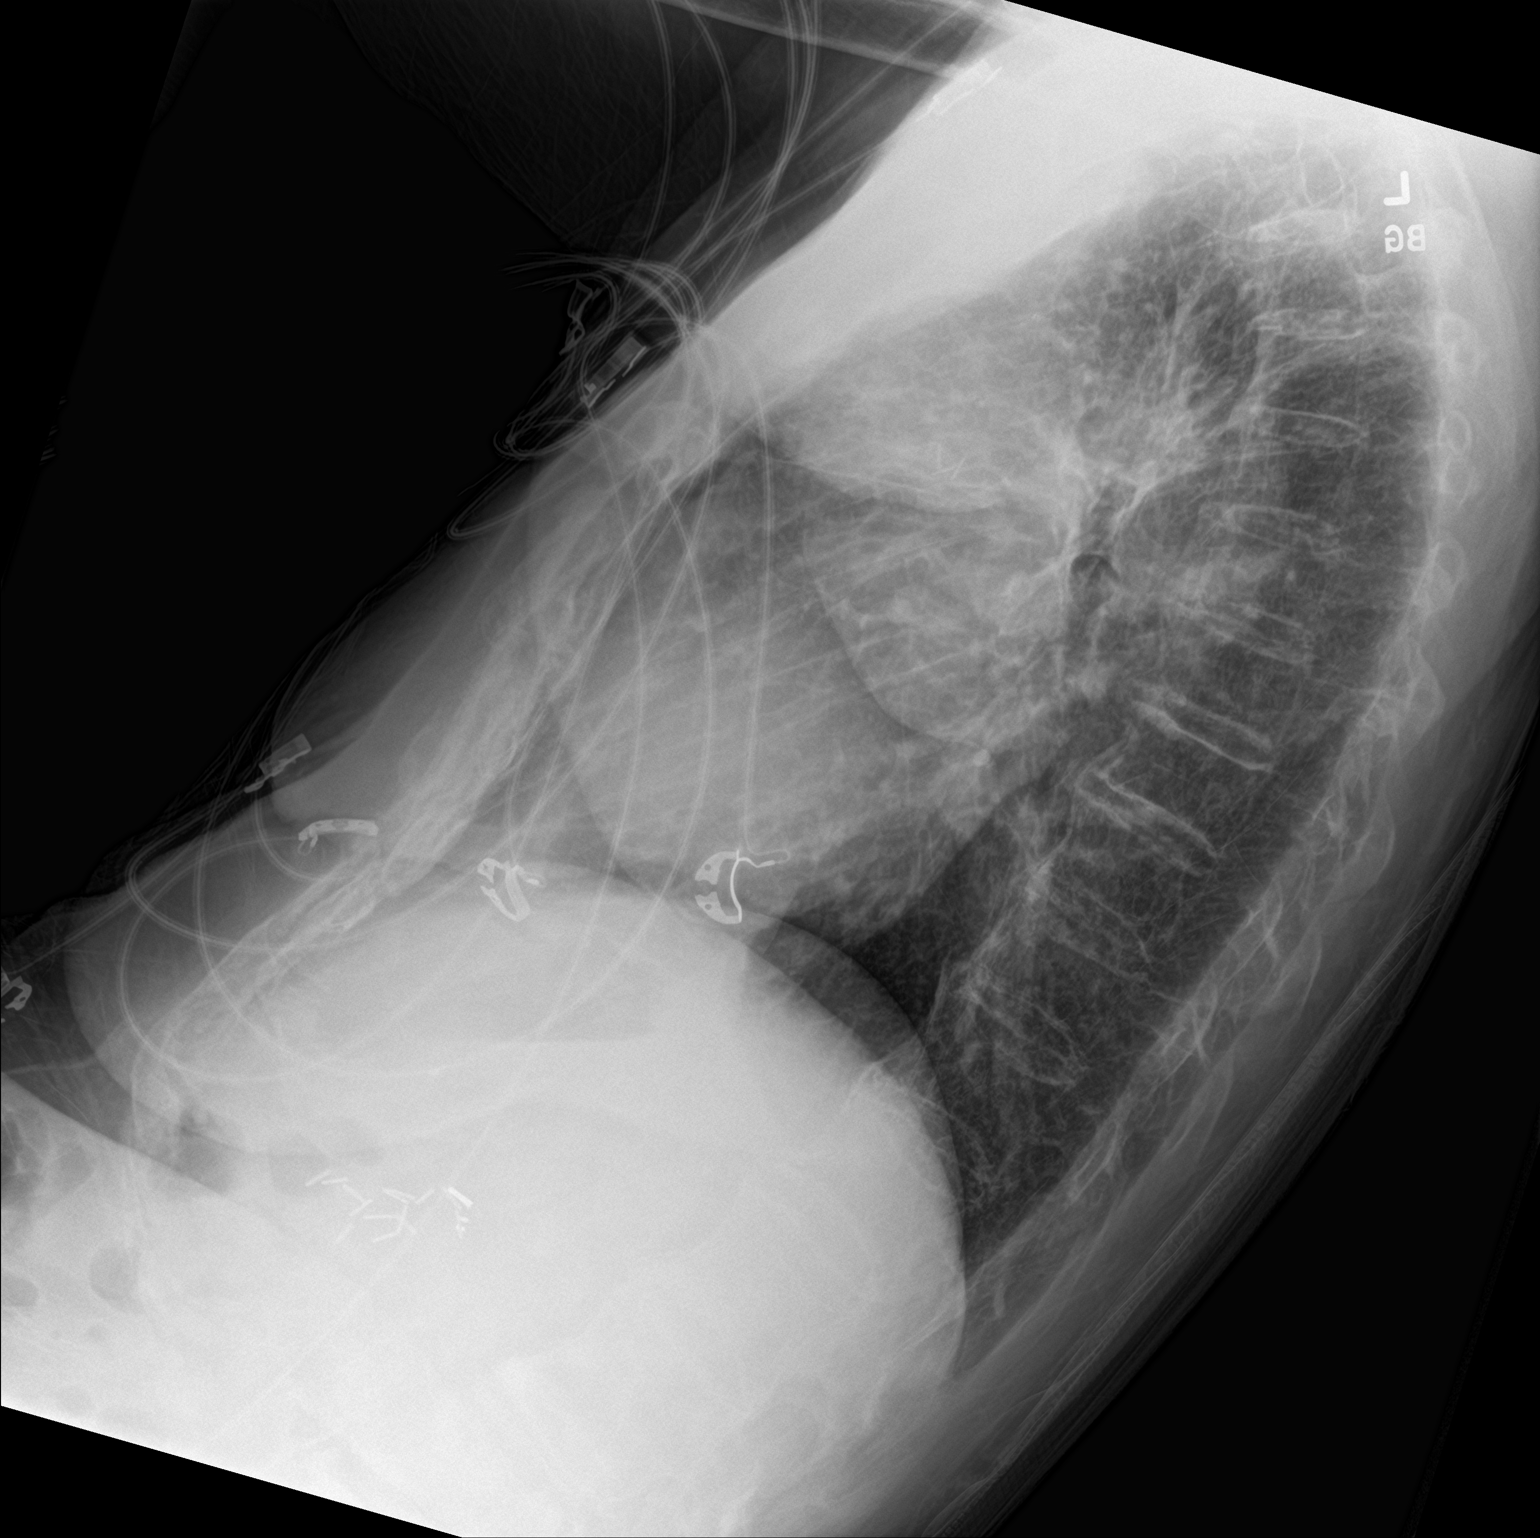

[chest ap]
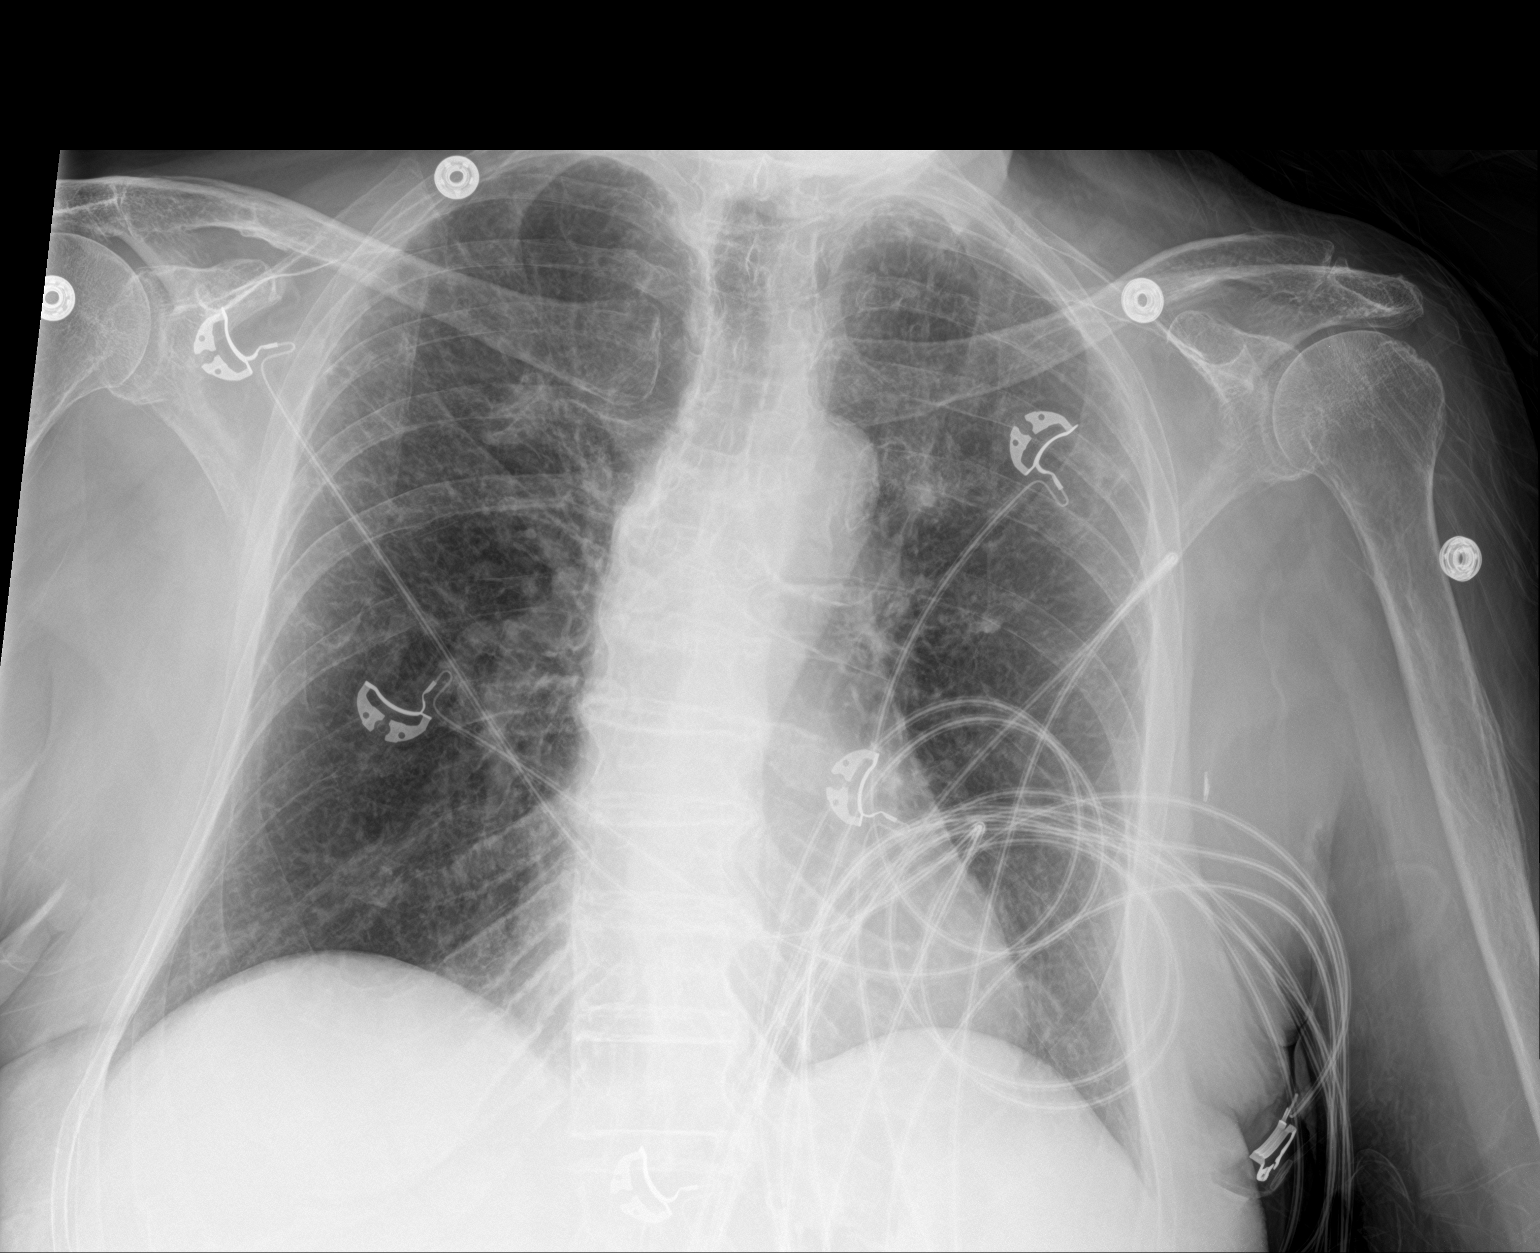

[2 of 2 positions shown; findings below may reference images not displayed]

FINDINGS: There is no focal lung consolidation, pleural effusion or
pneumothorax identified. The cardiomediastinal silhouette is within
normal limits. There is a calcified granuloma in the left mid lung,
unchanged from prior. No acute fractures are seen.
IMPRESSION: 1.
